# Patient Record
Sex: Female | Born: 1935 | Race: White | Hispanic: No | Marital: Married | Smoking: Former smoker
Health system: Southern US, Community
[De-identification: ages and names within clinical notes are randomized; demographics above are authoritative.]

## PROBLEM LIST (undated history)

## (undated) DIAGNOSIS — E785 Hyperlipidemia, unspecified: Secondary | ICD-10-CM

## (undated) DIAGNOSIS — C50919 Malignant neoplasm of unspecified site of unspecified female breast: Secondary | ICD-10-CM

## (undated) DIAGNOSIS — G5600 Carpal tunnel syndrome, unspecified upper limb: Secondary | ICD-10-CM

## (undated) DIAGNOSIS — Z8619 Personal history of other infectious and parasitic diseases: Secondary | ICD-10-CM

## (undated) DIAGNOSIS — K219 Gastro-esophageal reflux disease without esophagitis: Secondary | ICD-10-CM

## (undated) DIAGNOSIS — I10 Essential (primary) hypertension: Secondary | ICD-10-CM

## (undated) DIAGNOSIS — I639 Cerebral infarction, unspecified: Secondary | ICD-10-CM

## (undated) DIAGNOSIS — E039 Hypothyroidism, unspecified: Secondary | ICD-10-CM

## (undated) DIAGNOSIS — I2510 Atherosclerotic heart disease of native coronary artery without angina pectoris: Secondary | ICD-10-CM

## (undated) HISTORY — PX: OTHER SURGICAL HISTORY: SHX169

## (undated) HISTORY — PX: APPENDECTOMY: SHX54

## (undated) HISTORY — PX: ABDOMINAL HYSTERECTOMY: SHX81

---

## 1958-02-17 DIAGNOSIS — C50919 Malignant neoplasm of unspecified site of unspecified female breast: Secondary | ICD-10-CM

## 1958-02-17 HISTORY — DX: Malignant neoplasm of unspecified site of unspecified female breast: C50.919

## 2004-02-07 ENCOUNTER — Ambulatory Visit: Payer: Self-pay | Admitting: Internal Medicine

## 2004-04-12 ENCOUNTER — Ambulatory Visit: Payer: Self-pay | Admitting: Gastroenterology

## 2005-07-14 ENCOUNTER — Ambulatory Visit: Payer: Self-pay | Admitting: Internal Medicine

## 2006-07-23 ENCOUNTER — Encounter: Payer: Self-pay | Admitting: Internal Medicine

## 2006-07-27 ENCOUNTER — Ambulatory Visit: Payer: Self-pay | Admitting: Internal Medicine

## 2007-09-08 ENCOUNTER — Ambulatory Visit: Payer: Self-pay | Admitting: Internal Medicine

## 2008-09-21 ENCOUNTER — Ambulatory Visit: Payer: Self-pay | Admitting: Internal Medicine

## 2009-09-26 ENCOUNTER — Ambulatory Visit: Payer: Self-pay | Admitting: Internal Medicine

## 2009-10-15 ENCOUNTER — Ambulatory Visit: Payer: Self-pay | Admitting: Gastroenterology

## 2010-10-16 ENCOUNTER — Ambulatory Visit: Payer: Self-pay | Admitting: Internal Medicine

## 2011-10-17 ENCOUNTER — Ambulatory Visit: Payer: Self-pay | Admitting: Internal Medicine

## 2012-11-18 ENCOUNTER — Ambulatory Visit: Payer: Self-pay | Admitting: Internal Medicine

## 2013-11-28 DIAGNOSIS — N189 Chronic kidney disease, unspecified: Secondary | ICD-10-CM | POA: Insufficient documentation

## 2013-11-28 DIAGNOSIS — Z8639 Personal history of other endocrine, nutritional and metabolic disease: Secondary | ICD-10-CM | POA: Insufficient documentation

## 2013-11-28 DIAGNOSIS — Z8679 Personal history of other diseases of the circulatory system: Secondary | ICD-10-CM | POA: Insufficient documentation

## 2013-11-28 DIAGNOSIS — K219 Gastro-esophageal reflux disease without esophagitis: Secondary | ICD-10-CM | POA: Insufficient documentation

## 2013-11-28 DIAGNOSIS — E785 Hyperlipidemia, unspecified: Secondary | ICD-10-CM | POA: Insufficient documentation

## 2013-12-27 ENCOUNTER — Ambulatory Visit: Payer: Self-pay | Admitting: Internal Medicine

## 2014-06-05 DIAGNOSIS — E034 Atrophy of thyroid (acquired): Secondary | ICD-10-CM | POA: Insufficient documentation

## 2016-01-31 ENCOUNTER — Encounter (INDEPENDENT_AMBULATORY_CARE_PROVIDER_SITE_OTHER): Payer: Self-pay

## 2016-01-31 ENCOUNTER — Ambulatory Visit (INDEPENDENT_AMBULATORY_CARE_PROVIDER_SITE_OTHER): Payer: Self-pay | Admitting: Vascular Surgery

## 2016-12-05 DIAGNOSIS — Z8659 Personal history of other mental and behavioral disorders: Secondary | ICD-10-CM | POA: Insufficient documentation

## 2017-10-12 ENCOUNTER — Observation Stay (HOSPITAL_COMMUNITY): Payer: Medicare PPO

## 2017-10-12 ENCOUNTER — Inpatient Hospital Stay (HOSPITAL_COMMUNITY)
Admission: EM | Admit: 2017-10-12 | Discharge: 2017-10-15 | DRG: 065 | Disposition: A | Discharge status: Skilled Nursing Facility | Payer: Medicare PPO | Attending: Family Medicine | Admitting: Family Medicine

## 2017-10-12 ENCOUNTER — Other Ambulatory Visit: Payer: Self-pay

## 2017-10-12 ENCOUNTER — Encounter (HOSPITAL_COMMUNITY): Payer: Self-pay | Admitting: Emergency Medicine

## 2017-10-12 ENCOUNTER — Emergency Department (HOSPITAL_COMMUNITY): Payer: Medicare PPO

## 2017-10-12 DIAGNOSIS — Z888 Allergy status to other drugs, medicaments and biological substances status: Secondary | ICD-10-CM

## 2017-10-12 DIAGNOSIS — I129 Hypertensive chronic kidney disease with stage 1 through stage 4 chronic kidney disease, or unspecified chronic kidney disease: Secondary | ICD-10-CM | POA: Diagnosis present

## 2017-10-12 DIAGNOSIS — I6340 Cerebral infarction due to embolism of unspecified cerebral artery: Secondary | ICD-10-CM

## 2017-10-12 DIAGNOSIS — N183 Chronic kidney disease, stage 3 (moderate): Secondary | ICD-10-CM | POA: Diagnosis present

## 2017-10-12 DIAGNOSIS — R296 Repeated falls: Secondary | ICD-10-CM | POA: Diagnosis present

## 2017-10-12 DIAGNOSIS — Z7982 Long term (current) use of aspirin: Secondary | ICD-10-CM

## 2017-10-12 DIAGNOSIS — R402252 Coma scale, best verbal response, oriented, at arrival to emergency department: Secondary | ICD-10-CM | POA: Diagnosis present

## 2017-10-12 DIAGNOSIS — R748 Abnormal levels of other serum enzymes: Secondary | ICD-10-CM

## 2017-10-12 DIAGNOSIS — G8194 Hemiplegia, unspecified affecting left nondominant side: Secondary | ICD-10-CM | POA: Diagnosis present

## 2017-10-12 DIAGNOSIS — I10 Essential (primary) hypertension: Secondary | ICD-10-CM | POA: Diagnosis not present

## 2017-10-12 DIAGNOSIS — I63513 Cerebral infarction due to unspecified occlusion or stenosis of bilateral middle cerebral arteries: Secondary | ICD-10-CM | POA: Diagnosis not present

## 2017-10-12 DIAGNOSIS — R0989 Other specified symptoms and signs involving the circulatory and respiratory systems: Secondary | ICD-10-CM | POA: Diagnosis present

## 2017-10-12 DIAGNOSIS — I160 Hypertensive urgency: Secondary | ICD-10-CM | POA: Diagnosis present

## 2017-10-12 DIAGNOSIS — I272 Pulmonary hypertension, unspecified: Secondary | ICD-10-CM | POA: Diagnosis present

## 2017-10-12 DIAGNOSIS — R29703 NIHSS score 3: Secondary | ICD-10-CM | POA: Diagnosis present

## 2017-10-12 DIAGNOSIS — R402142 Coma scale, eyes open, spontaneous, at arrival to emergency department: Secondary | ICD-10-CM | POA: Diagnosis present

## 2017-10-12 DIAGNOSIS — I639 Cerebral infarction, unspecified: Secondary | ICD-10-CM | POA: Diagnosis not present

## 2017-10-12 DIAGNOSIS — Z79890 Hormone replacement therapy (postmenopausal): Secondary | ICD-10-CM

## 2017-10-12 DIAGNOSIS — I248 Other forms of acute ischemic heart disease: Secondary | ICD-10-CM | POA: Diagnosis present

## 2017-10-12 DIAGNOSIS — R778 Other specified abnormalities of plasma proteins: Secondary | ICD-10-CM | POA: Diagnosis present

## 2017-10-12 DIAGNOSIS — R402362 Coma scale, best motor response, obeys commands, at arrival to emergency department: Secondary | ICD-10-CM | POA: Diagnosis present

## 2017-10-12 DIAGNOSIS — R1310 Dysphagia, unspecified: Secondary | ICD-10-CM | POA: Diagnosis present

## 2017-10-12 DIAGNOSIS — E039 Hypothyroidism, unspecified: Secondary | ICD-10-CM | POA: Diagnosis present

## 2017-10-12 DIAGNOSIS — I2510 Atherosclerotic heart disease of native coronary artery without angina pectoris: Secondary | ICD-10-CM | POA: Diagnosis present

## 2017-10-12 DIAGNOSIS — R531 Weakness: Secondary | ICD-10-CM

## 2017-10-12 DIAGNOSIS — F419 Anxiety disorder, unspecified: Secondary | ICD-10-CM | POA: Diagnosis present

## 2017-10-12 DIAGNOSIS — Z8249 Family history of ischemic heart disease and other diseases of the circulatory system: Secondary | ICD-10-CM

## 2017-10-12 DIAGNOSIS — R7989 Other specified abnormal findings of blood chemistry: Secondary | ICD-10-CM | POA: Diagnosis present

## 2017-10-12 DIAGNOSIS — Z79899 Other long term (current) drug therapy: Secondary | ICD-10-CM

## 2017-10-12 HISTORY — DX: Essential (primary) hypertension: I10

## 2017-10-12 LAB — DIFFERENTIAL
Abs Immature Granulocytes: 0 10*3/uL (ref 0.0–0.1)
BASOS ABS: 0.1 10*3/uL (ref 0.0–0.1)
Basophils Relative: 1 %
EOS PCT: 1 %
Eosinophils Absolute: 0.2 10*3/uL (ref 0.0–0.7)
Immature Granulocytes: 0 %
LYMPHS PCT: 13 %
Lymphs Abs: 1.6 10*3/uL (ref 0.7–4.0)
MONO ABS: 0.9 10*3/uL (ref 0.1–1.0)
MONOS PCT: 7 %
NEUTROS PCT: 78 %
Neutro Abs: 9.8 10*3/uL — ABNORMAL HIGH (ref 1.7–7.7)

## 2017-10-12 LAB — PROTIME-INR
INR: 1.03
Prothrombin Time: 13.4 seconds (ref 11.4–15.2)

## 2017-10-12 LAB — COMPREHENSIVE METABOLIC PANEL
ALT: 17 U/L (ref 0–44)
ANION GAP: 9 (ref 5–15)
AST: 27 U/L (ref 15–41)
Albumin: 3.5 g/dL (ref 3.5–5.0)
Alkaline Phosphatase: 74 U/L (ref 38–126)
BILIRUBIN TOTAL: 1.1 mg/dL (ref 0.3–1.2)
BUN: 19 mg/dL (ref 8–23)
CO2: 26 mmol/L (ref 22–32)
Calcium: 9.2 mg/dL (ref 8.9–10.3)
Chloride: 106 mmol/L (ref 98–111)
Creatinine, Ser: 1 mg/dL (ref 0.44–1.00)
GFR, EST AFRICAN AMERICAN: 59 mL/min — AB (ref >60)
GFR, EST NON AFRICAN AMERICAN: 51 mL/min — AB (ref >60)
Glucose, Bld: 112 mg/dL — ABNORMAL HIGH (ref 70–99)
Potassium: 4.3 mmol/L (ref 3.5–5.1)
SODIUM: 141 mmol/L (ref 135–145)
TOTAL PROTEIN: 6.7 g/dL (ref 6.5–8.1)

## 2017-10-12 LAB — URINALYSIS, ROUTINE W REFLEX MICROSCOPIC
Bilirubin Urine: NEGATIVE
Glucose, UA: NEGATIVE mg/dL
Hgb urine dipstick: NEGATIVE
Ketones, ur: NEGATIVE mg/dL
LEUKOCYTES UA: NEGATIVE
Nitrite: NEGATIVE
Protein, ur: NEGATIVE mg/dL
SPECIFIC GRAVITY, URINE: 1.009 (ref 1.005–1.030)
pH: 8 (ref 5.0–8.0)

## 2017-10-12 LAB — CBC
HEMATOCRIT: 45.5 % (ref 36.0–46.0)
Hemoglobin: 13.9 g/dL (ref 12.0–15.0)
MCH: 28 pg (ref 26.0–34.0)
MCHC: 30.5 g/dL (ref 30.0–36.0)
MCV: 91.7 fL (ref 78.0–100.0)
Platelets: 221 10*3/uL (ref 150–400)
RBC: 4.96 MIL/uL (ref 3.87–5.11)
RDW: 13.3 % (ref 11.5–15.5)
WBC: 12.7 10*3/uL — ABNORMAL HIGH (ref 4.0–10.5)

## 2017-10-12 LAB — I-STAT TROPONIN, ED: TROPONIN I, POC: 0.1 ng/mL — AB (ref 0.00–0.08)

## 2017-10-12 LAB — RAPID URINE DRUG SCREEN, HOSP PERFORMED
Amphetamines: NOT DETECTED
BARBITURATES: NOT DETECTED
Benzodiazepines: NOT DETECTED
Cocaine: NOT DETECTED
Opiates: NOT DETECTED
Tetrahydrocannabinol: NOT DETECTED

## 2017-10-12 LAB — APTT: APTT: 30 s (ref 24–36)

## 2017-10-12 LAB — ETHANOL

## 2017-10-12 MED ORDER — SENNOSIDES-DOCUSATE SODIUM 8.6-50 MG PO TABS: 1.0000 | ORAL_TABLET | Freq: Every evening | ORAL | Status: DC | PRN

## 2017-10-12 MED ORDER — ACETAMINOPHEN 650 MG RE SUPP: 650.0000 mg | RECTAL | Status: DC | PRN

## 2017-10-12 MED ORDER — LABETALOL HCL 5 MG/ML IV SOLN: 5.0000 mg | INTRAVENOUS | Status: DC | PRN

## 2017-10-12 MED ORDER — SIMVASTATIN 40 MG PO TABS: 40.0000 mg | ORAL_TABLET | Freq: Every day | ORAL | Status: DC

## 2017-10-12 MED ORDER — STROKE: EARLY STAGES OF RECOVERY BOOK
Freq: Once | Status: AC
  Administered 2017-10-13
  Filled 2017-10-12 (×2): qty 1

## 2017-10-12 MED ORDER — VITAMIN D 1000 UNITS PO TABS
3000.0000 [IU] | ORAL_TABLET | Freq: Every day | ORAL | Status: DC
  Administered 2017-10-13 – 2017-10-14 (×2): 3000 [IU] via ORAL
  Filled 2017-10-12 (×2): qty 3

## 2017-10-12 MED ORDER — LEVOTHYROXINE SODIUM 25 MCG PO TABS
25.0000 ug | ORAL_TABLET | Freq: Every day | ORAL | Status: DC
  Administered 2017-10-13 – 2017-10-15 (×3): 25 ug via ORAL
  Filled 2017-10-12 (×3): qty 1

## 2017-10-12 MED ORDER — PANTOPRAZOLE SODIUM 20 MG PO TBEC
20.0000 mg | DELAYED_RELEASE_TABLET | Freq: Every day | ORAL | Status: DC
  Administered 2017-10-13 – 2017-10-15 (×3): 20 mg via ORAL
  Filled 2017-10-12 (×3): qty 1

## 2017-10-12 MED ORDER — ASPIRIN 325 MG PO TABS
325.0000 mg | ORAL_TABLET | Freq: Every day | ORAL | Status: DC
  Administered 2017-10-13 – 2017-10-15 (×3): 325 mg via ORAL
  Filled 2017-10-12 (×3): qty 1

## 2017-10-12 MED ORDER — VENLAFAXINE HCL ER 150 MG PO CP24
150.0000 mg | ORAL_CAPSULE | Freq: Every day | ORAL | Status: DC
  Administered 2017-10-13 – 2017-10-14 (×2): 150 mg via ORAL
  Filled 2017-10-12: qty 1
  Filled 2017-10-12 (×2): qty 2
  Filled 2017-10-12 (×2): qty 1

## 2017-10-12 MED ORDER — BACITRACIN-NEOMYCIN-POLYMYXIN OINTMENT TUBE
1.0000 "application " | TOPICAL_OINTMENT | Freq: Every day | CUTANEOUS | Status: DC
  Administered 2017-10-13 – 2017-10-15 (×3): 1 via TOPICAL
  Filled 2017-10-12 (×2): qty 14

## 2017-10-12 MED ORDER — LABETALOL HCL 5 MG/ML IV SOLN
10.0000 mg | Freq: Once | INTRAVENOUS | Status: AC
  Administered 2017-10-12: 10 mg via INTRAVENOUS
  Filled 2017-10-12: qty 4

## 2017-10-12 MED ORDER — ACETAMINOPHEN 325 MG PO TABS
650.0000 mg | ORAL_TABLET | ORAL | Status: DC | PRN
  Administered 2017-10-13: 650 mg via ORAL
  Filled 2017-10-12 (×3): qty 2

## 2017-10-12 MED ORDER — SODIUM CHLORIDE 0.9 % IV SOLN
INTRAVENOUS | Status: DC
  Administered 2017-10-12: via INTRAVENOUS

## 2017-10-12 MED ORDER — ACETAMINOPHEN 160 MG/5ML PO SOLN: 650.0000 mg | ORAL | Status: DC | PRN

## 2017-10-12 MED ORDER — LABETALOL HCL 5 MG/ML IV SOLN: 20.0000 mg | Freq: Once | INTRAVENOUS | Status: DC

## 2017-10-12 MED ORDER — ASPIRIN 300 MG RE SUPP: 300.0000 mg | Freq: Every day | RECTAL | Status: DC

## 2017-10-12 MED ORDER — ENOXAPARIN SODIUM 40 MG/0.4ML ~~LOC~~ SOLN
40.0000 mg | SUBCUTANEOUS | Status: DC
  Administered 2017-10-13 – 2017-10-14 (×3): 40 mg via SUBCUTANEOUS
  Filled 2017-10-12 (×3): qty 0.4

## 2017-10-12 NOTE — ED Notes (Signed)
Patient transported to CT 

## 2017-10-12 NOTE — ED Triage Notes (Signed)
Per EMS, pt fell out of bed around 0730 and hit her head and left elbow. Around 1330 pt started to experience left sided facial; droop and slurred speech. Pt stated that she has been generally weak over the last week. Pt is hypertensive open arrival 252/113

## 2017-10-12 NOTE — ED Notes (Signed)
Spoke to MD Opyd about keeping pt's BP high due to possibility of acute stroke. No new orders for BP meds at this time.

## 2017-10-12 NOTE — H&P (Signed)
History and Physical    Aleyssa Pike TZG:[id number] DOB: 1932-03-26 DOA: 10/12/2017  PCP: Baxter Hire, MD   Patient coming from: Home   Chief Complaint: Left-sided weakness   HPI: Svetlana Bagby is a 84 y.o. female with medical history significant for hypothyroidism, hypertension, and anxiety, now presenting to the emergency department with left-sided weakness.  Patient reports that she has been generally weak and fatigued for the past week or so, but was not noted to have a left facial droop or focal weakness until earlier today.  Family had noted at approximately 1:30 PM that the patient had a left facial droop with dysarthria and appeared to be dragging her left leg.  Patient denies any chest pain, palpitations, or shortness of breath.  She had a fall out of her bed early this morning without any significant injury or loss of consciousness.  ED Course: Upon arrival to the ED, patient is found to be afebrile, saturating well on room air, hypertensive to 210/80, and vitals otherwise normal. EKG features a sinus rhythm and noncontrast head CT is negative for acute intracranial abnormality. Chemistry panel is unremarkable and CBC is notable for a leukocytosis to 12,700. Troponin is elevated to 0.10. Patient was given a dose of IV labetalol in the ED and neurology was consulted by the ED physician. Patient will be observed for ongoing evaluation and management of left-sided weakness suspected secondary to stroke.  Review of Systems:  All other systems reviewed and apart from HPI, are negative.  Past Medical History:  Diagnosis Date  . Hypertension     History reviewed. No pertinent surgical history.   has no tobacco, alcohol, and drug history on file.  Allergies  Allergen Reactions  . Clopidogrel Nausea And Vomiting     reported by [place] 11/24/13  . Omeprazole Other (See Comments)    Unknown reaction - reported by [place] 11/24/13     Family History  Problem Relation Age of Onset  . Hypertension Son      Prior to Admission medications   Medication Sig Start Date End Date Taking? Authorizing Provider  aspirin EC 81 MG tablet Take 162 mg by mouth at bedtime.    Yes [provider]  b complex vitamins tablet Take 1 tablet by mouth daily.   Yes [provider]  benazepril (LOTENSIN) 20 MG tablet Take 20 mg by mouth at bedtime.  09/10/17  Yes [provider]  Cholecalciferol (VITAMIN D3) 1000 units CAPS Take 3,000 Units by mouth at bedtime.  09/07/17  Yes [provider]  cloNIDine (CATAPRES) 0.1 MG tablet Take 0.2 mg by mouth at bedtime.  09/10/17  Yes [provider]  Coenzyme Q10 (COQ10) 100 MG CAPS Take 100 mg by mouth daily.    Yes [provider]  ibuprofen (ADVIL,MOTRIN) 200 MG tablet Take 800 mg by mouth every 6 (six) hours as needed (back pain).    Yes [provider]  lansoprazole (PREVACID) 15 MG capsule Take 15 mg by mouth at bedtime.    Yes [provider]  levothyroxine (SYNTHROID, LEVOTHROID) 25 MCG tablet Take 25 mcg by mouth daily before breakfast.  09/10/17  Yes [provider]  metoprolol succinate (TOPROL-XL) 100 MG 24 hr tablet Take 100 mg by mouth at bedtime.  09/10/17  Yes [provider]  neomycin-bacitracin-polymyxin (NEOSPORIN) [id number] ointment Apply 1 application topically daily. Apply to wounds on legs   Yes [provider]  Phenazopyridine HCl (AZO  STANDARD MAXIMUM STRENGTH PO) Take 1 capsule by mouth at bedtime.    Yes [provider]  simvastatin (ZOCOR) 40 MG tablet Take 40 mg by mouth at bedtime. 09/10/17  Yes [provider]  venlafaxine XR (EFFEXOR-XR) 150 MG 24 hr capsule Take 150 mg by mouth at bedtime. 09/10/17  Yes [provider]    Physical Exam: Vitals:   10/12/17 2052 10/12/17 2100 10/12/17 2210 10/12/17 2215  BP: (!) 188/97 (!) 211/83 (!) 217/70 (!) 213/91   Pulse: 87 88 81 80  Resp: (!) 23 20 18 19   Temp:      TempSrc:      SpO2: 95% 97% 97% 97%  Weight:      Height:          Constitutional: NAD, calm  Eyes: PERTLA, lids and conjunctivae normal ENMT: Mucous membranes are moist. Posterior pharynx clear of any exudate or lesions.   Neck: normal, supple, no masses, no thyromegaly Respiratory: clear to auscultation bilaterally, no wheezing, no crackles. Normal respiratory effort.   Cardiovascular: S1 & S2 heard, regular rate and rhythm. No extremity edema.   Abdomen: No distension, no tenderness, soft. Bowel sounds normal.  Musculoskeletal: no clubbing / cyanosis. No joint deformity upper and lower extremities.    Skin: Skin atrophy and hyperpigmentation in distal LE's bilateral with chronic ulcerations, Lt >Rt. Warm, dry, well-perfused. Neurologic: Left lower facial weakness, PERRL, EOMI. Strength 5/5 throughout RUE and RLE, 2-3/5 in left arm, 3-4/5 involving LLE.  Psychiatric: Alert and oriented x 3. Pleasant, cooperative.     Labs on Admission: I have personally reviewed following labs and imaging studies  CBC: Recent Labs  Lab 10/12/17 2034  WBC 12.7*  NEUTROABS 9.8*  HGB 13.9  HCT 45.5  MCV 91.7  PLT 361   Basic Metabolic Panel: Recent Labs  Lab 10/12/17 2034  NA 141  K 4.3  CL 106  CO2 26  GLUCOSE 112*  BUN 19  CREATININE 1.00  CALCIUM 9.2   GFR: Estimated Creatinine Clearance: 33.8 mL/min (by C-G formula based on SCr of 1 mg/dL). Liver Function Tests: Recent Labs  Lab 10/12/17 2034  AST 27  ALT 17  ALKPHOS 74  BILITOT 1.1  PROT 6.7  ALBUMIN 3.5   No results for input(s): LIPASE, AMYLASE in the last 168 hours. No results for input(s): AMMONIA in the last 168 hours. Coagulation Profile: Recent Labs  Lab 10/12/17 2034  INR 1.03   Cardiac Enzymes: No results for input(s): CKTOTAL, CKMB, CKMBINDEX, TROPONINI in the last 168 hours. BNP (last 3 results) No results for input(s): PROBNP in the last  8760 hours. HbA1C: No results for input(s): HGBA1C in the last 72 hours. CBG: No results for input(s): GLUCAP in the last 168 hours. Lipid Profile: No results for input(s): CHOL, HDL, LDLCALC, TRIG, CHOLHDL, LDLDIRECT in the last 72 hours. Thyroid Function Tests: No results for input(s): TSH, T4TOTAL, FREET4, T3FREE, THYROIDAB in the last 72 hours. Anemia Panel: No results for input(s): VITAMINB12, FOLATE, FERRITIN, TIBC, IRON, RETICCTPCT in the last 72 hours. Urine analysis:    Component Value Date/Time   COLORURINE STRAW (A) 10/12/2017 [place] 10/12/2017 2203   LABSPEC 1.009 10/12/2017 2203   PHURINE 8.0 10/12/2017 [place] 10/12/2017 2203   HGBUR NEGATIVE 10/12/2017 2203   BILIRUBINUR NEGATIVE 10/12/2017 2203   KETONESUR NEGATIVE 10/12/2017 2203   PROTEINUR NEGATIVE 10/12/2017 2203   NITRITE NEGATIVE 10/12/2017 2203   LEUKOCYTESUR NEGATIVE 10/12/2017 2203  Sepsis Labs: @LABRCNTIP (FFMBWGYKZLDJT:7,SVXBLTJQZES:9) )No results found for this or any previous visit (from the past 240 hour(s)).   Radiological Exams on Admission: Ct Head Wo Contrast  Result Date: 10/12/2017 CLINICAL DATA:  Hit head.  Fall. EXAM: CT HEAD WITHOUT CONTRAST TECHNIQUE: Contiguous axial images were obtained from the base of the skull through the vertex without intravenous contrast. COMPARISON:  None. FINDINGS: Brain: There is atrophy and chronic small vessel disease changes. No acute intracranial abnormality. Specifically, no hemorrhage, hydrocephalus, mass lesion, acute infarction, or significant intracranial injury. Vascular: No hyperdense vessel or unexpected calcification. Skull: No acute calvarial abnormality. Sinuses/Orbits: Visualized paranasal sinuses and mastoids clear. Orbital soft tissues unremarkable. Other: None IMPRESSION: No acute intracranial abnormality. Atrophy, chronic microvascular disease. Electronically Signed   By: Rolm Baptise M.D.   On: 10/12/2017 20:53     EKG: Independently reviewed. Sinus rhythm.   Assessment/Plan   1. Suspected stroke; left-sided weakness  - Presents with left-sided weakness that developed some time today before 13:30 per family, thought she has had generalized weakness and fatigue for the past week  - Head CT is negative for acute intracranial abnormality  - Neurology is consulting and much appreciated, will follow-up on recommendations  - Continue cardiac monitoring with frequent neuro checks, PT/OT/SLP evals, swallow screen  - Check MRI brain, A1c, and fasting lipids  - Continue ASA and statin   2. Hypertension with hypertensive urgency  - SBP elevated to low 200's in ED  - She is managed with benazepril, Toprol, and clonidine at home  - Plan to permit HTN for now in light of suspicion for acute ischemic CVA, treat pressures >220/120    3. Elevated troponin  - Troponin is elevated to 0.10 in ED - No chest pain, no acute ischemic features appreciated on EKG  - Continue cardiac monitoring, trend troponin, continue ASA and statin    4. Hypothyroidism  - Check TSH given reports of gen weakness and fatigue for past week  - Continue Synthroid    5. Anxiety  - Continue Effexor    DVT prophylaxis: Lovenox  Code Status: Full  Family Communication: Son and husband updated at bedside Consults called: Neurology  Admission status: Observation     Vianne Bulls, MD [facility] Pager [phone]  If 7PM-7AM, please contact night-coverage [email or web address] Password [facility]  10/12/2017, 10:44 PM

## 2017-10-12 NOTE — ED Provider Notes (Signed)
[facility] EMERGENCY DEPARTMENT Provider Note   CSN: [id number] Arrival date & time:        History   Chief Complaint Chief Complaint  Patient presents with  . Stroke Symptoms    HPI Sonya [name] is a 84 y.o. female.  HPI Patient presented to the emergency room for evaluation of weakness, fatigue.  Patient states over the last couple of days she has had some episodes off and on and feeling very fatigued and weak all over.  Patient fell out of bed today at about 730.  She states she hit her head and her elbow but she was unable to get up out of bed and required her husband to assist her.  According to the EMS report the patient started having a left-sided facial droop and slurred speech at about 1:30 PM today.  Patient herself states her symptoms started over the last couple days and she does not specifically remember having any acute change at 130 this afternoon.  Currently she does not have any trouble with chest pain or shortness of breath. Past Medical History:  Diagnosis Date  . Hypertension     Patient Active Problem List   Diagnosis Date Noted  . Hypertension 10/12/2017  . Hypertensive urgency 10/12/2017  . Hypothyroidism 10/12/2017  . Left-sided weakness 10/12/2017  . Elevated troponin 10/12/2017  . Unknown when suspected stroke patient was last well 10/12/2017    History reviewed. No pertinent surgical history.   OB History   None      Home Medications    Prior to Admission medications   Medication Sig Start Date End Date Taking? Authorizing Provider  aspirin EC 81 MG tablet Take 162 mg by mouth at bedtime.    Yes [provider]  b complex vitamins tablet Take 1 tablet by mouth daily.   Yes [provider]  benazepril (LOTENSIN) 20 MG tablet Take 20 mg by mouth at bedtime.  09/10/17  Yes [provider]  Cholecalciferol (VITAMIN D3) 1000 units CAPS Take 3,000 Units by mouth at bedtime.  09/07/17  Yes [provider]  cloNIDine (CATAPRES) 0.1 MG tablet Take 0.2 mg by mouth at bedtime.  09/10/17  Yes [provider]  Coenzyme Q10 (COQ10) 100 MG CAPS Take 100 mg by mouth daily.    Yes [provider]  ibuprofen (ADVIL,MOTRIN) 200 MG tablet Take 800 mg by mouth every 6 (six) hours as needed (back pain).    Yes [provider]  lansoprazole (PREVACID) 15 MG capsule Take 15 mg by mouth at bedtime.    Yes [provider]  levothyroxine (SYNTHROID, LEVOTHROID) 25 MCG tablet Take 25 mcg by mouth daily before breakfast.  09/10/17  Yes [provider]  metoprolol succinate (TOPROL-XL) 100 MG 24 hr tablet Take 100 mg by mouth at bedtime.  09/10/17  Yes [provider]  neomycin-bacitracin-polymyxin (NEOSPORIN) [phone] ointment Apply 1 application topically daily. Apply to wounds on legs   Yes [provider]  Phenazopyridine HCl (AZO STANDARD MAXIMUM STRENGTH PO) Take 1 capsule by mouth at bedtime.    Yes [provider]  simvastatin (ZOCOR) 40 MG tablet Take 40 mg by mouth at bedtime. 09/10/17  Yes [provider]  venlafaxine XR (EFFEXOR-XR) 150 MG 24 hr capsule Take 150 mg by mouth at bedtime. 09/10/17  Yes [provider]    Family History History reviewed. No pertinent family history.  Social History Social History   Tobacco Use  .  Smoking status: Not on file  Substance Use Topics  . Alcohol use: Not on file  . Drug use: Not on file     Allergies   Clopidogrel and Omeprazole   Review of Systems Review of Systems  All other systems reviewed and are negative.    Physical Exam Updated Vital Signs BP (!) 211/83   Pulse 88   Temp 97.9 F (36.6 C) (Oral)   Resp 20   Ht 1.524 m (5')   Wt 57.2 kg   LMP  (Exact Date)   SpO2 97%   BMI 24.61 kg/m   Physical Exam  Constitutional: She is oriented to person, place, and time. She appears well-developed and well-nourished. No distress.  HENT:    Head: Normocephalic and atraumatic.  Right Ear: External ear normal.  Left Ear: External ear normal.  Mouth/Throat: Oropharynx is clear and moist.  Eyes: Conjunctivae are normal. Right eye exhibits no discharge. Left eye exhibits no discharge. No scleral icterus.  Neck: Neck supple. No tracheal deviation present.  Cardiovascular: Normal rate, regular rhythm and intact distal pulses.  Pulmonary/Chest: Effort normal and breath sounds normal. No stridor. No respiratory distress. She has no wheezes. She has no rales.  Abdominal: Soft. Bowel sounds are normal. She exhibits no distension. There is no tenderness. There is no rebound and no guarding.  Musculoskeletal: She exhibits no edema or tenderness.  Neurological: She is alert and oriented to person, place, and time. A cranial nerve deficit (Sided facial droop, extraocular movements intact, ) is present. No sensory deficit. She exhibits normal muscle tone. She displays no seizure activity.  Weakness in the left upper extremity compared to the right, difficulty holding the left leg off bed for 5 seconds, sensation intact in all extremities, no visual field cuts, no left or right sided neglect, normal finger-nose exam bilaterally, no nystagmus noted   Skin: Skin is warm and dry. No rash noted.  Psychiatric: She has a normal mood and affect.  Nursing note and vitals reviewed.    ED Treatments / Results  Labs (all labs ordered are listed, but only abnormal results are displayed) Labs Reviewed  CBC - Abnormal; Notable for the following components:      Result Value   WBC 12.7 (*)    All other components within normal limits  DIFFERENTIAL - Abnormal; Notable for the following components:   Neutro Abs 9.8 (*)    All other components within normal limits  COMPREHENSIVE METABOLIC PANEL - Abnormal; Notable for the following components:   Glucose, Bld 112 (*)    GFR calc non Af Amer 51 (*)    GFR calc Af Amer 59 (*)    All other components  within normal limits  I-STAT TROPONIN, ED - Abnormal; Notable for the following components:   Troponin i, poc 0.10 (*)    All other components within normal limits  ETHANOL  PROTIME-INR  APTT  RAPID URINE DRUG SCREEN, HOSP PERFORMED  URINALYSIS, ROUTINE W REFLEX MICROSCOPIC    EKG EKG Interpretation  Date/Time:  Monday October 12 2017 19:53:15 EDT Ventricular Rate:  91 PR Interval:    QRS Duration: 71 QT Interval:  371 QTC Calculation: 457 R Axis:   -13 Text Interpretation:  Sinus rhythm Left atrial enlargement Minimal ST depression, lateral leads No old tracing to compare Confirmed by Dorie Rank [phone]) on 10/12/2017 9:39:37 PM   Radiology Ct Head Wo Contrast  Result Date: 10/12/2017 CLINICAL DATA:  Hit head.  Fall. EXAM: CT HEAD  WITHOUT CONTRAST TECHNIQUE: Contiguous axial images were obtained from the base of the skull through the vertex without intravenous contrast. COMPARISON:  None. FINDINGS: Brain: There is atrophy and chronic small vessel disease changes. No acute intracranial abnormality. Specifically, no hemorrhage, hydrocephalus, mass lesion, acute infarction, or significant intracranial injury. Vascular: No hyperdense vessel or unexpected calcification. Skull: No acute calvarial abnormality. Sinuses/Orbits: Visualized paranasal sinuses and mastoids clear. Orbital soft tissues unremarkable. Other: None IMPRESSION: No acute intracranial abnormality. Atrophy, chronic microvascular disease. Electronically Signed   By: Rolm Baptise M.D.   On: 10/12/2017 20:53    Procedures .Critical Care Performed by: Dorie Rank, MD Authorized by: Dorie Rank, MD   Critical care provider statement:    Critical care time (minutes):  45   Critical care was time spent personally by me on the following activities:  Discussions with consultants, evaluation of patient's response to treatment, examination of patient, ordering and performing treatments and interventions, ordering and review of  laboratory studies, ordering and review of radiographic studies, pulse oximetry, re-evaluation of patient's condition, obtaining history from patient or surrogate and review of old charts   (including critical care time)  Medications Ordered in ED Medications  labetalol (NORMODYNE,TRANDATE) injection 10 mg (10 mg Intravenous Given 10/12/17 2051)     Initial Impression / Assessment and Plan / ED Course  I have reviewed the triage vital signs and the nursing notes.  Pertinent labs & imaging results that were available during my care of the patient were reviewed by me and considered in my medical decision making (see chart for details).   Patient presented to the emergency room for evaluation of a possible stroke.  The exact onset is unclear.  Patient mentions having symptoms for the last few days.  Patient is outside of TPA window.  She has been negative.  Labs notable for a slight increase in troponin but no complaints of chest pain or symptoms to suggest acute cardiac injury.  Patient is hypertensive.  I gave her a dose of labetalol but will hold off on aggressive measures right now as of the acute stroke concern.  We will consult the medical service for admission and consult with neuro hospitalist.  Final Clinical Impressions(s) / ED Diagnoses   Final diagnoses:  Cerebrovascular accident (CVA), unspecified mechanism ([place])  Hypertension, unspecified type      Dorie Rank, MD 10/12/17 2208

## 2017-10-12 NOTE — ED Notes (Signed)
ED Provider at bedside. 

## 2017-10-13 ENCOUNTER — Observation Stay (HOSPITAL_COMMUNITY): Payer: Medicare PPO

## 2017-10-13 DIAGNOSIS — R0989 Other specified symptoms and signs involving the circulatory and respiratory systems: Secondary | ICD-10-CM | POA: Diagnosis not present

## 2017-10-13 DIAGNOSIS — R402142 Coma scale, eyes open, spontaneous, at arrival to emergency department: Secondary | ICD-10-CM | POA: Diagnosis present

## 2017-10-13 DIAGNOSIS — I63511 Cerebral infarction due to unspecified occlusion or stenosis of right middle cerebral artery: Secondary | ICD-10-CM | POA: Diagnosis not present

## 2017-10-13 DIAGNOSIS — R29703 NIHSS score 3: Secondary | ICD-10-CM | POA: Diagnosis present

## 2017-10-13 DIAGNOSIS — I639 Cerebral infarction, unspecified: Secondary | ICD-10-CM | POA: Diagnosis present

## 2017-10-13 DIAGNOSIS — N183 Chronic kidney disease, stage 3 (moderate): Secondary | ICD-10-CM | POA: Diagnosis present

## 2017-10-13 DIAGNOSIS — I160 Hypertensive urgency: Secondary | ICD-10-CM

## 2017-10-13 DIAGNOSIS — R402362 Coma scale, best motor response, obeys commands, at arrival to emergency department: Secondary | ICD-10-CM | POA: Diagnosis present

## 2017-10-13 DIAGNOSIS — Z7982 Long term (current) use of aspirin: Secondary | ICD-10-CM | POA: Diagnosis not present

## 2017-10-13 DIAGNOSIS — R1310 Dysphagia, unspecified: Secondary | ICD-10-CM | POA: Diagnosis present

## 2017-10-13 DIAGNOSIS — F419 Anxiety disorder, unspecified: Secondary | ICD-10-CM | POA: Diagnosis present

## 2017-10-13 DIAGNOSIS — R296 Repeated falls: Secondary | ICD-10-CM | POA: Diagnosis present

## 2017-10-13 DIAGNOSIS — Z8249 Family history of ischemic heart disease and other diseases of the circulatory system: Secondary | ICD-10-CM | POA: Diagnosis not present

## 2017-10-13 DIAGNOSIS — I129 Hypertensive chronic kidney disease with stage 1 through stage 4 chronic kidney disease, or unspecified chronic kidney disease: Secondary | ICD-10-CM | POA: Diagnosis present

## 2017-10-13 DIAGNOSIS — I248 Other forms of acute ischemic heart disease: Secondary | ICD-10-CM | POA: Diagnosis present

## 2017-10-13 DIAGNOSIS — Z888 Allergy status to other drugs, medicaments and biological substances status: Secondary | ICD-10-CM | POA: Diagnosis not present

## 2017-10-13 DIAGNOSIS — G8194 Hemiplegia, unspecified affecting left nondominant side: Secondary | ICD-10-CM | POA: Diagnosis present

## 2017-10-13 DIAGNOSIS — I2510 Atherosclerotic heart disease of native coronary artery without angina pectoris: Secondary | ICD-10-CM | POA: Diagnosis present

## 2017-10-13 DIAGNOSIS — I6340 Cerebral infarction due to embolism of unspecified cerebral artery: Secondary | ICD-10-CM

## 2017-10-13 DIAGNOSIS — E039 Hypothyroidism, unspecified: Secondary | ICD-10-CM | POA: Diagnosis present

## 2017-10-13 DIAGNOSIS — Z79899 Other long term (current) drug therapy: Secondary | ICD-10-CM | POA: Diagnosis not present

## 2017-10-13 DIAGNOSIS — Z79890 Hormone replacement therapy (postmenopausal): Secondary | ICD-10-CM | POA: Diagnosis not present

## 2017-10-13 DIAGNOSIS — I272 Pulmonary hypertension, unspecified: Secondary | ICD-10-CM | POA: Diagnosis present

## 2017-10-13 DIAGNOSIS — I63513 Cerebral infarction due to unspecified occlusion or stenosis of bilateral middle cerebral arteries: Secondary | ICD-10-CM | POA: Diagnosis present

## 2017-10-13 DIAGNOSIS — R402252 Coma scale, best verbal response, oriented, at arrival to emergency department: Secondary | ICD-10-CM | POA: Diagnosis present

## 2017-10-13 LAB — HEMOGLOBIN A1C
Hgb A1c MFr Bld: 5.9 % — ABNORMAL HIGH (ref 4.8–5.6)
Mean Plasma Glucose: 122.63 mg/dL

## 2017-10-13 LAB — ECHOCARDIOGRAM COMPLETE
HEIGHTINCHES: 60 in
WEIGHTICAEL: 1978.85 [oz_av]

## 2017-10-13 LAB — LIPID PANEL
CHOL/HDL RATIO: 3.8 ratio
Cholesterol: 145 mg/dL (ref 0–200)
HDL: 38 mg/dL — AB (ref >40)
LDL CALC: 76 mg/dL (ref 0–99)
Triglycerides: 156 mg/dL — ABNORMAL HIGH (ref <150)
VLDL: 31 mg/dL (ref 0–40)

## 2017-10-13 LAB — TROPONIN I
TROPONIN I: 0.13 ng/mL — AB (ref <0.03)
TROPONIN I: 0.09 ng/mL — AB (ref <0.03)
Troponin I: 0.15 ng/mL (ref <0.03)

## 2017-10-13 MED ORDER — ATORVASTATIN CALCIUM 80 MG PO TABS: 80.0000 mg | ORAL_TABLET | Freq: Every day | ORAL | Status: DC

## 2017-10-13 MED ORDER — ONDANSETRON HCL 4 MG/2ML IJ SOLN
4.0000 mg | Freq: Four times a day (QID) | INTRAMUSCULAR | Status: DC | PRN
  Administered 2017-10-13 – 2017-10-14 (×2): 4 mg via INTRAVENOUS
  Filled 2017-10-13 (×2): qty 2

## 2017-10-13 MED ORDER — IOPAMIDOL (ISOVUE-370) INJECTION 76%
50.0000 mL | Freq: Once | INTRAVENOUS | Status: AC | PRN
  Administered 2017-10-13: 50 mL via INTRAVENOUS

## 2017-10-13 MED ORDER — SIMVASTATIN 40 MG PO TABS
40.0000 mg | ORAL_TABLET | Freq: Every day | ORAL | Status: DC
  Administered 2017-10-13 – 2017-10-14 (×2): 40 mg via ORAL
  Filled 2017-10-13 (×2): qty 1

## 2017-10-13 MED ORDER — BENAZEPRIL HCL 20 MG PO TABS: 20.0000 mg | ORAL_TABLET | Freq: Every day | ORAL | Status: DC

## 2017-10-13 MED ORDER — BENAZEPRIL HCL 20 MG PO TABS
20.0000 mg | ORAL_TABLET | Freq: Every day | ORAL | Status: DC
  Administered 2017-10-13 – 2017-10-15 (×3): 20 mg via ORAL
  Filled 2017-10-13 (×3): qty 1

## 2017-10-13 MED ORDER — B COMPLEX-C PO TABS
1.0000 | ORAL_TABLET | Freq: Every day | ORAL | Status: DC
  Administered 2017-10-13 – 2017-10-15 (×3): 1 via ORAL
  Filled 2017-10-13 (×3): qty 1

## 2017-10-13 MED ORDER — COQ10 100 MG PO CAPS: 100.0000 mg | ORAL_CAPSULE | Freq: Every day | ORAL | Status: DC

## 2017-10-13 MED ORDER — CLONIDINE HCL 0.2 MG PO TABS: 0.2000 mg | ORAL_TABLET | Freq: Every day | ORAL | Status: DC

## 2017-10-13 MED ORDER — METOPROLOL TARTRATE 25 MG PO TABS
25.0000 mg | ORAL_TABLET | Freq: Two times a day (BID) | ORAL | Status: DC
  Administered 2017-10-13 – 2017-10-15 (×5): 25 mg via ORAL
  Filled 2017-10-13 (×5): qty 1

## 2017-10-13 MED ORDER — CLONIDINE HCL 0.2 MG PO TABS
0.2000 mg | ORAL_TABLET | Freq: Two times a day (BID) | ORAL | Status: DC
  Administered 2017-10-13 – 2017-10-15 (×5): 0.2 mg via ORAL
  Filled 2017-10-13 (×5): qty 1

## 2017-10-13 MED ORDER — IOPAMIDOL (ISOVUE-370) INJECTION 76%
INTRAVENOUS | Status: AC
  Filled 2017-10-13: qty 50

## 2017-10-13 NOTE — Progress Notes (Signed)
CRITICAL VALUE ALERT  Critical Value:  Troponin 0.13  Date & Time Notied:  10/13/17 0312  Provider Notified: K Schorr   Orders Received/Actions taken: No new orders

## 2017-10-13 NOTE — Evaluation (Signed)
Physical Therapy Evaluation Patient Details Name: Sonya [name] MRN: [id number] DOB: 02/04/1933 Today's Date: 10/13/2017   History of Present Illness  84 year old female with history of essential hypertension, hypothyroidism, CKD stage III, CAD came to the hospital for evaluation of left-sided weakness as well as left-sided facial droop.  Per family this is somewhat progressed over the course of last week therefore she was not a candidate for TPA.  Initial CT of the head was negative but MRI of the brain was positive which showed early subacute right posterior insula, fracture operculum and posterior lateral lobe infarct.   Clinical Impression  Patient presents with decreased independence with mobility due to deficits listed in PT problem list.  She was previously living with her spouse completing all ADL/IADL's and currently is +2 A for mobility.  She will benefit from skilled PT in the acute setting to maximize mobility allow d/c to STSNF level rehab.      Follow Up Recommendations SNF;Supervision/Assistance - 24 hour    Equipment Recommendations  Other (comment)(TBA in next venue)    Recommendations for Other Services       Precautions / Restrictions Precautions Precautions: Fall      Mobility  Bed Mobility Overal bed mobility: Needs Assistance Bed Mobility: Rolling;Sidelying to Sit;Sit to Supine Rolling: Mod assist Sidelying to sit: Mod assist;+2 for physical assistance;HOB elevated   Sit to supine: Max assist;+2 for safety/equipment   General bed mobility comments: assist and cues for technique and reaching L UE across body to roll; for side to sit assist for L LE and trunk and to scoot to EOB; sit to supine assist for trunk and LE's  Transfers Overall transfer level: Needs assistance Equipment used: None Transfers: Sit to/from Stand Sit to Stand: Max assist;+2 physical assistance         General transfer comment: lifting assist from EOB; assist for balance,  anterior weight shift and R lateral weight shift, able to support weight on L LE momentarily  Ambulation/Gait             General Gait Details: deferred due to safety  Stairs            Wheelchair Mobility    Modified Rankin (Stroke Patients Only) Modified Rankin (Stroke Patients Only) Pre-Morbid Rankin Score: No symptoms Modified Rankin: Severe disability     Balance Overall balance assessment: Needs assistance Sitting-balance support: Feet supported Sitting balance-Leahy Scale: Zero Sitting balance - Comments: max A for balance at EOB and cues for head righting, up to 3-4 minutes   Standing balance support: Single extremity supported Standing balance-Leahy Scale: Zero Standing balance comment: stood x 2 by EOB with +2 A for balance leaning to L and with head and neck flexed throughout                             Pertinent Vitals/Pain Faces Pain Scale: Hurts little more Pain Location: back Pain Descriptors / Indicators: Aching Pain Intervention(s): Repositioned    Home Living Family/patient expects to be discharged to:: Skilled nursing facility   Available Help at Discharge: Family Type of Home: House                Prior Function Level of Independence: Independent with assistive device(s)         Comments: multiple falls     Hand Dominance   Dominant Hand: Right    Extremity/Trunk Assessment   Upper Extremity Assessment Upper Extremity Assessment:  Defer to OT evaluation    Lower Extremity Assessment Lower Extremity Assessment: RLE deficits/detail;LLE deficits/detail RLE Deficits / Details: AROM WFL, strength at least 4/5 LLE Deficits / Details: PROM WFL, no active movement in supine, able to activate into gravity for weight bearing    Cervical / Trunk Assessment Cervical / Trunk Assessment: Other exceptions;Kyphotic Cervical / Trunk Exceptions: L lateral lean; unable to sit unsupported at midline; forward head   Communication   Communication: Expressive difficulties(dysarthria)  Cognition Arousal/Alertness: Awake/alert Behavior During Therapy: Flat affect Overall Cognitive Status: Impaired/Different from baseline Area of Impairment: Attention;Memory;Following commands;Safety/judgement;Awareness;Problem solving                   Current Attention Level: Sustained Memory: Decreased short-term memory Following Commands: Follows one step commands with increased time Safety/Judgement: Decreased awareness of safety;Decreased awareness of deficits Awareness: Intellectual Problem Solving: Slow processing;Decreased initiation;Requires verbal cues;Requires tactile cues        General Comments General comments (skin integrity, edema, etc.): no family present and pt lethargic as she reports up in chair earlier.     Exercises     Assessment/Plan    PT Assessment Patient needs continued PT services  PT Problem List Decreased strength;Decreased mobility;Decreased activity tolerance;Decreased balance;Decreased knowledge of use of DME;Decreased cognition;Decreased knowledge of precautions;Decreased safety awareness       PT Treatment Interventions DME instruction;Therapeutic activities;Patient/family education;Therapeutic exercise;Balance training;Neuromuscular re-education;Functional mobility training;Gait training    PT Goals (Current goals can be found in the Care Plan section)  Acute Rehab PT Goals Patient Stated Goal: to get stronger PT Goal Formulation: With patient Time For Goal Achievement: 10/27/17 Potential to Achieve Goals: Fair    Frequency Min 3X/week   Barriers to discharge        Co-evaluation               AM-PAC PT "6 Clicks" Daily Activity  Outcome Measure Difficulty turning over in bed (including adjusting bedclothes, sheets and blankets)?: Unable Difficulty moving from lying on back to sitting on the side of the bed? : Unable Difficulty sitting down on and  standing up from a chair with arms (e.g., wheelchair, bedside commode, etc,.)?: Unable Help needed moving to and from a bed to chair (including a wheelchair)?: Total Help needed walking in hospital room?: Total Help needed climbing 3-5 steps with a railing? : Total 6 Click Score: 6    End of Session Equipment Utilized During Treatment: Gait belt Activity Tolerance: Patient limited by lethargy Patient left: in bed;with call bell/phone within reach;with bed alarm set   PT Visit Diagnosis: Other abnormalities of gait and mobility (R26.89);Hemiplegia and hemiparesis;Other symptoms and signs involving the nervous system (R29.898) Hemiplegia - Right/Left: Left Hemiplegia - dominant/non-dominant: Non-dominant Hemiplegia - caused by: Cerebral infarction    Time: 7654-6503 PT Time Calculation (min) (ACUTE ONLY): 22 min   Charges:   PT Evaluation $PT Eval Moderate Complexity: [place], [place] [phone] 10/13/2017   Reginia Naas 10/13/2017, 4:08 PM

## 2017-10-13 NOTE — Progress Notes (Signed)
PROGRESS NOTE    Sonya [name]  XBM:[id number] DOB: 23-Jun-1932 DOA: 10/12/2017 PCP: Gracelyn Nurse, MD   Brief Narrative:  84 year old female with history of essential hypertension, hypothyroidism, CKD stage III, CAD came to the hospital for evaluation of left-sided weakness as well as left-sided facial droop.  Per family this is somewhat progressed over the course of last week therefore she was not a candidate for TPA.  Initial CT of the head was negative but MRI of the brain was positive which showed early subacute right posterior insula, fracture operculum and posterior lateral lobe infarct.  Currently getting routine stroke evaluation.   Assessment & Plan:   Principal Problem:   Unknown when suspected stroke patient was last well Active Problems:   Hypertension   Hypertensive urgency   Hypothyroidism   Left-sided weakness   Elevated troponin   Cerebral embolism with cerebral infarction  Acute right posterior insula, fracture operculum and posterior lateral lobe infarct Lleft-sided hemiparesis and left-sided facial droop, persist -CT the head negative but CTA head and neck showed severe stenosis of bilateral vertebral artery, right proximal ICA less than 50%, left proximal ICA 50-70%, left subclavian greater than 70%.  MRI of the brain was positive for CVA.  Neurology team following. - Hemoglobin A1c 5.9, LDL 76 -Echocardiogram has been done-results pending - Continue patient's aspirin, should she may also be on Plavix for 3 weeks?.  Will defer this to stroke team.  She also had low-dose statin. - PT/OT/speech and swallow. -Permissive hypertension and normalize her blood pressure over next 5-7 days.  Essential hypertension, uncontrolled -Currently systolic is around 200 due to permissive hypertension.  When appropriate will resume BenzePrO, Toprol and clonidine.  Elevated troponin - Likely demand ischemia.  No evidence of ischemia on the EKG, she remains chest pain-free.   Troponins are trended down now.  Hypothyroidism -Continue Synthroid. TSH - pending.    DVT prophylaxis: Lovenox Code Status: Full  Family Communication:  Son and Husband at Bedside  Disposition Plan: TBD  Consultants:   Neurology  Procedures:   None   Antimicrobials:   None    Subjective: Still has left sided weakness and facial droop. No other complaints.   Review of Systems Otherwise negative except as per HPI, including: General = no fevers, chills, dizziness, malaise, fatigue HEENT/EYES = negative for pain, redness, loss of vision, double vision, blurred vision, loss of hearing, sore throat, hoarseness, dysphagia Cardiovascular= negative for chest pain, palpitation, murmurs, lower extremity swelling Respiratory/lungs= negative for shortness of breath, cough, hemoptysis, wheezing, mucus production Gastrointestinal= negative for nausea, vomiting,, abdominal pain, melena, hematemesis Genitourinary= negative for Dysuria, Hematuria, Change in Urinary Frequency MSK = Negative for arthralgia, myalgias, Back Pain, Joint swelling  Neurology= Negative for headache, seizures, numbness, tingling  Psychiatry= Negative for anxiety, depression, suicidal and homocidal ideation Allergy/Immunology= Medication/Food allergy as listed  Skin= Negative for Rash, lesions, ulcers, itching   Objective: Vitals:   10/13/17 0439 10/13/17 0600 10/13/17 0907 10/13/17 0909  BP: (!) 215/71 (!) 211/69 (!) 218/81 (!) 218/81  Pulse: 70 66 70 70  Resp:      Temp:      TempSrc:      SpO2:      Weight:      Height:        Intake/Output Summary (Last 24 hours) at 10/13/2017 1037 Last data filed at 10/13/2017 0414 Gross per 24 hour  Intake 53.41 ml  Output -  Net 53.41 ml   [employer]  10/12/17 1953 10/12/17 2351  Weight: 57.2 kg 56.1 kg    Examination:  General exam: Appears calm and comfortable  Respiratory system: Clear to auscultation. Respiratory effort normal. Cardiovascular  system: S1 & S2 heard, RRR. No JVD, murmurs, rubs, gallops or clicks. No pedal edema. Gastrointestinal system: Abdomen is nondistended, soft and nontender. No organomegaly or masses felt. Normal bowel sounds heard. Central nervous system: Alert and oriented x4; left side facial droop noted. Mild slurred speech Extremities: Left sided strength 3/5, right sided 4+/5,  Skin: No rashes, lesions or ulcers Psychiatry: Judgement and insight appear normal. Mood & affect appropriate.     Data Reviewed:   CBC: Recent Labs  Lab 10/12/17 2034  WBC 12.7*  NEUTROABS 9.8*  HGB 13.9  HCT 45.5  MCV 91.7  PLT 221   Basic Metabolic Panel: Recent Labs  Lab 10/12/17 2034  NA 141  K 4.3  CL 106  CO2 26  GLUCOSE 112*  BUN 19  CREATININE 1.00  CALCIUM 9.2   GFR: Estimated Creatinine Clearance: 33.4 mL/min (by C-G formula based on SCr of 1 mg/dL). Liver Function Tests: Recent Labs  Lab 10/12/17 2034  AST 27  ALT 17  ALKPHOS 74  BILITOT 1.1  PROT 6.7  ALBUMIN 3.5   No results for input(s): LIPASE, AMYLASE in the last 168 hours. No results for input(s): AMMONIA in the last 168 hours. Coagulation Profile: Recent Labs  Lab 10/12/17 2034  INR 1.03   Cardiac Enzymes: Recent Labs  Lab 10/13/17 0217 10/13/17 0611  TROPONINI 0.13* 0.15*   BNP (last 3 results) No results for input(s): PROBNP in the last 8760 hours. HbA1C: Recent Labs    10/13/17 0217  HGBA1C 5.9*   CBG: No results for input(s): GLUCAP in the last 168 hours. Lipid Profile: Recent Labs    10/13/17 0217  CHOL 145  HDL 38*  LDLCALC 76  TRIG 161*  CHOLHDL 3.8   Thyroid Function Tests: No results for input(s): TSH, T4TOTAL, FREET4, T3FREE, THYROIDAB in the last 72 hours. Anemia Panel: No results for input(s): VITAMINB12, FOLATE, FERRITIN, TIBC, IRON, RETICCTPCT in the last 72 hours. Sepsis Labs: No results for input(s): PROCALCITON, LATICACIDVEN in the last 168 hours.  No results found for this or any  previous visit (from the past 240 hour(s)).       Radiology Studies: Ct Angio Head W Or Wo Contrast  Result Date: 10/13/2017 CLINICAL DATA:  84 y/o F; left-sided facial droop and slurred speech. EXAM: CT ANGIOGRAPHY HEAD AND NECK TECHNIQUE: Multidetector CT imaging of the head and neck was performed using the standard protocol during bolus administration of intravenous contrast. Multiplanar CT image reconstructions and MIPs were obtained to evaluate the vascular anatomy. Carotid stenosis measurements (when applicable) are obtained utilizing NASCET criteria, using the distal internal carotid diameter as the denominator. CONTRAST:  50mL ISOVUE-370 IOPAMIDOL (ISOVUE-370) INJECTION 76% COMPARISON:  10/12/2017 CT head.  10/13/2017 MRI head. FINDINGS: CTA NECK FINDINGS Aortic arch: Bovine variant branching. Imaged portion shows no evidence of aneurysm or dissection. Left subclavian artery extensive calcified plaque with moderate to severe 70% proximal stenosis. Right carotid system: No evidence of dissection, stenosis (50% or greater) or occlusion. Extensive calcified plaque of the carotid bifurcation with mild less than 50% proximal ICA stenosis. Left carotid system: No evidence of dissection or occlusion. Severe calcified plaque of the left carotid bifurcation with moderate 50-70% proximal ICA stenosis. Vertebral arteries: Calcified plaque of the bilateral vertebral arteries with severe stenosis of the vertebral  artery origins bilaterally. Mild calcific atherosclerosis of the left V1 segment. Skeleton: Moderate cervical spondylosis with disc and facet degenerative changes greatest at the C4-C7 levels. No high-grade bony canal stenosis. Other neck: Negative. Upper chest: Negative. Review of the MIP images confirms the above findings CTA HEAD FINDINGS Anterior circulation: Right MCA distribution mid to distal M2 branch occlusion within the mid sylvian fissure (series 12, image 10). No additional large vessel  occlusion, aneurysm, or high-grade stenosis. Calcified plaque of the carotid siphons with mild less than 50% paraclinoid stenosis. Posterior circulation: No significant stenosis, proximal occlusion, aneurysm, or vascular malformation. Venous sinuses: As permitted by contrast timing, patent. Anatomic variants: Complete circle-of-Willis. Delayed phase: No abnormal intracranial enhancement. Review of the MIP images confirms the above findings IMPRESSION: 1. Right mid to distal M2 branch occlusion within mid sylvian fissure. 2. No additional intracranial large vessel occlusion, aneurysm, or significant stenosis. 3. Bilateral vertebral artery origin severe stenosis with calcified plaque. 4. Right proximal ICA mild less than 50% stenosis with calcified plaque. 5. Left proximal ICA moderate 50-70% stenosis with calcified plaque. 6. Left subclavian artery severe greater than 70% proximal stenosis with calcified plaque. These results will be called to the ordering clinician or representative by the Radiologist Assistant, and communication documented in the PACS or zVision Dashboard. Electronically Signed   By: Mitzi Hansen M.D.   On: 10/13/2017 03:41   Dg Chest 2 View  Result Date: 10/12/2017 CLINICAL DATA:  84 y/o F; patient presents with left-sided weakness tonight. Back pain from multiple intra discs. EXAM: CHEST - 2 VIEW COMPARISON:  None. FINDINGS: Mild cardiomegaly given projection and technique. Calcific aortic atherosclerosis. No consolidation, effusion, or pneumothorax. Lower thoracic, probably T12 and L1 mild anterior compression deformities, age indeterminate. IMPRESSION: Lower thoracic, probably T12-L1 mild anterior compression deformities, age indeterminate. No acute pulmonary process identified. Electronically Signed   By: Mitzi Hansen M.D.   On: 10/12/2017 23:17   Ct Head Wo Contrast  Result Date: 10/12/2017 CLINICAL DATA:  Hit head.  Fall. EXAM: CT HEAD WITHOUT CONTRAST  TECHNIQUE: Contiguous axial images were obtained from the base of the skull through the vertex without intravenous contrast. COMPARISON:  None. FINDINGS: Brain: There is atrophy and chronic small vessel disease changes. No acute intracranial abnormality. Specifically, no hemorrhage, hydrocephalus, mass lesion, acute infarction, or significant intracranial injury. Vascular: No hyperdense vessel or unexpected calcification. Skull: No acute calvarial abnormality. Sinuses/Orbits: Visualized paranasal sinuses and mastoids clear. Orbital soft tissues unremarkable. Other: None IMPRESSION: No acute intracranial abnormality. Atrophy, chronic microvascular disease. Electronically Signed   By: Charlett Nose M.D.   On: 10/12/2017 20:53   Ct Angio Neck W Or Wo Contrast  Result Date: 10/13/2017 CLINICAL DATA:  84 y/o F; left-sided facial droop and slurred speech. EXAM: CT ANGIOGRAPHY HEAD AND NECK TECHNIQUE: Multidetector CT imaging of the head and neck was performed using the standard protocol during bolus administration of intravenous contrast. Multiplanar CT image reconstructions and MIPs were obtained to evaluate the vascular anatomy. Carotid stenosis measurements (when applicable) are obtained utilizing NASCET criteria, using the distal internal carotid diameter as the denominator. CONTRAST:  50mL ISOVUE-370 IOPAMIDOL (ISOVUE-370) INJECTION 76% COMPARISON:  10/12/2017 CT head.  10/13/2017 MRI head. FINDINGS: CTA NECK FINDINGS Aortic arch: Bovine variant branching. Imaged portion shows no evidence of aneurysm or dissection. Left subclavian artery extensive calcified plaque with moderate to severe 70% proximal stenosis. Right carotid system: No evidence of dissection, stenosis (50% or greater) or occlusion. Extensive calcified plaque of the carotid  bifurcation with mild less than 50% proximal ICA stenosis. Left carotid system: No evidence of dissection or occlusion. Severe calcified plaque of the left carotid bifurcation  with moderate 50-70% proximal ICA stenosis. Vertebral arteries: Calcified plaque of the bilateral vertebral arteries with severe stenosis of the vertebral artery origins bilaterally. Mild calcific atherosclerosis of the left V1 segment. Skeleton: Moderate cervical spondylosis with disc and facet degenerative changes greatest at the C4-C7 levels. No high-grade bony canal stenosis. Other neck: Negative. Upper chest: Negative. Review of the MIP images confirms the above findings CTA HEAD FINDINGS Anterior circulation: Right MCA distribution mid to distal M2 branch occlusion within the mid sylvian fissure (series 12, image 10). No additional large vessel occlusion, aneurysm, or high-grade stenosis. Calcified plaque of the carotid siphons with mild less than 50% paraclinoid stenosis. Posterior circulation: No significant stenosis, proximal occlusion, aneurysm, or vascular malformation. Venous sinuses: As permitted by contrast timing, patent. Anatomic variants: Complete circle-of-Willis. Delayed phase: No abnormal intracranial enhancement. Review of the MIP images confirms the above findings IMPRESSION: 1. Right mid to distal M2 branch occlusion within mid sylvian fissure. 2. No additional intracranial large vessel occlusion, aneurysm, or significant stenosis. 3. Bilateral vertebral artery origin severe stenosis with calcified plaque. 4. Right proximal ICA mild less than 50% stenosis with calcified plaque. 5. Left proximal ICA moderate 50-70% stenosis with calcified plaque. 6. Left subclavian artery severe greater than 70% proximal stenosis with calcified plaque. These results will be called to the ordering clinician or representative by the Radiologist Assistant, and communication documented in the PACS or zVision Dashboard. Electronically Signed   By: Mitzi Hansen M.D.   On: 10/13/2017 03:41   Mr Brain Wo Contrast  Result Date: 10/13/2017 CLINICAL DATA:  84 y/o  F; left-sided weakness. EXAM: MRI HEAD  WITHOUT CONTRAST TECHNIQUE: Multiplanar, multiecho pulse sequences of the brain and surrounding structures were obtained without intravenous contrast. COMPARISON:  10/12/2017 CT head. FINDINGS: Brain: Reduced diffusion within the right posterior insula, frontal operculum, and posterolateral frontal lobe compatible with acute/early subacute infarction. No associated hemorrhage or mass effect. Several nonspecific T2 FLAIR hyperintensities in subcortical and periventricular white matter are compatible with mild chronic microvascular ischemic changes for age. Mild volume loss of the brain. Vascular: Linear focus of susceptibility blooming measuring 7 mm in the right sylvian fissure, likely a vessel thrombus (series 10, image 29). Skull and upper cervical spine: Normal marrow signal. Sinuses/Orbits: Negative.  Bilateral intra-ocular lens replacement. Other: None. IMPRESSION: 1. Acute/early subacute infarction involving the right posterior insula, frontal operculum, and posterolateral frontal lobe. No hemorrhage or mass effect. 2. 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 3. Mild for age chronic microvascular ischemic changes and volume loss of the brain. These results will be called to the ordering clinician or representative by the Radiologist Assistant, and communication documented in the PACS or zVision Dashboard. Electronically Signed   By: Mitzi Hansen M.D.   On: 10/13/2017 01:42        Scheduled Meds: . aspirin  300 mg Rectal Daily   Or  . aspirin  325 mg Oral Daily  . B-complex with vitamin C  1 tablet Oral Daily  . benazepril  20 mg Oral Daily  . cholecalciferol  3,000 Units Oral QHS  . cloNIDine  0.2 mg Oral BID  . enoxaparin (LOVENOX) injection  40 mg Subcutaneous Q24H  . iopamidol      . levothyroxine  25 mcg Oral QAC  breakfast  . metoprolol tartrate  25 mg Oral BID  . neomycin-bacitracin-polymyxin  1  application Topical Daily  . pantoprazole  20 mg Oral Daily  . venlafaxine XR  150 mg Oral QHS   Continuous Infusions:   LOS: 0 days    I have spent 35 minutes face to face with the patient and on the ward discussing the patients care, assessment, plan and disposition with other care givers. >50% of the time was devoted counseling the patient about the risks and benefits of treatment and coordinating care.     Ansen Sayegh Joline Maxcy, MD [facility] Pager [phone]   If 7PM-7AM, please contact night-coverage [email or web address] Password [facility] 10/13/2017, 10:37 AM

## 2017-10-13 NOTE — Evaluation (Signed)
Speech Language Pathology Evaluation Patient Details Name: Sonya [name] MRN: [id number] DOB: [birth year] Today's Date: 10/13/2017 Time: 0940-1010 SLP Time Calculation (min) (ACUTE ONLY): 30 min  Problem List:  Patient Active Problem List   Diagnosis Date Noted  . Cerebral embolism with cerebral infarction 10/13/2017  . Hypertension 10/12/2017  . Hypertensive urgency 10/12/2017  . Hypothyroidism 10/12/2017  . Left-sided weakness 10/12/2017  . Elevated troponin 10/12/2017  . Unknown when suspected stroke patient was last well 10/12/2017   Past Medical History:  Past Medical History:  Diagnosis Date  . Hypertension    Past Surgical History: History reviewed. No pertinent surgical history. HPI:  Sonya [name] is a 84 y.o. female with medical history significant for hypothyroidism, hypertension, and anxiety, who presentied to the emergency department with left-sided weakness.  Patient reports that she has been generally weak and fatigued for the past week or so, family had noted that the patient had a left facial droop with dysarthria and appeared to be dragging her left leg.    She had a fall out of her bed early this morning without any significant injury or loss of consciousness.  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 No hemorrhage or mass effect.   Assessment / Plan / Recommendation Clinical Impression   Pt presents with a mild-moderate dysarthria characterized by imprecise articulation of consonants resulting from left sided oral motor weakness.  This impacts intelligibility at the sentence level.  Pt endorses baseline memory deficits but was still able to manage her medications and finances independently.  Pt had difficulty completing functional paper and pen math calculations which is not typical for her.  As a result, pt would benefit from skilled ST targeting goals for speech  intelligibility and cognition.      SLP Assessment  SLP Recommendation/Assessment: Patient needs continued Speech Lanaguage Pathology Services SLP Visit Diagnosis: Dysarthria and anarthria (R47.1);Cognitive communication deficit (R41.841)    Follow Up Recommendations  Other (comment);Inpatient Rehab(intermittent supervision)    Frequency and Duration min 2x/week         SLP Evaluation Cognition  Overall Cognitive Status: Impaired/Different from baseline Arousal/Alertness: Awake/alert Orientation Level: Oriented X4 Attention: Selective Selective Attention: Appears intact Memory: Impaired Memory Impairment: Other (comment);Retrieval deficit(working memory) Awareness: Appears intact Problem Solving: Impaired Problem Solving Impairment: Functional complex Executive Function: Self Monitoring;Self Correcting Self Monitoring: Impaired Self Monitoring Impairment: Functional complex Self Correcting: Impaired Self Correcting Impairment: Functional complex Safety/Judgment: Appears intact       Comprehension  Auditory Comprehension Overall Auditory Comprehension: Appears within functional limits for tasks assessed    Expression Expression Primary Mode of Expression: Verbal Verbal Expression Overall Verbal Expression: Appears within functional limits for tasks assessed Written Expression Dominant Hand: Right   Oral / Motor  Oral Motor/Sensory Function Overall Oral Motor/Sensory Function: Mild impairment Facial ROM: Reduced left Facial Symmetry: Abnormal symmetry left Facial Strength: Reduced left Lingual ROM: Reduced left Lingual Symmetry: Abnormal symmetry left Lingual Strength: Reduced Motor Speech Respiration: Within functional limits Phonation: Normal Resonance: Within functional limits Articulation: Impaired Level of Impairment: Sentence Intelligibility: Intelligibility reduced Sentence: 75-100% accurate Motor Planning: Witnin functional limits   GO                     Sonya [name], Sonya [name] 10/13/2017, 10:20 AM

## 2017-10-13 NOTE — Consult Note (Addendum)
Neurology Consultation  Reason for Consult: left sided weakness Referring Physician: Dr. Verneita Griffes, Dr. Elly Modena  CC: left sided weakness  History is obtained from: pt, chart  HPI: Sonya [name] is a 84 y.o. female PMH of HTN, hypothyroidism, CKD 3, self reported blockages in the heart vessels, who was last normal per her own history around 1 week ago when she started noticing left sided weakness. She reports left arm and leg weakness as well as left facial droop. She waited to call her PCP to make an appointment and did not seek emergent medical attention. According to the chart, she had a fall this afternoon from her bed, that is what prompted the visit to the emergency room.  On initial evaluation in the ER, she was noted to have left-sided facial droop and left-sided weakness along with dysarthria.  She was admitted for stroke work-up. She was outside the TPA window given the last known normal half about a week.  But the family reported at some point documented in the chart that the last known normal was 1:30 PM. Patient says that she has been having weakness in the left side for 1 week, when asked on repeated occasions. She denies any preceding sicknesses or illnesses.  No fevers chills.  No shortness of breath chest pain nausea vomiting.   LKW: 1 week ago tpa given?: no, outside the window for TPA Premorbid modified Rankin scale (mRS): 2-3  ROS:ROS was performed and is negative except as noted in the HPI.   Past Medical History:  Diagnosis Date  . Hypertension     Family History  Problem Relation Age of Onset  . Hypertension Son     Social History:   has no tobacco, alcohol, and drug history on file.   Medications  Current Facility-Administered Medications:  .   stroke: mapping our early stages of recovery book, , Does not apply, Once, Opyd, Timothy S, MD .  0.9 %  sodium chloride infusion, , Intravenous, Continuous, Opyd, Ilene Qua, MD, Last Rate: 90 mL/hr at  10/12/17 2351 .  acetaminophen (TYLENOL) tablet 650 mg, 650 mg, Oral, Q4H PRN **OR** acetaminophen (TYLENOL) solution 650 mg, 650 mg, Per Tube, Q4H PRN **OR** acetaminophen (TYLENOL) suppository 650 mg, 650 mg, Rectal, Q4H PRN, Opyd, Timothy S, MD .  aspirin suppository 300 mg, 300 mg, Rectal, Daily **OR** aspirin tablet 325 mg, 325 mg, Oral, Daily, Opyd, Timothy S, MD .  cholecalciferol (VITAMIN D) tablet 3,000 Units, 3,000 Units, Oral, QHS, Opyd, Timothy S, MD .  enoxaparin (LOVENOX) injection 40 mg, 40 mg, Subcutaneous, Q24H, Opyd, Timothy S, MD .  labetalol (NORMODYNE,TRANDATE) injection 5-10 mg, 5-10 mg, Intravenous, Q2H PRN, Opyd, Ilene Qua, MD .  levothyroxine (SYNTHROID, LEVOTHROID) tablet 25 mcg, 25 mcg, Oral, QAC breakfast, Opyd, Ilene Qua, MD .  neomycin-bacitracin-polymyxin (NEOSPORIN) ointment 1 application, 1 application, Topical, Daily, Opyd, Timothy S, MD .  pantoprazole (PROTONIX) EC tablet 20 mg, 20 mg, Oral, Daily, Opyd, Timothy S, MD .  senna-docusate (Senokot-S) tablet 1 tablet, 1 tablet, Oral, QHS PRN, Opyd, Ilene Qua, MD .  simvastatin (ZOCOR) tablet 40 mg, 40 mg, Oral, q1800, Opyd, Timothy S, MD .  venlafaxine XR (EFFEXOR-XR) 24 hr capsule 150 mg, 150 mg, Oral, QHS, Opyd, Ilene Qua, MD  Exam: Current vital signs: BP (!) 203/68   Pulse 71   Temp 97.9 F (36.6 C) (Oral)   Resp (!) 21   Ht 5' (1.524 m)   Wt 56.1 kg   LMP  (Exact Date)  SpO2 95%   BMI 24.15 kg/m  Vital signs in last 24 hours: Temp:  [97.9 F (36.6 C)] 97.9 F (36.6 C) (08/26 1953) Pulse Rate:  [71-90] 71 (08/26 2315) Resp:  [18-23] 21 (08/26 2315) BP: (178-217)/(68-154) 203/68 (08/26 2315) SpO2:  [95 %-100 %] 95 % (08/26 2315) Weight:  [56.1 kg-57.2 kg] 56.1 kg (08/26 2351) General: Patient awake alert no distress HEENT: Normocephalic atraumatic dry oral mucous membranes Lungs: Clear to auscultation Cardiovascular: S1-S2 heard, regular rate rhythm Abdomen: Soft nondistended  nontender Extremities: Warm and well-perfused with some bleeding spots on the right leg.  Hyperpigmentation in both lower extremities with some chronic venous stasis changes. Neurological exam Patient is awake, alert, oriented x3. Her speech is moderately dysarthric. Naming, repetition and comprehension are intact. She is able to follow multistep commands. She is able to provide mostly reliable history. Cranial nerves: Pupils are equal round reactive to light, extraocular movements are intact, visual fields are full on confrontation, left lower facial weakness noted at rest and upon trying to smile, there is some weakness in the left eyelid closure as well, facial sensation is also diminished on the left but the exam is unreliable, tongue midline, palate elevates symmetrically. Motor exam: Left upper extremity is 3/5, left lower extremity is 4/5-both of them show vertical drift.  Right upper and lower extremities are normal strength at 5/5. Sensory exam: Initially said that she did not feel any difference on light touch on either side but repleted detailed exam showed decreased sensation on the left when compared to the right.  With her eyes closed, she could not tell me when I touched her left side and also reported appreciating touch on the right side when I was actually touching both her legs. Coordination: Intact finger-nose-finger on the right, impaired by weakness on the left. Gait testing was deferred at this time.  NIHSS-5  Labs I have reviewed labs in epic and the results pertinent to this consultation are: CBC    Component Value Date/Time   WBC 12.7 (H) 10/12/2017 2034   RBC 4.96 10/12/2017 2034   HGB 13.9 10/12/2017 2034   HCT 45.5 10/12/2017 2034   PLT 221 10/12/2017 2034   MCV 91.7 10/12/2017 2034   MCH 28.0 10/12/2017 2034   MCHC 30.5 10/12/2017 2034   RDW 13.3 10/12/2017 2034   LYMPHSABS 1.6 10/12/2017 2034   MONOABS 0.9 10/12/2017 2034   EOSABS 0.2 10/12/2017 2034    BASOSABS 0.1 10/12/2017 2034    CMP     Component Value Date/Time   NA 141 10/12/2017 2034   K 4.3 10/12/2017 2034   CL 106 10/12/2017 2034   CO2 26 10/12/2017 2034   GLUCOSE 112 (H) 10/12/2017 2034   BUN 19 10/12/2017 2034   CREATININE 1.00 10/12/2017 2034   CALCIUM 9.2 10/12/2017 2034   PROT 6.7 10/12/2017 2034   ALBUMIN 3.5 10/12/2017 2034   AST 27 10/12/2017 2034   ALT 17 10/12/2017 2034   ALKPHOS 74 10/12/2017 2034   BILITOT 1.1 10/12/2017 2034   GFRNONAA 51 (L) 10/12/2017 2034   GFRAA 59 (L) 10/12/2017 2034   Imaging I have reviewed the images obtained: CT-scan of the brain-noncontrast CT of the head shows no acute changes.   Assessment:  84 year old woman with past medical history of hypertension, hypothyroidism, anxiety and self-reported heart disease, on multiple antihypertensives, presenting with left-sided weakness of 7 days duration. There is documentation in the chart that family reported that the weakness started at 1:30  PM with the patient reports about a week's worth of left-sided weakness. On examination, she has left hemiparesis, decreased sensation on the left, dysarthria and left facial droop. Likely a small vessel lacunar infarct. She has been admitted for stroke work-up. Our recommendations are as below.  Impression: Acute to subacute stroke-likely small vessel etiology secondary to hypertension  Recommendations: Admit to telemetry Frequent neurochecks Aspirin 325 now and daily Atorvastatin 80 mg now and daily MRI of the brain without contrast CT angiogram head and neck 2D echocardiogram Fasting lipid panel Hemoglobin A1c PT, OT, speech therapy N.p.o. until cleared for swallow by bedside eval or formal swallow eval. Allow for permissive hypertension.  Treat blood pressure as needed only if systolic blood pressures above 220.  Blood pressure should be normalized upon discharge to a goal of 140/90.  Please page stroke NP/PA/MD (listed on AMION)   from 8am-4 pm as this patient will be followed by the stroke team at this point.  -- Amie Portland, MD [facility] Pager: [phone] If 7pm to 7am, please call on call as listed on AMION.  Addendum MRI of the brain completed. Acute-early subacute right posterior insula, frontal operculum and posterior lateral frontal lobe infarct with no hemorrhage or mass-effect.  Likely 7 mm focus of susceptibility in the right sylvian fissure likely MCA branch vessel thrombus. Given unclear last seen normal, and relatively low NIH stroke scale, will not pursue endovascular route at this time. Will reassess if patient worsens. Might need long-term cardiac monitoring as this might be embolic.  -- Amie Portland, MD [facility] Pager: [phone] If 7pm to 7am, please call on call as listed on AMION.

## 2017-10-13 NOTE — Progress Notes (Signed)
  Echocardiogram 2D Echocardiogram has been performed.  Jennette Dubin 10/13/2017, 8:52 AM

## 2017-10-13 NOTE — Progress Notes (Signed)
PHARMACIST - PHYSICIAN ORDER COMMUNICATION  CONCERNING: P&T Medication Policy on Herbal Medications  DESCRIPTION:  This patient's order for:  Co-q10  has been noted.  This product(s) is classified as an "herbal" or natural product. Due to a lack of definitive safety studies or FDA approval, nonstandard manufacturing practices, plus the potential risk of unknown drug-drug interactions while on inpatient medications, the Pharmacy and Therapeutics Committee does not permit the use of "herbal" or natural products of this type within .   ACTION TAKEN: The pharmacy department is unable to verify this order at this time and your patient has been informed of this safety policy. Please reevaluate patient's clinical condition at discharge and address if the herbal or natural product(s) should be resumed at that time.   

## 2017-10-13 NOTE — Progress Notes (Addendum)
STROKE TEAM PROGRESS NOTE   INTERVAL HISTORY Her RN is at the bedside.  RN said her husband and son were in earlier. Therapy recommends CIR. Pt up in a chair for a few hours, now very tired. She remains flaccid on the L with significant neglect. [name]. Has multiple ecchymotic areas on her arms and legs from being on aspirin.  Has RLE wounds d/t poor circulation. Per OT note, she had multiple recent falls. She reports a hx of palpitations without workup.  Vitals:   10/13/17 0329 10/13/17 0402 10/13/17 0439 10/13/17 0600  BP: (!) 234/91 (!) 221/78 (!) 215/71 (!) 211/69  Pulse: 78 77 70 66  Resp:      Temp:      TempSrc:      SpO2: 98% 100%    Weight:      Height:        CBC:  Recent Labs  Lab 10/12/17 2034  WBC 12.7*  NEUTROABS 9.8*  HGB 13.9  HCT 45.5  MCV 91.7  PLT 401    Basic Metabolic Panel:  Recent Labs  Lab 10/12/17 2034  NA 141  K 4.3  CL 106  CO2 26  GLUCOSE 112*  BUN 19  CREATININE 1.00  CALCIUM 9.2   Lipid Panel:     Component Value Date/Time   CHOL 145 10/13/2017 0217   TRIG 156 (H) 10/13/2017 0217   HDL 38 (L) 10/13/2017 0217   CHOLHDL 3.8 10/13/2017 0217   VLDL 31 10/13/2017 0217   LDLCALC 76 10/13/2017 0217   HgbA1c:  Lab Results  Component Value Date   HGBA1C 5.9 (H) 10/13/2017   Urine Drug Screen:     Component Value Date/Time   LABOPIA NONE DETECTED 10/12/2017 2203   COCAINSCRNUR NONE DETECTED 10/12/2017 2203   LABBENZ NONE DETECTED 10/12/2017 2203   AMPHETMU NONE DETECTED 10/12/2017 2203   THCU NONE DETECTED 10/12/2017 2203   LABBARB NONE DETECTED 10/12/2017 2203    Alcohol Level     Component Value Date/Time   ETH <10 10/12/2017 2034    IMAGING Ct Angio Head W Or Wo Contrast  Result Date: 10/13/2017 CLINICAL DATA:  84 y/o F; left-sided facial droop and slurred speech. EXAM: CT ANGIOGRAPHY HEAD AND NECK TECHNIQUE: Multidetector CT imaging of the head and neck was performed using the standard protocol during bolus  administration of intravenous contrast. Multiplanar CT image reconstructions and MIPs were obtained to evaluate the vascular anatomy. Carotid stenosis measurements (when applicable) are obtained utilizing NASCET criteria, using the distal internal carotid diameter as the denominator. CONTRAST:  36mL ISOVUE-370 IOPAMIDOL (ISOVUE-370) INJECTION 76% COMPARISON:  10/12/2017 CT head.  10/13/2017 MRI head. FINDINGS: CTA NECK FINDINGS Aortic arch: Bovine variant branching. Imaged portion shows no evidence of aneurysm or dissection. Left subclavian artery extensive calcified plaque with moderate to severe 70% proximal stenosis. Right carotid system: No evidence of dissection, stenosis (50% or greater) or occlusion. Extensive calcified plaque of the carotid bifurcation with mild less than 50% proximal ICA stenosis. Left carotid system: No evidence of dissection or occlusion. Severe calcified plaque of the left carotid bifurcation with moderate 50-70% proximal ICA stenosis. Vertebral arteries: Calcified plaque of the bilateral vertebral arteries with severe stenosis of the vertebral artery origins bilaterally. Mild calcific atherosclerosis of the left V1 segment. Skeleton: Moderate cervical spondylosis with disc and facet degenerative changes greatest at the C4-C7 levels. No high-grade bony canal stenosis. Other neck: Negative. Upper chest: Negative. Review of the MIP images confirms the above findings CTA  HEAD FINDINGS Anterior circulation: Right MCA distribution mid to distal M2 branch occlusion within the mid sylvian fissure (series 12, image 10). No additional large vessel occlusion, aneurysm, or high-grade stenosis. Calcified plaque of the carotid siphons with mild less than 50% paraclinoid stenosis. Posterior circulation: No significant stenosis, proximal occlusion, aneurysm, or vascular malformation. Venous sinuses: As permitted by contrast timing, patent. Anatomic variants: Complete circle-of-Willis. Delayed phase: No  abnormal intracranial enhancement. Review of the MIP images confirms the above findings IMPRESSION: 1. Right mid to distal M2 branch occlusion within mid sylvian fissure. 2. No additional intracranial large vessel occlusion, aneurysm, or significant stenosis. 3. Bilateral vertebral artery origin severe stenosis with calcified plaque. 4. Right proximal ICA mild less than 50% stenosis with calcified plaque. 5. Left proximal ICA moderate 50-70% stenosis with calcified plaque. 6. Left subclavian artery severe greater than 70% proximal stenosis with calcified plaque. These results will be called to the ordering clinician or representative by the Radiologist Assistant, and communication documented in the PACS or zVision Dashboard. Electronically Signed   By: Kristine Garbe M.D.   On: 10/13/2017 03:41   Dg Chest 2 View  Result Date: 10/12/2017 CLINICAL DATA:  84 y/o F; patient presents with left-sided weakness tonight. Back pain from multiple intra discs. EXAM: CHEST - 2 VIEW COMPARISON:  None. FINDINGS: Mild cardiomegaly given projection and technique. Calcific aortic atherosclerosis. No consolidation, effusion, or pneumothorax. Lower thoracic, probably T12 and L1 mild anterior compression deformities, age indeterminate. IMPRESSION: Lower thoracic, probably T12-L1 mild anterior compression deformities, age indeterminate. No acute pulmonary process identified. Electronically Signed   By: Kristine Garbe M.D.   On: 10/12/2017 23:17   Ct Head Wo Contrast  Result Date: 10/12/2017 CLINICAL DATA:  Hit head.  Fall. EXAM: CT HEAD WITHOUT CONTRAST TECHNIQUE: Contiguous axial images were obtained from the base of the skull through the vertex without intravenous contrast. COMPARISON:  None. FINDINGS: Brain: There is atrophy and chronic small vessel disease changes. No acute intracranial abnormality. Specifically, no hemorrhage, hydrocephalus, mass lesion, acute infarction, or significant intracranial  injury. Vascular: No hyperdense vessel or unexpected calcification. Skull: No acute calvarial abnormality. Sinuses/Orbits: Visualized paranasal sinuses and mastoids clear. Orbital soft tissues unremarkable. Other: None IMPRESSION: No acute intracranial abnormality. Atrophy, chronic microvascular disease. Electronically Signed   By: Rolm Baptise M.D.   On: 10/12/2017 20:53   Ct Angio Neck W Or Wo Contrast  Result Date: 10/13/2017 CLINICAL DATA:  84 y/o F; left-sided facial droop and slurred speech. EXAM: CT ANGIOGRAPHY HEAD AND NECK TECHNIQUE: Multidetector CT imaging of the head and neck was performed using the standard protocol during bolus administration of intravenous contrast. Multiplanar CT image reconstructions and MIPs were obtained to evaluate the vascular anatomy. Carotid stenosis measurements (when applicable) are obtained utilizing NASCET criteria, using the distal internal carotid diameter as the denominator. CONTRAST:  28mL ISOVUE-370 IOPAMIDOL (ISOVUE-370) INJECTION 76% COMPARISON:  10/12/2017 CT head.  10/13/2017 MRI head. FINDINGS: CTA NECK FINDINGS Aortic arch: Bovine variant branching. Imaged portion shows no evidence of aneurysm or dissection. Left subclavian artery extensive calcified plaque with moderate to severe 70% proximal stenosis. Right carotid system: No evidence of dissection, stenosis (50% or greater) or occlusion. Extensive calcified plaque of the carotid bifurcation with mild less than 50% proximal ICA stenosis. Left carotid system: No evidence of dissection or occlusion. Severe calcified plaque of the left carotid bifurcation with moderate 50-70% proximal ICA stenosis. Vertebral arteries: Calcified plaque of the bilateral vertebral arteries with severe stenosis of the vertebral  artery origins bilaterally. Mild calcific atherosclerosis of the left V1 segment. Skeleton: Moderate cervical spondylosis with disc and facet degenerative changes greatest at the C4-C7 levels. No  high-grade bony canal stenosis. Other neck: Negative. Upper chest: Negative. Review of the MIP images confirms the above findings CTA HEAD FINDINGS Anterior circulation: Right MCA distribution mid to distal M2 branch occlusion within the mid sylvian fissure (series 12, image 10). No additional large vessel occlusion, aneurysm, or high-grade stenosis. Calcified plaque of the carotid siphons with mild less than 50% paraclinoid stenosis. Posterior circulation: No significant stenosis, proximal occlusion, aneurysm, or vascular malformation. Venous sinuses: As permitted by contrast timing, patent. Anatomic variants: Complete circle-of-Willis. Delayed phase: No abnormal intracranial enhancement. Review of the MIP images confirms the above findings IMPRESSION: 1. Right mid to distal M2 branch occlusion within mid sylvian fissure. 2. No additional intracranial large vessel occlusion, aneurysm, or significant stenosis. 3. Bilateral vertebral artery origin severe stenosis with calcified plaque. 4. Right proximal ICA mild less than 50% stenosis with calcified plaque. 5. Left proximal ICA moderate 50-70% stenosis with calcified plaque. 6. Left subclavian artery severe greater than 70% proximal stenosis with calcified plaque. These results will be called to the ordering clinician or representative by the Radiologist Assistant, and communication documented in the PACS or zVision Dashboard. Electronically Signed   By: Kristine Garbe M.D.   On: 10/13/2017 03:41   Mr Brain Wo Contrast  Result Date: 10/13/2017 CLINICAL DATA:  84 y/o  F; left-sided weakness. EXAM: MRI HEAD WITHOUT CONTRAST TECHNIQUE: Multiplanar, multiecho pulse sequences of the brain and surrounding structures were obtained without intravenous contrast. COMPARISON:  10/12/2017 CT head. FINDINGS: Brain: Reduced diffusion within the right posterior insula, frontal operculum, and posterolateral frontal lobe compatible with acute/early subacute infarction.  No associated hemorrhage or mass effect. Several nonspecific T2 FLAIR hyperintensities in subcortical and periventricular white matter are compatible with mild chronic microvascular ischemic changes for age. Mild volume loss of the brain. Vascular: Linear focus of susceptibility blooming measuring 7 mm in the right sylvian fissure, likely a vessel thrombus (series 10, image 29). Skull and upper cervical spine: Normal marrow signal. Sinuses/Orbits: Negative.  Bilateral intra-ocular lens replacement. Other: None. IMPRESSION: 1. Acute/early subacute infarction involving the right posterior insula, frontal operculum, and posterolateral frontal lobe. No hemorrhage or mass effect. 2. 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 3. Mild for age chronic microvascular ischemic changes and volume loss of the brain. These results will be called to the ordering clinician or representative by the Radiologist Assistant, and communication documented in the PACS or zVision Dashboard. Electronically Signed   By: Kristine Garbe M.D.   On: 10/13/2017 01:42   2D Echocardiogram  - Left ventricle: The cavity size was normal. Wall thickness was   increased in a pattern of mild LVH. Systolic function was normal.   The estimated ejection fraction was in the range of 60% to 65%.   Wall motion was normal; there were no regional wall motion   abnormalities. Features are consistent with a pseudonormal left   ventricular filling pattern, with concomitant abnormal relaxation   and increased filling pressure (grade 2 diastolic dysfunction).   E/medial e&' > 15, suggesting LV end diastolic pressure at least   20 mmHg. - Aortic valve: There was no stenosis. - Mitral valve: There was trivial regurgitation. - Left atrium: The atrium was mildly dilated. - Right ventricle: The cavity size was normal. Systolic function  was normal. - Right atrium: The atrium  was mildly dilated. - Tricuspid valve: Peak RV-RA gradient (S): 47 mm Hg. - Pulmonary arteries: PA peak pressure: 50 mm Hg (S). - Inferior vena cava: The vessel was normal in size. The   respirophasic diameter changes were in the normal range (>= 50%),   consistent with normal central venous pressure.  Impressions:  - Normal LV size with mild LV hypertrophy. EF 60-65%. Moderate diastolic dysfunction. Normal RV size and systolic function. No significant valvular abnormalities. Moderate pulmonary hypertension.   PHYSICAL EXAM General: Patient awake alert no distress HEENT: Normocephalic atraumatic dry oral mucous membranes Lungs: Clear to auscultation Cardiovascular: S1-S2 heard, regular rate rhythm Extremities: Warm and well-perfused with some bleeding spots on the right leg.  Hyperpigmentation in both lower extremities with some chronic venous stasis changes. Bilateral ecchymotic areas on her extremities (from ASA)  NEUROLOGICAL EXAM Patient is awake, alert, oriented x3. Her speech is moderately dysarthric. Naming, repetition and comprehension are intact. She is able to follow multistep commands. She is able to provide a reliable history. Cranial nerves: Pupils are equal round reactive to light, R gaze preference, unable to force eyes to cross the midline, does not blink to threat on the L but does on the right. She can count fingers on the R but not on the left. She has left lower facial weakness at rest and upon trying to smile. some weakness in the left eyelid closure. Decreased facial sensation is diminished on the left. tongue midline, palate elevates symmetrically. Motor exam: Left upper extremity is 0/5, left lower extremity is 0/5. Right upper and lower extremities are normal strength at 5/5. Sensory exam: Decreased sensation on the left when compared to the right.  With her eyes closed, she did not recognized touch on her left side and also reported appreciating touch on the  right side when I was actually touching both her legs. Does not recognize her L arm. L neglect. Coordination: Intact finger-nose-finger on the right,  Gait testing was deferred at this time.   ASSESSMENT/PLAN Sonya [name] is a 84 y.o. female with history of HTN, hypothyroidism, CKD stage III, CAD presenting with L HP and L facial droop x 1 week  Stroke:  right MCA territory infarct with R MCA thrombus, infarcts felt to be embolic secondary to unknown source in setting of multiple intracranial atherosclerosis, suspicious for atrial fibrillation   CT head No acute stroke. Small vessel disease. Atrophy.   MRI  R posterior insula, frontal operculum and posterolateral frontal lobe infarct. R MCA thrombus. Small vessel disease. Atrophy.  CTA head & neck RM2 branch occlusion. B VA origin severe stenosis. R ICA < 50% stenoisis. L ICA 50-70% stenosis. L subclavian severe > 70% stenosis  2D Echo  EF 60-65%. No source of embolus. Mod pulm HTN. LA enlargement  Consider TEE/Loop to look for embolic source. Concerned pt will not be a long-term AC candidate d/t hx multiple falls. Have discussed w/ DR. Siearra Amberg and he will review. Patient agreeable to proceed if needed  LDL 76  HgbA1c 5.9  Lovenox 40 mg sq daily for VTE prophylaxis  aspirin 81 mg daily prior to admission, now on aspirin 325 mg daily. Pt intolerant to Plavix (nausea). Continue aspirin 325 mg daily  Therapy recommendations:  SNF  Disposition:  pending  Follow-up Stroke Clinic at [facility] in 4 weeks. Office will call with appointment date and time. Order placed.  Hypertensive Urgency  BP 230s on arrival  Variable BP - as high as 234/91, down to 139/58 during rounds  Home meds: lotensin 20, catapress 0.2 hs, toprol 100 XL daily  Now on: lotensin 20, catapress 0.2 bid, toprol 25 bid . Permissive hypertension (OK if < 220/120) but gradually normalize in 5-7 days . Long-term BP goal  normotensive  Hyperlipidemia  Home meds:  Zocor 40, resumed in hospital then d/c'd - I have resumed  LDL 76, goal < 70  Continue statin at discharge  Other Stroke Risk Factors  Advanced age  Coronary artery disease  Other Active Problems  Elevated troponin 0.13, 0.15, 0.09   Hypothyroidism on synthroid. TSH pending   Anxiety on effexor  Elevated creatinine Cr 2.4  Hospital day # 0  Burnetta Sabin, MSN, APRN, ANVP-BC, AGPCNP-BC Advanced Practice Stroke Nurse [place] for Schedule & Pager information 10/13/2017 9:47 AM   ATTENDING NOTE: I reviewed above note and agree with the assessment and plan. I have made any additions or clarifications directly to the above note. Pt was seen and examined.   84 year old female with history of hypertension, CKD stage III, CAD presented with left-sided weakness, left facial droop and slurred speech for 1 week and recent fall.  CT no acute finding.  MRI showed left MCA infarct with left MCA branch thrombus.  CT head and neck confirmed right M2 distal occlusion.  However, patient also had significant intracranial stenosis including right ICA less than 50% stenosis, left ICA 50 to 70% stenosis, bilateral VA origin severe stenosis, left subclavian artery more than 70% stenosis, bilateral siphon and left V2/V3 atherosclerosis.  EF 60 to 65%.  A1c 5.9 and LDL 76.  UDS negative.  Patient was very drowsy sleepy during my rounding.  Not cooperate on exam.  Able to arousable briefly but back to sleep if no repetitive stimulation.  Patient stroke likely due to large vessel disease given significant sent atherosclerosis showing on CTA head and neck as well as stroke risk factors.  However cardioembolic source cannot be ruled out at this time.  There is a concern about her fall risk and bleeding risk from a antiplatelet, therefore not a good candidate for anticoagulation.  Continue aspirin and Lipitor for now and will discuss with her  tomorrow. Her BP is significantly elevated on admission, resumed her home BP meds.  BP improved.  Will follow.  Rosalin Hawking, MD PhD Stroke Neurology 10/13/2017 4:47 PM     To contact Stroke Continuity provider, please refer to [email or web address] After hours, contact General Neurology

## 2017-10-13 NOTE — Progress Notes (Addendum)
Patient arrived to the unit via bed from the emergency department.  Patient is alert and oriented x 4.  With complaints of a headache .  Skin assessment complete.  Bruises to the arms and legs bilaterally.  Skin tear to the left elbow. Discoloration to the lower extremities.  Placed the patient on cardiac monitoring.  Educated the patient on how to reach the staff on the unit.  Lowered the bed, activated the bed alarm and placed the call light within reach. Will continue to monitor the patient.

## 2017-10-13 NOTE — Progress Notes (Signed)
Notified the MD that the results for the MRI is available. Will continue to monitor the patient and notify as needed

## 2017-10-13 NOTE — Progress Notes (Signed)
OT Cancellation    10/13/17 0800  OT Visit Information  Last OT Received On 10/13/17  Reason Eval/Treat Not Completed Patient at procedure or test/ unavailable (echo)  Maurie Boettcher, OT/L  OT Clinical Specialist [phone]

## 2017-10-13 NOTE — Progress Notes (Signed)
Occupational Therapy Evaluation Patient Details Name: Sonya [name] MRN: [id number] DOB: [birth year] Today's Date: 10/13/2017    History of Present Illness 84 year old female with history of essential hypertension, hypothyroidism, CKD stage III, CAD came to the hospital for evaluation of left-sided weakness as well as left-sided facial droop.  Per family this is somewhat progressed over the course of last week therefore she was not a candidate for TPA.  Initial CT of the head was negative but MRI of the brain was positive which showed early subacute right posterior insula, fracture operculum and posterior lateral lobe infarct.    Clinical Impression   PTA, pt lived at home with husband and was modified independent with mobility and ADL. Family endorses recent falls. Pt currently requires max A with mobility and ADL due to significant deficits listed below. Family prefers rehab in [place]. Recommend rehab at SNF. Will follow acutely  To facilitate safe DC to next venue of care.     Follow Up Recommendations  SNF;Supervision/Assistance - 24 hour    Equipment Recommendations  3 in 1 bedside commode    Recommendations for Other Services Speech consult     Precautions / Restrictions Precautions Precautions: Fall Precaution Comments: skin tear L elbow form fall      Mobility Bed Mobility Overal bed mobility: Needs Assistance Bed Mobility: Sidelying to Sit   Sidelying to sit: Max assist          Transfers Overall transfer level: Needs assistance   Transfers: Sit to/from Stand;Stand Pivot Transfers Sit to Stand: Mod assist Stand pivot transfers: Max assist;+2 safety/equipment            Balance Overall balance assessment: Needs assistance Sitting-balance support: Feet supported Sitting balance-Leahy Scale: Zero       Standing balance-Leahy Scale: Zero                             ADL either performed or assessed with clinical judgement   ADL Overall  ADL's : Needs assistance/impaired Eating/Feeding: Moderate assistance;Sitting Eating/Feeding Details (indicate cue type and reason): neglecitng L side Grooming: Moderate assistance;Sitting   Upper Body Bathing: Moderate assistance;Sitting   Lower Body Bathing: Maximal assistance;Sit to/from stand   Upper Body Dressing : Maximal assistance;Sitting   Lower Body Dressing: Maximal assistance;Sit to/from stand   Toilet Transfer: Maximal assistance;Stand-pivot   Toileting- Clothing Manipulation and Hygiene: Maximal assistance;Sit to/from stand       Functional mobility during ADLs: Maximal assistance;+2 for safety/equipment;Cueing for safety;Cueing for sequencing       Vision Patient Visual Report: No change from baseline Vision Assessment?: Yes Eye Alignment: Within Functional Limits Ocular Range of Motion: Within Functional Limits Alignment/Gaze Preference: Gaze right Tracking/Visual Pursuits: Decreased smoothness of horizontal tracking;Decreased smoothness of vertical tracking Saccades: Additional head turns occurred during testing;Additional eye shifts occurred during testing Convergence: Within functional limits Visual Fields: Left visual field deficit     Perception Perception Perception Tested?: Yes Perception Deficits: Inattention/neglect Inattention/Neglect: Does not attend to left visual field;Does not attend to left side of body Spatial deficits: poor spatial awareness   Praxis Praxis Praxis tested?: Deficits Deficits: Initiation;Organization    Pertinent Vitals/Pain Pain Assessment: Faces Faces Pain Scale: Hurts little more Pain Location: back Pain Descriptors / Indicators: Aching Pain Intervention(s): Repositioned     Hand Dominance Right   Extremity/Trunk Assessment Upper Extremity Assessment Upper Extremity Assessment: LUE deficits/detail LUE Deficits / Details: developing flexor synergy  -brunstrom stage 2; not using funcitonally;  poor attention to L  side LUE Sensation: decreased light touch;decreased proprioception LUE Coordination: decreased fine motor;decreased gross motor   Lower Extremity Assessment Lower Extremity Assessment: Defer to PT evaluation   Cervical / Trunk Assessment Cervical / Trunk Assessment: Other exceptions;Kyphotic Cervical / Trunk Exceptions: L lateral lean; unable to sit unsupported at midline; forward head   Communication     Cognition Arousal/Alertness: Awake/alert Behavior During Therapy: Flat affect Overall Cognitive Status: Impaired/Different from baseline Area of Impairment: Attention;Memory;Following commands;Safety/judgement;Awareness;Problem solving                   Current Attention Level: Sustained Memory: Decreased recall of precautions;Decreased short-term memory Following Commands: Follows one step commands with increased time Safety/Judgement: Decreased awareness of safety;Decreased awareness of deficits Awareness: Intellectual Problem Solving: Slow processing;Decreased initiation;Difficulty sequencing;Requires verbal cues;Requires tactile cues     General Comments  Educated fmaily on L neglect     Exercises     Shoulder Instructions      Home Living Family/patient expects to be discharged to:: Skilled nursing facility   Available Help at Discharge: Family Type of Home: House                              Lives With: Spouse    Prior Functioning/Environment Level of Independence: Independent with assistive device(s)        Comments: multiple falls        OT Problem List: Decreased strength;Decreased range of motion;Decreased activity tolerance;Impaired balance (sitting and/or standing);Impaired vision/perception;Decreased coordination;Decreased cognition;Decreased safety awareness;Decreased knowledge of use of DME or AE;Decreased knowledge of precautions;Impaired sensation;Impaired tone;Impaired UE functional use;Pain      OT Treatment/Interventions:  Self-care/ADL training;Therapeutic exercise;Neuromuscular education;DME and/or AE instruction;Therapeutic activities;Cognitive remediation/compensation;Visual/perceptual remediation/compensation;Patient/family education;Balance training    OT Goals(Current goals can be found in the care plan section) Acute Rehab OT Goals Patient Stated Goal: to go home OT Goal Formulation: With patient/family Time For Goal Achievement: 10/27/17 Potential to Achieve Goals: Good  OT Frequency: Min 2X/week   Barriers to D/C:            Co-evaluation              AM-PAC PT "6 Clicks" Daily Activity     Outcome Measure Help from another person eating meals?: A Little Help from another person taking care of personal grooming?: A Little Help from another person toileting, which includes using toliet, bedpan, or urinal?: A Lot Help from another person bathing (including washing, rinsing, drying)?: A Lot Help from another person to put on and taking off regular upper body clothing?: A Lot Help from another person to put on and taking off regular lower body clothing?: A Lot 6 Click Score: 14   End of Session Equipment Utilized During Treatment: Gait belt Nurse Communication: Mobility status;Other (comment)(BP)  Activity Tolerance: Patient tolerated treatment well;Other (comment)(noted high BP; nsg aware and gave meds) Patient left: in chair;with call bell/phone within reach;with chair alarm set;with family/visitor present  OT Visit Diagnosis: Other abnormalities of gait and mobility (R26.89);Muscle weakness (generalized) (M62.81);Repeated falls (R29.6);Other symptoms and signs involving cognitive function;Hemiplegia and hemiparesis;Pain Hemiplegia - Right/Left: Left Hemiplegia - dominant/non-dominant: Non-Dominant Hemiplegia - caused by: Cerebral infarction Pain - part of body: (back)                Time: 1025-1055 OT Time Calculation (min): 30 min Charges:  OT General Charges $OT Visit: 1  Visit OT Evaluation $OT Eval Moderate  Complexity: 1 Mod OT Treatments $Self Care/Home Management : 8-22 mins  Maurie Boettcher, OT/L  OT Clinical Specialist [phone]   [facility] 10/13/2017, 11:19 AM

## 2017-10-14 ENCOUNTER — Inpatient Hospital Stay (HOSPITAL_COMMUNITY): Payer: Medicare PPO

## 2017-10-14 MED ORDER — BUTALBITAL-APAP-CAFFEINE 50-325-40 MG PO TABS
1.0000 | ORAL_TABLET | Freq: Two times a day (BID) | ORAL | Status: DC | PRN
  Filled 2017-10-14: qty 1

## 2017-10-14 MED ORDER — OXYCODONE HCL 5 MG PO TABS
5.0000 mg | ORAL_TABLET | Freq: Once | ORAL | Status: AC
  Administered 2017-10-14: 5 mg via ORAL
  Filled 2017-10-14: qty 1

## 2017-10-14 NOTE — Progress Notes (Addendum)
STROKE TEAM PROGRESS NOTE   INTERVAL HISTORY Son and husband at bedside. Pt lying in the bed. They feel she is worse today than she was yesterday morning. Reported some difficulty with swallowing this am. Seems unchanged from yesterday afternoon when I felt she was tired from the day. Will check CT head. Dr. Erlinda Hong to further discuss embolic etiology, workup and possibility of long-term AC. Family agreeable.   Vitals:   10/13/17 1521 10/13/17 2238 10/14/17 0553 10/14/17 1006  BP: (!) 150/54 (!) 203/78 (!) 172/60 (!) 164/72  Pulse: 66 86 68   Resp:  17 17   Temp: 98.1 F (36.7 C) 99 F (37.2 C) 98.8 F (37.1 C)   TempSrc: Oral Oral    SpO2: 94% 96% 94%   Weight:      Height:        CBC:  Recent Labs  Lab 10/12/17 2034  WBC 12.7*  NEUTROABS 9.8*  HGB 13.9  HCT 45.5  MCV 91.7  PLT 009    Basic Metabolic Panel:  Recent Labs  Lab 10/12/17 2034  NA 141  K 4.3  CL 106  CO2 26  GLUCOSE 112*  BUN 19  CREATININE 1.00  CALCIUM 9.2   Lipid Panel:     Component Value Date/Time   CHOL 145 10/13/2017 0217   TRIG 156 (H) 10/13/2017 0217   HDL 38 (L) 10/13/2017 0217   CHOLHDL 3.8 10/13/2017 0217   VLDL 31 10/13/2017 0217   LDLCALC 76 10/13/2017 0217   HgbA1c:  Lab Results  Component Value Date   HGBA1C 5.9 (H) 10/13/2017   Urine Drug Screen:     Component Value Date/Time   LABOPIA NONE DETECTED 10/12/2017 2203   COCAINSCRNUR NONE DETECTED 10/12/2017 2203   LABBENZ NONE DETECTED 10/12/2017 2203   AMPHETMU NONE DETECTED 10/12/2017 2203   THCU NONE DETECTED 10/12/2017 2203   LABBARB NONE DETECTED 10/12/2017 2203    Alcohol Level     Component Value Date/Time   ETH <10 10/12/2017 2034    IMAGING Ct Angio Head W Or Wo Contrast  Result Date: 10/13/2017 CLINICAL DATA:  84 y/o F; left-sided facial droop and slurred speech. EXAM: CT ANGIOGRAPHY HEAD AND NECK TECHNIQUE: Multidetector CT imaging of the head and neck was performed using the standard protocol during  bolus administration of intravenous contrast. Multiplanar CT image reconstructions and MIPs were obtained to evaluate the vascular anatomy. Carotid stenosis measurements (when applicable) are obtained utilizing NASCET criteria, using the distal internal carotid diameter as the denominator. CONTRAST:  26mL ISOVUE-370 IOPAMIDOL (ISOVUE-370) INJECTION 76% COMPARISON:  10/12/2017 CT head.  10/13/2017 MRI head. FINDINGS: CTA NECK FINDINGS Aortic arch: Bovine variant branching. Imaged portion shows no evidence of aneurysm or dissection. Left subclavian artery extensive calcified plaque with moderate to severe 70% proximal stenosis. Right carotid system: No evidence of dissection, stenosis (50% or greater) or occlusion. Extensive calcified plaque of the carotid bifurcation with mild less than 50% proximal ICA stenosis. Left carotid system: No evidence of dissection or occlusion. Severe calcified plaque of the left carotid bifurcation with moderate 50-70% proximal ICA stenosis. Vertebral arteries: Calcified plaque of the bilateral vertebral arteries with severe stenosis of the vertebral artery origins bilaterally. Mild calcific atherosclerosis of the left V1 segment. Skeleton: Moderate cervical spondylosis with disc and facet degenerative changes greatest at the C4-C7 levels. No high-grade bony canal stenosis. Other neck: Negative. Upper chest: Negative. Review of the MIP images confirms the above findings CTA HEAD FINDINGS Anterior circulation: Right MCA  distribution mid to distal M2 branch occlusion within the mid sylvian fissure (series 12, image 10). No additional large vessel occlusion, aneurysm, or high-grade stenosis. Calcified plaque of the carotid siphons with mild less than 50% paraclinoid stenosis. Posterior circulation: No significant stenosis, proximal occlusion, aneurysm, or vascular malformation. Venous sinuses: As permitted by contrast timing, patent. Anatomic variants: Complete circle-of-Willis. Delayed  phase: No abnormal intracranial enhancement. Review of the MIP images confirms the above findings IMPRESSION: 1. Right mid to distal M2 branch occlusion within mid sylvian fissure. 2. No additional intracranial large vessel occlusion, aneurysm, or significant stenosis. 3. Bilateral vertebral artery origin severe stenosis with calcified plaque. 4. Right proximal ICA mild less than 50% stenosis with calcified plaque. 5. Left proximal ICA moderate 50-70% stenosis with calcified plaque. 6. Left subclavian artery severe greater than 70% proximal stenosis with calcified plaque. These results will be called to the ordering clinician or representative by the Radiologist Assistant, and communication documented in the PACS or zVision Dashboard. Electronically Signed   By: Kristine Garbe M.D.   On: 10/13/2017 03:41   Dg Chest 2 View  Result Date: 10/12/2017 CLINICAL DATA:  84 y/o F; patient presents with left-sided weakness tonight. Back pain from multiple intra discs. EXAM: CHEST - 2 VIEW COMPARISON:  None. FINDINGS: Mild cardiomegaly given projection and technique. Calcific aortic atherosclerosis. No consolidation, effusion, or pneumothorax. Lower thoracic, probably T12 and L1 mild anterior compression deformities, age indeterminate. IMPRESSION: Lower thoracic, probably T12-L1 mild anterior compression deformities, age indeterminate. No acute pulmonary process identified. Electronically Signed   By: Kristine Garbe M.D.   On: 10/12/2017 23:17   Ct Head Wo Contrast  Result Date: 10/12/2017 CLINICAL DATA:  Hit head.  Fall. EXAM: CT HEAD WITHOUT CONTRAST TECHNIQUE: Contiguous axial images were obtained from the base of the skull through the vertex without intravenous contrast. COMPARISON:  None. FINDINGS: Brain: There is atrophy and chronic small vessel disease changes. No acute intracranial abnormality. Specifically, no hemorrhage, hydrocephalus, mass lesion, acute infarction, or significant  intracranial injury. Vascular: No hyperdense vessel or unexpected calcification. Skull: No acute calvarial abnormality. Sinuses/Orbits: Visualized paranasal sinuses and mastoids clear. Orbital soft tissues unremarkable. Other: None IMPRESSION: No acute intracranial abnormality. Atrophy, chronic microvascular disease. Electronically Signed   By: Rolm Baptise M.D.   On: 10/12/2017 20:53   Ct Angio Neck W Or Wo Contrast  Result Date: 10/13/2017 CLINICAL DATA:  84 y/o F; left-sided facial droop and slurred speech. EXAM: CT ANGIOGRAPHY HEAD AND NECK TECHNIQUE: Multidetector CT imaging of the head and neck was performed using the standard protocol during bolus administration of intravenous contrast. Multiplanar CT image reconstructions and MIPs were obtained to evaluate the vascular anatomy. Carotid stenosis measurements (when applicable) are obtained utilizing NASCET criteria, using the distal internal carotid diameter as the denominator. CONTRAST:  32mL ISOVUE-370 IOPAMIDOL (ISOVUE-370) INJECTION 76% COMPARISON:  10/12/2017 CT head.  10/13/2017 MRI head. FINDINGS: CTA NECK FINDINGS Aortic arch: Bovine variant branching. Imaged portion shows no evidence of aneurysm or dissection. Left subclavian artery extensive calcified plaque with moderate to severe 70% proximal stenosis. Right carotid system: No evidence of dissection, stenosis (50% or greater) or occlusion. Extensive calcified plaque of the carotid bifurcation with mild less than 50% proximal ICA stenosis. Left carotid system: No evidence of dissection or occlusion. Severe calcified plaque of the left carotid bifurcation with moderate 50-70% proximal ICA stenosis. Vertebral arteries: Calcified plaque of the bilateral vertebral arteries with severe stenosis of the vertebral artery origins bilaterally. Mild calcific atherosclerosis  of the left V1 segment. Skeleton: Moderate cervical spondylosis with disc and facet degenerative changes greatest at the C4-C7 levels.  No high-grade bony canal stenosis. Other neck: Negative. Upper chest: Negative. Review of the MIP images confirms the above findings CTA HEAD FINDINGS Anterior circulation: Right MCA distribution mid to distal M2 branch occlusion within the mid sylvian fissure (series 12, image 10). No additional large vessel occlusion, aneurysm, or high-grade stenosis. Calcified plaque of the carotid siphons with mild less than 50% paraclinoid stenosis. Posterior circulation: No significant stenosis, proximal occlusion, aneurysm, or vascular malformation. Venous sinuses: As permitted by contrast timing, patent. Anatomic variants: Complete circle-of-Willis. Delayed phase: No abnormal intracranial enhancement. Review of the MIP images confirms the above findings IMPRESSION: 1. Right mid to distal M2 branch occlusion within mid sylvian fissure. 2. No additional intracranial large vessel occlusion, aneurysm, or significant stenosis. 3. Bilateral vertebral artery origin severe stenosis with calcified plaque. 4. Right proximal ICA mild less than 50% stenosis with calcified plaque. 5. Left proximal ICA moderate 50-70% stenosis with calcified plaque. 6. Left subclavian artery severe greater than 70% proximal stenosis with calcified plaque. These results will be called to the ordering clinician or representative by the Radiologist Assistant, and communication documented in the PACS or zVision Dashboard. Electronically Signed   By: Kristine Garbe M.D.   On: 10/13/2017 03:41   Mr Brain Wo Contrast  Result Date: 10/13/2017 CLINICAL DATA:  84 y/o  F; left-sided weakness. EXAM: MRI HEAD WITHOUT CONTRAST TECHNIQUE: Multiplanar, multiecho pulse sequences of the brain and surrounding structures were obtained without intravenous contrast. COMPARISON:  10/12/2017 CT head. FINDINGS: Brain: Reduced diffusion within the right posterior insula, frontal operculum, and posterolateral frontal lobe compatible with acute/early subacute  infarction. No associated hemorrhage or mass effect. Several nonspecific T2 FLAIR hyperintensities in subcortical and periventricular white matter are compatible with mild chronic microvascular ischemic changes for age. Mild volume loss of the brain. Vascular: Linear focus of susceptibility blooming measuring 7 mm in the right sylvian fissure, likely a vessel thrombus (series 10, image 29). Skull and upper cervical spine: Normal marrow signal. Sinuses/Orbits: Negative.  Bilateral intra-ocular lens replacement. Other: None. IMPRESSION: 1. Acute/early subacute infarction involving the right posterior insula, frontal operculum, and posterolateral frontal lobe. No hemorrhage or mass effect. 2. 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 3. Mild for age chronic microvascular ischemic changes and volume loss of the brain. These results will be called to the ordering clinician or representative by the Radiologist Assistant, and communication documented in the PACS or zVision Dashboard. Electronically Signed   By: Kristine Garbe M.D.   On: 10/13/2017 01:42   2D Echocardiogram  - Left ventricle: The cavity size was normal. Wall thickness was increased in a pattern of mild LVH. Systolic function was normal. The estimated ejection fraction was in the range of 60% to 65%. Wall motion was normal; there were no regional wall motion abnormalities. Features are consistent with a pseudonormal left ventricular filling pattern, with concomitant abnormal relaxation and increased filling pressure (grade 2 diastolic dysfunction). E/medial e&' > 15, suggesting LV end diastolic pressure at least 20 mmHg. - Aortic valve: There was no stenosis. - Mitral valve: There was trivial regurgitation. - Left atrium: The atrium was mildly dilated. - Right ventricle: The cavity size was normal. Systolic function was normal. - Right atrium: The atrium was mildly  dilated. - Tricuspid valve: Peak RV-RA gradient (S): 47 mm Hg. - Pulmonary  arteries: PA peak pressure: 50 mm Hg (S). - Inferior vena cava: The vessel was normal in size. The respirophasic diameter changes were in the normal range (>= 50%), consistent with normal central venous pressure. Impressions: - Normal LV size with mild LV hypertrophy. EF 60-65%. Moderate diastolic dysfunction. Normal RV size and systolic function. No significant valvular abnormalities. Moderate pulmonary hypertension.   PHYSICAL EXAM General: Patient awake alert no distress HEENT: Normocephalic atraumatic dry oral mucous membranes Lungs: Clear to auscultation Cardiovascular: S1-S2 heard, regular rate rhythm Extremities: Warm and well-perfused with some bleeding spots on the right leg.  Hyperpigmentation in both lower extremities with some chronic venous stasis changes. Bilateral ecchymotic areas on her extremities (from ASA)  NEUROLOGICAL EXAM Patient is awake, alert, oriented x3. Her speech is moderately dysarthric. Naming, repetition and comprehension are intact. She is able to follow multistep commands. She is able to provide a reliable history. Cranial nerves: Pupils are equal round reactive to light, R gaze preference, unable to force eyes to cross the midline, does not blink to threat on the L but does on the right. She can count fingers on the R but not on the left. She has left lower facial weakness at rest and upon trying to smile. some weakness in the left eyelid closure. Decreased facial sensation is diminished on the left. tongue midline, palate elevates symmetrically. Motor exam: Left upper extremity is 0/5, left lower extremity is 0/5. Right upper and lower extremities are normal strength at 5/5. Sensory exam: Decreased sensation on the left when compared to the right.  With her eyes closed, she did not recognized touch on her left side and also reported appreciating touch on the right side when I was  actually touching both her legs. Does not recognize her L arm. L neglect. Coordination: Intact finger-nose-finger on the right,  Gait testing was deferred at this time.   ASSESSMENT/PLAN Sonya [name] is a 84 y.o. female with history of HTN, hypothyroidism, CKD stage III, CAD presenting with L HP and L facial droop x 1 week  Stroke:  right MCA territory infarct with R MCA thrombus, infarcts felt to be embolic secondary to unknown source in setting of multiple intracranial atherosclerosis, suspicious for atrial fibrillation   CT head No acute stroke. Small vessel disease. Atrophy.   MRI  R posterior insula, frontal operculum and posterolateral frontal lobe infarct. R MCA thrombus. Small vessel disease. Atrophy.  CTA head & neck RM2 branch occlusion. B VA origin severe stenosis. R ICA < 50% stenoisis. L ICA 50-70% stenosis. L subclavian severe > 70% stenosis  Repeat CT head 8/28 with neuro worsening pending   2D Echo  EF 60-65%. No source of embolus. Mod pulm HTN. LA enlargement  Consider TEE/Loop to look for embolic source. Concerned pt will not be a long-term AC candidate d/t hx multiple falls (may be less likely to fall with SNF placement). Have discussed w/ pt, husband, son. DR. Camerin Jimenez. will discuss with pt.   LDL 76  HgbA1c 5.9  Lovenox 40 mg sq daily for VTE prophylaxis  aspirin 81 mg daily prior to admission, now on aspirin 325 mg daily. Pt intolerant to Plavix (nausea). Continue aspirin 325 mg daily  Therapy recommendations:  SNF  Disposition:  pending  Follow-up Stroke Clinic at [facility] in 4 weeks. Office will call with appointment date and time. Orders in place.  Possible dysphagia  Pt reports trouble swallowing pills this am, worse than usual  Passed Stroke swallow screen  on admission  With neuro worsening, will have SLP assess  Hypertensive Urgency  BP 230s on arrival  Variable BP - as high as 234/91, down to 139/58 during  rounds  Home meds: lotensin 20, catapress 0.2 hs, toprol 100 XL daily  Now on: lotensin 20, catapress 0.2 bid, toprol 25 bid . Permissive hypertension (OK if < 220/120) but gradually normalize in 5-7 days . Long-term BP goal normotensive  Hyperlipidemia  Home meds:  Zocor 40, resumed in hospital then d/c'd - I have resumed  LDL 76, goal < 70  Continue statin at discharge  Other Stroke Risk Factors  Advanced age  Coronary artery disease  Other Active Problems  Elevated troponin 0.13, 0.15, 0.09   Hypothyroidism on synthroid. TSH pending   Anxiety on effexor  Elevated creatinine Cr 2.4  Leukocytosis WBC 12.7  Hospital day # 1  Burnetta Sabin, MSN, APRN, ANVP-BC, AGPCNP-BC [facility] Stroke Nurse [place] for Schedule & Pager information 10/14/2017 10:29 AM   ATTENDING NOTE: I reviewed above note and agree with the assessment and plan. I have made any additions or clarifications directly to the above note. Pt was seen and examined.   84 year old female with history of hypertension, CKD stage III, CAD presented with left-sided weakness, left facial droop and slurred speech for 1 week with worsening for one day and fell at home.  CT no acute finding.  MRI showed left MCA infarct with left MCA branch thrombus.  CT head and neck confirmed right M2 distal occlusion.  However, patient also had significant intracranial stenosis including right ICA less than 50% stenosis, left ICA 50 to 70% stenosis, bilateral VA origin severe stenosis, left subclavian artery more than 70% stenosis, bilateral siphon and left V2/V3 atherosclerosis.  EF 60 to 65%.  A1c 5.9 and LDL 76.  UDS negative. LE venous doppler pending.  Patient continues to be drowsy and sleepy during earlier round. Had CT head showed right MCA large infarct, extended from MRI yesterday. More consistent with completed stroke from penumbra. Her BP was 140-180s overnight. On exam today, she is  sleepy with eyes closed but easily arousable and open eyes and following all simple commands. Orientated to place and people, but not to year or month. Right eye gaze preference, not able to cross midline. Left facial droop. Left hemiplegia. Given her severe neuro deficit, recommend 30 day cardiac event monitoring as outpt to rule out afib. If that is unrevealing and pt neuro condition improves, may consider TEE and loop at that time. Discussed with pt and son, they are in agreement.   Given her allergy to plavix, will continue ASA 325mg  for stroke prevention. Continue zocor. Need aggressive PT/OT. Stroke risk factor modification. BP goal 130-150, avoid hypotension.    Rosalin Hawking, MD PhD Stroke Neurology 10/14/2017 3:15 PM    To contact Stroke Continuity provider, please refer to [email or web address] After hours, contact General Neurology

## 2017-10-14 NOTE — Clinical Social Work Note (Signed)
Clinical Social Work Assessment  Patient Details  Name: Sonya [name] MRN: [id number] Date of Birth: 20-Nov-1932  Date of referral:  10/14/17               Reason for consult:  Facility Placement                Permission sought to share information with:  Facility Sport and exercise psychologist, Family Supports Permission granted to share information::  Yes, Verbal Permission Granted  Name::     Engineer, civil (consulting)::  SNFs  Relationship::  Son  Contact Information:  [phone]  Housing/Transportation Living arrangements for the past 2 months:  Single Family Home Source of Information:  Adult Children Patient Interpreter Needed:  None Criminal Activity/Legal Involvement Pertinent to Current Situation/Hospitalization:  No - Comment as needed Significant Relationships:  Adult Children, Spouse Lives with:  Spouse Do you feel safe going back to the place where you live?  No Need for family participation in patient care:  Yes (Comment)  Care giving concerns:  CSW received consult for possible SNF placement at time of discharge. Patient would not arouse to voice, so CSW spoke with patient's son, [name], regarding PT recommendation of SNF placement at time of discharge. Patient's son reported that patient's spouse is currently unable to care for patient at their home given patient's current physical needs and fall risk. Patient's son expressed understanding of PT recommendation and is agreeable to SNF placement at time of discharge, however he believes patient will be going to CIR first. CSW to continue to follow and assist with discharge planning needs.   Social Worker assessment / plan:  CSW spoke with patient concerning possibility of rehab at [facility] before returning home.  Employment status:  Retired Nurse, adult PT Recommendations:  [place] / Referral to community resources:  [place]  Patient/Family's Response to care:   Patient's son recognizes need for rehab before returning home and is agreeable to a SNF in [place], but only if patient is not going to CIR. CSW explained that patient would have to be assessed and approved by insurance to go to CIR. Patient's son reported understanding.  Patient/Family's Understanding of and Emotional Response to Diagnosis, Current Treatment, and Prognosis:  Patient/family is realistic regarding therapy needs and expressed being hopeful for CIR placement. Patient's son expressed understanding of CSW role and discharge process as well as medical condition. No questions/concerns about plan or treatment.    Emotional Assessment Appearance:  Appears stated age Attitude/Demeanor/Rapport:  Unable to Assess Affect (typically observed):  Unable to Assess Orientation:  Oriented to Self, Oriented to Place, Oriented to Situation Alcohol / Substance use:  Not Applicable Psych involvement (Current and /or in the community):  No (Comment)  Discharge Needs  Concerns to be addressed:  Care Coordination Readmission within the last 30 days:  No Current discharge risk:  Dependent with Mobility Barriers to Discharge:  Continued Medical Work up   [employer], [place] 10/14/2017, 5:03 PM

## 2017-10-14 NOTE — Progress Notes (Signed)
Patient Demographics:    Sonya [name], is a 84 y.o. female, DOB - 1932-06-18, UMP:[id number]  Admit date - 10/12/2017   Admitting Physician Vianne Bulls, MD  Outpatient Primary MD for the patient is Baxter Hire, MD  LOS - 1   Chief Complaint  Patient presents with  . Stroke Symptoms        Subjective:    Sonya [name] today has no fevers, no emesis,  No chest pain,  Son and husband at bedside, left-sided weakness persist  Assessment  & Plan :    Principal Problem:   Unknown when suspected stroke patient was last well Active Problems:   Hypertension   Hypertensive urgency   Hypothyroidism   Left-sided weakness   Elevated troponin   Cerebral embolism with cerebral infarction   CVA (cerebral vascular accident) ([place])  Repeat CT head 10/14/17 1. Evolving moderate sized acute to early subacute ischemic right MCA territory infarct, slightly increased in size relative to previous MRI. No significant edema or evidence for hemorrhagic transformation.  Brief  Summary:  84 year old female with history of essential hypertension, hypothyroidism, CKD stage III, CAD came to the hospital for evaluation of left-sided weakness as well as left-sided facial droop.  Per family this is somewhat progressed over the course of last week therefore she was not a candidate for TPA.  Initial CT of the head was negative but MRI of the brain was positive which showed early subacute right posterior insula, fracture operculum and posterior lateral lobe infarct.     1)Acute Rt MCA infarct with Rt MCA Thrombus- ( Acute right posterior insula, frontal operculum and posterior lateral lobe infarct)-- infarcts felt to be embolic secondary to unknown source in setting of multiple intracranial atherosclerosis, suspicious for atrial fibrillation with Lleft-sided hemiplegia and left-sided facial droop-    CTA head and neck  showed severe stenosis of bilateral vertebral artery, right proximal ICA less than 50%, left proximal ICA 50-70%, left subclavian greater than 70%.  MRI of the brain was positive for CVA as above.  Neurology team consult appreciated - Hemoglobin A1c 5.9, LDL 76  -Echocardiogram with preserved EF (60 to 65 %), ??? Need for TEE/Loop to look for embolic source however patient may not be a candidate for long-term full anticoagulation due to history of multiple falls (5 falls already this year), PTA patient was on aspirin 81 mg daily,, apparently patient has GI intolerance to Plavix, neurologist recommends aspirin 325 mg daily, continue simvastatin 40 mg daily, repeat CT head from 10/14/2017 without new acute findings, please see report above  2)Essential hypertension, uncontrolled- -still allowing some permissive hypertension, okay to continue benazepril 20 mg daily,, clonidine 0.2 mg twice daily, and metoprolol 25 mg twice daily, may use IV labetalol as needed systolic blood pressure over 400 or diastolic over 867 mmhg,   3)Elevated troponin- - Likely demand ischemia.  No evidence of ischemia on the EKG, she remains chest pain-free.  Troponins are trended down now.  Echocardiogram without significant regional wall motion normalities  4)Hypothyroidism- -Continue levothyroxine 25 mcg daily  5)Dysphagia/FEN--bedside swallow eval completed on 10/14/2017, speech pathologist recommends pured diet, thin liquids and crushed medications   DVT prophylaxis: Lovenox Code Status: Full  Family Communication:  Son and Husband  at Bedside  Disposition Plan:  CIR Vs SNF Consultants:   Neurology  Code Status : Full code   DVT Prophylaxis  :  Lovenox   Lab Results  Component Value Date   PLT 221 10/12/2017    Inpatient Medications  Scheduled Meds: . aspirin  300 mg Rectal Daily   Or  . aspirin  325 mg Oral Daily  . B-complex with vitamin C  1 tablet Oral Daily  . benazepril  20 mg Oral Daily  .  cholecalciferol  3,000 Units Oral QHS  . cloNIDine  0.2 mg Oral BID  . enoxaparin (LOVENOX) injection  40 mg Subcutaneous Q24H  . levothyroxine  25 mcg Oral QAC breakfast  . metoprolol tartrate  25 mg Oral BID  . neomycin-bacitracin-polymyxin  1 application Topical Daily  . oxyCODONE  5 mg Oral Once  . pantoprazole  20 mg Oral Daily  . simvastatin  40 mg Oral q1800  . venlafaxine XR  150 mg Oral QHS   Continuous Infusions: PRN Meds:.acetaminophen **OR** acetaminophen (TYLENOL) oral liquid 160 mg/5 mL **OR** acetaminophen, labetalol, ondansetron, senna-docusate   Anti-infectives (From admission, onward)   None       Objective:   Vitals:   10/13/17 1521 10/13/17 2238 10/14/17 0553 10/14/17 1006  BP: (!) 150/54 (!) 203/78 (!) 172/60 (!) 164/72  Pulse: 66 86 68   Resp:  17 17   Temp: 98.1 F (36.7 C) 99 F (37.2 C) 98.8 F (37.1 C)   TempSrc: Oral Oral    SpO2: 94% 96% 94%   Weight:      Height:        Wt Readings from Last 3 Encounters:  10/12/17 56.1 kg     Intake/Output Summary (Last 24 hours) at 10/14/2017 1448 Last data filed at 10/14/2017 1007 Gross per 24 hour  Intake 200 ml  Output -  Net 200 ml    Physical Exam  Gen:- Awake Alert,  In no apparent distress  HEENT:- [place].AT, No sclera icterus Neck-Supple Neck,No JVD,.  Lungs-  CTAB , fair air movement CV- S1, S2 normal Abd-  +ve B.Sounds, Abd Soft, No tenderness,    Extremity/Skin:- No  edema,   good pulses Psych-affect is appropriate, oriented x3 Neuro-   Dense left-sided hemiplegia, facial droop   Data Review:   Micro Results No results found for this or any previous visit (from the past 240 hour(s)).  Radiology Reports Ct Angio Head W Or Wo Contrast  Result Date: 10/13/2017 CLINICAL DATA:  84 y/o F; left-sided facial droop and slurred speech. EXAM: CT ANGIOGRAPHY HEAD AND NECK TECHNIQUE: Multidetector CT imaging of the head and neck was performed using the standard protocol during bolus  administration of intravenous contrast. Multiplanar CT image reconstructions and MIPs were obtained to evaluate the vascular anatomy. Carotid stenosis measurements (when applicable) are obtained utilizing NASCET criteria, using the distal internal carotid diameter as the denominator. CONTRAST:  39mL ISOVUE-370 IOPAMIDOL (ISOVUE-370) INJECTION 76% COMPARISON:  10/12/2017 CT head.  10/13/2017 MRI head. FINDINGS: CTA NECK FINDINGS Aortic arch: Bovine variant branching. Imaged portion shows no evidence of aneurysm or dissection. Left subclavian artery extensive calcified plaque with moderate to severe 70% proximal stenosis. Right carotid system: No evidence of dissection, stenosis (50% or greater) or occlusion. Extensive calcified plaque of the carotid bifurcation with mild less than 50% proximal ICA stenosis. Left carotid system: No evidence of dissection or occlusion. Severe calcified plaque of the left carotid bifurcation with moderate 50-70% proximal ICA stenosis.  Vertebral arteries: Calcified plaque of the bilateral vertebral arteries with severe stenosis of the vertebral artery origins bilaterally. Mild calcific atherosclerosis of the left V1 segment. Skeleton: Moderate cervical spondylosis with disc and facet degenerative changes greatest at the C4-C7 levels. No high-grade bony canal stenosis. Other neck: Negative. Upper chest: Negative. Review of the MIP images confirms the above findings CTA HEAD FINDINGS Anterior circulation: Right MCA distribution mid to distal M2 branch occlusion within the mid sylvian fissure (series 12, image 10). No additional large vessel occlusion, aneurysm, or high-grade stenosis. Calcified plaque of the carotid siphons with mild less than 50% paraclinoid stenosis. Posterior circulation: No significant stenosis, proximal occlusion, aneurysm, or vascular malformation. Venous sinuses: As permitted by contrast timing, patent. Anatomic variants: Complete circle-of-[place]. Delayed phase: No  abnormal intracranial enhancement. Review of the MIP images confirms the above findings IMPRESSION: 1. Right mid to distal M2 branch occlusion within mid sylvian fissure. 2. No additional intracranial large vessel occlusion, aneurysm, or significant stenosis. 3. Bilateral vertebral artery origin severe stenosis with calcified plaque. 4. Right proximal ICA mild less than 50% stenosis with calcified plaque. 5. Left proximal ICA moderate 50-70% stenosis with calcified plaque. 6. Left subclavian artery severe greater than 70% proximal stenosis with calcified plaque. These results will be called to the ordering clinician or representative by the Radiologist Assistant, and communication documented in the PACS or zVision Dashboard. Electronically Signed   By: Kristine Garbe M.D.   On: 10/13/2017 03:41   Dg Chest 2 View  Result Date: 10/12/2017 CLINICAL DATA:  84 y/o F; patient presents with left-sided weakness tonight. Back pain from multiple intra discs. EXAM: CHEST - 2 VIEW COMPARISON:  None. FINDINGS: Mild cardiomegaly given projection and technique. Calcific aortic atherosclerosis. No consolidation, effusion, or pneumothorax. Lower thoracic, probably T12 and L1 mild anterior compression deformities, age indeterminate. IMPRESSION: Lower thoracic, probably T12-L1 mild anterior compression deformities, age indeterminate. No acute pulmonary process identified. Electronically Signed   By: Kristine Garbe M.D.   On: 10/12/2017 23:17   Ct Head Wo Contrast  Result Date: 10/14/2017 CLINICAL DATA:  Follow-up examination for stroke. EXAM: CT HEAD WITHOUT CONTRAST TECHNIQUE: Contiguous axial images were obtained from the base of the skull through the vertex without intravenous contrast. COMPARISON:  Prior studies from 10/13/2017. FINDINGS: Brain: Evolving cytotoxic edema involving the mid and posterior right MCA distribution, consistent with acute to early subacute right MCA territory infarct. Overall,  size appears somewhat increased relative to previous MRI. No associated hemorrhage or significant mass effect. No other acute large vessel territory infarct. No mass lesion, midline shift or mass effect. No hydrocephalus. No extra-axial fluid collection. Underlying atrophy with chronic small vessel ischemic disease again noted. Vascular: Punctate hyperdensity within the distal right sylvian fissure, likely intravascular thrombus. Scattered vascular calcifications noted within the carotid siphons. Skull: Unremarkable. Sinuses/Orbits: Globes and orbital soft tissues demonstrate no acute finding. Paranasal sinuses and mastoid air cells are clear. Other: None. IMPRESSION: 1. Evolving moderate sized acute to early subacute ischemic right MCA territory infarct, slightly increased in size relative to previous MRI. No significant edema or evidence for hemorrhagic transformation. 2. Otherwise stable appearance of the brain. No other acute intracranial abnormality identified. Electronically Signed   By: Jeannine Boga M.D.   On: 10/14/2017 14:05   Ct Head Wo Contrast  Result Date: 10/12/2017 CLINICAL DATA:  Hit head.  Fall. EXAM: CT HEAD WITHOUT CONTRAST TECHNIQUE: Contiguous axial images were obtained from the base of the skull through the vertex without intravenous  contrast. COMPARISON:  None. FINDINGS: Brain: There is atrophy and chronic small vessel disease changes. No acute intracranial abnormality. Specifically, no hemorrhage, hydrocephalus, mass lesion, acute infarction, or significant intracranial injury. Vascular: No hyperdense vessel or unexpected calcification. Skull: No acute calvarial abnormality. Sinuses/Orbits: Visualized paranasal sinuses and mastoids clear. Orbital soft tissues unremarkable. Other: None IMPRESSION: No acute intracranial abnormality. Atrophy, chronic microvascular disease. Electronically Signed   By: Rolm Baptise M.D.   On: 10/12/2017 20:53   Ct Angio Neck W Or Wo  Contrast  Result Date: 10/13/2017 CLINICAL DATA:  84 y/o F; left-sided facial droop and slurred speech. EXAM: CT ANGIOGRAPHY HEAD AND NECK TECHNIQUE: Multidetector CT imaging of the head and neck was performed using the standard protocol during bolus administration of intravenous contrast. Multiplanar CT image reconstructions and MIPs were obtained to evaluate the vascular anatomy. Carotid stenosis measurements (when applicable) are obtained utilizing NASCET criteria, using the distal internal carotid diameter as the denominator. CONTRAST:  44mL ISOVUE-370 IOPAMIDOL (ISOVUE-370) INJECTION 76% COMPARISON:  10/12/2017 CT head.  10/13/2017 MRI head. FINDINGS: CTA NECK FINDINGS Aortic arch: Bovine variant branching. Imaged portion shows no evidence of aneurysm or dissection. Left subclavian artery extensive calcified plaque with moderate to severe 70% proximal stenosis. Right carotid system: No evidence of dissection, stenosis (50% or greater) or occlusion. Extensive calcified plaque of the carotid bifurcation with mild less than 50% proximal ICA stenosis. Left carotid system: No evidence of dissection or occlusion. Severe calcified plaque of the left carotid bifurcation with moderate 50-70% proximal ICA stenosis. Vertebral arteries: Calcified plaque of the bilateral vertebral arteries with severe stenosis of the vertebral artery origins bilaterally. Mild calcific atherosclerosis of the left V1 segment. Skeleton: Moderate cervical spondylosis with disc and facet degenerative changes greatest at the C4-C7 levels. No high-grade bony canal stenosis. Other neck: Negative. Upper chest: Negative. Review of the MIP images confirms the above findings CTA HEAD FINDINGS Anterior circulation: Right MCA distribution mid to distal M2 branch occlusion within the mid sylvian fissure (series 12, image 10). No additional large vessel occlusion, aneurysm, or high-grade stenosis. Calcified plaque of the carotid siphons with mild less  than 50% paraclinoid stenosis. Posterior circulation: No significant stenosis, proximal occlusion, aneurysm, or vascular malformation. Venous sinuses: As permitted by contrast timing, patent. Anatomic variants: Complete circle-of-[place]. Delayed phase: No abnormal intracranial enhancement. Review of the MIP images confirms the above findings IMPRESSION: 1. Right mid to distal M2 branch occlusion within mid sylvian fissure. 2. No additional intracranial large vessel occlusion, aneurysm, or significant stenosis. 3. Bilateral vertebral artery origin severe stenosis with calcified plaque. 4. Right proximal ICA mild less than 50% stenosis with calcified plaque. 5. Left proximal ICA moderate 50-70% stenosis with calcified plaque. 6. Left subclavian artery severe greater than 70% proximal stenosis with calcified plaque. These results will be called to the ordering clinician or representative by the Radiologist Assistant, and communication documented in the PACS or zVision Dashboard. Electronically Signed   By: Kristine Garbe M.D.   On: 10/13/2017 03:41   Mr Brain Wo Contrast  Result Date: 10/13/2017 CLINICAL DATA:  84 y/o  F; left-sided weakness. EXAM: MRI HEAD WITHOUT CONTRAST TECHNIQUE: Multiplanar, multiecho pulse sequences of the brain and surrounding structures were obtained without intravenous contrast. COMPARISON:  10/12/2017 CT head. FINDINGS: Brain: Reduced diffusion within the right posterior insula, frontal operculum, and posterolateral frontal lobe compatible with acute/early subacute infarction. No associated hemorrhage or mass effect. Several nonspecific T2 FLAIR hyperintensities in subcortical and periventricular white matter are compatible with  mild chronic microvascular ischemic changes for age. Mild volume loss of the brain. Vascular: Linear focus of susceptibility blooming measuring 7 mm in the right sylvian fissure, likely a vessel thrombus (series 10, image 29). Skull and upper cervical  spine: Normal marrow signal. Sinuses/Orbits: Negative.  Bilateral intra-ocular lens replacement. Other: None. IMPRESSION: 1. Acute/early subacute infarction involving the right posterior insula, frontal operculum, and posterolateral frontal lobe. No hemorrhage or mass effect. 2. 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 3. Mild for age chronic microvascular ischemic changes and volume loss of the brain. These results will be called to the ordering clinician or representative by the Radiologist Assistant, and communication documented in the PACS or zVision Dashboard. Electronically Signed   By: Kristine Garbe M.D.   On: 10/13/2017 01:42     CBC Recent Labs  Lab 10/12/17 2034  WBC 12.7*  HGB 13.9  HCT 45.5  PLT 221  MCV 91.7  MCH 28.0  MCHC 30.5  RDW 13.3  LYMPHSABS 1.6  MONOABS 0.9  EOSABS 0.2  BASOSABS 0.1    Chemistries  Recent Labs  Lab 10/12/17 2034  NA 141  K 4.3  CL 106  CO2 26  GLUCOSE 112*  BUN 19  CREATININE 1.00  CALCIUM 9.2  AST 27  ALT 17  ALKPHOS 74  BILITOT 1.1   ------------------------------------------------------------------------------------------------------------------ Recent Labs    10/13/17 0217  CHOL 145  HDL 38*  LDLCALC 76  TRIG 156*  CHOLHDL 3.8    Lab Results  Component Value Date   HGBA1C 5.9 (H) 10/13/2017   ------------------------------------------------------------------------------------------------------------------ No results for input(s): TSH, T4TOTAL, T3FREE, THYROIDAB in the last 72 hours.  Invalid input(s): FREET3 ------------------------------------------------------------------------------------------------------------------ No results for input(s): VITAMINB12, FOLATE, FERRITIN, TIBC, IRON, RETICCTPCT in the last 72 hours.  Coagulation profile Recent Labs  Lab 10/12/17 2034  INR 1.03    No results for input(s): DDIMER in the last  72 hours.  Cardiac Enzymes Recent Labs  Lab 10/13/17 0217 10/13/17 0611 10/13/17 1242  TROPONINI 0.13* 0.15* 0.09*   ------------------------------------------------------------------------------------------------------------------ No results found for: BNP   Roxan Hockey M.D on 10/14/2017 at 2:48 PM  Pager---[phone] Go to [email or web address] - password [email or web address] for contact info  [facility] - Office  [phone]

## 2017-10-14 NOTE — Progress Notes (Signed)
SLP Cancellation Note  Patient Details Name: [name] MRN: [id number] DOB: 13-Jun-1932   Cancelled treatment:       Reason Eval/Treat Not Completed: Patient at procedure or test/unavailable. Pt leaving the unit at this time for testing. Orders for BSE received. Will continue efforts.  Dharma Pare B. Quentin Ore [facility], CCC-SLP Speech Language Pathologist [phone]  Shonna Chock 10/14/2017, 11:55 AM

## 2017-10-14 NOTE — NC FL2 (Signed)
[place] MEDICAID FL2 LEVEL OF CARE SCREENING TOOL     IDENTIFICATION  Patient Name: Sonya [name] Birthdate: November 19, 1932 Sex: female Admission Date (Current Location): 10/12/2017  [facility] and [place] Number:  Engineering geologist and Address:  [facility] [place], [place] [address] [place], [place] [address]      Provider Number: [id number]  Attending Physician Name and Address:  Roxan Hockey, MD  Relative Name and Phone Number:  [name], son, [phone]    Current Level of Care: Hospital Recommended Level of Care: [place] Prior Approval Number:    Date Approved/Denied:   PASRR Number: [id number]  Discharge Plan: SNF    Current Diagnoses: Patient Active Problem List   Diagnosis Date Noted  . Cerebral embolism with cerebral infarction 10/13/2017  . CVA (cerebral vascular accident) ([place]) 10/13/2017  . Hypertension 10/12/2017  . Hypertensive urgency 10/12/2017  . Hypothyroidism 10/12/2017  . Left-sided weakness 10/12/2017  . Elevated troponin 10/12/2017  . Unknown when suspected stroke patient was last well 10/12/2017    Orientation RESPIRATION BLADDER Height & Weight     Self, Time, Situation, Place  Normal External catheter, Continent Weight: 56.1 kg Height:  5' (152.4 cm)  BEHAVIORAL SYMPTOMS/MOOD NEUROLOGICAL BOWEL NUTRITION STATUS      Continent Diet(Please see DC Summary)  AMBULATORY STATUS COMMUNICATION OF NEEDS Skin   Extensive Assist Verbally Normal                       Personal Care Assistance Level of Assistance  Bathing, Feeding, Dressing Bathing Assistance: Maximum assistance Feeding assistance: Limited assistance Dressing Assistance: Limited assistance     Functional Limitations Info  Sight, Hearing, Speech Sight Info: Adequate Hearing Info: Adequate Speech Info: Adequate    SPECIAL CARE FACTORS FREQUENCY  PT (By licensed PT), OT (By licensed OT), Speech therapy     PT Frequency: 5x/week OT  Frequency: 3x/week     Speech Therapy Frequency: 2x/week      Contractures      Additional Factors Info  Code Status, Allergies, Psychotropic Code Status Info: Full Allergies Info: Clopidogrel, Omeprazole Psychotropic Info: clonidine; effexor         Current Medications (10/14/2017):  This is the current hospital active medication list Current Facility-Administered Medications  Medication Dose Route Frequency Provider Last Rate Last Dose  . acetaminophen (TYLENOL) tablet 650 mg  650 mg Oral Q4H PRN Opyd, Ilene Qua, MD   650 mg at 10/13/17 0028   Or  . acetaminophen (TYLENOL) solution 650 mg  650 mg Per Tube Q4H PRN Opyd, Ilene Qua, MD       Or  . acetaminophen (TYLENOL) suppository 650 mg  650 mg Rectal Q4H PRN Opyd, Ilene Qua, MD      . aspirin suppository 300 mg  300 mg Rectal Daily Opyd, Ilene Qua, MD       Or  . aspirin tablet 325 mg  325 mg Oral Daily Opyd, Ilene Qua, MD   325 mg at 10/14/17 1006  . B-complex with vitamin C tablet 1 tablet  1 tablet Oral Daily Rosalin Hawking, MD   1 tablet at 10/14/17 1006  . benazepril (LOTENSIN) tablet 20 mg  20 mg Oral Daily Rosalin Hawking, MD   20 mg at 10/14/17 1006  . butalbital-acetaminophen-caffeine (FIORICET, ESGIC) 50-325-40 MG per tablet 1 tablet  1 tablet Oral Q12H PRN Rosalin Hawking, MD      . cholecalciferol (VITAMIN D) tablet 3,000 Units  3,000  Units Oral QHS Vianne Bulls, MD   3,000 Units at 10/13/17 2322  . cloNIDine (CATAPRES) tablet 0.2 mg  0.2 mg Oral BID Rosalin Hawking, MD   0.2 mg at 10/14/17 1006  . enoxaparin (LOVENOX) injection 40 mg  40 mg Subcutaneous Q24H Opyd, Ilene Qua, MD   40 mg at 10/13/17 2322  . labetalol (NORMODYNE,TRANDATE) injection 5-10 mg  5-10 mg Intravenous Q2H PRN Opyd, Ilene Qua, MD      . levothyroxine (SYNTHROID, LEVOTHROID) tablet 25 mcg  25 mcg Oral QAC breakfast Opyd, Ilene Qua, MD   25 mcg at 10/14/17 1006  . metoprolol tartrate (LOPRESSOR) tablet 25 mg  25 mg Oral BID Rosalin Hawking, MD   25 mg at  10/14/17 1006  . neomycin-bacitracin-polymyxin (NEOSPORIN) ointment 1 application  1 application Topical Daily Opyd, Ilene Qua, MD   1 application at 92/92/44 1007  . ondansetron (ZOFRAN) injection 4 mg  4 mg Intravenous Q6H PRN Gardiner Barefoot, NP   4 mg at 10/14/17 1003  . pantoprazole (PROTONIX) EC tablet 20 mg  20 mg Oral Daily Opyd, Ilene Qua, MD   20 mg at 10/14/17 1006  . senna-docusate (Senokot-S) tablet 1 tablet  1 tablet Oral QHS PRN Opyd, Ilene Qua, MD      . simvastatin (ZOCOR) tablet 40 mg  40 mg Oral q1800 Burnetta Sabin L, NP   40 mg at 10/14/17 1708  . venlafaxine XR (EFFEXOR-XR) 24 hr capsule 150 mg  150 mg Oral QHS Opyd, Ilene Qua, MD   150 mg at 10/13/17 2324     Discharge Medications: Please see discharge summary for a list of discharge medications.  Relevant Imaging Results:  Relevant Lab Results:   Additional Information SSN: [place], [place]

## 2017-10-14 NOTE — Plan of Care (Signed)

## 2017-10-14 NOTE — Evaluation (Signed)
Clinical/Bedside Swallow Evaluation Patient Details  Name: Sonya [name] MRN: [id number] Date of Birth: [birth year]  Today's Date: 10/14/2017 Time: SLP Start Time (ACUTE ONLY): 1400 SLP Stop Time (ACUTE ONLY): 1435 SLP Time Calculation (min) (ACUTE ONLY): 35 min  Past Medical History:  Past Medical History:  Diagnosis Date  . Hypertension    Past Surgical History: History reviewed. No pertinent surgical history. HPI:  Sonya [name] is a 84 y.o. female with medical history significant for hypothyroidism, hypertension, and anxiety, who presentied to the emergency department with left-sided weakness.  Patient reports that she has been generally weak and fatigued for the past week or so, family had noted that the patient had a left facial droop with dysarthria and appeared to be dragging her left leg.    She had a fall out of her bed early this morning without any significant injury or loss of consciousness.  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 No hemorrhage or mass effect.   Assessment / Plan / Recommendation Clinical Impression  Pt seen for assessment of swallow function and safety, given change in neuro presentation since yesterday. Upon arrival of SLP, son was feeding pt lunch. Pt was noted to be reclined in the bed with lettuce at the left corner of her mouth. SLP repositioned pt to more upright, and completed oral care with suction. Pt was then given trials of thin liquid, puree, and solid consistencies. Thin liquid and puree trials were tolerated well with mild oral difficulty and no overt s/s aspiration. Cake/icing resulted in anterior leakage, poor bolus formation and control, and left lateral pocketing. Liquid wash was not effective to remove residue. Oral suction was completed to remove cake from oral cavity. Recommend puree diet and thin liquids, crushed meds. Oral care with suction is  recommended before meals to minimize bacterial load, and after meals to remove oral residue. Safe swallow precautions posted at [facility] and reviewed with pt/family. Recommend 1:1 assistance with meals. SLP will follow for diet tolerance and education. RN and MD informed of results and recommendations.    SLP Visit Diagnosis: Dysphagia, unspecified (R13.10)    Aspiration Risk  Moderate aspiration risk    Diet Recommendation Dysphagia 1 (Puree);Thin liquid   Liquid Administration via: Straw Medication Administration: Crushed with puree Supervision: Full supervision/cueing for compensatory strategies;Staff to assist with self feeding Compensations: Minimize environmental distractions;Slow rate;Small sips/bites;Follow solids with liquid;Lingual sweep for clearance of pocketing Postural Changes: Remain upright for at least 30 minutes after po intake;Seated upright at 90 degrees    Other  Recommendations Oral Care Recommendations: Oral care before and after PO Other Recommendations: Have oral suction available   Follow up Recommendations Inpatient Rehab      Frequency and Duration min 2x/week  2 weeks       Prognosis Prognosis for Safe Diet Advancement: Fair      Swallow Study   General Date of Onset: 10/12/17 HPI: Sonya [name] is a 84 y.o. female with medical history significant for hypothyroidism, hypertension, and anxiety, who presentied to the emergency department with left-sided weakness.  Patient reports that she has been generally weak and fatigued for the past week or so, family had noted that the patient had a left facial droop with dysarthria and appeared to be dragging her left leg.    She had a fall out of her bed early this morning without any significant injury or loss of consciousness.  MRI findings of Acute/early subacute  infarction involving the right posterior insula, frontal operculum, and posterolateral frontal lobe. No hemorrhage or mass effect. Type of Study: Bedside  Swallow Evaluation Previous Swallow Assessment: none Diet Prior to this Study: Regular;Thin liquids Temperature Spikes Noted: No Respiratory Status: Room air History of Recent Intubation: No Behavior/Cognition: Alert;Cooperative;Pleasant mood Oral Cavity Assessment: (lettuce pocketed on left) Oral Care Completed by SLP: Yes Oral Cavity - Dentition: Missing dentition;Poor condition Vision: Functional for self-feeding Self-Feeding Abilities: Needs assist;Needs set up Patient Positioning: Upright in bed Baseline Vocal Quality: Normal Volitional Cough: Weak Volitional Swallow: Able to elicit    Oral/Motor/Sensory Function Overall Oral Motor/Sensory Function: Moderate impairment Facial ROM: Reduced left Facial Symmetry: Abnormal symmetry left Facial Strength: Reduced left Lingual ROM: Reduced left Lingual Symmetry: Abnormal symmetry left Lingual Strength: Suspected CN XII (hypoglossal) dysfunction;Reduced Mandible: Within Functional Limits   Ice Chips Ice chips: Not tested   Thin Liquid Thin Liquid: Within functional limits Presentation: Straw Other Comments: cup not presented, given left labial/facial weakness    Nectar Thick Nectar Thick Liquid: Not tested   Honey Thick Honey Thick Liquid: Not tested   Puree Puree: Within functional limits Presentation: Spoon   Solid     Solid: Impaired Oral Phase Impairments: Reduced labial seal;Reduced lingual movement/coordination;Poor awareness of bolus;Impaired mastication Oral Phase Functional Implications: Left lateral sulci pocketing;Prolonged oral transit;Oral residue;Impaired mastication;Left anterior spillage     Sonya [name] B. Murvin Natal, [facility], CCC-SLP Speech Language Pathologist [phone]  Leigh Aurora 10/14/2017,2:52 PM

## 2017-10-15 ENCOUNTER — Inpatient Hospital Stay (HOSPITAL_COMMUNITY): Payer: Medicare PPO

## 2017-10-15 DIAGNOSIS — I639 Cerebral infarction, unspecified: Secondary | ICD-10-CM

## 2017-10-15 MED ORDER — SIMVASTATIN 40 MG PO TABS: 40.0000 mg | ORAL_TABLET | Freq: Every day | ORAL | 3 refills | Status: DC

## 2017-10-15 MED ORDER — METOPROLOL TARTRATE 25 MG PO TABS: 25.0000 mg | ORAL_TABLET | Freq: Two times a day (BID) | ORAL | 2 refills | Status: DC

## 2017-10-15 MED ORDER — SENNOSIDES-DOCUSATE SODIUM 8.6-50 MG PO TABS: 2.0000 | ORAL_TABLET | Freq: Every day | ORAL | 2 refills | Status: DC

## 2017-10-15 MED ORDER — CLONIDINE HCL 0.2 MG PO TABS: 0.2000 mg | ORAL_TABLET | Freq: Two times a day (BID) | ORAL | 3 refills | Status: DC

## 2017-10-15 MED ORDER — ASPIRIN 325 MG PO TABS: 325.0000 mg | ORAL_TABLET | Freq: Every day | ORAL | 2 refills | Status: DC

## 2017-10-15 MED ORDER — ACETAMINOPHEN 325 MG PO TABS: 650.0000 mg | ORAL_TABLET | ORAL | 3 refills | Status: DC | PRN

## 2017-10-15 NOTE — Clinical Social Work Placement (Signed)
   CLINICAL SOCIAL WORK PLACEMENT  NOTE  Date:  10/15/2017  Patient Details  Name: Sonya [name] MRN: [id number] Date of Birth: [birth year]  Clinical Social Work is seeking post-discharge placement for this patient at the [place] level of care (*CSW will initial, date and re-position this form in  chart as items are completed):  Yes   Patient/family provided with [place] Work Department's list of facilities offering this level of care within the geographic area requested by the patient (or if unable, by the patient's family).  Yes   Patient/family informed of their freedom to choose among providers that offer the needed level of care, that participate in Medicare, Medicaid or managed care program needed by the patient, have an available bed and are willing to accept the patient.  Yes   Patient/family informed of [place]'s ownership interest in [facility] and [facility], as well as of the fact that they are under no obligation to receive care at these facilities.  PASRR submitted to EDS on 10/14/17     PASRR number received on 10/14/17     Existing PASRR number confirmed on       FL2 transmitted to all facilities in geographic area requested by pt/family on 10/14/17     FL2 transmitted to all facilities within larger geographic area on       Patient informed that his/her managed care company has contracts with or will negotiate with certain facilities, including the following:        Yes   Patient/family informed of bed offers received.  Patient chooses bed at [facility]     Physician recommends and patient chooses bed at      Patient to be transferred to [facility] on 10/15/17.  Patient to be transferred to facility by [facility]     Patient family notified on 10/15/17 of transfer.  Name of family member notified:  Son, [name]     PHYSICIAN       Additional Comment:     _______________________________________________ Benard Halsted, [place] 10/15/2017, 2:19 PM

## 2017-10-15 NOTE — Progress Notes (Addendum)
  Speech Language Pathology Treatment: Dysphagia  Patient Details Name: Sonya [name] MRN: [id number] DOB: 01-02-1933 Today's Date: 10/15/2017 Time: 9476-5465 SLP Time Calculation (min) (ACUTE ONLY): 19 min  Assessment / Plan / Recommendation Clinical Impression  Pt seen at bedside for follow up after BSE completed 10/14/17. No family present, and pt reports headache is slightly better today. Speech is fully intelligible, although mildly dysarthric due to left oral motor weakness. Pt was given trials of thin liquid and puree, and tolerated each without anterior leakage orally, pocketing, or overt s/s aspiration. Pt reports she gets "strangled easily", and was encouraged to place the straw on the right (stronger) side, and take small individual sips. Current diet continues to be appropriate. SLP will follow for readiness for advancement and education. Safe swallow precautions at [facility].    HPI HPI: Sonya [name] is a 84 y.o. female with medical history significant for hypothyroidism, hypertension, and anxiety, who presentied to the emergency department with left-sided weakness.  Patient reports that she has been generally weak and fatigued for the past week or so, family had noted that the patient had a left facial droop with dysarthria and appeared to be dragging her left leg.    She had a fall out of her bed early this morning without any significant injury or loss of consciousness.  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 No hemorrhage or mass effect.      SLP Plan  Continue with current plan of care       Recommendations  Diet recommendations: Dysphagia 1 (puree);Thin liquid Liquids provided via: Straw Medication Administration: Crushed with puree Supervision: Full supervision/cueing for compensatory strategies Compensations: Minimize environmental distractions;Slow rate;Small  sips/bites;Follow solids with liquid;Lingual sweep for clearance of pocketing Postural Changes and/or Swallow Maneuvers: Seated upright 90 degrees;Upright 30-60 min after meal                Oral Care Recommendations: Oral care before and after PO Follow up Recommendations: Inpatient Rehab SLP Visit Diagnosis: Dysphagia, unspecified (R13.10) Plan: Continue with current plan of care       [place] Qu[facility], CCC-SLP Speech Language Pathologist [phone]  Shonna Chock 10/15/2017, 12:44 PM

## 2017-10-15 NOTE — Progress Notes (Signed)
STROKE TEAM PROGRESS NOTE   INTERVAL HISTORY No family is at bedside. Pt lying in the bed. Still has right gaze, left neglect, left hemiplegia. More awake alert than yesterday. LE venous doppler negative for DVT.   Vitals:   10/14/17 2130 10/15/17 0531 10/15/17 1110 10/15/17 1236  BP: (!) 147/68 (!) 189/64 (!) 187/64 (!) 164/63  Pulse: 65 67 66 63  Resp: 17 17  18   Temp: 97.7 F (36.5 C) 98 F (36.7 C)  97.9 F (36.6 C)  TempSrc:      SpO2: 95% 92%  94%  Weight:      Height:        CBC:  Recent Labs  Lab 10/12/17 2034  WBC 12.7*  NEUTROABS 9.8*  HGB 13.9  HCT 45.5  MCV 91.7  PLT 025    Basic Metabolic Panel:  Recent Labs  Lab 10/12/17 2034  NA 141  K 4.3  CL 106  CO2 26  GLUCOSE 112*  BUN 19  CREATININE 1.00  CALCIUM 9.2   Lipid Panel:     Component Value Date/Time   CHOL 145 10/13/2017 0217   TRIG 156 (H) 10/13/2017 0217   HDL 38 (L) 10/13/2017 0217   CHOLHDL 3.8 10/13/2017 0217   VLDL 31 10/13/2017 0217   LDLCALC 76 10/13/2017 0217   HgbA1c:  Lab Results  Component Value Date   HGBA1C 5.9 (H) 10/13/2017   Urine Drug Screen:     Component Value Date/Time   LABOPIA NONE DETECTED 10/12/2017 2203   COCAINSCRNUR NONE DETECTED 10/12/2017 2203   LABBENZ NONE DETECTED 10/12/2017 2203   AMPHETMU NONE DETECTED 10/12/2017 2203   THCU NONE DETECTED 10/12/2017 2203   LABBARB NONE DETECTED 10/12/2017 2203    Alcohol Level     Component Value Date/Time   ETH <10 10/12/2017 2034    IMAGING Ct Angio Head W Or Wo Contrast  Result Date: 10/13/2017 CLINICAL DATA:  84 y/o F; left-sided facial droop and slurred speech. EXAM: CT ANGIOGRAPHY HEAD AND NECK TECHNIQUE: Multidetector CT imaging of the head and neck was performed using the standard protocol during bolus administration of intravenous contrast. Multiplanar CT image reconstructions and MIPs were obtained to evaluate the vascular anatomy. Carotid stenosis measurements (when applicable) are obtained  utilizing NASCET criteria, using the distal internal carotid diameter as the denominator. CONTRAST:  61mL ISOVUE-370 IOPAMIDOL (ISOVUE-370) INJECTION 76% COMPARISON:  10/12/2017 CT head.  10/13/2017 MRI head. FINDINGS: CTA NECK FINDINGS Aortic arch: Bovine variant branching. Imaged portion shows no evidence of aneurysm or dissection. Left subclavian artery extensive calcified plaque with moderate to severe 70% proximal stenosis. Right carotid system: No evidence of dissection, stenosis (50% or greater) or occlusion. Extensive calcified plaque of the carotid bifurcation with mild less than 50% proximal ICA stenosis. Left carotid system: No evidence of dissection or occlusion. Severe calcified plaque of the left carotid bifurcation with moderate 50-70% proximal ICA stenosis. Vertebral arteries: Calcified plaque of the bilateral vertebral arteries with severe stenosis of the vertebral artery origins bilaterally. Mild calcific atherosclerosis of the left V1 segment. Skeleton: Moderate cervical spondylosis with disc and facet degenerative changes greatest at the C4-C7 levels. No high-grade bony canal stenosis. Other neck: Negative. Upper chest: Negative. Review of the MIP images confirms the above findings CTA HEAD FINDINGS Anterior circulation: Right MCA distribution mid to distal M2 branch occlusion within the mid sylvian fissure (series 12, image 10). No additional large vessel occlusion, aneurysm, or high-grade stenosis. Calcified plaque of the carotid siphons with  mild less than 50% paraclinoid stenosis. Posterior circulation: No significant stenosis, proximal occlusion, aneurysm, or vascular malformation. Venous sinuses: As permitted by contrast timing, patent. Anatomic variants: Complete circle-of-Willis. Delayed phase: No abnormal intracranial enhancement. Review of the MIP images confirms the above findings IMPRESSION: 1. Right mid to distal M2 branch occlusion within mid sylvian fissure. 2. No additional  intracranial large vessel occlusion, aneurysm, or significant stenosis. 3. Bilateral vertebral artery origin severe stenosis with calcified plaque. 4. Right proximal ICA mild less than 50% stenosis with calcified plaque. 5. Left proximal ICA moderate 50-70% stenosis with calcified plaque. 6. Left subclavian artery severe greater than 70% proximal stenosis with calcified plaque. These results will be called to the ordering clinician or representative by the Radiologist Assistant, and communication documented in the PACS or zVision Dashboard. Electronically Signed   By: Kristine Garbe M.D.   On: 10/13/2017 03:41   Dg Chest 2 View  Result Date: 10/12/2017 CLINICAL DATA:  84 y/o F; patient presents with left-sided weakness tonight. Back pain from multiple intra discs. EXAM: CHEST - 2 VIEW COMPARISON:  None. FINDINGS: Mild cardiomegaly given projection and technique. Calcific aortic atherosclerosis. No consolidation, effusion, or pneumothorax. Lower thoracic, probably T12 and L1 mild anterior compression deformities, age indeterminate. IMPRESSION: Lower thoracic, probably T12-L1 mild anterior compression deformities, age indeterminate. No acute pulmonary process identified. Electronically Signed   By: Kristine Garbe M.D.   On: 10/12/2017 23:17   Ct Head Wo Contrast  Result Date: 10/14/2017 CLINICAL DATA:  Follow-up examination for stroke. EXAM: CT HEAD WITHOUT CONTRAST TECHNIQUE: Contiguous axial images were obtained from the base of the skull through the vertex without intravenous contrast. COMPARISON:  Prior studies from 10/13/2017. FINDINGS: Brain: Evolving cytotoxic edema involving the mid and posterior right MCA distribution, consistent with acute to early subacute right MCA territory infarct. Overall, size appears somewhat increased relative to previous MRI. No associated hemorrhage or significant mass effect. No other acute large vessel territory infarct. No mass lesion, midline shift  or mass effect. No hydrocephalus. No extra-axial fluid collection. Underlying atrophy with chronic small vessel ischemic disease again noted. Vascular: Punctate hyperdensity within the distal right sylvian fissure, likely intravascular thrombus. Scattered vascular calcifications noted within the carotid siphons. Skull: Unremarkable. Sinuses/Orbits: Globes and orbital soft tissues demonstrate no acute finding. Paranasal sinuses and mastoid air cells are clear. Other: None. IMPRESSION: 1. Evolving moderate sized acute to early subacute ischemic right MCA territory infarct, slightly increased in size relative to previous MRI. No significant edema or evidence for hemorrhagic transformation. 2. Otherwise stable appearance of the brain. No other acute intracranial abnormality identified. Electronically Signed   By: Jeannine Boga M.D.   On: 10/14/2017 14:05   Ct Head Wo Contrast  Result Date: 10/12/2017 CLINICAL DATA:  Hit head.  Fall. EXAM: CT HEAD WITHOUT CONTRAST TECHNIQUE: Contiguous axial images were obtained from the base of the skull through the vertex without intravenous contrast. COMPARISON:  None. FINDINGS: Brain: There is atrophy and chronic small vessel disease changes. No acute intracranial abnormality. Specifically, no hemorrhage, hydrocephalus, mass lesion, acute infarction, or significant intracranial injury. Vascular: No hyperdense vessel or unexpected calcification. Skull: No acute calvarial abnormality. Sinuses/Orbits: Visualized paranasal sinuses and mastoids clear. Orbital soft tissues unremarkable. Other: None IMPRESSION: No acute intracranial abnormality. Atrophy, chronic microvascular disease. Electronically Signed   By: Rolm Baptise M.D.   On: 10/12/2017 20:53   Ct Angio Neck W Or Wo Contrast  Result Date: 10/13/2017 CLINICAL DATA:  84 y/o F; left-sided facial  droop and slurred speech. EXAM: CT ANGIOGRAPHY HEAD AND NECK TECHNIQUE: Multidetector CT imaging of the head and neck was  performed using the standard protocol during bolus administration of intravenous contrast. Multiplanar CT image reconstructions and MIPs were obtained to evaluate the vascular anatomy. Carotid stenosis measurements (when applicable) are obtained utilizing NASCET criteria, using the distal internal carotid diameter as the denominator. CONTRAST:  49mL ISOVUE-370 IOPAMIDOL (ISOVUE-370) INJECTION 76% COMPARISON:  10/12/2017 CT head.  10/13/2017 MRI head. FINDINGS: CTA NECK FINDINGS Aortic arch: Bovine variant branching. Imaged portion shows no evidence of aneurysm or dissection. Left subclavian artery extensive calcified plaque with moderate to severe 70% proximal stenosis. Right carotid system: No evidence of dissection, stenosis (50% or greater) or occlusion. Extensive calcified plaque of the carotid bifurcation with mild less than 50% proximal ICA stenosis. Left carotid system: No evidence of dissection or occlusion. Severe calcified plaque of the left carotid bifurcation with moderate 50-70% proximal ICA stenosis. Vertebral arteries: Calcified plaque of the bilateral vertebral arteries with severe stenosis of the vertebral artery origins bilaterally. Mild calcific atherosclerosis of the left V1 segment. Skeleton: Moderate cervical spondylosis with disc and facet degenerative changes greatest at the C4-C7 levels. No high-grade bony canal stenosis. Other neck: Negative. Upper chest: Negative. Review of the MIP images confirms the above findings CTA HEAD FINDINGS Anterior circulation: Right MCA distribution mid to distal M2 branch occlusion within the mid sylvian fissure (series 12, image 10). No additional large vessel occlusion, aneurysm, or high-grade stenosis. Calcified plaque of the carotid siphons with mild less than 50% paraclinoid stenosis. Posterior circulation: No significant stenosis, proximal occlusion, aneurysm, or vascular malformation. Venous sinuses: As permitted by contrast timing, patent. Anatomic  variants: Complete circle-of-Willis. Delayed phase: No abnormal intracranial enhancement. Review of the MIP images confirms the above findings IMPRESSION: 1. Right mid to distal M2 branch occlusion within mid sylvian fissure. 2. No additional intracranial large vessel occlusion, aneurysm, or significant stenosis. 3. Bilateral vertebral artery origin severe stenosis with calcified plaque. 4. Right proximal ICA mild less than 50% stenosis with calcified plaque. 5. Left proximal ICA moderate 50-70% stenosis with calcified plaque. 6. Left subclavian artery severe greater than 70% proximal stenosis with calcified plaque. These results will be called to the ordering clinician or representative by the Radiologist Assistant, and communication documented in the PACS or zVision Dashboard. Electronically Signed   By: Kristine Garbe M.D.   On: 10/13/2017 03:41   Mr Brain Wo Contrast  Result Date: 10/13/2017 CLINICAL DATA:  84 y/o  F; left-sided weakness. EXAM: MRI HEAD WITHOUT CONTRAST TECHNIQUE: Multiplanar, multiecho pulse sequences of the brain and surrounding structures were obtained without intravenous contrast. COMPARISON:  10/12/2017 CT head. FINDINGS: Brain: Reduced diffusion within the right posterior insula, frontal operculum, and posterolateral frontal lobe compatible with acute/early subacute infarction. No associated hemorrhage or mass effect. Several nonspecific T2 FLAIR hyperintensities in subcortical and periventricular white matter are compatible with mild chronic microvascular ischemic changes for age. Mild volume loss of the brain. Vascular: Linear focus of susceptibility blooming measuring 7 mm in the right sylvian fissure, likely a vessel thrombus (series 10, image 29). Skull and upper cervical spine: Normal marrow signal. Sinuses/Orbits: Negative.  Bilateral intra-ocular lens replacement. Other: None. IMPRESSION: 1. Acute/early subacute infarction involving the right posterior insula, frontal  operculum, and posterolateral frontal lobe. No hemorrhage or mass effect. 2. 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 3. Mild for age chronic microvascular ischemic changes  and volume loss of the brain. These results will be called to the ordering clinician or representative by the Radiologist Assistant, and communication documented in the PACS or zVision Dashboard. Electronically Signed   By: Kristine Garbe M.D.   On: 10/13/2017 01:42   2D Echocardiogram  - Left ventricle: The cavity size was normal. Wall thickness was increased in a pattern of mild LVH. Systolic function was normal. The estimated ejection fraction was in the range of 60% to 65%. Wall motion was normal; there were no regional wall motion abnormalities. Features are consistent with a pseudonormal left ventricular filling pattern, with concomitant abnormal relaxation and increased filling pressure (grade 2 diastolic dysfunction). E/medial e&' > 15, suggesting LV end diastolic pressure at least 20 mmHg. - Aortic valve: There was no stenosis. - Mitral valve: There was trivial regurgitation. - Left atrium: The atrium was mildly dilated. - Right ventricle: The cavity size was normal. Systolic function was normal. - Right atrium: The atrium was mildly dilated. - Tricuspid valve: Peak RV-RA gradient (S): 47 mm Hg. - Pulmonary arteries: PA peak pressure: 50 mm Hg (S). - Inferior vena cava: The vessel was normal in size. The respirophasic diameter changes were in the normal range (>= 50%), consistent with normal central venous pressure. Impressions: - Normal LV size with mild LV hypertrophy. EF 60-65%. Moderate diastolic dysfunction. Normal RV size and systolic function. No significant valvular abnormalities. Moderate pulmonary hypertension.  LE venous doppler  Negative for DVT   PHYSICAL EXAM General: Patient awake alert no distress HEENT: Normocephalic  atraumatic dry oral mucous membranes Lungs: Clear to auscultation Cardiovascular: S1-S2 heard, regular rate rhythm Extremities: Warm and well-perfused with some bleeding spots on the right leg.  Hyperpigmentation in both lower extremities with some chronic venous stasis changes. Bilateral ecchymotic areas on her extremities (from ASA)  NEUROLOGICAL EXAM Patient is awake, alert, oriented x3. Her speech is moderately dysarthric. Naming, repetition and comprehension are intact. She is able to follow multistep commands. She is able to provide a reliable history. Cranial nerves: Pupils are equal round reactive to light, R gaze preference, unable to force eyes to cross the midline, does not blink to threat on the L but does on the right. She can count fingers on the R but not on the left. She has left lower facial weakness at rest and upon trying to smile. Decreased facial sensation is diminished on the left. tongue midline, palate elevates symmetrically. Motor exam: Left upper extremity is 1/5, left lower extremity is 1/5. Right upper and lower extremities are normal strength at 5/5. Sensory exam: Decreased sensation on the left when compared to the right.  With her eyes closed, she did not recognized touch on her left side and also reported appreciating touch on the right side when I was actually touching both her legs. Does not recognize her L arm. L neglect. Coordination: Intact finger-nose-finger on the right,  Gait testing was deferred at this time.   ASSESSMENT/PLAN Ms. Emberlie Gotcher is a 84 y.o. female with history of HTN, hypothyroidism, CKD stage III, CAD presenting with L HP and L facial droop x 1 week  Stroke:  right MCA infarct with R MCA thrombus, infarcts felt to be embolic secondary to unknown source, multiple intracranial atherosclerosis vs. cardioembolic source    CT head No acute stroke. Small vessel disease. Atrophy.   MRI  R posterior insula, frontal operculum and  posterolateral frontal lobe infarct. R MCA thrombus. Small vessel disease. Atrophy.  CTA head &  neck RM2 branch occlusion. B VA origin severe stenosis. R ICA < 50% stenoisis. L ICA 50-70% stenosis. L subclavian severe > 70% stenosis  Repeat CT head 8/28 with neuro worsening showed extension of right MCA infarct  2D Echo  EF 60-65%. No source of embolus. Mod pulm HTN. LA enlargement  LE venous doppler negative for DVT  Given her severe neuro deficit, recommend 30 day cardiac event monitoring as outpt to rule out afib. If that is unrevealing and pt neuro condition improves, may consider TEE and loop at that time. Discussed with pt and son, they are in agreement.   LDL 76  HgbA1c 5.9  Lovenox 40 mg sq daily for VTE prophylaxis  aspirin 81 mg daily prior to admission, now on aspirin 325 mg daily. Pt intolerant to Plavix (nausea). Continue aspirin 325 mg daily on discharge.  Therapy recommendations:  SNF  Disposition:  pending  Follow-up Stroke Clinic at [facility] in 4 weeks. Office will call with appointment date and time. Orders in place.  Dysphagia  Pt reports trouble swallowing pills this am, worse than usual  Passed Stroke swallow screen on admission  Now on dys 1 with thin liquid  Hypertensive Urgency  BP 230s on arrival  Variable BP - as high as 234/91, down to 139/58 during rounds  Home meds: lotensin 20, catapress 0.2 hs, toprol 100 XL daily  Now on: lotensin 20, catapress 0.2 bid, toprol 25 bid . Permissive hypertension (OK if < 220/120) but gradually normalize in 5-7 days . Long-term BP goal 130-150 due to intracranial stenosis  Hyperlipidemia  Home meds:  Zocor 40, resumed in hospital then d/c'd - I have resumed  LDL 76, goal < 70  Continue statin at discharge  Other Stroke Risk Factors  Advanced age  Coronary artery disease  Other Active Problems  Elevated troponin 0.13, 0.15, 0.09   Hypothyroidism on synthroid. TSH pending    Anxiety on effexor  Leukocytosis WBC 12.7  Hospital day # 2  Neurology will sign off. Please call with questions. Pt will follow up with stroke clinic NP at [facility] in about 4 weeks. Thanks for the consult.   Rosalin Hawking, MD PhD Stroke Neurology 10/15/2017 1:46 PM    To contact Stroke Continuity provider, please refer to [email or web address] After hours, contact General Neurology

## 2017-10-15 NOTE — Discharge Summary (Signed)
Sonya [name], is a 84 y.o. female  DOB Jun 19, 1932  MRN [id number].  Admission date:  10/12/2017  Admitting Physician  Vianne Bulls, MD  Discharge Date:  10/15/2017   Primary MD  Baxter Hire, MD  Recommendations for primary care physician for things to follow:   1)Pured solids diet, thin liquids and crushed medications in applesauce 2) Take medications as prescribed including Aspirin 325 mg daily with Food 3) Avoid ibuprofen/Advil/Aleve/Motrin/Goody Powders/Naproxen/BC powders/Meloxicam/Diclofenac/Indomethacin and other Nonsteroidal anti-inflammatory medications as these will make you more likely to bleed and can cause stomach ulcers, can also cause Kidney problems.  4) a 30-day Holter monitor to look for atrial fibrillation and other arrhythmias, if no significant arrhythmia on a 30-day Holter monitor,  patient will probably need a TEE and loop recorder as outpatient----please refer to outpatient cardiology to have 30-day event/Holter monitor placed 5) follow-up with neurologist in a couple of weeks as previously advised   Admission Diagnosis  Stroke (cerebrum) ([place]) [I63.9] Elevated troponin [R74.8] Cerebrovascular accident (CVA), unspecified mechanism ([place]) [I63.9] Hypertension, unspecified type [I10]   Discharge Diagnosis  Stroke (cerebrum) ([place]) [I63.9] Elevated troponin [R74.8] Cerebrovascular accident (CVA), unspecified mechanism ([place]) [I63.9] Hypertension, unspecified type [I10]    Principal Problem:   Unknown when suspected stroke patient was last well Active Problems:   Hypertension   Hypertensive urgency   Hypothyroidism   Left-sided weakness   Elevated troponin   Cerebral embolism with cerebral infarction   CVA (cerebral vascular accident) [facility])      Past Medical History:  Diagnosis Date  . Hypertension     History reviewed. No pertinent surgical history.     HPI   from the history and physical done on the day of admission:    Patient coming from: Home   Chief Complaint: Left-sided weakness   HPI: Sonya [name] is a 84 y.o. female with medical history significant for hypothyroidism, hypertension, and anxiety, now presenting to the emergency department with left-sided weakness.  Patient reports that she has been generally weak and fatigued for the past week or so, but was not noted to have a left facial droop or focal weakness until earlier today.  Family had noted at approximately 1:30 PM that the patient had a left facial droop with dysarthria and appeared to be dragging her left leg.  Patient denies any chest pain, palpitations, or shortness of breath.  She had a fall out of her bed early this morning without any significant injury or loss of consciousness.  ED Course: Upon arrival to the ED, patient is found to be afebrile, saturating well on room air, hypertensive to 210/80, and vitals otherwise normal. EKG features a sinus rhythm and noncontrast head CT is negative for acute intracranial abnormality. Chemistry panel is unremarkable and CBC is notable for a leukocytosis to 12,700. Troponin is elevated to 0.10. Patient was given a dose of IV labetalol in the ED and neurology was consulted by the ED physician. Patient will be observed for ongoing evaluation and management of left-sided weakness  suspected secondary to stroke.   Hospital Course:     Repeat CT head 10/14/17 1. Evolving moderate sized acute to early subacute ischemic right MCA territory infarct, slightly increased in size relative to previous MRI. No significant edema or evidence for hemorrhagic transformation.  Brief  Summary: 84 year old female with history of essential hypertension, hypothyroidism, CKD stage III, CAD came to the hospital for evaluation of left-sided weakness as well as left-sided facial droop. Per family this is somewhat progressed over the course of last week  therefore she was not a candidate for TPA. Initial CT of the head was negative but MRI of the brain was positive which showed early subacute right posterior insula, fracture operculum and posterior lateral lobe infarct.     1)Acute Rt MCA infarct with Rt MCA Thrombus-  Acute right posterior insula, frontal operculum and posterior lateral lobe infarct)-- infarcts felt to beembolic secondary tounknownsourcein setting of multiple intracranialatherosclerosis, suspicious foratrial fibrillationwith Left-sided hemiplegia and left-sided facial droop-    CTA head and neck showed severe stenosis of bilateral vertebral artery, right proximal ICA less than 50%, left proximal ICA 50-70%, left subclavian greater than 70%. MRI of the brain was positive for CVA as above. Neurology team consult appreciated - Hemoglobin A1c 5.9, LDL 76, HDL 38, post discharge advise 30-day Holter monitor to look for atrial fibrillation and other arrhythmias, if no significant arrhythmia on a 30-day Holter monitor,  patient will probably need a TEE and loop recorder as outpatient----please refer to outpatient cardiology to have 30-day event/Holter monitor placed,  follow-up with neurologist in a couple of weeks as previously advised.  -Echocardiogram with preserved EF (60 to 65 %), ??? Need for TEE/Loop to look for embolic source however patient may not be a candidate for long-term full anticoagulation due to history of multiple falls (5 falls already this year), PTA patient was on aspirin 81 mg daily,, apparently patient has GI intolerance to Plavix, neurologist recommends aspirin 325 mg daily, continue simvastatin 40 mg daily, repeat CT head from 10/14/2017 without new acute findings, please see report above  2)Essential hypertension, uncontrolled- - target systolic BP should be 846  To 150 range,  continue benazepril 20 mg daily,, clonidine 0.2 mg twice daily, and metoprolol 25 mg twice daily,   3)Elevated troponin- - Likely  demand ischemia. No evidence of ischemia on the EKG, she remains chest pain-free. Troponins are trended down now.  Echocardiogram without significant regional wall motion normalities  4)Hypothyroidism- -Continue levothyroxine 25 mcg daily  5)Dysphagia/FEN--bedside swallow eval completed on 10/14/2017, speech pathologist recommends Pured solids diet, thin liquids and crushed medications in applesauce   Code Status:Full Family Communication:Son and Husband at Bedside Disposition Plan:  SNF Consultants:  Neurology  Code Status : Full code     Discharge Condition: stable  Follow UP   Contact information for follow-up providers    [facility] Follow up in 4 week(s).   Specialty:  Neurology Why:  stroke clinic. office will call with appt date and time. Contact information: [address] [place] [phone]           Contact information for after-discharge care    Destination    [facility] .   Service:  Skilled Nursing Contact information: [address] [place] [phone]                   Consults obtained - Neuro  Diet and Activity recommendation:  As advised  Discharge Instructions    Discharge Instructions    Ambulatory referral to Neurology   Complete by:  As directed    Follow up with stroke clinic NP (Jessica Vanschaick or Cecille Rubin, if both not available, consider Dr. Antony Contras, Dr. Bess Harvest, or Dr. Sarina Ill) at [facility] in about 4 weeks.   Call MD for:  difficulty breathing, headache or visual disturbances   Complete by:  As directed    Call MD for:  persistant dizziness or light-headedness   Complete by:  As directed    Call MD for:  persistant nausea and vomiting   Complete by:  As directed    Call MD for:  severe uncontrolled pain   Complete by:  As directed    Call MD  for:  temperature >100.4   Complete by:  As directed    Diet general   Complete by:  As directed    Pured solids, thin liquids, give medications and crushed medications in  applesauce crushed   Discharge instructions   Complete by:  As directed    1)Pured solids diet, thin liquids and crushed medications in applesauce 2) Take medications as prescribed including Aspirin 325 mg daily with Food 3) Avoid ibuprofen/Advil/Aleve/Motrin/Goody Powders/Naproxen/BC powders/Meloxicam/Diclofenac/Indomethacin and other Nonsteroidal anti-inflammatory medications as these will make you more likely to bleed and can cause stomach ulcers, can also cause Kidney problems.  4) a 30-day Holter monitor to look for atrial fibrillation in order to arrhythmias, if no significant arrhythmia on a 30-day Holter monitor,  patient will probably need a TEE and loop recorder as outpatient----please refer to outpatient cardiology to have 30-day event/Holter monitor placed 5) follow-up with neurologist in a couple of weeks as previously advised   Walk with assistance   Complete by:  As directed    Fall precautions        Discharge Medications     Allergies as of 10/15/2017      Reactions   Clopidogrel Nausea And Vomiting    reported by [place] 11/24/13   Omeprazole Other (See Comments)   Unknown reaction - reported by [place] 11/24/13      Medication List    STOP taking these medications   aspirin EC 81 MG tablet Replaced by:  aspirin 325 MG tablet   AZO STANDARD MAXIMUM STRENGTH PO   CoQ10 100 MG Caps   ibuprofen 200 MG tablet Commonly known as:  ADVIL,MOTRIN   metoprolol succinate 100 MG 24 hr tablet Commonly known as:  TOPROL-XL     TAKE these medications   acetaminophen 325 MG tablet Commonly known as:  TYLENOL Take 2 tablets (650 mg total) by mouth every 4 (four) hours as needed for mild pain (or temp > 37.5 C (99.5 F)).   aspirin 325 MG tablet Take 1  tablet (325 mg total) by mouth daily. With Food Start taking on:  10/16/2017 Replaces:  aspirin EC 81 MG tablet   b complex vitamins tablet Take 1 tablet by mouth daily.   benazepril 20 MG tablet Commonly known as:  LOTENSIN Take 20 mg by mouth at bedtime.   cloNIDine 0.2 MG tablet Commonly known as:  CATAPRES Take 1 tablet (0.2 mg total) by mouth 2 (two) times daily. What changed:    medication strength  when to take this   lansoprazole 15 MG capsule Commonly known as:  PREVACID Take 15 mg by mouth at bedtime.   levothyroxine 25  MCG tablet Commonly known as:  SYNTHROID, LEVOTHROID Take 25 mcg by mouth daily before breakfast.   metoprolol tartrate 25 MG tablet Commonly known as:  LOPRESSOR Take 1 tablet (25 mg total) by mouth 2 (two) times daily.   neomycin-bacitracin-polymyxin [phone] ointment Apply 1 application topically daily. Apply to wounds on legs   senna-docusate 8.6-50 MG tablet Commonly known as:  Senokot-S Take 2 tablets by mouth at bedtime.   simvastatin 40 MG tablet Commonly known as:  ZOCOR Take 1 tablet (40 mg total) by mouth daily at 6 PM. What changed:  when to take this   venlafaxine XR 150 MG 24 hr capsule Commonly known as:  EFFEXOR-XR Take 150 mg by mouth at bedtime.   Vitamin D3 1000 units Caps Take 3,000 Units by mouth at bedtime.       Major procedures and Radiology Reports - PLEASE review detailed and final reports for all details, in brief -    Ct Angio Head W Or Wo Contrast  Result Date: 10/13/2017 CLINICAL DATA:  84 y/o F; left-sided facial droop and slurred speech. EXAM: CT ANGIOGRAPHY HEAD AND NECK TECHNIQUE: Multidetector CT imaging of the head and neck was performed using the standard protocol during bolus administration of intravenous contrast. Multiplanar CT image reconstructions and MIPs were obtained to evaluate the vascular anatomy. Carotid stenosis measurements (when applicable) are obtained utilizing NASCET criteria,  using the distal internal carotid diameter as the denominator. CONTRAST:  47mL ISOVUE-370 IOPAMIDOL (ISOVUE-370) INJECTION 76% COMPARISON:  10/12/2017 CT head.  10/13/2017 MRI head. FINDINGS: CTA NECK FINDINGS Aortic arch: Bovine variant branching. Imaged portion shows no evidence of aneurysm or dissection. Left subclavian artery extensive calcified plaque with moderate to severe 70% proximal stenosis. Right carotid system: No evidence of dissection, stenosis (50% or greater) or occlusion. Extensive calcified plaque of the carotid bifurcation with mild less than 50% proximal ICA stenosis. Left carotid system: No evidence of dissection or occlusion. Severe calcified plaque of the left carotid bifurcation with moderate 50-70% proximal ICA stenosis. Vertebral arteries: Calcified plaque of the bilateral vertebral arteries with severe stenosis of the vertebral artery origins bilaterally. Mild calcific atherosclerosis of the left V1 segment. Skeleton: Moderate cervical spondylosis with disc and facet degenerative changes greatest at the C4-C7 levels. No high-grade bony canal stenosis. Other neck: Negative. Upper chest: Negative. Review of the MIP images confirms the above findings CTA HEAD FINDINGS Anterior circulation: Right MCA distribution mid to distal M2 branch occlusion within the mid sylvian fissure (series 12, image 10). No additional large vessel occlusion, aneurysm, or high-grade stenosis. Calcified plaque of the carotid siphons with mild less than 50% paraclinoid stenosis. Posterior circulation: No significant stenosis, proximal occlusion, aneurysm, or vascular malformation. Venous sinuses: As permitted by contrast timing, patent. Anatomic variants: Complete circle-of-Willis. Delayed phase: No abnormal intracranial enhancement. Review of the MIP images confirms the above findings IMPRESSION: 1. Right mid to distal M2 branch occlusion within mid sylvian fissure. 2. No additional intracranial large vessel  occlusion, aneurysm, or significant stenosis. 3. Bilateral vertebral artery origin severe stenosis with calcified plaque. 4. Right proximal ICA mild less than 50% stenosis with calcified plaque. 5. Left proximal ICA moderate 50-70% stenosis with calcified plaque. 6. Left subclavian artery severe greater than 70% proximal stenosis with calcified plaque. These results will be called to the ordering clinician or representative by the Radiologist Assistant, and communication documented in the PACS or zVision Dashboard. Electronically Signed   By: Kristine Garbe M.D.   On: 10/13/2017 03:41  Dg Chest 2 View  Result Date: 10/12/2017 CLINICAL DATA:  84 y/o F; patient presents with left-sided weakness tonight. Back pain from multiple intra discs. EXAM: CHEST - 2 VIEW COMPARISON:  None. FINDINGS: Mild cardiomegaly given projection and technique. Calcific aortic atherosclerosis. No consolidation, effusion, or pneumothorax. Lower thoracic, probably T12 and L1 mild anterior compression deformities, age indeterminate. IMPRESSION: Lower thoracic, probably T12-L1 mild anterior compression deformities, age indeterminate. No acute pulmonary process identified. Electronically Signed   By: Kristine Garbe M.D.   On: 10/12/2017 23:17   Ct Head Wo Contrast  Result Date: 10/14/2017 CLINICAL DATA:  Follow-up examination for stroke. EXAM: CT HEAD WITHOUT CONTRAST TECHNIQUE: Contiguous axial images were obtained from the base of the skull through the vertex without intravenous contrast. COMPARISON:  Prior studies from 10/13/2017. FINDINGS: Brain: Evolving cytotoxic edema involving the mid and posterior right MCA distribution, consistent with acute to early subacute right MCA territory infarct. Overall, size appears somewhat increased relative to previous MRI. No associated hemorrhage or significant mass effect. No other acute large vessel territory infarct. No mass lesion, midline shift or mass effect. No  hydrocephalus. No extra-axial fluid collection. Underlying atrophy with chronic small vessel ischemic disease again noted. Vascular: Punctate hyperdensity within the distal right sylvian fissure, likely intravascular thrombus. Scattered vascular calcifications noted within the carotid siphons. Skull: Unremarkable. Sinuses/Orbits: Globes and orbital soft tissues demonstrate no acute finding. Paranasal sinuses and mastoid air cells are clear. Other: None. IMPRESSION: 1. Evolving moderate sized acute to early subacute ischemic right MCA territory infarct, slightly increased in size relative to previous MRI. No significant edema or evidence for hemorrhagic transformation. 2. Otherwise stable appearance of the brain. No other acute intracranial abnormality identified. Electronically Signed   By: Jeannine Boga M.D.   On: 10/14/2017 14:05   Ct Head Wo Contrast  Result Date: 10/12/2017 CLINICAL DATA:  Hit head.  Fall. EXAM: CT HEAD WITHOUT CONTRAST TECHNIQUE: Contiguous axial images were obtained from the base of the skull through the vertex without intravenous contrast. COMPARISON:  None. FINDINGS: Brain: There is atrophy and chronic small vessel disease changes. No acute intracranial abnormality. Specifically, no hemorrhage, hydrocephalus, mass lesion, acute infarction, or significant intracranial injury. Vascular: No hyperdense vessel or unexpected calcification. Skull: No acute calvarial abnormality. Sinuses/Orbits: Visualized paranasal sinuses and mastoids clear. Orbital soft tissues unremarkable. Other: None IMPRESSION: No acute intracranial abnormality. Atrophy, chronic microvascular disease. Electronically Signed   By: Rolm Baptise M.D.   On: 10/12/2017 20:53   Ct Angio Neck W Or Wo Contrast  Result Date: 10/13/2017 CLINICAL DATA:  84 y/o F; left-sided facial droop and slurred speech. EXAM: CT ANGIOGRAPHY HEAD AND NECK TECHNIQUE: Multidetector CT imaging of the head and neck was performed using the  standard protocol during bolus administration of intravenous contrast. Multiplanar CT image reconstructions and MIPs were obtained to evaluate the vascular anatomy. Carotid stenosis measurements (when applicable) are obtained utilizing NASCET criteria, using the distal internal carotid diameter as the denominator. CONTRAST:  22mL ISOVUE-370 IOPAMIDOL (ISOVUE-370) INJECTION 76% COMPARISON:  10/12/2017 CT head.  10/13/2017 MRI head. FINDINGS: CTA NECK FINDINGS Aortic arch: Bovine variant branching. Imaged portion shows no evidence of aneurysm or dissection. Left subclavian artery extensive calcified plaque with moderate to severe 70% proximal stenosis. Right carotid system: No evidence of dissection, stenosis (50% or greater) or occlusion. Extensive calcified plaque of the carotid bifurcation with mild less than 50% proximal ICA stenosis. Left carotid system: No evidence of dissection or occlusion. Severe calcified plaque of the  left carotid bifurcation with moderate 50-70% proximal ICA stenosis. Vertebral arteries: Calcified plaque of the bilateral vertebral arteries with severe stenosis of the vertebral artery origins bilaterally. Mild calcific atherosclerosis of the left V1 segment. Skeleton: Moderate cervical spondylosis with disc and facet degenerative changes greatest at the C4-C7 levels. No high-grade bony canal stenosis. Other neck: Negative. Upper chest: Negative. Review of the MIP images confirms the above findings CTA HEAD FINDINGS Anterior circulation: Right MCA distribution mid to distal M2 branch occlusion within the mid sylvian fissure (series 12, image 10). No additional large vessel occlusion, aneurysm, or high-grade stenosis. Calcified plaque of the carotid siphons with mild less than 50% paraclinoid stenosis. Posterior circulation: No significant stenosis, proximal occlusion, aneurysm, or vascular malformation. Venous sinuses: As permitted by contrast timing, patent. Anatomic variants: Complete  circle-of-Willis. Delayed phase: No abnormal intracranial enhancement. Review of the MIP images confirms the above findings IMPRESSION: 1. Right mid to distal M2 branch occlusion within mid sylvian fissure. 2. No additional intracranial large vessel occlusion, aneurysm, or significant stenosis. 3. Bilateral vertebral artery origin severe stenosis with calcified plaque. 4. Right proximal ICA mild less than 50% stenosis with calcified plaque. 5. Left proximal ICA moderate 50-70% stenosis with calcified plaque. 6. Left subclavian artery severe greater than 70% proximal stenosis with calcified plaque. These results will be called to the ordering clinician or representative by the Radiologist Assistant, and communication documented in the PACS or zVision Dashboard. Electronically Signed   By: Kristine Garbe M.D.   On: 10/13/2017 03:41   Mr Brain Wo Contrast  Result Date: 10/13/2017 CLINICAL DATA:  84 y/o  F; left-sided weakness. EXAM: MRI HEAD WITHOUT CONTRAST TECHNIQUE: Multiplanar, multiecho pulse sequences of the brain and surrounding structures were obtained without intravenous contrast. COMPARISON:  10/12/2017 CT head. FINDINGS: Brain: Reduced diffusion within the right posterior insula, frontal operculum, and posterolateral frontal lobe compatible with acute/early subacute infarction. No associated hemorrhage or mass effect. Several nonspecific T2 FLAIR hyperintensities in subcortical and periventricular white matter are compatible with mild chronic microvascular ischemic changes for age. Mild volume loss of the brain. Vascular: Linear focus of susceptibility blooming measuring 7 mm in the right sylvian fissure, likely a vessel thrombus (series 10, image 29). Skull and upper cervical spine: Normal marrow signal. Sinuses/Orbits: Negative.  Bilateral intra-ocular lens replacement. Other: None. IMPRESSION: 1. Acute/early subacute infarction involving the right posterior insula, frontal operculum, and  posterolateral frontal lobe. No hemorrhage or mass effect. 2. 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 3. Mild for age chronic microvascular ischemic changes and volume loss of the brain. These results will be called to the ordering clinician or representative by the Radiologist Assistant, and communication documented in the PACS or zVision Dashboard. Electronically Signed   By: Kristine Garbe M.D.   On: 10/13/2017 01:42    Micro Results   No results found for this or any previous visit (from the past 240 hour(s)).  Today   Subjective    Ravin Bendall today has no new complaints,  No fever  Or chills,  No Nausea, Vomiting or Diarrhea   Patient has been seen and examined prior to discharge   Objective   Blood pressure (!) 164/63, pulse 63, temperature 97.9 F (36.6 C), resp. rate 18, height 5' (1.524 m), weight 56.1 kg, SpO2 94 %.   Intake/Output Summary (Last 24 hours) at 10/15/2017 1503 Last data filed at 10/15/2017 0554 Gross per 24 hour  Intake -  Output 300 ml  Net -300 ml    Exam Gen:- Awake Alert,  In no apparent distress  HEENT:- [place].AT, No sclera icterus Neck-Supple Neck,No JVD,.  Lungs-  CTAB , fair air movement CV- S1, S2 normal Abd-  +ve B.Sounds, Abd Soft, No tenderness,    Extremity/Skin:- No  edema,   good pulses Psych-affect is appropriate, oriented x3 Neuro-   Dense left-sided hemiplegia, facial droop, slight horizontal movement of the left lower extremity (no movement against gravity), no movement of left upper extremity   Data Review   CBC w Diff:  Lab Results  Component Value Date   WBC 12.7 (H) 10/12/2017   HGB 13.9 10/12/2017   HCT 45.5 10/12/2017   PLT 221 10/12/2017   LYMPHOPCT 13 10/12/2017   MONOPCT 7 10/12/2017   EOSPCT 1 10/12/2017   BASOPCT 1 10/12/2017    CMP:  Lab Results  Component Value Date   NA 141 10/12/2017   K 4.3 10/12/2017   CL 106  10/12/2017   CO2 26 10/12/2017   BUN 19 10/12/2017   CREATININE 1.00 10/12/2017   PROT 6.7 10/12/2017   ALBUMIN 3.5 10/12/2017   BILITOT 1.1 10/12/2017   ALKPHOS 74 10/12/2017   AST 27 10/12/2017   ALT 17 10/12/2017  . Total Discharge time is about 33 minutes  Roxan Hockey M.D on 10/15/2017 at 3:03 PM  Pager---[phone]  Go to [email or web address] - password [email or web address] for contact info  [facility] - Office  [phone]

## 2017-10-15 NOTE — Progress Notes (Signed)
Patient has been authorized by insurance to go to SNF today. MD aware. CSW spoke with patient's son to let him know that patient does not qualify for CIR due to not being able to tolerate three hours of therapy. Patient's son has decided to accept bed at [facility] at [place]. They are able to accept patient today.   Percell Locus Rishan Oyama LCSW [phone]

## 2017-10-15 NOTE — Progress Notes (Signed)
Patient will DC to: [facility] [place], [place] Anticipated DC date: 10/15/17 Family notified: Son, [name] Transport by: Corey Harold    Per MD patient ready for DC to [facility]. RN, patient, patient's family, and facility notified of DC. Discharge Summary sent to facility. RN given number for report [phone]). DC packet on chart. Ambulance transport requested for patient.   CSW signing off.  Cedric Fishman, LCSW Clinical Social Worker [phone]

## 2017-10-15 NOTE — Clinical Social Work Note (Signed)
Per charge RN, patient is stable for discharge today. CSW faxed clinicals to [facility] for authorization. Will provide bed offers to family for decision.  Dayton Scrape, [place]

## 2017-10-15 NOTE — Progress Notes (Addendum)
LE venous duplex prelim: negative for DVT in visualized veins.  Marcelles Clinard Eunice, RDMS, RVT  

## 2017-10-15 NOTE — Discharge Instructions (Signed)
1)Pured solids diet, thin liquids and crushed medications in applesauce 2) Take medications as prescribed including Aspirin 325 mg daily with Food 3) Avoid ibuprofen/Advil/Aleve/Motrin/Goody Powders/Naproxen/BC powders/Meloxicam/Diclofenac/Indomethacin and other Nonsteroidal anti-inflammatory medications as these will make you more likely to bleed and can cause stomach ulcers, can also cause Kidney problems.  4) a 30-day Holter monitor to look for atrial fibrillation and other arrhythmias, if no significant arrhythmia on a 30-day Holter monitor,  patient will probably need a TEE and loop recorder as outpatient----please refer to outpatient cardiology to have 30-day event/Holter monitor placed 5) follow-up with neurologist in a couple of weeks as previously advised

## 2017-10-15 NOTE — Progress Notes (Signed)
Physical Therapy Treatment Patient Details Name: Sonya [name] MRN: [id number] DOB: February 18, 1932 Today's Date: 10/15/2017    History of Present Illness 84 year old female with history of essential hypertension, hypothyroidism, CKD stage III, CAD came to the hospital for evaluation of left-sided weakness as well as left-sided facial droop.  Per family this is somewhat progressed over the course of last week therefore she was not a candidate for TPA.  Initial CT of the head was negative but MRI of the brain was positive which showed early subacute right posterior insula, fracture operculum and posterior lateral lobe infarct.     PT Comments    Patient progressing slowly but participating well despite headache this session.  Family in room and supportive.  L LE showing more activation, but remains weak and incoordinated.  Remains good candidate for SNF level rehab upon d/c.   Follow Up Recommendations  SNF;Supervision/Assistance - 24 hour     Equipment Recommendations  Other (comment)(TBA)    Recommendations for Other Services       Precautions / Restrictions Precautions Precautions: Fall Restrictions Weight Bearing Restrictions: No    Mobility  Bed Mobility Overal bed mobility: Needs Assistance Bed Mobility: Rolling;Sidelying to Sit Rolling: Min assist Sidelying to sit: Mod assist;HOB elevated       General bed mobility comments: cues for technique, guided R hand to reach to rail on L, assist for lifting trunk and cues for feet off bed  Transfers Overall transfer level: Needs assistance Equipment used: None Transfers: Sit to/from Stand Sit to Stand: Max assist;+2 physical assistance Stand pivot transfers: Max assist;+2 safety/equipment       General transfer comment: lifting assist from EOB, cues for head righting, assist for balance and R lateral weight shift, assist to step to recliner with assist for L LE placement and for L trunk and LE stability when stepping with  R  Ambulation/Gait                 Stairs             Wheelchair Mobility    Modified Rankin (Stroke Patients Only) Modified Rankin (Stroke Patients Only) Pre-Morbid Rankin Score: No symptoms Modified Rankin: Severe disability     Balance Overall balance assessment: Needs assistance Sitting-balance support: Feet supported Sitting balance-Leahy Scale: Poor Sitting balance - Comments: mod A for balance at EOB, intermittent min A with cues for head righting and gaze out window, but does not last more than few seconds   Standing balance support: Single extremity supported Standing balance-Leahy Scale: Zero Standing balance comment: stood about 10 -15 seconds with cues for forward gaze, balance and lateral weight shift for L LE awareness and weight acceptance                            Cognition Arousal/Alertness: Awake/alert Behavior During Therapy: Flat affect Overall Cognitive Status: Impaired/Different from baseline Area of Impairment: Attention;Memory;Following commands;Safety/judgement;Awareness;Problem solving                   Current Attention Level: Sustained Memory: Decreased short-term memory Following Commands: Follows one step commands consistently Safety/Judgement: Decreased awareness of safety;Decreased awareness of deficits Awareness: Intellectual Problem Solving: Slow processing;Decreased initiation;Requires verbal cues;Requires tactile cues        Exercises General Exercises - Lower Extremity Ankle Circles/Pumps: AROM;5 reps;Left;Supine Heel Slides: AAROM;AROM;5 reps;Both;Supine    General Comments General comments (skin integrity, edema, etc.): family in room throughout  Pertinent Vitals/Pain Faces Pain Scale: Hurts little more Pain Location: headache Pain Descriptors / Indicators: Aching Pain Intervention(s): Repositioned;Monitored during session;Patient requesting pain meds-RN notified    Home Living                       Prior Function            PT Goals (current goals can now be found in the care plan section) Progress towards PT goals: Progressing toward goals    Frequency    Min 3X/week      PT Plan Current plan remains appropriate    Co-evaluation              AM-PAC PT "6 Clicks" Daily Activity  Outcome Measure  Difficulty turning over in bed (including adjusting bedclothes, sheets and blankets)?: Unable Difficulty moving from lying on back to sitting on the side of the bed? : Unable Difficulty sitting down on and standing up from a chair with arms (e.g., wheelchair, bedside commode, etc,.)?: Unable Help needed moving to and from a bed to chair (including a wheelchair)?: Total Help needed walking in hospital room?: Total Help needed climbing 3-5 steps with a railing? : Total 6 Click Score: 6    End of Session Equipment Utilized During Treatment: Gait belt Activity Tolerance: Patient limited by fatigue Patient left: in chair;with call bell/phone within reach;with chair alarm set;with family/visitor present Nurse Communication: Mobility status PT Visit Diagnosis: Other abnormalities of gait and mobility (R26.89);Hemiplegia and hemiparesis;Other symptoms and signs involving the nervous system (R29.898) Hemiplegia - Right/Left: Left Hemiplegia - dominant/non-dominant: Non-dominant Hemiplegia - caused by: Cerebral infarction     Time: 9735-3299 PT Time Calculation (min) (ACUTE ONLY): 25 min  Charges:  $Therapeutic Activity: 8-22 mins $Neuromuscular Re-education: 8-22 mins                     [place], [place] [phone] 10/15/2017    Reginia Naas 10/15/2017, 12:19 PM

## 2017-10-30 ENCOUNTER — Inpatient Hospital Stay
Admission: EM | Admit: 2017-10-30 | Discharge: 2017-11-03 | DRG: 392 | Disposition: A | Discharge status: Skilled Nursing Facility | Payer: Medicare PPO | Source: Skilled Nursing Facility | Attending: Family Medicine | Admitting: Family Medicine

## 2017-10-30 ENCOUNTER — Other Ambulatory Visit: Payer: Self-pay

## 2017-10-30 ENCOUNTER — Encounter: Payer: Self-pay | Admitting: *Deleted

## 2017-10-30 ENCOUNTER — Emergency Department: Payer: Medicare PPO

## 2017-10-30 DIAGNOSIS — Z7982 Long term (current) use of aspirin: Secondary | ICD-10-CM

## 2017-10-30 DIAGNOSIS — I69391 Dysphagia following cerebral infarction: Secondary | ICD-10-CM

## 2017-10-30 DIAGNOSIS — K219 Gastro-esophageal reflux disease without esophagitis: Secondary | ICD-10-CM | POA: Diagnosis present

## 2017-10-30 DIAGNOSIS — K5900 Constipation, unspecified: Principal | ICD-10-CM | POA: Diagnosis present

## 2017-10-30 DIAGNOSIS — Z853 Personal history of malignant neoplasm of breast: Secondary | ICD-10-CM

## 2017-10-30 DIAGNOSIS — R1110 Vomiting, unspecified: Secondary | ICD-10-CM

## 2017-10-30 DIAGNOSIS — Z66 Do not resuscitate: Secondary | ICD-10-CM | POA: Diagnosis present

## 2017-10-30 DIAGNOSIS — Z87891 Personal history of nicotine dependence: Secondary | ICD-10-CM

## 2017-10-30 DIAGNOSIS — R109 Unspecified abdominal pain: Secondary | ICD-10-CM | POA: Diagnosis present

## 2017-10-30 DIAGNOSIS — K5909 Other constipation: Secondary | ICD-10-CM

## 2017-10-30 DIAGNOSIS — M549 Dorsalgia, unspecified: Secondary | ICD-10-CM | POA: Diagnosis present

## 2017-10-30 DIAGNOSIS — I6529 Occlusion and stenosis of unspecified carotid artery: Secondary | ICD-10-CM | POA: Diagnosis present

## 2017-10-30 DIAGNOSIS — I771 Stricture of artery: Secondary | ICD-10-CM | POA: Diagnosis present

## 2017-10-30 DIAGNOSIS — I10 Essential (primary) hypertension: Secondary | ICD-10-CM | POA: Diagnosis present

## 2017-10-30 DIAGNOSIS — E785 Hyperlipidemia, unspecified: Secondary | ICD-10-CM | POA: Diagnosis present

## 2017-10-30 DIAGNOSIS — R1311 Dysphagia, oral phase: Secondary | ICD-10-CM | POA: Diagnosis present

## 2017-10-30 DIAGNOSIS — R4702 Dysphasia: Secondary | ICD-10-CM | POA: Diagnosis not present

## 2017-10-30 DIAGNOSIS — Z8041 Family history of malignant neoplasm of ovary: Secondary | ICD-10-CM

## 2017-10-30 DIAGNOSIS — G894 Chronic pain syndrome: Secondary | ICD-10-CM | POA: Diagnosis present

## 2017-10-30 DIAGNOSIS — I69392 Facial weakness following cerebral infarction: Secondary | ICD-10-CM | POA: Diagnosis not present

## 2017-10-30 DIAGNOSIS — I2510 Atherosclerotic heart disease of native coronary artery without angina pectoris: Secondary | ICD-10-CM | POA: Diagnosis present

## 2017-10-30 DIAGNOSIS — R1084 Generalized abdominal pain: Secondary | ICD-10-CM

## 2017-10-30 DIAGNOSIS — I69354 Hemiplegia and hemiparesis following cerebral infarction affecting left non-dominant side: Secondary | ICD-10-CM | POA: Diagnosis not present

## 2017-10-30 DIAGNOSIS — K55019 Acute (reversible) ischemia of small intestine, extent unspecified: Secondary | ICD-10-CM | POA: Diagnosis not present

## 2017-10-30 DIAGNOSIS — Z79890 Hormone replacement therapy (postmenopausal): Secondary | ICD-10-CM | POA: Diagnosis not present

## 2017-10-30 DIAGNOSIS — K8051 Calculus of bile duct without cholangitis or cholecystitis with obstruction: Secondary | ICD-10-CM | POA: Diagnosis present

## 2017-10-30 DIAGNOSIS — K8050 Calculus of bile duct without cholangitis or cholecystitis without obstruction: Secondary | ICD-10-CM | POA: Diagnosis present

## 2017-10-30 DIAGNOSIS — I739 Peripheral vascular disease, unspecified: Secondary | ICD-10-CM | POA: Diagnosis present

## 2017-10-30 DIAGNOSIS — F329 Major depressive disorder, single episode, unspecified: Secondary | ICD-10-CM | POA: Diagnosis present

## 2017-10-30 DIAGNOSIS — D72829 Elevated white blood cell count, unspecified: Secondary | ICD-10-CM | POA: Diagnosis present

## 2017-10-30 DIAGNOSIS — Z8249 Family history of ischemic heart disease and other diseases of the circulatory system: Secondary | ICD-10-CM

## 2017-10-30 DIAGNOSIS — Z955 Presence of coronary angioplasty implant and graft: Secondary | ICD-10-CM

## 2017-10-30 DIAGNOSIS — I708 Atherosclerosis of other arteries: Secondary | ICD-10-CM | POA: Diagnosis present

## 2017-10-30 DIAGNOSIS — E039 Hypothyroidism, unspecified: Secondary | ICD-10-CM | POA: Diagnosis present

## 2017-10-30 HISTORY — DX: Gastro-esophageal reflux disease without esophagitis: K21.9

## 2017-10-30 HISTORY — DX: Atherosclerotic heart disease of native coronary artery without angina pectoris: I25.10

## 2017-10-30 HISTORY — DX: Personal history of other infectious and parasitic diseases: Z86.19

## 2017-10-30 HISTORY — DX: Cerebral infarction, unspecified: I63.9

## 2017-10-30 HISTORY — DX: Hyperlipidemia, unspecified: E78.5

## 2017-10-30 HISTORY — DX: Carpal tunnel syndrome, unspecified upper limb: G56.00

## 2017-10-30 HISTORY — DX: Malignant neoplasm of unspecified site of unspecified female breast: C50.919

## 2017-10-30 LAB — URINALYSIS, COMPLETE (UACMP) WITH MICROSCOPIC
BACTERIA UA: NONE SEEN
BILIRUBIN URINE: NEGATIVE
GLUCOSE, UA: NEGATIVE mg/dL
HGB URINE DIPSTICK: NEGATIVE
KETONES UR: NEGATIVE mg/dL
LEUKOCYTES UA: NEGATIVE
Nitrite: NEGATIVE
PROTEIN: NEGATIVE mg/dL
Specific Gravity, Urine: 1.018 (ref 1.005–1.030)
pH: 5 (ref 5.0–8.0)

## 2017-10-30 LAB — COMPREHENSIVE METABOLIC PANEL
ALT: 15 U/L (ref 0–44)
AST: 27 U/L (ref 15–41)
Albumin: 3.2 g/dL — ABNORMAL LOW (ref 3.5–5.0)
Alkaline Phosphatase: 80 U/L (ref 38–126)
Anion gap: 11 (ref 5–15)
BUN: 45 mg/dL — AB (ref 8–23)
CHLORIDE: 102 mmol/L (ref 98–111)
CO2: 26 mmol/L (ref 22–32)
Calcium: 9.7 mg/dL (ref 8.9–10.3)
Creatinine, Ser: 1.12 mg/dL — ABNORMAL HIGH (ref 0.44–1.00)
GFR calc Af Amer: 51 mL/min — ABNORMAL LOW (ref >60)
GFR, EST NON AFRICAN AMERICAN: 44 mL/min — AB (ref >60)
Glucose, Bld: 112 mg/dL — ABNORMAL HIGH (ref 70–99)
POTASSIUM: 5 mmol/L (ref 3.5–5.1)
Sodium: 139 mmol/L (ref 135–145)
Total Bilirubin: 0.8 mg/dL (ref 0.3–1.2)
Total Protein: 7.2 g/dL (ref 6.5–8.1)

## 2017-10-30 LAB — CBC WITH DIFFERENTIAL/PLATELET
BASOS PCT: 1 %
Basophils Absolute: 0.1 10*3/uL (ref 0–0.1)
Eosinophils Absolute: 0.2 10*3/uL (ref 0–0.7)
Eosinophils Relative: 2 %
HEMATOCRIT: 44 % (ref 35.0–47.0)
Hemoglobin: 14.4 g/dL (ref 12.0–16.0)
Lymphocytes Relative: 14 %
Lymphs Abs: 2 10*3/uL (ref 1.0–3.6)
MCH: 28 pg (ref 26.0–34.0)
MCHC: 32.6 g/dL (ref 32.0–36.0)
MCV: 85.9 fL (ref 80.0–100.0)
Monocytes Absolute: 1 10*3/uL — ABNORMAL HIGH (ref 0.2–0.9)
Monocytes Relative: 7 %
NEUTROS ABS: 11.2 10*3/uL — AB (ref 1.4–6.5)
NEUTROS PCT: 76 %
Platelets: 254 10*3/uL (ref 150–440)
RBC: 5.12 MIL/uL (ref 3.80–5.20)
RDW: 13.7 % (ref 11.5–14.5)
WBC: 14.5 10*3/uL — ABNORMAL HIGH (ref 3.6–11.0)

## 2017-10-30 LAB — LIPASE, BLOOD: LIPASE: 53 U/L — AB (ref 11–51)

## 2017-10-30 LAB — PROTIME-INR
INR: 1.06
Prothrombin Time: 13.7 seconds (ref 11.4–15.2)

## 2017-10-30 LAB — TROPONIN I: Troponin I: <0.03 ng/mL (ref <0.03)

## 2017-10-30 LAB — MRSA PCR SCREENING: MRSA BY PCR: POSITIVE — AB

## 2017-10-30 MED ORDER — ACETAMINOPHEN 325 MG PO TABS: 650.0000 mg | ORAL_TABLET | ORAL | Status: DC | PRN

## 2017-10-30 MED ORDER — ONDANSETRON HCL 4 MG/2ML IJ SOLN
4.0000 mg | Freq: Once | INTRAMUSCULAR | Status: AC
  Administered 2017-10-30: 4 mg via INTRAVENOUS
  Filled 2017-10-30: qty 2

## 2017-10-30 MED ORDER — SODIUM CHLORIDE 0.9 % IV SOLN
INTRAVENOUS | Status: DC
  Administered 2017-10-30 – 2017-11-01 (×3): via INTRAVENOUS

## 2017-10-30 MED ORDER — CHLORHEXIDINE GLUCONATE CLOTH 2 % EX PADS
6.0000 | MEDICATED_PAD | Freq: Every day | CUTANEOUS | Status: DC
  Administered 2017-10-31 – 2017-11-03 (×4): 6 via TOPICAL

## 2017-10-30 MED ORDER — ONDANSETRON HCL 4 MG/2ML IJ SOLN: 4.0000 mg | Freq: Four times a day (QID) | INTRAMUSCULAR | Status: DC | PRN

## 2017-10-30 MED ORDER — POLYETHYLENE GLYCOL 3350 17 G PO PACK: 17.0000 g | PACK | Freq: Two times a day (BID) | ORAL | Status: DC

## 2017-10-30 MED ORDER — LEVOTHYROXINE SODIUM 50 MCG PO TABS
25.0000 ug | ORAL_TABLET | Freq: Every day | ORAL | Status: DC
  Administered 2017-10-31 – 2017-11-03 (×4): 25 ug via ORAL
  Filled 2017-10-30 (×4): qty 1

## 2017-10-30 MED ORDER — ACETAMINOPHEN 325 MG PO TABS: 650.0000 mg | ORAL_TABLET | Freq: Four times a day (QID) | ORAL | Status: DC | PRN

## 2017-10-30 MED ORDER — ONDANSETRON HCL 4 MG PO TABS: 4.0000 mg | ORAL_TABLET | Freq: Four times a day (QID) | ORAL | Status: DC | PRN

## 2017-10-30 MED ORDER — OXYCODONE HCL 5 MG PO TABS: 5.0000 mg | ORAL_TABLET | ORAL | Status: DC | PRN

## 2017-10-30 MED ORDER — VENLAFAXINE HCL ER 75 MG PO CP24
150.0000 mg | ORAL_CAPSULE | Freq: Every day | ORAL | Status: DC
  Administered 2017-10-30 – 2017-11-02 (×4): 150 mg via ORAL
  Filled 2017-10-30 (×4): qty 2

## 2017-10-30 MED ORDER — MUPIROCIN 2 % EX OINT
1.0000 "application " | TOPICAL_OINTMENT | Freq: Two times a day (BID) | CUTANEOUS | Status: DC
  Administered 2017-10-30 – 2017-11-03 (×8): 1 via NASAL
  Filled 2017-10-30: qty 22

## 2017-10-30 MED ORDER — SIMVASTATIN 40 MG PO TABS
40.0000 mg | ORAL_TABLET | Freq: Every day | ORAL | Status: DC
  Administered 2017-10-30 – 2017-11-02 (×4): 40 mg via ORAL
  Filled 2017-10-30 (×5): qty 1

## 2017-10-30 MED ORDER — METOPROLOL TARTRATE 25 MG PO TABS
25.0000 mg | ORAL_TABLET | Freq: Two times a day (BID) | ORAL | Status: DC
  Administered 2017-10-30 – 2017-11-03 (×8): 25 mg via ORAL
  Filled 2017-10-30 (×9): qty 1

## 2017-10-30 MED ORDER — BENAZEPRIL HCL 20 MG PO TABS
20.0000 mg | ORAL_TABLET | Freq: Every day | ORAL | Status: DC
  Administered 2017-10-30 – 2017-11-02 (×4): 20 mg via ORAL
  Filled 2017-10-30 (×5): qty 1

## 2017-10-30 MED ORDER — CLONIDINE HCL 0.1 MG PO TABS
0.2000 mg | ORAL_TABLET | Freq: Two times a day (BID) | ORAL | Status: DC
  Administered 2017-10-30 – 2017-11-03 (×8): 0.2 mg via ORAL
  Filled 2017-10-30 (×8): qty 2

## 2017-10-30 MED ORDER — SODIUM CHLORIDE 0.9 % IV BOLUS
500.0000 mL | Freq: Once | INTRAVENOUS | Status: AC
  Administered 2017-10-30: 500 mL via INTRAVENOUS

## 2017-10-30 MED ORDER — ACETAMINOPHEN 650 MG RE SUPP: 650.0000 mg | Freq: Four times a day (QID) | RECTAL | Status: DC | PRN

## 2017-10-30 MED ORDER — POLYETHYLENE GLYCOL 3350 17 G PO PACK
17.0000 g | PACK | Freq: Every day | ORAL | Status: DC
  Administered 2017-11-02 – 2017-11-03 (×2): 17 g via ORAL
  Filled 2017-10-30 (×2): qty 1

## 2017-10-30 NOTE — ED Notes (Signed)
Pt had cva   10/12/2017  According  To husband pt stayed at [facility] for [facility] days and  Was sent  To   [facility] according to husband pt is having trouble swallowing meds    And is also having trouble  Eating . And is constipated

## 2017-10-30 NOTE — ED Notes (Signed)
Pt had large  Loose  bm    Liquid  consistancy

## 2017-10-30 NOTE — ED Notes (Signed)
Lab notified to run urine sample.

## 2017-10-30 NOTE — H&P (Addendum)
[facility] [place] NAME: Sonya [name]    MR#:  [id number]  DATE OF BIRTH:  07-17-1932  DATE OF ADMISSION:  10/30/2017  PRIMARY CARE PHYSICIAN: Baxter Hire, MD   REQUESTING/REFERRING PHYSICIAN: Schuyler Amor, MD  CHIEF COMPLAINT:   Chief Complaint  Patient presents with  . Emesis  . Abdominal Pain    HISTORY OF PRESENT ILLNESS: Sonya [name]  is a 84 y.o. female with a known history of coronary artery disease, GERD, hypertension, hyperlipidemia who had a stroke 2 weeks ago.  She had a left upper extremity left lower extremity weakness and facial droop.  Patient was in rehab when she started having abdominal pain nausea vomiting for the past few days.  Patient also has had poor appetite.  In the ER CT scan of the abdomen shows  Choldeocolithiasis.  Patient also was noted to have severe abdominal arteries stenosis as well.  PAST MEDICAL HISTORY:   Past Medical History:  Diagnosis Date  . CAD (coronary artery disease)   . Carpal tunnel syndrome   . GERD (gastroesophageal reflux disease)   . History of shingles   . Hormone receptor positive breast cancer ([place])   . Hyperlipemia   . Hypertension   . Stroke [facility])     PAST SURGICAL HISTORY:  Past Surgical History:  Procedure Laterality Date  . ABDOMINAL HYSTERECTOMY    . APPENDECTOMY    . cornoary angioplasty      SOCIAL HISTORY:  Social History   Tobacco Use  . Smoking status: Former Research scientist (life sciences)  . Smokeless tobacco: Never Used  Substance Use Topics  . Alcohol use: Not Currently    FAMILY HISTORY:  Family History  Problem Relation Age of Onset  . Hypertension Son   . Ovarian cancer Mother     DRUG ALLERGIES:  Allergies  Allergen Reactions  . Clopidogrel Nausea And Vomiting     reported by [place] 11/24/13  . Omeprazole Other (See Comments)    Unknown reaction - reported by [place] 11/24/13    REVIEW OF SYSTEMS:    CONSTITUTIONAL: No fever, fatigue or weakness.  EYES: No blurred or double vision.  EARS, NOSE, AND THROAT: No tinnitus or ear pain.  RESPIRATORY: No cough, shortness of breath, wheezing or hemoptysis.  CARDIOVASCULAR: No chest pain, orthopnea, edema.  GASTROINTESTINAL: + nausea, vomiting, no diarrhea or positive abdominal pain.  GENITOURINARY: No dysuria, hematuria.  ENDOCRINE: No polyuria, nocturia,  HEMATOLOGY: No anemia, easy bruising or bleeding SKIN: No rash or lesion. MUSCULOSKELETAL: No joint pain or arthritis.   NEUROLOGIC: Left upper extremity left lower extremity weakness PSYCHIATRY: No anxiety or depression.   MEDICATIONS AT HOME:  Prior to Admission medications   Medication Sig Start Date End Date Taking? Authorizing Provider  acetaminophen (TYLENOL) 325 MG tablet Take 2 tablets (650 mg total) by mouth every 4 (four) hours as needed for mild pain (or temp > 37.5 C (99.5 F)). 10/15/17   Roxan Hockey, MD  aspirin 325 MG tablet Take 1 tablet (325 mg total) by mouth daily. With Food 10/16/17   Roxan Hockey, MD  b complex vitamins tablet Take 1 tablet by mouth daily.    [provider]  benazepril (LOTENSIN) 20 MG tablet Take 20 mg by mouth at bedtime.  09/10/17   [provider]  Cholecalciferol (VITAMIN D3) 1000 units CAPS Take 3,000 Units by mouth at bedtime.  09/07/17   [provider]  cloNIDine (CATAPRES) 0.2 MG tablet Take 1 tablet (0.2 mg total) by mouth 2 (two) times daily. 10/15/17   Roxan Hockey, MD  lansoprazole (PREVACID) 15 MG capsule Take 15 mg by mouth at bedtime.     [provider]  levothyroxine (SYNTHROID, LEVOTHROID) 25 MCG tablet Take 25 mcg by mouth daily before breakfast.  09/10/17   [provider]  metoprolol tartrate (LOPRESSOR) 25 MG tablet Take 1 tablet (25 mg total) by mouth 2 (two) times daily. 10/15/17   Roxan Hockey, MD  neomycin-bacitracin-polymyxin (NEOSPORIN) [phone] ointment Apply 1  application topically daily. Apply to wounds on legs    [provider]  senna-docusate (SENOKOT-S) 8.6-50 MG tablet Take 2 tablets by mouth at bedtime. 10/15/17   Roxan Hockey, MD  simvastatin (ZOCOR) 40 MG tablet Take 1 tablet (40 mg total) by mouth daily at 6 PM. 10/15/17   Denton Brick, Cour[place], MD  venlafaxine XR (EFFEXOR-XR) 150 MG 24 hr capsule Take 150 mg by mouth at bedtime. 09/10/17   [provider]      PHYSICAL EXAMINATION:   VITAL SIGNS: Blood pressure (!) 176/61, pulse 70, temperature 98.8 F (37.1 C), temperature source Oral, resp. rate 14, weight 55 kg, SpO2 96 %.  GENERAL:  84 y.o.-year-old patient lying in the bed with no acute distress.  EYES: Pupils equal, round, reactive to light and accommodation. No scleral icterus. Extraocular muscles intact.  HEENT: Head atraumatic, normocephalic. Oropharynx and nasopharynx clear.  NECK:  Supple, no jugular venous distention. No thyroid enlargement, no tenderness.  LUNGS: Normal breath sounds bilaterally, no wheezing, rales,rhonchi or crepitation. No use of accessory muscles of respiration.  CARDIOVASCULAR: S1, S2 normal. No murmurs, rubs, or gallops.  ABDOMEN: Soft, nontender, mild tenderness bowel sounds present. No organomegaly or mass.  EXTREMITIES: No pedal edema, cyanosis, or clubbing.  NEUROLOGIC: Cranial nerves II through XII are intact.  Decreased muscle strength in the left upper extremity left lower extremity she also has a facial droop  sensation intact. Gait not checked.  PSYCHIATRIC: The patient is alert and oriented x 3.  SKIN: No obvious rash, lesion, or ulcer.   LABORATORY PANEL:   CBC Recent Labs  Lab 10/30/17 1159  WBC 14.5*  HGB 14.4  HCT 44.0  PLT 254  MCV 85.9  MCH 28.0  MCHC 32.6  RDW 13.7  LYMPHSABS 2.0  MONOABS 1.0*  EOSABS 0.2  BASOSABS 0.1   ------------------------------------------------------------------------------------------------------------------  Chemistries   Recent Labs  Lab 10/30/17 1159  NA 139  K 5.0  CL 102  CO2 26  GLUCOSE 112*  BUN 45*  CREATININE 1.12*  CALCIUM 9.7  AST 27  ALT 15  ALKPHOS 80  BILITOT 0.8   ------------------------------------------------------------------------------------------------------------------ estimated creatinine clearance is 29.6 mL/min (A) (by C-G formula based on SCr of 1.12 mg/dL (H)). ------------------------------------------------------------------------------------------------------------------ No results for input(s): TSH, T4TOTAL, T3FREE, THYROIDAB in the last 72 hours.  Invalid input(s): FREET3   Coagulation profile Recent Labs  Lab 10/30/17 1256  INR 1.06   ------------------------------------------------------------------------------------------------------------------- No results for input(s): DDIMER in the last 72 hours. -------------------------------------------------------------------------------------------------------------------  Cardiac Enzymes Recent Labs  Lab 10/30/17 1159  TROPONINI <0.03   ------------------------------------------------------------------------------------------------------------------ Invalid input(s): POCBNP  ---------------------------------------------------------------------------------------------------------------  Urinalysis    Component Value Date/Time   COLORURINE YELLOW (A) 10/30/2017 1132   APPEARANCEUR HAZY (A) 10/30/2017 1132   LABSPEC 1.018 10/30/2017 1132   PHURINE 5.0 10/30/2017 1132   GLUCOSEU NEGATIVE 10/30/2017 1132   HGBUR NEGATIVE 10/30/2017 [place] 10/30/2017 1132  KETONESUR NEGATIVE 10/30/2017 1132   PROTEINUR NEGATIVE 10/30/2017 1132   NITRITE NEGATIVE 10/30/2017 1132   LEUKOCYTESUR NEGATIVE 10/30/2017 1132     RADIOLOGY: Ct Abdomen Pelvis Wo Contrast  Result Date: 10/30/2017 CLINICAL DATA:  Nausea and vomiting. EXAM: CT ABDOMEN AND PELVIS WITHOUT CONTRAST TECHNIQUE: Multidetector  CT imaging of the abdomen and pelvis was performed following the standard protocol without IV contrast. COMPARISON:  None. FINDINGS: Lower chest: Lung bases are essentially clear. There may be tiny vague nodular densities in right middle lobe which are likely incidental findings. Coronary artery calcifications. Descending thoracic aorta is heavily calcified and ectatic measuring up to 3.3 cm. Hepatobiliary: Intrahepatic and extrahepatic biliary dilatation. Evidence for an obstructing stone in the distal common bile duct region measuring roughly 1 cm. This is best seen on the coronal reformats, sequence 5, image 41. Common bile duct measures roughly 1.2 cm. Mild distention of the gallbladder. Intrahepatic biliary dilatation appears to be most prominent in the left hepatic lobe. Pancreas: No significant inflammation around the pancreas. Spleen: Low-density mildly exophytic structure involving the inferior aspect of the spleen measures 1.9 cm and nonspecific. Adrenals/Urinary Tract: Adrenal glands are within normal limits. Evidence for chronic atrophy in the left kidney with numerous left renal cysts and left hydroureteronephrosis. Left ureter is dilated down to the pelvis. The distal left ureter is not well visualized and may be atretic. High-density material within the left renal collecting system is probably related to chronic stasis. Evidence for a complex cyst involving the right kidney lower pole that measures up to 5.2 cm. Mild dilatation of the right renal pelvis. Right kidney is slightly malrotated. No significant dilatation of the right ureter. Moderate distention of the urinary bladder. Stomach/Bowel: Fat stranding around the rectum is nonspecific. Moderate sized hiatal hernia. No evidence for bowel obstruction. No focal bowel inflammation. Vascular/Lymphatic: The abdominal aorta is heavily calcified. The origin of the SMA is heavily calcified. Evidence for stenosis involving the origin of the celiac trunk.  Diffuse atherosclerotic disease in the common iliac arteries. No significant lymph node enlargement in the abdomen or pelvis. Reproductive: Status post hysterectomy. No adnexal masses. Other: Stranding in the perirectal region but no significant free fluid. Negative for free air. Musculoskeletal: Anterior wedge compression deformity involving the T12 vertebral body of unknown age. There is probably a compression deformity involving the inferior endplate of L1. Disc space narrowing and disease at L2-L3. IMPRESSION: 1. Biliary obstruction. Evidence for choledocholithiasis with a large stone in the distal common bile duct. There is intrahepatic and extrahepatic biliary dilatation. 2. Chronic obstruction in the distal left ureter of unknown etiology. There is severe atrophy in the left kidney with multiple left renal cysts. 3. Indeterminate 1.9 cm low-density structure in the spleen. Probably an incidental finding. 4. Large right renal cyst. 5. Severe atherosclerotic disease involving the aorta and visceral arteries. Suspect significant stenosis involving the SMA and possibly the celiac trunk. 6. Compression fractures at T12 and L1 of unknown age. Electronically Signed   By: Markus Daft M.D.   On: 10/30/2017 13:39   Dg Chest Port 1 View  Result Date: 10/30/2017 CLINICAL DATA:  Difficulty swallowing and vomiting EXAM: PORTABLE CHEST 1 VIEW COMPARISON:  10/12/2017 FINDINGS: The heart size and mediastinal contours are within normal limits. Both lungs are clear. The visualized skeletal structures are unremarkable. IMPRESSION: No active disease. Electronically Signed   By: Inez Catalina M.D.   On: 10/30/2017 11:56    EKG: Orders placed or performed during the hospital  encounter of 10/30/17  . EKG 12-Lead  . EKG 12-Lead  . EKG 12-Lead  . EKG 12-Lead    IMPRESSION AND PLAN: Patient is 84 year old presenting with abdominal pain  1.  Abdominal pain due to a quality of cholelithiasis her physician has contacted  the on-call physician they have stated that they will like to let Dr. Candis Musa know about the patient for ERCP. I will have to hold her aspirin for now N.p.o. for now  2.  Severe peripheral vascular disease: Patient noted to have Severe atherosclerotic disease involving the aorta and visceral arteries. Suspect significant stenosis involving the SMA and possibly the celiac trunk. In reviewing her recent hospitalization she has severe left scope subclavian stenosis as well as left carotid artery 50 to 70% stenosis-I will ask vascular surgery to evaluate this patient   3.  Essential hypertension continue clonidine and Lotensin Lopressor  4.  Hypothyroidism continue Synthroid  5.  Hyperlipidemia continue Zocor  6.  Depression continue Effexor  7.  DVT prophylaxis with SCDs   All the records are reviewed and case discussed with ED provider. Management plans discussed with the patient, family and they are in agreement.  CODE STATUS: Code Status History    Date Active Date Inactive Code Status Order ID Comments User Context   10/12/2017 2234 10/15/2017 2130 Full Code [id number]  Vianne Bulls, MD ED       TOTAL TIME TAKING CARE OF THIS PATIENT: 8minutes.    Dustin Flock M.D on 10/30/2017 at 3:35 PM  Between 7am to 6pm - Pager - [phone]  After 6pm go to [email or web address] - password [email or web address] [facility]  [phone]  CC: Primary care physician; Baxter Hire, MD

## 2017-10-30 NOTE — ED Notes (Signed)
Pt resting quietly in room, pt's family at bedside informed them the patient will be admitted and we are waiting for the hospital doctor to come and see the patient.

## 2017-10-30 NOTE — Consult Note (Addendum)
Sonya Darby, MD [address]  [place]  [place],  [address]  Main: [phone]  Fax: [phone] Pager: [phone]   Consultation  Referring Provider:     No ref. provider found Primary Care Physician:  Sonya Hire, MD Primary Gastroenterologist: none         Reason for Consultation: poor by mouth intake, choledocholithiasis  Date of Admission:  10/30/2017 Date of Consultation:  10/30/2017         HPI:   Sonya [name] is a 84 y.o. female with history of hypertension,hypothyroidism, coronary Artery disease, status post PCI with stent placement in 2013, stroke on 10/12/2017 on aspirin 378m. [facility] or [facility] her medications last few days. [facility] pured solid diet, thin liquids and crushed medications in applesauce. According to her family, patient has been followed by speech therapy and physical therapy and her swallowing was becoming stronger. She has left-sided hemiplegia, unable to move her left upper extremity, able to move her left leg  Patient is a good historian, her husband and son are bedside who provided most of the history. According to them, patient has been severely constipated and did not have bowel movement for the last 2-3 weeks. She has been feeling nauseous after eating small bites and eating only yogurt. She says, nothing was tasting good and she has been taking pills with apple sauce. She does not report difficulty swallowing. She also reports generalized abdominal pain. She was given stool softener in the ER followed by an episode of large loose brown bowel movement in the ER. She feels that her abdominal pain got significantly better after a BM. She had basic labs in the ER, CBC revealed mildly elevated leukocytosis to 14.5, LFTs were normal,  lipase just above normal at 53.   Patient underwent CT A/P due to abdominal pain and incidentally found to have 1 cm obstructing stone in the distal CBD with intrahepatic and extrahepatic biliary dilation. Mild distention of the gallbladder. CBD 1.2 cm. Pancreas was normal.  GI consulted for further evaluation including EGD and ERCP  NSAIDs: aspirin 3231mgiven recent stroke  Antiplts/Anticoagulants/Anti thrombotics: aspirin 325 mg daily given recent stroke  GI Procedures: unknown  Past Medical History:  Diagnosis Date  . CAD (coronary artery disease)   . Carpal tunnel syndrome   . GERD (gastroesophageal reflux disease)   . History of shingles   . Hormone receptor positive breast cancer ([facility]  . Hyperlipemia   . Hypertension   . [facility]    Past Surgical History:  Procedure Laterality Date  . ABDOMINAL HYSTERECTOMY    . APPENDECTOMY    . cornoary angioplasty      Prior to Admission medications   Medication Sig Start Date End Date Taking? Authorizing Provider  acetaminophen (TYLENOL) 325 MG tablet Take 2 tablets (650 mg total) by mouth every 4 (four) hours as needed for mild pain (or temp > 37.5 C (99.5 F)). 10/15/17   EmRoxan HockeyMD  aspirin 325 MG tablet Take 1 tablet (325 mg total) by mouth daily. With Food 10/16/17   EmRoxan HockeyMD  b complex vitamins tablet Take 1 tablet by mouth daily.    [provider]  benazepril (LOTENSIN) 20 MG tablet Take 20 mg  by mouth at bedtime.  09/10/17   [provider]  Cholecalciferol (VITAMIN D3) 1000 units CAPS Take 3,000 Units by mouth at bedtime.  09/07/17   [provider]  cloNIDine (CATAPRES) 0.2 MG tablet Take 1 tablet (0.2 mg total) by mouth 2 (two) times daily. 10/15/17   Sonya Hockey, MD  lansoprazole (PREVACID) 15 MG capsule Take 15 mg by mouth at bedtime.     [provider]  levothyroxine (SYNTHROID, LEVOTHROID) 25 MCG tablet Take 25 mcg by mouth daily before breakfast.   09/10/17   [provider]  metoprolol tartrate (LOPRESSOR) 25 MG tablet Take 1 tablet (25 mg total) by mouth 2 (two) times daily. 10/15/17   Sonya Hockey, MD  neomycin-bacitracin-polymyxin (NEOSPORIN) [phone] ointment Apply 1 application topically daily. Apply to wounds on legs    [provider]  senna-docusate (SENOKOT-S) 8.6-50 MG tablet Take 2 tablets by mouth at bedtime. 10/15/17   Sonya Hockey, MD  simvastatin (ZOCOR) 40 MG tablet Take 1 tablet (40 mg total) by mouth daily at 6 PM. 10/15/17   Sonya [name], Courage, MD  venlafaxine XR (EFFEXOR-XR) 150 MG 24 hr capsule Take 150 mg by mouth at bedtime. 09/10/17   [provider]    Family History  Problem Relation Age of Onset  . Hypertension Son   . Ovarian cancer Mother      Social History   Tobacco Use  . Smoking status: Former Research scientist (life sciences)  . Smokeless tobacco: Never Used  Substance Use Topics  . Alcohol use: Not Currently  . Drug use: Never    Allergies as of 10/30/2017 - Review Complete 10/30/2017  Allergen Reaction Noted  . Clopidogrel Nausea And Vomiting 10/12/2017  . Omeprazole Other (See Comments) 10/12/2017    Review of Systems:    All systems reviewed and negative except where noted in HPI.   Physical Exam:  Vital signs in last 24 hours: Temp:  [97.9 F (36.6 C)-98.8 F (37.1 C)] 98.8 F (37.1 C) (09/13 1431) Pulse Rate:  [70-76] 76 (09/13 1600) Resp:  [14-17] 14 (09/13 1600) BP: (80-196)/(61-84) 161/67 (09/13 1600) SpO2:  [93 %-97 %] 93 % (09/13 1600) Weight:  [55 kg] 55 kg (09/13 1134)   General:   Pleasant, cooperative in NAD, appears weak Head:  Normocephalic and atraumatic, right facial droop, slurred speech. Eyes:   No icterus.   Conjunctiva pink. PERRLA. Ears:  Normal auditory acuity. Neck:  Supple; no masses or thyroidomegaly Lungs: Respirations even and unlabored. Lungs clear to auscultation bilaterally.   No wheezes, crackles, or rhonchi.  Heart:  Regular rate and  rhythm;  Without murmur, clicks, rubs or gallops Abdomen:  Soft, nondistended, mild epigastric tenderness. Normal bowel sounds. No appreciable masses or hepatomegaly.  No rebound or guarding.  Rectal:  Not performed. Msk:  Symmetrical without gross deformities.  left-sided weakness  Extremities:  Without edema, cyanosis or clubbing. Neurologic:  Alert and oriented x3;  grossly normal neurologically. Skin:  Intact without significant lesions or rashes. Cervical Nodes:  No significant cervical adenopathy. Psych:  Alert and cooperative. Normal affect.  LAB RESULTS: CBC Latest Ref Rng & Units 10/30/2017 10/12/2017  WBC 3.6 - 11.0 K/uL 14.5(H) 12.7(H)  Hemoglobin 12.0 - 16.0 g/dL 14.4 13.9  Hematocrit 35.0 - 47.0 % 44.0 45.5  Platelets 150 - 440 K/uL 254 221    BMET BMP Latest Ref Rng & Units 10/30/2017 10/12/2017  Glucose 70 - 99 mg/dL 112(H) 112(H)  BUN 8 - 23 mg/dL 45(H) 19  Creatinine  0.44 - 1.00 mg/dL 1.12(H) 1.00  Sodium 135 - 145 mmol/L 139 141  Potassium 3.5 - 5.1 mmol/L 5.0 4.3  Chloride 98 - 111 mmol/L 102 106  CO2 22 - 32 mmol/L 26 26  Calcium 8.9 - 10.3 mg/dL 9.7 9.2    LFT Hepatic Function Latest Ref Rng & Units 10/30/2017 10/12/2017  Total Protein 6.5 - 8.1 g/dL 7.2 6.7  Albumin 3.5 - 5.0 g/dL 3.2(L) 3.5  AST 15 - 41 U/L 27 27  ALT 0 - 44 U/L 15 17  Alk Phosphatase 38 - 126 U/L 80 74  Total Bilirubin 0.3 - 1.2 mg/dL 0.8 1.1     STUDIES: Ct Abdomen Pelvis Wo Contrast  Result Date: 10/30/2017 CLINICAL DATA:  Nausea and vomiting. EXAM: CT ABDOMEN AND PELVIS WITHOUT CONTRAST TECHNIQUE: Multidetector CT imaging of the abdomen and pelvis was performed following the standard protocol without IV contrast. COMPARISON:  None. FINDINGS: Lower chest: Lung bases are essentially clear. There may be tiny vague nodular densities in right middle lobe which are likely incidental findings. Coronary artery calcifications. Descending thoracic aorta is heavily calcified and ectatic measuring  up to 3.3 cm. Hepatobiliary: Intrahepatic and extrahepatic biliary dilatation. Evidence for an obstructing stone in the distal common bile duct region measuring roughly 1 cm. This is best seen on the coronal reformats, sequence 5, image 41. Common bile duct measures roughly 1.2 cm. Mild distention of the gallbladder. Intrahepatic biliary dilatation appears to be most prominent in the left hepatic lobe. Pancreas: No significant inflammation around the pancreas. Spleen: Low-density mildly exophytic structure involving the inferior aspect of the spleen measures 1.9 cm and nonspecific. Adrenals/Urinary Tract: Adrenal glands are within normal limits. Evidence for chronic atrophy in the left kidney with numerous left renal cysts and left hydroureteronephrosis. Left ureter is dilated down to the pelvis. The distal left ureter is not well visualized and may be atretic. High-density material within the left renal collecting system is probably related to chronic stasis. Evidence for a complex cyst involving the right kidney lower pole that measures up to 5.2 cm. Mild dilatation of the right renal pelvis. Right kidney is slightly malrotated. No significant dilatation of the right ureter. Moderate distention of the urinary bladder. Stomach/Bowel: Fat stranding around the rectum is nonspecific. Moderate sized hiatal hernia. No evidence for bowel obstruction. No focal bowel inflammation. Vascular/Lymphatic: The abdominal aorta is heavily calcified. The origin of the SMA is heavily calcified. Evidence for stenosis involving the origin of the celiac trunk. Diffuse atherosclerotic disease in the common iliac arteries. No significant lymph node enlargement in the abdomen or pelvis. Reproductive: Status post hysterectomy. No adnexal masses. Other: Stranding in the perirectal region but no significant free fluid. Negative for free air. Musculoskeletal: Anterior wedge compression deformity involving the T12 vertebral body of unknown  age. There is probably a compression deformity involving the inferior endplate of L1. Disc space narrowing and disease at L2-L3. IMPRESSION: 1. Biliary obstruction. Evidence for choledocholithiasis with a large stone in the distal common bile duct. There is intrahepatic and extrahepatic biliary dilatation. 2. Chronic obstruction in the distal left ureter of unknown etiology. There is severe atrophy in the left kidney with multiple left renal cysts. 3. Indeterminate 1.9 cm low-density structure in the spleen. Probably an incidental finding. 4. Large right renal cyst. 5. Severe atherosclerotic disease involving the aorta and visceral arteries. Suspect significant stenosis involving the SMA and possibly the celiac trunk. 6. Compression fractures at T12 and L1 of unknown age. Electronically Signed  By: Markus Daft M.D.   On: 10/30/2017 13:39   Dg Chest Port 1 View  Result Date: 10/30/2017 CLINICAL DATA:  Difficulty swallowing and vomiting EXAM: PORTABLE CHEST 1 VIEW COMPARISON:  10/12/2017 FINDINGS: The heart size and mediastinal contours are within normal limits. Both lungs are clear. The visualized skeletal structures are unremarkable. IMPRESSION: No active disease. Electronically Signed   By: Inez Catalina M.D.   On: 10/30/2017 11:56      Impression / Plan:   Sonya [name] is a 84 y.o. 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 She is also incidentally found to have choledocholithiasis based on CT  Poor by mouth intake:nausea and abdominal pain, probably secondary to severe constipation Aggressive bowel regimen Recommend speech pathology evaluation to evaluate for oropharyngeal dysphagia Strict nothing by mouth for  now  Choledocholithiasis with biliary ductal dilation:there is no evidence of cholangitis LFTs are normal Lipase is mildly elevated and not convincing for acute pancreatitis Performing ERCP for subclinical choledocholithiasis, with history of recent stroke and on aspirin 325 mg daily is associated with greater risks compared to benefits  Monitor LFTs for now Patient has to be off aspirin 377m for 5 days prior to ERCP Okay to switch to aspirin 81 mg and continue Would appreciate neurology input regarding recommendation about discontinuation of full dose aspirin  Explained my recommendations to the patient and her family who are in understanding of the plan  Thank you for involving me in the care of this patient.  Sonya [name] [place] cover for the weekend     LOS: 0 days   RSherri Sear MD  10/30/2017, 4:46 PM   Note: This dictation was prepared with Dragon dictation along with smaller phrase technology. Any transcriptional errors that result from this process are unintentional.

## 2017-10-30 NOTE — Consult Note (Signed)
[facility] Vascular Consult Note  MRN : [id number]  Sonya [name] is a 84 y.o. (1932-03-28) female who presents with chief complaint of  Chief Complaint  Patient presents with  . Emesis  . Abdominal Pain  .  History of Present Illness:   I am asked to see the patient by Dr. Posey Pronto  The patient is an 83 year old woman who presented to [facility] with abdominal pain.  In the emergency room she had a large bowel movement she is substernally had another bowel movement since getting to the floor and this has relieved her abdominal complaints.  Recently she sustained a right hemispheric stroke.  CT angiogram demonstrated mild stenosis on the right and moderate to severe stenosis on the left.  Profound intracranial disease is noted.  The patient denies rest pain or dangling of an extremity off the side of the bed during the night for relief. No open wounds or sores at this time. No prior interventions or surgeries.  No history of back problems or DJD of the lumbar sacral spine.   The patient denies changes in claudication symptoms or new rest pain symptoms.  No new ulcers or wounds of the foot.  The patient's blood pressure has been stable and relatively well controlled.  The patient denies history of DVT, PE or superficial thrombophlebitis. The patient denies recent episodes of angina or shortness of breath.   Current Meds  Medication Sig  . acetaminophen (TYLENOL) 325 MG tablet Take 2 tablets (650 mg total) by mouth every 4 (four) hours as needed for mild pain (or temp > 37.5 C (99.5 F)).  Marland Kitchen aspirin 325 MG tablet Take 1 tablet (325 mg total) by mouth daily. With Food  . b complex vitamins tablet Take 1 tablet by mouth daily.  . benazepril (LOTENSIN) 20 MG tablet Take 20 mg by mouth at bedtime.   . bisacodyl (DUCODYL) 5 MG EC tablet Take 10 mg by mouth daily.  . Cholecalciferol (VITAMIN D3) 1000 units CAPS Take 3,000 Units by mouth at bedtime.   .  cloNIDine (CATAPRES) 0.2 MG tablet Take 1 tablet (0.2 mg total) by mouth 2 (two) times daily.  . lansoprazole (PREVACID) 15 MG capsule Take 15 mg by mouth at bedtime.   Marland Kitchen levothyroxine (SYNTHROID, LEVOTHROID) 25 MCG tablet Take 25 mcg by mouth daily before breakfast.   . magic mouthwash SOLN Take 5 mLs by mouth 3 (three) times daily. Swish and spit  . metoprolol tartrate (LOPRESSOR) 25 MG tablet Take 1 tablet (25 mg total) by mouth 2 (two) times daily.  Marland Kitchen nystatin (MYCOSTATIN) 100000 UNIT/ML suspension Take 5 mLs by mouth 4 (four) times daily.  . ondansetron (ZOFRAN-ODT) 4 MG disintegrating tablet Take 4 mg by mouth every 8 (eight) hours as needed for nausea or vomiting.  . senna-docusate (SENOKOT-S) 8.6-50 MG tablet Take 2 tablets by mouth at bedtime.  . simvastatin (ZOCOR) 40 MG tablet Take 1 tablet (40 mg total) by mouth daily at 6 PM.  . venlafaxine XR (EFFEXOR-XR) 150 MG 24 hr capsule Take 150 mg by mouth at bedtime.    Past Medical History:  Diagnosis Date  . CAD (coronary artery disease)   . Carpal tunnel syndrome   . GERD (gastroesophageal reflux disease)   . History of shingles   . Hormone receptor positive breast cancer ([place])   . Hyperlipemia   . Hypertension   . Stroke [facility])     Past Surgical History:  Procedure Laterality Date  .  ABDOMINAL HYSTERECTOMY    . APPENDECTOMY    . cornoary angioplasty      Social History Social History   Tobacco Use  . Smoking status: Former Research scientist (life sciences)  . Smokeless tobacco: Never Used  Substance Use Topics  . Alcohol use: Not Currently  . Drug use: Never    Family History Family History  Problem Relation Age of Onset  . Hypertension Son   . Ovarian cancer Mother   No family history of bleeding/clotting disorders, porphyria or autoimmune disease   Allergies  Allergen Reactions  . Clopidogrel Nausea And Vomiting     reported by [facility] 11/24/13  . Omeprazole Other (See Comments)    Unknown reaction -  reported by [facility] 11/24/13     REVIEW OF SYSTEMS (Negative unless checked)  Constitutional: [] Weight loss  [] Fever  [] Chills Cardiac: [] Chest pain   [] Chest pressure   [] Palpitations   [] Shortness of breath when laying flat   [] Shortness of breath at rest   [] Shortness of breath with exertion. Vascular:  [x] Pain in legs with walking   [] Pain in legs at rest   [] Pain in legs when laying flat   [] Claudication   [] Pain in feet when walking  [] Pain in feet at rest  [] Pain in feet when laying flat   [] History of DVT   [] Phlebitis   [] Swelling in legs   [] Varicose veins   [] Non-healing ulcers Pulmonary:   [] Uses home oxygen   [] Productive cough   [] Hemoptysis   [] Wheeze  [] COPD   [] Asthma Neurologic:  [] Dizziness  [] Blackouts   [] Seizures   [x] History of stroke   [] History of TIA  [] Aphasia   [] Temporary blindness   [] Dysphagia   [] Weakness or numbness in arms   [] Weakness or numbness in legs Musculoskeletal:  [] Arthritis   [x] Joint swelling   [] Joint pain   [] Low back pain Hematologic:  [] Easy bruising  [] Easy bleeding   [] Hypercoagulable state   [] Anemic  [] Hepatitis Gastrointestinal:  [] Blood in stool   [] Vomiting blood  [x] Gastroesophageal reflux/heartburn   [] Difficulty swallowing. Genitourinary:  [] Chronic kidney disease   [] Difficult urination  [] Frequent urination  [] Burning with urination   [] Blood in urine Skin:  [] Rashes   [] Ulcers   [] Wounds Psychological:  [] History of anxiety   []  History of major depression.  Physical Examination  Vitals:   10/30/17 1530 10/30/17 1557 10/30/17 1600 10/30/17 1659  BP: (!) 192/73 (!) 196/77 (!) 161/67 (!) 187/83  Pulse:  73 76 70  Resp: 16 14 14 16   Temp:    98.1 F (36.7 C)  TempSrc:    Oral  SpO2:  93% 93% 99%  Weight:       Body mass index is 23.68 kg/m. Gen:  WD/WN, NAD Head: [place]/AT, No temporalis wasting. Prominent temp pulse not noted. Ear/Nose/Throat: Hearing grossly intact, nares w/o erythema or drainage,  oropharynx w/o Erythema/Exudate Eyes: PERRLA, EOMI.  Neck: Supple, no nuchal rigidity.  No bruit or JVD.  Pulmonary:  Good air movement, clear to auscultation bilaterally.  Cardiac: RRR, normal S1, S2, no Murmurs, rubs or gallops. Vascular: Bilateral carotid bruits  Vessel Right Left  Radial Palpable Not Palpable  Brachial Palpable Race Palpable  Carotid Palpable, + bruit Palpable, + bruit  Aorta Not palpable N/A  Femoral Palpable Palpable  Popliteal Not Palpable Not Palpable  PT Not Palpable Not Palpable  DP Not Palpable Not Palpable   Gastrointestinal: soft, non-tender/non-distended. No guarding/reflex. No masses, surgical incisions, or scars. Musculoskeletal:  M/S 5/5 throughout.  Extremities without ischemic changes.  No deformity or atrophy. No edema. Neurologic: CN 2-12 intact. Pain and light touch intact in extremities.  Symmetrical.  Speech is fluent. Motor exam as listed above. Psychiatric: Judgment intact, Mood & affect appropriate for pt's clinical situation. Dermatologic: No rashes or ulcers noted.  No cellulitis or open wounds. Lymph : No Cervical, Axillary, or Inguinal lymphadenopathy.    CBC Lab Results  Component Value Date   WBC 14.5 (H) 10/30/2017   HGB 14.4 10/30/2017   HCT 44.0 10/30/2017   MCV 85.9 10/30/2017   PLT 254 10/30/2017    BMET    Component Value Date/Time   NA 139 10/30/2017 1159   K 5.0 10/30/2017 1159   CL 102 10/30/2017 1159   CO2 26 10/30/2017 1159   GLUCOSE 112 (H) 10/30/2017 1159   BUN 45 (H) 10/30/2017 1159   CREATININE 1.12 (H) 10/30/2017 1159   CALCIUM 9.7 10/30/2017 1159   GFRNONAA 44 (L) 10/30/2017 1159   GFRAA 51 (L) 10/30/2017 1159   Estimated Creatinine Clearance: 29.6 mL/min (A) (by C-G formula based on SCr of 1.12 mg/dL (H)).  COAG Lab Results  Component Value Date   INR 1.06 10/30/2017   INR 1.03 10/12/2017    Radiology Ct Abdomen Pelvis Wo Contrast  Result Date: 10/30/2017 CLINICAL DATA:  Nausea and  vomiting. EXAM: CT ABDOMEN AND PELVIS WITHOUT CONTRAST TECHNIQUE: Multidetector CT imaging of the abdomen and pelvis was performed following the standard protocol without IV contrast. COMPARISON:  None. FINDINGS: Lower chest: Lung bases are essentially clear. There may be tiny vague nodular densities in right middle lobe which are likely incidental findings. Coronary artery calcifications. Descending thoracic aorta is heavily calcified and ectatic measuring up to 3.3 cm. Hepatobiliary: Intrahepatic and extrahepatic biliary dilatation. Evidence for an obstructing stone in the distal common bile duct region measuring roughly 1 cm. This is best seen on the coronal reformats, sequence 5, image 41. Common bile duct measures roughly 1.2 cm. Mild distention of the gallbladder. Intrahepatic biliary dilatation appears to be most prominent in the left hepatic lobe. Pancreas: No significant inflammation around the pancreas. Spleen: Low-density mildly exophytic structure involving the inferior aspect of the spleen measures 1.9 cm and nonspecific. Adrenals/Urinary Tract: Adrenal glands are within normal limits. Evidence for chronic atrophy in the left kidney with numerous left renal cysts and left hydroureteronephrosis. Left ureter is dilated down to the pelvis. The distal left ureter is not well visualized and may be atretic. High-density material within the left renal collecting system is probably related to chronic stasis. Evidence for a complex cyst involving the right kidney lower pole that measures up to 5.2 cm. Mild dilatation of the right renal pelvis. Right kidney is slightly malrotated. No significant dilatation of the right ureter. Moderate distention of the urinary bladder. Stomach/Bowel: Fat stranding around the rectum is nonspecific. Moderate sized hiatal hernia. No evidence for bowel obstruction. No focal bowel inflammation. Vascular/Lymphatic: The abdominal aorta is heavily calcified. The origin of the SMA is  heavily calcified. Evidence for stenosis involving the origin of the celiac trunk. Diffuse atherosclerotic disease in the common iliac arteries. No significant lymph node enlargement in the abdomen or pelvis. Reproductive: Status post hysterectomy. No adnexal masses. Other: Stranding in the perirectal region but no significant free fluid. Negative for free air. Musculoskeletal: Anterior wedge compression deformity involving the T12 vertebral body of unknown age. There is probably a compression deformity involving the inferior endplate of L1. Disc space  narrowing and disease at L2-L3. IMPRESSION: 1. Biliary obstruction. Evidence for choledocholithiasis with a large stone in the distal common bile duct. There is intrahepatic and extrahepatic biliary dilatation. 2. Chronic obstruction in the distal left ureter of unknown etiology. There is severe atrophy in the left kidney with multiple left renal cysts. 3. Indeterminate 1.9 cm low-density structure in the spleen. Probably an incidental finding. 4. Large right renal cyst. 5. Severe atherosclerotic disease involving the aorta and visceral arteries. Suspect significant stenosis involving the SMA and possibly the celiac trunk. 6. Compression fractures at T12 and L1 of unknown age. Electronically Signed   By: Markus Daft M.D.   On: 10/30/2017 13:39   Ct Angio Head W Or Wo Contrast  Result Date: 10/13/2017 CLINICAL DATA:  84 y/o F; left-sided facial droop and slurred speech. EXAM: CT ANGIOGRAPHY HEAD AND NECK TECHNIQUE: Multidetector CT imaging of the head and neck was performed using the standard protocol during bolus administration of intravenous contrast. Multiplanar CT image reconstructions and MIPs were obtained to evaluate the vascular anatomy. Carotid stenosis measurements (when applicable) are obtained utilizing NASCET criteria, using the distal internal carotid diameter as the denominator. CONTRAST:  60mL ISOVUE-370 IOPAMIDOL (ISOVUE-370) INJECTION 76%  COMPARISON:  10/12/2017 CT head.  10/13/2017 MRI head. FINDINGS: CTA NECK FINDINGS Aortic arch: Bovine variant branching. Imaged portion shows no evidence of aneurysm or dissection. Left subclavian artery extensive calcified plaque with moderate to severe 70% proximal stenosis. Right carotid system: No evidence of dissection, stenosis (50% or greater) or occlusion. Extensive calcified plaque of the carotid bifurcation with mild less than 50% proximal ICA stenosis. Left carotid system: No evidence of dissection or occlusion. Severe calcified plaque of the left carotid bifurcation with moderate 50-70% proximal ICA stenosis. Vertebral arteries: Calcified plaque of the bilateral vertebral arteries with severe stenosis of the vertebral artery origins bilaterally. Mild calcific atherosclerosis of the left V1 segment. Skeleton: Moderate cervical spondylosis with disc and facet degenerative changes greatest at the C4-C7 levels. No high-grade bony canal stenosis. Other neck: Negative. Upper chest: Negative. Review of the MIP images confirms the above findings CTA HEAD FINDINGS Anterior circulation: Right MCA distribution mid to distal M2 branch occlusion within the mid sylvian fissure (series 12, image 10). No additional large vessel occlusion, aneurysm, or high-grade stenosis. Calcified plaque of the carotid siphons with mild less than 50% paraclinoid stenosis. Posterior circulation: No significant stenosis, proximal occlusion, aneurysm, or vascular malformation. Venous sinuses: As permitted by contrast timing, patent. Anatomic variants: Complete circle-of-Willis. Delayed phase: No abnormal intracranial enhancement. Review of the MIP images confirms the above findings IMPRESSION: 1. Right mid to distal M2 branch occlusion within mid sylvian fissure. 2. No additional intracranial large vessel occlusion, aneurysm, or significant stenosis. 3. Bilateral vertebral artery origin severe stenosis with calcified plaque. 4. Right  proximal ICA mild less than 50% stenosis with calcified plaque. 5. Left proximal ICA moderate 50-70% stenosis with calcified plaque. 6. Left subclavian artery severe greater than 70% proximal stenosis with calcified plaque. These results will be called to the ordering clinician or representative by the Radiologist Assistant, and communication documented in the PACS or zVision Dashboard. Electronically Signed   By: Kristine Garbe M.D.   On: 10/13/2017 03:41   Dg Chest 2 View  Result Date: 10/12/2017 CLINICAL DATA:  84 y/o F; patient presents with left-sided weakness tonight. Back pain from multiple intra discs. EXAM: CHEST - 2 VIEW COMPARISON:  None. FINDINGS: Mild cardiomegaly given projection and technique. Calcific aortic atherosclerosis. No consolidation,  effusion, or pneumothorax. Lower thoracic, probably T12 and L1 mild anterior compression deformities, age indeterminate. IMPRESSION: Lower thoracic, probably T12-L1 mild anterior compression deformities, age indeterminate. No acute pulmonary process identified. Electronically Signed   By: Kristine Garbe M.D.   On: 10/12/2017 23:17   Ct Head Wo Contrast  Result Date: 10/14/2017 CLINICAL DATA:  Follow-up examination for stroke. EXAM: CT HEAD WITHOUT CONTRAST TECHNIQUE: Contiguous axial images were obtained from the base of the skull through the vertex without intravenous contrast. COMPARISON:  Prior studies from 10/13/2017. FINDINGS: Brain: Evolving cytotoxic edema involving the mid and posterior right MCA distribution, consistent with acute to early subacute right MCA territory infarct. Overall, size appears somewhat increased relative to previous MRI. No associated hemorrhage or significant mass effect. No other acute large vessel territory infarct. No mass lesion, midline shift or mass effect. No hydrocephalus. No extra-axial fluid collection. Underlying atrophy with chronic small vessel ischemic disease again noted. Vascular:  Punctate hyperdensity within the distal right sylvian fissure, likely intravascular thrombus. Scattered vascular calcifications noted within the carotid siphons. Skull: Unremarkable. Sinuses/Orbits: Globes and orbital soft tissues demonstrate no acute finding. Paranasal sinuses and mastoid air cells are clear. Other: None. IMPRESSION: 1. Evolving moderate sized acute to early subacute ischemic right MCA territory infarct, slightly increased in size relative to previous MRI. No significant edema or evidence for hemorrhagic transformation. 2. Otherwise stable appearance of the brain. No other acute intracranial abnormality identified. Electronically Signed   By: Jeannine Boga M.D.   On: 10/14/2017 14:05   Ct Head Wo Contrast  Result Date: 10/12/2017 CLINICAL DATA:  Hit head.  Fall. EXAM: CT HEAD WITHOUT CONTRAST TECHNIQUE: Contiguous axial images were obtained from the base of the skull through the vertex without intravenous contrast. COMPARISON:  None. FINDINGS: Brain: There is atrophy and chronic small vessel disease changes. No acute intracranial abnormality. Specifically, no hemorrhage, hydrocephalus, mass lesion, acute infarction, or significant intracranial injury. Vascular: No hyperdense vessel or unexpected calcification. Skull: No acute calvarial abnormality. Sinuses/Orbits: Visualized paranasal sinuses and mastoids clear. Orbital soft tissues unremarkable. Other: None IMPRESSION: No acute intracranial abnormality. Atrophy, chronic microvascular disease. Electronically Signed   By: Rolm Baptise M.D.   On: 10/12/2017 20:53   Ct Angio Neck W Or Wo Contrast  Result Date: 10/13/2017 CLINICAL DATA:  84 y/o F; left-sided facial droop and slurred speech. EXAM: CT ANGIOGRAPHY HEAD AND NECK TECHNIQUE: Multidetector CT imaging of the head and neck was performed using the standard protocol during bolus administration of intravenous contrast. Multiplanar CT image reconstructions and MIPs were obtained to  evaluate the vascular anatomy. Carotid stenosis measurements (when applicable) are obtained utilizing NASCET criteria, using the distal internal carotid diameter as the denominator. CONTRAST:  29mL ISOVUE-370 IOPAMIDOL (ISOVUE-370) INJECTION 76% COMPARISON:  10/12/2017 CT head.  10/13/2017 MRI head. FINDINGS: CTA NECK FINDINGS Aortic arch: Bovine variant branching. Imaged portion shows no evidence of aneurysm or dissection. Left subclavian artery extensive calcified plaque with moderate to severe 70% proximal stenosis. Right carotid system: No evidence of dissection, stenosis (50% or greater) or occlusion. Extensive calcified plaque of the carotid bifurcation with mild less than 50% proximal ICA stenosis. Left carotid system: No evidence of dissection or occlusion. Severe calcified plaque of the left carotid bifurcation with moderate 50-70% proximal ICA stenosis. Vertebral arteries: Calcified plaque of the bilateral vertebral arteries with severe stenosis of the vertebral artery origins bilaterally. Mild calcific atherosclerosis of the left V1 segment. Skeleton: Moderate cervical spondylosis with disc and facet degenerative changes greatest  at the C4-C7 levels. No high-grade bony canal stenosis. Other neck: Negative. Upper chest: Negative. Review of the MIP images confirms the above findings CTA HEAD FINDINGS Anterior circulation: Right MCA distribution mid to distal M2 branch occlusion within the mid sylvian fissure (series 12, image 10). No additional large vessel occlusion, aneurysm, or high-grade stenosis. Calcified plaque of the carotid siphons with mild less than 50% paraclinoid stenosis. Posterior circulation: No significant stenosis, proximal occlusion, aneurysm, or vascular malformation. Venous sinuses: As permitted by contrast timing, patent. Anatomic variants: Complete circle-of-Willis. Delayed phase: No abnormal intracranial enhancement. Review of the MIP images confirms the above findings IMPRESSION: 1.  Right mid to distal M2 branch occlusion within mid sylvian fissure. 2. No additional intracranial large vessel occlusion, aneurysm, or significant stenosis. 3. Bilateral vertebral artery origin severe stenosis with calcified plaque. 4. Right proximal ICA mild less than 50% stenosis with calcified plaque. 5. Left proximal ICA moderate 50-70% stenosis with calcified plaque. 6. Left subclavian artery severe greater than 70% proximal stenosis with calcified plaque. These results will be called to the ordering clinician or representative by the Radiologist Assistant, and communication documented in the PACS or zVision Dashboard. Electronically Signed   By: Kristine Garbe M.D.   On: 10/13/2017 03:41   Mr Brain Wo Contrast  Result Date: 10/13/2017 CLINICAL DATA:  84 y/o  F; left-sided weakness. EXAM: MRI HEAD WITHOUT CONTRAST TECHNIQUE: Multiplanar, multiecho pulse sequences of the brain and surrounding structures were obtained without intravenous contrast. COMPARISON:  10/12/2017 CT head. FINDINGS: Brain: Reduced diffusion within the right posterior insula, frontal operculum, and posterolateral frontal lobe compatible with acute/early subacute infarction. No associated hemorrhage or mass effect. Several nonspecific T2 FLAIR hyperintensities in subcortical and periventricular white matter are compatible with mild chronic microvascular ischemic changes for age. Mild volume loss of the brain. Vascular: Linear focus of susceptibility blooming measuring 7 mm in the right sylvian fissure, likely a vessel thrombus (series 10, image 29). Skull and upper cervical spine: Normal marrow signal. Sinuses/Orbits: Negative.  Bilateral intra-ocular lens replacement. Other: None. IMPRESSION: 1. Acute/early subacute infarction involving the right posterior insula, frontal operculum, and posterolateral frontal lobe. No hemorrhage or mass effect. 2. Linear 7 mm focus of susceptibility hypointensity in right sylvian fissure,  likely a MCA branch vessel thrombus. 3. Mild for age chronic microvascular ischemic changes and volume loss of the brain. These results will be called to the ordering clinician or representative by the Radiologist Assistant, and communication documented in the PACS or zVision Dashboard. Electronically Signed   By: Kristine Garbe M.D.   On: 10/13/2017 01:42   Dg Chest Port 1 View  Result Date: 10/30/2017 CLINICAL DATA:  Difficulty swallowing and vomiting EXAM: PORTABLE CHEST 1 VIEW COMPARISON:  10/12/2017 FINDINGS: The heart size and mediastinal contours are within normal limits. Both lungs are clear. The visualized skeletal structures are unremarkable. IMPRESSION: No active disease. Electronically Signed   By: Inez Catalina M.D.   On: 10/30/2017 11:56     Assessment/Plan 1.  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  At this time her abdominal pain has resolved after having a bowel movement.    Patient should undergo surveillance of the mesenteric arteries with duplex ultrasound.    The patient will follow up with me with a mesenteric duplex.  2.  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  Hypertension:  Patient will continue medical management; nephrology is following no changes in oral medications. 4.  Carotid stenosis: Her recent stroke did not correlate with her carotid disease.  The side with the greater stenosis is asymptomatic and therefore I would recommend continued follow-up at six-month intervals with duplex ultrasound.  She is on maximal medical therapy at this time given her intracranial disease.  No further angiography or invasive treatment at this time.   5.  Coronary artery  disease:  EKG will be monitored. Nitrates will be used if needed. The patient's oral cardiac medications will be continued.     Hortencia Pilar, MD  10/30/2017 7:35 PM

## 2017-10-30 NOTE — ED Notes (Signed)
Pt reports  Feels better    After the bm earlier    Family at bedside

## 2017-10-30 NOTE — ED Notes (Signed)
Moderate    Semi  Formed    Loose  bm and pt cleaned

## 2017-10-30 NOTE — Progress Notes (Signed)
Advanced care plan.  Purpose of the Encounter: CODE STATUS  Parties in Attendance: patient , husband and her son Patient's Decision Capacity: intact  Subjective/Patient's story: Patient is 84 year old with a recent stroke coronary artery disease presents with abdominal pain   Objective/Medical story  Discussed with the patient regarding her desires for cardiac and pulmonary resuscitation  Goals of care determination:   She states that she would like to be a DNR  CODE STATUS: DNR   Time spent discussing advanced care planning: 16 minutes

## 2017-10-30 NOTE — ED Provider Notes (Addendum)
[facility] Emergency Department Provider Note  ____________________________________________   I have reviewed the triage vital signs and the nursing notes. Where available I have reviewed prior notes and, if possible and indicated, outside hospital notes.    HISTORY  Chief Complaint Emesis and Abdominal Pain    HPI Sonya [name] is a 84 y.o. female  Presents today with vomiting, she states that she has some abdominal pain, mostly with vomiting.  She denies any fever chills, no diarrhea.  Has been having this since last night.  Somewhat of a limited historian.  Family also helping.  Patient denies any fever or chills.  No melena no bright red blood per rectum or hematemesis.  Denies any headache or change in her baseline left-sided weakness and left-sided facial droop.  Family states she looks normal.  Level 5 chart caveat; no further history available due to patient status.   Past Medical History:  Diagnosis Date  . CAD (coronary artery disease)   . Carpal tunnel syndrome   . GERD (gastroesophageal reflux disease)   . History of shingles   . Hormone receptor positive breast cancer ([place])   . Hyperlipemia   . Hypertension   . Stroke [facility])     Patient Active Problem List   Diagnosis Date Noted  . Cerebral embolism with cerebral infarction 10/13/2017  . CVA (cerebral vascular accident) ([place]) 10/13/2017  . Hypertension 10/12/2017  . Hypertensive urgency 10/12/2017  . Hypothyroidism 10/12/2017  . Left-sided weakness 10/12/2017  . Elevated troponin 10/12/2017  . Unknown when suspected stroke patient was last well 10/12/2017    Past Surgical History:  Procedure Laterality Date  . ABDOMINAL HYSTERECTOMY    . APPENDECTOMY    . cornoary angioplasty      Prior to Admission medications   Medication Sig Start Date End Date Taking? Authorizing Provider  acetaminophen (TYLENOL) 325 MG tablet Take 2 tablets (650 mg total) by mouth every 4 (four) hours  as needed for mild pain (or temp > 37.5 C (99.5 F)). 10/15/17   Roxan Hockey, MD  aspirin 325 MG tablet Take 1 tablet (325 mg total) by mouth daily. With Food 10/16/17   Roxan Hockey, MD  b complex vitamins tablet Take 1 tablet by mouth daily.    [provider]  benazepril (LOTENSIN) 20 MG tablet Take 20 mg by mouth at bedtime.  09/10/17   [provider]  Cholecalciferol (VITAMIN D3) 1000 units CAPS Take 3,000 Units by mouth at bedtime.  09/07/17   [provider]  cloNIDine (CATAPRES) 0.2 MG tablet Take 1 tablet (0.2 mg total) by mouth 2 (two) times daily. 10/15/17   Roxan Hockey, MD  lansoprazole (PREVACID) 15 MG capsule Take 15 mg by mouth at bedtime.     [provider]  levothyroxine (SYNTHROID, LEVOTHROID) 25 MCG tablet Take 25 mcg by mouth daily before breakfast.  09/10/17   [provider]  metoprolol tartrate (LOPRESSOR) 25 MG tablet Take 1 tablet (25 mg total) by mouth 2 (two) times daily. 10/15/17   Roxan Hockey, MD  neomycin-bacitracin-polymyxin (NEOSPORIN) [phone] ointment Apply 1 application topically daily. Apply to wounds on legs    [provider]  senna-docusate (SENOKOT-S) 8.6-50 MG tablet Take 2 tablets by mouth at bedtime. 10/15/17   Roxan Hockey, MD  simvastatin (ZOCOR) 40 MG tablet Take 1 tablet (40 mg total) by mouth daily at 6 PM. 10/15/17   Emokpae, Courage, MD  venlafaxine XR (EFFEXOR-XR) 150 MG 24 hr capsule Take 150  mg by mouth at bedtime. 09/10/17   [provider]    Allergies Clopidogrel and Omeprazole  Family History  Problem Relation Age of Onset  . Hypertension Son   . Ovarian cancer Mother     Social History Social History   Tobacco Use  . Smoking status: Former Research scientist (life sciences)  . Smokeless tobacco: Never Used  Substance Use Topics  . Alcohol use: Not Currently  . Drug use: Never    Review of Systems Constitutional: No fever/chills Eyes: No visual changes. ENT: No sore  throat. No stiff neck no neck pain Cardiovascular: Denies chest pain. Respiratory: Denies shortness of breath. Gastrointestinal:   + vomiting.  No diarrhea.  No constipation. Genitourinary: Negative for dysuria. Musculoskeletal: Negative lower extremity swelling Skin: Negative for rash. Neurological: Negative for severe headaches, new focal weakness or numbness.   ____________________________________________   PHYSICAL EXAM:  VITAL SIGNS: ED Triage Vitals  Enc Vitals Group     BP 10/30/17 1130 (!) 148/72     Pulse Rate 10/30/17 1130 70     Resp 10/30/17 1130 14     Temp 10/30/17 1131 97.9 F (36.6 C)     Temp Source 10/30/17 1131 Oral     SpO2 10/30/17 1130 97 %     Weight 10/30/17 1134 121 lb 4.1 oz (55 kg)     Height --      Head Circumference --      Peak Flow --      Pain Score 10/30/17 1133 0     Pain Loc --      Pain Edu? --      Excl. in [place]? --     Constitutional: Alert and in no acute distress. W frail elderly woman, aware of her name location, unsure of the date eyes: Conjunctivae are normal Head: Atraumatic HEENT: No congestion/rhinnorhea. Mucous membranes are dry.  Oropharynx non-erythematous Neck:   Nontender with no meningismus, no masses, no stridor Cardiovascular: Normal rate, regular rhythm. Grossly normal heart sounds.  Good peripheral circulation. Respiratory: Normal respiratory effort.  No retractions. Lungs CTAB. Abdominal: Right middle abdominal discomfort with voluntary guarding no rebound. No distention.  Back:  There is no focal tenderness or step off.  there is no midline tenderness there are no lesions noted. there is no CVA tenderness Musculoskeletal: No lower extremity tenderness, no upper extremity tenderness. No joint effusions, no DVT signs strong distal pulses no edema Neurologic:  Normal speech and language.  Left facial droop left-sided weakness as previously noted Skin:  Skin is warm, dry and intact. No rash noted. Psychiatric: Mood  and affect are normal. Speech and behavior are normal.  ____________________________________________   LABS (all labs ordered are listed, but only abnormal results are displayed)  Labs Reviewed  COMPREHENSIVE METABOLIC PANEL - Abnormal; Notable for the following components:      Result Value   Glucose, Bld 112 (*)    BUN 45 (*)    Creatinine, Ser 1.12 (*)    Albumin 3.2 (*)    GFR calc non Af Amer 44 (*)    GFR calc Af Amer 51 (*)    All other components within normal limits  LIPASE, BLOOD - Abnormal; Notable for the following components:   Lipase 53 (*)    All other components within normal limits  CBC WITH DIFFERENTIAL/PLATELET - Abnormal; Notable for the following components:   WBC 14.5 (*)    Neutro Abs 11.2 (*)    Monocytes Absolute 1.0 (*)  All other components within normal limits  URINALYSIS, COMPLETE (UACMP) WITH MICROSCOPIC - Abnormal; Notable for the following components:   Color, Urine YELLOW (*)    APPearance HAZY (*)    All other components within normal limits  URINE CULTURE  TROPONIN I  PROTIME-INR    Pertinent labs  results that were available during my care of the patient were reviewed by me and considered in my medical decision making (see chart for details). ____________________________________________  EKG  I personally interpreted any EKGs ordered by me or triage Sinus rhythm rate 73 bpm, flipped T waves isolated in lead III, no acute ST elevation or depression, LAD noted. ____________________________________________  RADIOLOGY  Pertinent labs & imaging results that were available during my care of the patient were reviewed by me and considered in my medical decision making (see chart for details). If possible, patient and/or family made aware of any abnormal findings.  Ct Abdomen Pelvis Wo Contrast  Result Date: 10/30/2017 CLINICAL DATA:  Nausea and vomiting. EXAM: CT ABDOMEN AND PELVIS WITHOUT CONTRAST TECHNIQUE: Multidetector CT imaging of  the abdomen and pelvis was performed following the standard protocol without IV contrast. COMPARISON:  None. FINDINGS: Lower chest: Lung bases are essentially clear. There may be tiny vague nodular densities in right middle lobe which are likely incidental findings. Coronary artery calcifications. Descending thoracic aorta is heavily calcified and ectatic measuring up to 3.3 cm. Hepatobiliary: Intrahepatic and extrahepatic biliary dilatation. Evidence for an obstructing stone in the distal common bile duct region measuring roughly 1 cm. This is best seen on the coronal reformats, sequence 5, image 41. Common bile duct measures roughly 1.2 cm. Mild distention of the gallbladder. Intrahepatic biliary dilatation appears to be most prominent in the left hepatic lobe. Pancreas: No significant inflammation around the pancreas. Spleen: Low-density mildly exophytic structure involving the inferior aspect of the spleen measures 1.9 cm and nonspecific. Adrenals/Urinary Tract: Adrenal glands are within normal limits. Evidence for chronic atrophy in the left kidney with numerous left renal cysts and left hydroureteronephrosis. Left ureter is dilated down to the pelvis. The distal left ureter is not well visualized and may be atretic. High-density material within the left renal collecting system is probably related to chronic stasis. Evidence for a complex cyst involving the right kidney lower pole that measures up to 5.2 cm. Mild dilatation of the right renal pelvis. Right kidney is slightly malrotated. No significant dilatation of the right ureter. Moderate distention of the urinary bladder. Stomach/Bowel: Fat stranding around the rectum is nonspecific. Moderate sized hiatal hernia. No evidence for bowel obstruction. No focal bowel inflammation. Vascular/Lymphatic: The abdominal aorta is heavily calcified. The origin of the SMA is heavily calcified. Evidence for stenosis involving the origin of the celiac trunk. Diffuse  atherosclerotic disease in the common iliac arteries. No significant lymph node enlargement in the abdomen or pelvis. Reproductive: Status post hysterectomy. No adnexal masses. Other: Stranding in the perirectal region but no significant free fluid. Negative for free air. Musculoskeletal: Anterior wedge compression deformity involving the T12 vertebral body of unknown age. There is probably a compression deformity involving the inferior endplate of L1. Disc space narrowing and disease at L2-L3. IMPRESSION: 1. Biliary obstruction. Evidence for choledocholithiasis with a large stone in the distal common bile duct. There is intrahepatic and extrahepatic biliary dilatation. 2. Chronic obstruction in the distal left ureter of unknown etiology. There is severe atrophy in the left kidney with multiple left renal cysts. 3. Indeterminate 1.9 cm low-density structure in the  spleen. Probably an incidental finding. 4. Large right renal cyst. 5. Severe atherosclerotic disease involving the aorta and visceral arteries. Suspect significant stenosis involving the SMA and possibly the celiac trunk. 6. Compression fractures at T12 and L1 of unknown age. Electronically Signed   By: Markus Daft M.D.   On: 10/30/2017 13:39   Dg Chest Port 1 View  Result Date: 10/30/2017 CLINICAL DATA:  Difficulty swallowing and vomiting EXAM: PORTABLE CHEST 1 VIEW COMPARISON:  10/12/2017 FINDINGS: The heart size and mediastinal contours are within normal limits. Both lungs are clear. The visualized skeletal structures are unremarkable. IMPRESSION: No active disease. Electronically Signed   By: Inez Catalina M.D.   On: 10/30/2017 11:56   ____________________________________________    PROCEDURES  Procedure(s) performed: None  Procedures  Critical Care performed: None  ____________________________________________   INITIAL IMPRESSION / ASSESSMENT AND PLAN / ED COURSE  Pertinent labs & imaging results that were available during my care  of the patient were reviewed by me and considered in my medical decision making (see chart for details).  She is here with vomiting and right-sided abdominal pain.  Nonsurgical abdomen.  Feeling better after fluids and nausea medication.  At her baseline otherwise.  CT scan shows choledocholithiasis, but there is no evidence of elevated liver function test.  I talked to Dr. Marius Ditch.  We much appreciate consult.  She does not feel antibiotic's are indicated at this time.  She does commend admission to the hospitalist service.  No evidence of acute surgical pathology.  She will be admitted for possible ERCP and further observation.  EKG did show flipped T waves, initial troponin is negative.  This will be followed by the hospitalist service.  Family kept abreast of findings.  Patient did have one reading of blood pressure of 80, while she was in the process of having a nonbloody large solid bowel movement here.  It appeared that she may have bagels.  I am not sure if that reading was accurate, all the readings before and act after have been normal.  We will continue to watch it    ____________________________________________   FINAL CLINICAL IMPRESSION(S) / ED DIAGNOSES  Final diagnoses:  Vomiting      This chart was dictated using voice recognition software.  Despite best efforts to proofread,  errors can occur which can change meaning.      Schuyler Amor, MD 10/30/17 1456    Schuyler Amor, MD 10/30/17 [phone]

## 2017-10-30 NOTE — ED Triage Notes (Signed)
Pt arrived via [facility]  From [place]  Pt has had difficulty swallowing  For several  Months  According to ems pt has  Been vomiting  And unable to take meds Pt is awake and alert   History of cva 1  Month ago

## 2017-10-30 NOTE — ED Notes (Signed)
Pt is awake she has  no further loose stools at this time

## 2017-10-31 DIAGNOSIS — K8050 Calculus of bile duct without cholangitis or cholecystitis without obstruction: Secondary | ICD-10-CM

## 2017-10-31 DIAGNOSIS — R4702 Dysphasia: Secondary | ICD-10-CM

## 2017-10-31 LAB — URINE CULTURE: CULTURE: NO GROWTH

## 2017-10-31 LAB — HEPATIC FUNCTION PANEL
ALBUMIN: 2.7 g/dL — AB (ref 3.5–5.0)
ALT: 12 U/L (ref 0–44)
AST: 27 U/L (ref 15–41)
Alkaline Phosphatase: 67 U/L (ref 38–126)
Bilirubin, Direct: 0.3 mg/dL — ABNORMAL HIGH (ref 0.0–0.2)
Indirect Bilirubin: 0.5 mg/dL (ref 0.3–0.9)
TOTAL PROTEIN: 6.1 g/dL — AB (ref 6.5–8.1)
Total Bilirubin: 0.8 mg/dL (ref 0.3–1.2)

## 2017-10-31 LAB — BASIC METABOLIC PANEL
ANION GAP: 3 — AB (ref 5–15)
BUN: 37 mg/dL — ABNORMAL HIGH (ref 8–23)
CO2: 27 mmol/L (ref 22–32)
Calcium: 9.1 mg/dL (ref 8.9–10.3)
Chloride: 110 mmol/L (ref 98–111)
Creatinine, Ser: 1.02 mg/dL — ABNORMAL HIGH (ref 0.44–1.00)
GFR calc Af Amer: 57 mL/min — ABNORMAL LOW (ref >60)
GFR, EST NON AFRICAN AMERICAN: 49 mL/min — AB (ref >60)
GLUCOSE: 87 mg/dL (ref 70–99)
POTASSIUM: 4.4 mmol/L (ref 3.5–5.1)
Sodium: 140 mmol/L (ref 135–145)

## 2017-10-31 LAB — CBC
HCT: 40.4 % (ref 35.0–47.0)
HEMOGLOBIN: 13.5 g/dL (ref 12.0–16.0)
MCH: 28.7 pg (ref 26.0–34.0)
MCHC: 33.5 g/dL (ref 32.0–36.0)
MCV: 85.6 fL (ref 80.0–100.0)
PLATELETS: 222 10*3/uL (ref 150–440)
RBC: 4.71 MIL/uL (ref 3.80–5.20)
RDW: 13.7 % (ref 11.5–14.5)
WBC: 11.9 10*3/uL — ABNORMAL HIGH (ref 3.6–11.0)

## 2017-10-31 NOTE — Consult Note (Signed)
Reason for Consult:Dysphagia Referring Physician: Salary  CC: Abdominal pain  HPI: Sonya [name] is an 84 y.o. female with a history of an acute infarct towards the end of August.  Symptoms included left facial droop, left sided weakness, dysarthria and difficulty swallowing.  Patient admitted at [facility].  Patient discharged on ASA daily and a statin.    Past Medical History:  Diagnosis Date  . CAD (coronary artery disease)   . Carpal tunnel syndrome   . GERD (gastroesophageal reflux disease)   . History of shingles   . Hormone receptor positive breast cancer ([place])   . Hyperlipemia   . Hypertension   . Stroke [facility])     Past Surgical History:  Procedure Laterality Date  . ABDOMINAL HYSTERECTOMY    . APPENDECTOMY    . cornoary angioplasty      Family History  Problem Relation Age of Onset  . Hypertension Son   . Ovarian cancer Mother     Social History:  reports that she has quit smoking. She has never used smokeless tobacco. She reports that she drank alcohol. She reports that she does not use drugs.  Allergies  Allergen Reactions  . Clopidogrel Nausea And Vomiting     reported by [place] 11/24/13  . Omeprazole Other (See Comments)    Unknown reaction - reported by [place] 11/24/13    Medications:  I have reviewed the patient's current medications. Prior to Admission:  Medications Prior to Admission  Medication Sig Dispense Refill Last Dose  . acetaminophen (TYLENOL) 325 MG tablet Take 2 tablets (650 mg total) by mouth every 4 (four) hours as needed for mild pain (or temp > 37.5 C (99.5 F)). 30 tablet 3 PRN at PRN  . aspirin 325 MG tablet Take 1 tablet (325 mg total) by mouth daily. With Food 30 tablet 2 10/30/2017 at 0800  . b complex vitamins tablet Take 1 tablet by mouth daily.   10/30/2017 at 0800  . benazepril (LOTENSIN) 20 MG tablet Take 20 mg by mouth at bedtime.    10/29/2017 at 2000  . bisacodyl (DUCODYL) 5 MG EC tablet  Take 10 mg by mouth daily.   10/30/2017 at 0800  . Cholecalciferol (VITAMIN D3) 1000 units CAPS Take 3,000 Units by mouth at bedtime.    10/29/2017 at 2000  . cloNIDine (CATAPRES) 0.2 MG tablet Take 1 tablet (0.2 mg total) by mouth 2 (two) times daily. 60 tablet 3 10/30/2017 at 0800  . lansoprazole (PREVACID) 15 MG capsule Take 15 mg by mouth at bedtime.    10/29/2017 at 2000  . levothyroxine (SYNTHROID, LEVOTHROID) 25 MCG tablet Take 25 mcg by mouth daily before breakfast.    10/30/2017 at 0800  . magic mouthwash SOLN Take 5 mLs by mouth 3 (three) times daily. Swish and spit   10/30/2017 at Unknown time  . metoprolol tartrate (LOPRESSOR) 25 MG tablet Take 1 tablet (25 mg total) by mouth 2 (two) times daily. 60 tablet 2 10/30/2017 at 0800  . nystatin (MYCOSTATIN) 100000 UNIT/ML suspension Take 5 mLs by mouth 4 (four) times daily.   10/30/2017 at 1100  . ondansetron (ZOFRAN-ODT) 4 MG disintegrating tablet Take 4 mg by mouth every 8 (eight) hours as needed for nausea or vomiting.   PRN at PRN  . senna-docusate (SENOKOT-S) 8.6-50 MG tablet Take 2 tablets by mouth at bedtime. 60 tablet 2 10/29/2017 at 2000  . simvastatin (ZOCOR) 40 MG tablet Take 1 tablet (40 mg total)  by mouth daily at 6 PM. 30 tablet 3 10/29/2017 at 1800  . venlafaxine XR (EFFEXOR-XR) 150 MG 24 hr capsule Take 150 mg by mouth at bedtime.   10/29/2017 at 2000   Scheduled: . benazepril  20 mg Oral QHS  . Chlorhexidine Gluconate Cloth  6 each Topical Q0600  . cloNIDine  0.2 mg Oral BID  . levothyroxine  25 mcg Oral QAC breakfast  . metoprolol tartrate  25 mg Oral BID  . mupirocin ointment  1 application Nasal BID  . polyethylene glycol  17 g Oral Daily  . simvastatin  40 mg Oral q1800  . venlafaxine XR  150 mg Oral QHS    ROS: History obtained from the patient  General ROS: negative for - chills, fatigue, fever, night sweats, weight gain or weight loss Psychological ROS: negative for - behavioral disorder, hallucinations, memory  difficulties, mood swings or suicidal ideation Ophthalmic ROS: negative for - blurry vision, double vision, eye pain or loss of vision ENT ROS: as noted in HPI Allergy and Immunology ROS: negative for - hives or itchy/watery eyes Hematological and Lymphatic ROS: negative for - bleeding problems, bruising or swollen lymph nodes Endocrine ROS: negative for - galactorrhea, hair pattern changes, polydipsia/polyuria or temperature intolerance Respiratory ROS: negative for - cough, hemoptysis, shortness of breath or wheezing Cardiovascular ROS: negative for - chest pain, dyspnea on exertion, edema or irregular heartbeat Gastrointestinal ROS: negative for - abdominal pain, diarrhea, hematemesis, nausea/vomiting or stool incontinence Genito-Urinary ROS: negative for - dysuria, hematuria, incontinence or urinary frequency/urgency Musculoskeletal ROS: negative for - joint swelling or muscular weakness Neurological ROS: as noted in HPI Dermatological ROS: negative for rash and skin lesion changes  Physical Examination: Blood pressure (!) 189/67, pulse 67, temperature 98.1 F (36.7 C), temperature source Oral, resp. rate 16, height 5' (1.524 m), weight 55 kg, SpO2 96 %.  HEENT-  Normocephalic, no lesions, without obvious abnormality.  Normal external eye and conjunctiva.  Normal TM's bilaterally.  Normal auditory canals and external ears. Normal external nose, mucus membranes and septum.  Normal pharynx. Cardiovascular- S1, S2 normal, pulses palpable throughout   Lungs- chest clear, no wheezing, rales, normal symmetric air entry Abdomen- soft, non-tender; bowel sounds normal; no masses,  no organomegaly Extremities- no edema Lymph-no adenopathy palpable Musculoskeletal-no joint tenderness, deformity or swelling Skin-warm and dry, no hyperpigmentation, vitiligo, or suspicious lesions  Neurological Examination   Mental Status: Alert, oriented, thought content appropriate.  Speech fluent but  dysarthric.  Able to follow commands. Cranial Nerves: II: Discs flat bilaterally; Visual fields grossly normal, pupils equal, round, reactive to light and accommodation III,IV, VI: ptosis not present, extra-ocular motions intact bilaterally V,VII: left facial droop, facial light touch sensation normal bilaterally VIII: hearing normal bilaterally IX,X: gag reflex reduced XI: shoulder shrug decreased on the left XII: midline tongue extension Motor: Right : Upper extremity   5/5    Left:     Upper extremity   1/5  Lower extremity   5/5     Lower extremity   2-3/5 Tone and bulk:normal tone throughout; no atrophy noted Sensory: Pinprick and light touch intact throughout, bilaterally Deep Tendon Reflexes: 2+ and symmetric with absent AJ's bilaterally Plantars: Right: downgoing   Left: upgoing Cerebellar: Normal finger-to-nose and normal heel-to-shin testing on the right Gait: not tested due to safety concerns    Laboratory Studies:   Basic Metabolic Panel: Recent Labs  Lab 10/30/17 1159 10/31/17 0508  NA 139 140  K 5.0 4.4  CL  102 110  CO2 26 27  GLUCOSE 112* 87  BUN 45* 37*  CREATININE 1.12* 1.02*  CALCIUM 9.7 9.1    Liver Function Tests: Recent Labs  Lab 10/30/17 1159 10/31/17 0508  AST 27 27  ALT 15 12  ALKPHOS 80 67  BILITOT 0.8 0.8  PROT 7.2 6.1*  ALBUMIN 3.2* 2.7*   Recent Labs  Lab 10/30/17 1159  LIPASE 53*   No results for input(s): AMMONIA in the last 168 hours.  CBC: Recent Labs  Lab 10/30/17 1159 10/31/17 0508  WBC 14.5* 11.9*  NEUTROABS 11.2*  --   HGB 14.4 13.5  HCT 44.0 40.4  MCV 85.9 85.6  PLT 254 222    Cardiac Enzymes: Recent Labs  Lab 10/30/17 1159  TROPONINI <0.03    BNP: Invalid input(s): POCBNP  CBG: No results for input(s): GLUCAP in the last 168 hours.  Microbiology: Results for orders placed or performed during the hospital encounter of 10/30/17  MRSA PCR Screening     Status: Abnormal   Collection Time: 10/30/17   6:37 PM  Result Value Ref Range Status   MRSA by PCR POSITIVE (A) NEGATIVE Final    Comment:        The GeneXpert MRSA Assay (FDA approved for NASAL specimens only), is one component of a comprehensive MRSA colonization surveillance program. It is not intended to diagnose MRSA infection nor to guide or monitor treatment for MRSA infections. RESULT CALLED TO, READ BACK BY AND VERIFIED WITH: MARSHA HATCH @1952  10/30/17 AKT Performed at [facility], [address] [place], [place] [address]     Coagulation Studies: Recent Labs    10/30/17 1256  LABPROT 13.7  INR 1.06    Urinalysis:  Recent Labs  Lab 10/30/17 1132  COLORURINE YELLOW*  LABSPEC 1.018  PHURINE 5.0  GLUCOSEU NEGATIVE  HGBUR NEGATIVE  BILIRUBINUR NEGATIVE  KETONESUR NEGATIVE  PROTEINUR NEGATIVE  NITRITE NEGATIVE  LEUKOCYTESUR NEGATIVE    Lipid Panel:     Component Value Date/Time   CHOL 145 10/13/2017 0217   TRIG 156 (H) 10/13/2017 0217   HDL 38 (L) 10/13/2017 0217   CHOLHDL 3.8 10/13/2017 0217   VLDL 31 10/13/2017 0217   LDLCALC 76 10/13/2017 0217    HgbA1C:  Lab Results  Component Value Date   HGBA1C 5.9 (H) 10/13/2017    Urine Drug Screen:      Component Value Date/Time   LABOPIA NONE DETECTED 10/12/2017 2203   COCAINSCRNUR NONE DETECTED 10/12/2017 2203   LABBENZ NONE DETECTED 10/12/2017 2203   AMPHETMU NONE DETECTED 10/12/2017 2203   THCU NONE DETECTED 10/12/2017 2203   LABBARB NONE DETECTED 10/12/2017 2203    Alcohol Level: No results for input(s): ETH in the last 168 hours.  Other results: EKG: sinus rhythm at 73 bpm.  Imaging: Ct Abdomen Pelvis Wo Contrast  Result Date: 10/30/2017 CLINICAL DATA:  Nausea and vomiting. EXAM: CT ABDOMEN AND PELVIS WITHOUT CONTRAST TECHNIQUE: Multidetector CT imaging of the abdomen and pelvis was performed following the standard protocol without IV contrast. COMPARISON:  None. FINDINGS: Lower chest: Lung bases are essentially clear.  There may be tiny vague nodular densities in right middle lobe which are likely incidental findings. Coronary artery calcifications. Descending thoracic aorta is heavily calcified and ectatic measuring up to 3.3 cm. Hepatobiliary: Intrahepatic and extrahepatic biliary dilatation. Evidence for an obstructing stone in the distal common bile duct region measuring roughly 1 cm. This is best seen on the coronal reformats, sequence 5, image  41. Common bile duct measures roughly 1.2 cm. Mild distention of the gallbladder. Intrahepatic biliary dilatation appears to be most prominent in the left hepatic lobe. Pancreas: No significant inflammation around the pancreas. Spleen: Low-density mildly exophytic structure involving the inferior aspect of the spleen measures 1.9 cm and nonspecific. Adrenals/Urinary Tract: Adrenal glands are within normal limits. Evidence for chronic atrophy in the left kidney with numerous left renal cysts and left hydroureteronephrosis. Left ureter is dilated down to the pelvis. The distal left ureter is not well visualized and may be atretic. High-density material within the left renal collecting system is probably related to chronic stasis. Evidence for a complex cyst involving the right kidney lower pole that measures up to 5.2 cm. Mild dilatation of the right renal pelvis. Right kidney is slightly malrotated. No significant dilatation of the right ureter. Moderate distention of the urinary bladder. Stomach/Bowel: Fat stranding around the rectum is nonspecific. Moderate sized hiatal hernia. No evidence for bowel obstruction. No focal bowel inflammation. Vascular/Lymphatic: The abdominal aorta is heavily calcified. The origin of the SMA is heavily calcified. Evidence for stenosis involving the origin of the celiac trunk. Diffuse atherosclerotic disease in the common iliac arteries. No significant lymph node enlargement in the abdomen or pelvis. Reproductive: Status post hysterectomy. No adnexal  masses. Other: Stranding in the perirectal region but no significant free fluid. Negative for free air. Musculoskeletal: Anterior wedge compression deformity involving the T12 vertebral body of unknown age. There is probably a compression deformity involving the inferior endplate of L1. Disc space narrowing and disease at L2-L3. IMPRESSION: 1. Biliary obstruction. Evidence for choledocholithiasis with a large stone in the distal common bile duct. There is intrahepatic and extrahepatic biliary dilatation. 2. Chronic obstruction in the distal left ureter of unknown etiology. There is severe atrophy in the left kidney with multiple left renal cysts. 3. Indeterminate 1.9 cm low-density structure in the spleen. Probably an incidental finding. 4. Large right renal cyst. 5. Severe atherosclerotic disease involving the aorta and visceral arteries. Suspect significant stenosis involving the SMA and possibly the celiac trunk. 6. Compression fractures at T12 and L1 of unknown age. Electronically Signed   By: Markus Daft M.D.   On: 10/30/2017 13:39   Dg Chest Port 1 View  Result Date: 10/30/2017 CLINICAL DATA:  Difficulty swallowing and vomiting EXAM: PORTABLE CHEST 1 VIEW COMPARISON:  10/12/2017 FINDINGS: The heart size and mediastinal contours are within normal limits. Both lungs are clear. The visualized skeletal structures are unremarkable. IMPRESSION: No active disease. Electronically Signed   By: Inez Catalina M.D.   On: 10/30/2017 11:56     Assessment/Plan: 84 year old female presenting with dysphagia after a stroke in August of this year.  Patient being evaluated by GI for some GI complaints.  They are anticipating the need for an ERCP and are concerned about her ASA at 325mg .  Patient may decrease ASA to 81mg  in preparation for their procedure and return to 325mg  once they deem appropriate from a GI perspective.   Otherwise patient to continue with outpatient management and work up already in progress through  neurology  No further neurologic intervention is recommended at this time.  If further questions arise, please call or page at that time.  Thank you for allowing neurology to participate in the care of this patient.  Alexis Goodell, MD Neurology [phone] 10/31/2017, 1:41 PM

## 2017-10-31 NOTE — Progress Notes (Signed)
Lucilla Lame, MD [facility]   [address] [place], [place] [address] Phone: [phone] Fax : [phone]   Subjective: This patient was admitted with decreased p.o. intake and dysphasia.  The patient was evaluated [facility] and recommended to have a pured diet.  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  The patient and her family report that the dysphasia got significantly worse after the stroke.  The patient was reporting some abdominal pain and had a CT scan of the abdomen that showed choledocholithiasis.  The patient's liver enzymes have remained normal.  The CT scan did show a 1 cm obstructing stone in the distal common bile duct with intrahepatic and extrahepatic ductal dilatation.  The patient had been on a full dose aspirin 325 mg a day for her recent stroke.   Objective: Vital signs in last 24 hours: Vitals:   10/30/17 2044 10/31/17 0442 10/31/17 1012 10/31/17 1014  BP: (!) 181/78 (!) 155/50 (!) 189/67 (!) 189/67  Pulse: 71 64 67 67  Resp: 20 16    Temp: 98.3 F (36.8 C) 98.1 F (36.7 C)    TempSrc: Oral Oral    SpO2: 98% 96%    Weight:      Height:       Weight change:   Intake/Output Summary (Last 24 hours) at 10/31/2017 1110 Last data filed at 10/31/2017 6295 Gross per 24 hour  Intake 1417.66 ml  Output -  Net 1417.66 ml     Exam: Heart:: Regular rate and rhythm, S1S2 present or without murmur or extra heart sounds Lungs: normal and clear to auscultation and percussion Abdomen: soft, nontender, normal bowel sounds   Lab Results: @LABTEST2 @ Micro Results: Recent Results (from the past 240 hour(s))  MRSA PCR Screening     Status: Abnormal   Collection Time: 10/30/17  6:37 PM  Result Value Ref Range Status   MRSA by PCR POSITIVE (A) NEGATIVE Final    Comment:        The GeneXpert MRSA Assay (FDA approved for NASAL specimens only), is one component of a comprehensive  MRSA colonization surveillance program. It is not intended to diagnose MRSA infection nor to guide or monitor treatment for MRSA infections. RESULT CALLED TO, READ BACK BY AND VERIFIED WITH: MARSHA HATCH @1952  10/30/17 AKT Performed at [facility], [address], [place], [place] [address]    Studies/Results: Ct Abdomen Pelvis Wo Contrast  Result Date: 10/30/2017 CLINICAL DATA:  Nausea and vomiting. EXAM: CT ABDOMEN AND PELVIS WITHOUT CONTRAST TECHNIQUE: Multidetector CT imaging of the abdomen and pelvis was performed following the standard protocol without IV contrast. COMPARISON:  None. FINDINGS: Lower chest: Lung bases are essentially clear. There may be tiny vague nodular densities in right middle lobe which are likely incidental findings. Coronary artery calcifications. Descending thoracic aorta is heavily calcified and ectatic measuring up to 3.3 cm. Hepatobiliary: Intrahepatic and extrahepatic biliary dilatation. Evidence for an obstructing stone in the distal common bile duct region measuring roughly 1 cm. This is best seen on the coronal reformats, sequence 5, image 41. Common bile duct measures roughly 1.2 cm. Mild distention of the gallbladder. Intrahepatic biliary dilatation appears to be most prominent in the left hepatic lobe. Pancreas: No significant inflammation around the pancreas. Spleen: Low-density mildly exophytic structure involving the inferior aspect of the spleen measures 1.9 cm and nonspecific. Adrenals/Urinary Tract: Adrenal glands are within normal limits.  Evidence for chronic atrophy in the left kidney with numerous left renal cysts and left hydroureteronephrosis. Left ureter is dilated down to the pelvis. The distal left ureter is not well visualized and may be atretic. High-density material within the left renal collecting system is probably related to chronic stasis. Evidence for a complex cyst involving the right kidney lower pole that measures up to 5.2 cm.  Mild dilatation of the right renal pelvis. Right kidney is slightly malrotated. No significant dilatation of the right ureter. Moderate distention of the urinary bladder. Stomach/Bowel: Fat stranding around the rectum is nonspecific. Moderate sized hiatal hernia. No evidence for bowel obstruction. No focal bowel inflammation. Vascular/Lymphatic: The abdominal aorta is heavily calcified. The origin of the SMA is heavily calcified. Evidence for stenosis involving the origin of the celiac trunk. Diffuse atherosclerotic disease in the common iliac arteries. No significant lymph node enlargement in the abdomen or pelvis. Reproductive: Status post hysterectomy. No adnexal masses. Other: Stranding in the perirectal region but no significant free fluid. Negative for free air. Musculoskeletal: Anterior wedge compression deformity involving the T12 vertebral body of unknown age. There is probably a compression deformity involving the inferior endplate of L1. Disc space narrowing and disease at L2-L3. IMPRESSION: 1. Biliary obstruction. Evidence for choledocholithiasis with a large stone in the distal common bile duct. There is intrahepatic and extrahepatic biliary dilatation. 2. Chronic obstruction in the distal left ureter of unknown etiology. There is severe atrophy in the left kidney with multiple left renal cysts. 3. Indeterminate 1.9 cm low-density structure in the spleen. Probably an incidental finding. 4. Large right renal cyst. 5. Severe atherosclerotic disease involving the aorta and visceral arteries. Suspect significant stenosis involving the SMA and possibly the celiac trunk. 6. Compression fractures at T12 and L1 of unknown age. Electronically Signed   By: Markus Daft M.D.   On: 10/30/2017 13:39   Dg Chest Port 1 View  Result Date: 10/30/2017 CLINICAL DATA:  Difficulty swallowing and vomiting EXAM: PORTABLE CHEST 1 VIEW COMPARISON:  10/12/2017 FINDINGS: The heart size and mediastinal contours are within normal  limits. Both lungs are clear. The visualized skeletal structures are unremarkable. IMPRESSION: No active disease. Electronically Signed   By: Inez Catalina M.D.   On: 10/30/2017 11:56   Medications: I have reviewed the patient's current medications. Scheduled Meds: . benazepril  20 mg Oral QHS  . Chlorhexidine Gluconate Cloth  6 each Topical Q0600  . cloNIDine  0.2 mg Oral BID  . levothyroxine  25 mcg Oral QAC breakfast  . metoprolol tartrate  25 mg Oral BID  . mupirocin ointment  1 application Nasal BID  . polyethylene glycol  17 g Oral Daily  . simvastatin  40 mg Oral q1800  . venlafaxine XR  150 mg Oral QHS   Continuous Infusions: . sodium chloride 75 mL/hr at 10/31/17 0644   PRN Meds:.acetaminophen **OR** acetaminophen, ondansetron **OR** ondansetron (ZOFRAN) IV, oxyCODONE   Assessment: Active Problems:   Abdominal pain Choledocholithiasis Dysphasia   Plan: This patient has dysphasia with choledocholithiasis and is awaiting evaluation from speech pathology.  The patient has normal liver enzymes and denies any abdominal pain.  Depending on what the speech pathologist determines other possible causes of the patient's dysphasia will determine whether we set her up for an EGD prior to doing an ERCP.  The patient would preferably need to be off of aspirin 325 mg for at least 5 to 7 days due to the risk of bleeding.  Since the  LFTs are normal I believe we have time to make a decision.  I will follow the patient along with you.   LOS: 1 day   Lucilla Lame 10/31/2017, 11:10 AM

## 2017-10-31 NOTE — Evaluation (Signed)
Clinical/Bedside Swallow Evaluation Patient Details  Name: Sonya [name] MRN: [id number] Date of Birth: August 09, 1932  Today's Date: 10/31/2017 Time: SLP Start Time (ACUTE ONLY): 1500 SLP Stop Time (ACUTE ONLY): 1530 SLP Time Calculation (min) (ACUTE ONLY): 30 min  Past Medical History:  Past Medical History:  Diagnosis Date  . CAD (coronary artery disease)   . Carpal tunnel syndrome   . GERD (gastroesophageal reflux disease)   . History of shingles   . Hormone receptor positive breast cancer (HCC)   . Hyperlipemia   . Hypertension   . Stroke [facility])    Past Surgical History:  Past Surgical History:  Procedure Laterality Date  . ABDOMINAL HYSTERECTOMY    . APPENDECTOMY    . cornoary angioplasty     HPI:  Per admitting H&P: Sonya [name]  is a 84 y.o. female with a known history of coronary artery disease, GERD, hypertension, hyperlipidemia who had a stroke 2 weeks ago.  She had a left upper extremity left lower extremity weakness and facial droop.  Patient was in rehab when she started having abdominal pain nausea vomiting for the past few days.  Patient also has had poor appetite.  In the ER CT scan of the abdomen shows  Choldeocolithiasis.  Patient also was noted to have severe abdominal arteries stenosis as well.   Assessment / Plan / Recommendation Clinical Impression  Patient is s/p CVA 2 weeks ago with dysphagia and dysarthria, she was receiving SLP tx at SNF for same. Patient reported she was on a puree diet with thin liquids, tolerating it well with no s/s aspiration. Patient noted with known esophageal stricture in past, family reports her esophageal problems have worsened since recent CVA. Today, patient presents with s/s consistent with mod oral phase dysphagia. No s/s aspiration or laryngeal penetration observed with any consistency tested; however patient at increased risk of aspiration due to oral phase deficits and possible pharyngeal phase dysphagia due to observation of  effortful swallow (slightly tucking her chin and furrowing her brow) with every PO offered, which could indicate decreased pharyngeal pressure and possible pharyngeal residue.  Patient noted with mod left facial droop,  decreased facial and lingual sensation, ROM, and strength. Mild oral transit delay and mild to mod oral prep difficulties noted with puree and thin liquids. Min oral residue observed with puree which easily cleared with sips of nectar thick liquid. Mild L anterior spillage with thin liquids. Patient appeared to tolerate nectar thick liquids better with somewhat better oral control and timely initiation of swallow. Vocal quality remained clear throughout evaluation.  Recommend Dysphagia I (Puree) diet with nectar thick liquids with strict aspiration precautions. Recommend give meds crushed in puree, alternate tsp puree with single sips nectar thick liquid, and take small bites and sips. SLP to f/u with toleration of diet and offer trials of upgraded consistency as appropriate.   SLP Visit Diagnosis: Dysphagia, unspecified (R13.10)    Aspiration Risk  Moderate aspiration risk    Diet Recommendation Dysphagia 1 (Puree);Nectar-thick liquid   Liquid Administration via: Cup Medication Administration: Crushed with puree Supervision: Staff to assist with self feeding Compensations: Minimize environmental distractions;Slow rate;Small sips/bites Postural Changes: Seated upright at 90 degrees;Remain upright for at least 30 minutes after po intake    Other  Recommendations Recommended Consults: Consider esophageal assessment Oral Care Recommendations: Oral care BID   Follow up Recommendations Inpatient Rehab;Skilled Nursing facility      Frequency and Duration min 4x/week  2 weeks  Prognosis Prognosis for Safe Diet Advancement: Good Barriers to Reach Goals: Severity of deficits      Swallow Study   General Date of Onset: 10/31/17 HPI: Per admitting H&P: Sonya [name]  is  a 84 y.o. female with a known history of coronary artery disease, GERD, hypertension, hyperlipidemia who had a stroke 2 weeks ago.  She had a left upper extremity left lower extremity weakness and facial droop.  Patient was in rehab when she started having abdominal pain nausea vomiting for the past few days.  Patient also has had poor appetite.  In the ER CT scan of the abdomen shows  Choldeocolithiasis.  Patient also was noted to have severe abdominal arteries stenosis as well. Type of Study: Bedside Swallow Evaluation Diet Prior to this Study: Dysphagia 2 (chopped);Nectar-thick liquids Temperature Spikes Noted: No History of Recent Intubation: No Behavior/Cognition: Alert;Cooperative;Pleasant mood Oral Cavity Assessment: Dry Oral Cavity - Dentition: Poor condition;Missing dentition(poor lower dentition) Self-Feeding Abilities: Able to feed self;Needs assist Patient Positioning: Upright in bed Baseline Vocal Quality: Low vocal intensity Volitional Cough: Weak Volitional Swallow: Able to elicit    Oral/Motor/Sensory Function Overall Oral Motor/Sensory Function: Moderate impairment Facial ROM: Reduced left Facial Symmetry: Abnormal symmetry left Facial Strength: Reduced right;Reduced left Facial Sensation: Reduced left Lingual ROM: Reduced right;Reduced left Lingual Symmetry: Within Functional Limits Lingual Strength: Reduced Velum: Within Functional Limits Mandible: Impaired   Ice Chips Ice chips: Within functional limits Presentation: Spoon   Thin Liquid Thin Liquid: Impaired Presentation: Cup;Self Fed;Spoon Oral Phase Impairments: Reduced labial seal;Reduced lingual movement/coordination Oral Phase Functional Implications: Left anterior spillage    Nectar Thick Nectar Thick Liquid: Within functional limits Presentation: Cup   Honey Thick     Puree Puree: Impaired Presentation: Spoon Oral Phase Impairments: Reduced lingual movement/coordination Oral Phase Functional  Implications: Prolonged oral transit   Solid     Solid: Not tested      Shepherd Finnan, MA, CCC-SLP 10/31/2017,3:38 PM

## 2017-10-31 NOTE — Progress Notes (Signed)
[employer] -  at [facility]   PATIENT NAME: Sonya [name]    MR#:  [id number]  DATE OF BIRTH:  30-Jul-1932  SUBJECTIVE:  CHIEF COMPLAINT:   Chief Complaint  Patient presents with  . Emesis  . Abdominal Pain  Patient feels better, no abdominal pain, husband and patient's son at the bedside, await neurology recommendations, gastroenterology input appreciated  REVIEW OF SYSTEMS:  CONSTITUTIONAL: No fever, fatigue or weakness.  EYES: No blurred or double vision.  EARS, NOSE, AND THROAT: No tinnitus or ear pain.  RESPIRATORY: No cough, shortness of breath, wheezing or hemoptysis.  CARDIOVASCULAR: No chest pain, orthopnea, edema.  GASTROINTESTINAL: No nausea, vomiting, diarrhea or abdominal pain.  GENITOURINARY: No dysuria, hematuria.  ENDOCRINE: No polyuria, nocturia,  HEMATOLOGY: No anemia, easy bruising or bleeding SKIN: No rash or lesion. MUSCULOSKELETAL: No joint pain or arthritis.   NEUROLOGIC: No tingling, numbness, weakness.  PSYCHIATRY: No anxiety or depression.   ROS  DRUG ALLERGIES:   Allergies  Allergen Reactions  . Clopidogrel Nausea And Vomiting     reported by [facility] 11/24/13  . Omeprazole Other (See Comments)    Unknown reaction - reported by [facility] 11/24/13    VITALS:  Blood pressure (!) 189/67, pulse 67, temperature 98.1 F (36.7 C), temperature source Oral, resp. rate 16, height 5' (1.524 m), weight 55 kg, SpO2 96 %.  PHYSICAL EXAMINATION:  GENERAL:  84 y.o.-year-old patient lying in the bed with no acute distress.  EYES: Pupils equal, round, reactive to light and accommodation. No scleral icterus. Extraocular muscles intact.  HEENT: Head atraumatic, normocephalic. Oropharynx and nasopharynx clear.  NECK:  Supple, no jugular venous distention. No thyroid enlargement, no tenderness.  LUNGS: Normal breath sounds bilaterally, no wheezing, rales,rhonchi or crepitation. No use of accessory  muscles of respiration.  CARDIOVASCULAR: S1, S2 normal. No murmurs, rubs, or gallops.  ABDOMEN: Soft, nontender, nondistended. Bowel sounds present. No organomegaly or mass.  EXTREMITIES: No pedal edema, cyanosis, or clubbing.  NEUROLOGIC: Cranial nerves II through XII are intact. Muscle strength 5/5 in all extremities. Sensation intact. Gait not checked.  PSYCHIATRIC: The patient is alert and oriented x 3.  SKIN: No obvious rash, lesion, or ulcer.   Physical Exam LABORATORY PANEL:   CBC Recent Labs  Lab 10/31/17 0508  WBC 11.9*  HGB 13.5  HCT 40.4  PLT 222   ------------------------------------------------------------------------------------------------------------------  Chemistries  Recent Labs  Lab 10/31/17 0508  NA 140  K 4.4  CL 110  CO2 27  GLUCOSE 87  BUN 37*  CREATININE 1.02*  CALCIUM 9.1  AST 27  ALT 12  ALKPHOS 67  BILITOT 0.8   ------------------------------------------------------------------------------------------------------------------  Cardiac Enzymes Recent Labs  Lab 10/30/17 1159  TROPONINI <0.03   ------------------------------------------------------------------------------------------------------------------  RADIOLOGY:  Ct Abdomen Pelvis Wo Contrast  Result Date: 10/30/2017 CLINICAL DATA:  Nausea and vomiting. EXAM: CT ABDOMEN AND PELVIS WITHOUT CONTRAST TECHNIQUE: Multidetector CT imaging of the abdomen and pelvis was performed following the standard protocol without IV contrast. COMPARISON:  None. FINDINGS: Lower chest: Lung bases are essentially clear. There may be tiny vague nodular densities in right middle lobe which are likely incidental findings. Coronary artery calcifications. Descending thoracic aorta is heavily calcified and ectatic measuring up to 3.3 cm. Hepatobiliary: Intrahepatic and extrahepatic biliary dilatation. Evidence for an obstructing stone in the distal common bile duct region measuring roughly 1 cm. This is best  seen on the coronal reformats, sequence 5, image 41. Common bile  duct measures roughly 1.2 cm. Mild distention of the gallbladder. Intrahepatic biliary dilatation appears to be most prominent in the left hepatic lobe. Pancreas: No significant inflammation around the pancreas. Spleen: Low-density mildly exophytic structure involving the inferior aspect of the spleen measures 1.9 cm and nonspecific. Adrenals/Urinary Tract: Adrenal glands are within normal limits. Evidence for chronic atrophy in the left kidney with numerous left renal cysts and left hydroureteronephrosis. Left ureter is dilated down to the pelvis. The distal left ureter is not well visualized and may be atretic. High-density material within the left renal collecting system is probably related to chronic stasis. Evidence for a complex cyst involving the right kidney lower pole that measures up to 5.2 cm. Mild dilatation of the right renal pelvis. Right kidney is slightly malrotated. No significant dilatation of the right ureter. Moderate distention of the urinary bladder. Stomach/Bowel: Fat stranding around the rectum is nonspecific. Moderate sized hiatal hernia. No evidence for bowel obstruction. No focal bowel inflammation. Vascular/Lymphatic: The abdominal aorta is heavily calcified. The origin of the SMA is heavily calcified. Evidence for stenosis involving the origin of the celiac trunk. Diffuse atherosclerotic disease in the common iliac arteries. No significant lymph node enlargement in the abdomen or pelvis. Reproductive: Status post hysterectomy. No adnexal masses. Other: Stranding in the perirectal region but no significant free fluid. Negative for free air. Musculoskeletal: Anterior wedge compression deformity involving the T12 vertebral body of unknown age. There is probably a compression deformity involving the inferior endplate of L1. Disc space narrowing and disease at L2-L3. IMPRESSION: 1. Biliary obstruction. Evidence for  choledocholithiasis with a large stone in the distal common bile duct. There is intrahepatic and extrahepatic biliary dilatation. 2. Chronic obstruction in the distal left ureter of unknown etiology. There is severe atrophy in the left kidney with multiple left renal cysts. 3. Indeterminate 1.9 cm low-density structure in the spleen. Probably an incidental finding. 4. Large right renal cyst. 5. Severe atherosclerotic disease involving the aorta and visceral arteries. Suspect significant stenosis involving the SMA and possibly the celiac trunk. 6. Compression fractures at T12 and L1 of unknown age. Electronically Signed   By: Richarda Overlie M.D.   On: 10/30/2017 13:39   Dg Chest Port 1 View  Result Date: 10/30/2017 CLINICAL DATA:  Difficulty swallowing and vomiting EXAM: PORTABLE CHEST 1 VIEW COMPARISON:  10/12/2017 FINDINGS: The heart size and mediastinal contours are within normal limits. Both lungs are clear. The visualized skeletal structures are unremarkable. IMPRESSION: No active disease. Electronically Signed   By: Alcide Clever M.D.   On: 10/30/2017 11:56    ASSESSMENT AND PLAN:  Patient is 84 year old presenting with abdominal pain  *Acute abdominal pain Resolved Suspected due to acute constipation-resolved with bowel movement Was initially thought to be due to choledocholithiasis, gastroenterology input appreciated-in discussion with Dr. Servando Snare -concern given recent CVA/wants neurology consultation with reduction of aspirin so the ERCP may be done-can be done as an outpatient Vascular surgery input appreciated-recommended outpatient follow-up with abdominal ultrasound for further evaluation of atherosclerotic disease Advance diet as tolerated  *Severe peripheral vascular disease Vascular surgery input appreciated-for outpatient follow-up for continued surveillance/no acute intervention recommended CT noted for severe atherosclerotic disease involving the aorta and visceral arteries/Suspect  significant stenosis involving the SMA and possibly the celiac trunk. In reviewing her recent hospitalization she has severe left scope subclavian stenosis as well as left carotid artery 50 to 70% stenosis  *Essential hypertension Controlled on clonidine, Lotensin, Lopressor  *Hypothyroidism  continue Synthroid  *  Hyperlipidemia continue Zocor  *Depression continue Effexor  *Acute recent CVA Increased nursing care PRN, aspiration/fall/skin care precautions while in house, speech therapy to see  *Chronic dysphasia Secondary to recent CVA Plan of care as stated above  Disposition to inpatient rehab on tomorrow barring any complications   All the records are reviewed and case discussed with Care Management/Social Workerr. Management plans discussed with the patient, family and they are in agreement.  CODE STATUS: dnr TOTAL TIME TAKING CARE OF THIS PATIENT: 40 minutes.     POSSIBLE D/C IN 1-2 DAYS, DEPENDING ON CLINICAL CONDITION.   Evelena Asa Jie Stickels M.D on 10/31/2017   Between 7am to 6pm - Pager - [phone]  After 6pm go to [email or web address] - password [email or web address]  [facility]  Office  [phone]  CC: Primary care physician; Gracelyn Nurse, MD  Note: This dictation was prepared with Dragon dictation along with smaller phrase technology. Any transcriptional errors that result from this process are unintentional.

## 2017-11-01 LAB — CBC
HEMATOCRIT: 37.5 % (ref 35.0–47.0)
Hemoglobin: 12.6 g/dL (ref 12.0–16.0)
MCH: 28.8 pg (ref 26.0–34.0)
MCHC: 33.5 g/dL (ref 32.0–36.0)
MCV: 86.1 fL (ref 80.0–100.0)
Platelets: 210 10*3/uL (ref 150–440)
RBC: 4.35 MIL/uL (ref 3.80–5.20)
RDW: 13.7 % (ref 11.5–14.5)
WBC: 9.8 10*3/uL (ref 3.6–11.0)

## 2017-11-01 MED ORDER — ADULT MULTIVITAMIN W/MINERALS CH
1.0000 | ORAL_TABLET | Freq: Every day | ORAL | Status: DC
  Administered 2017-11-02 – 2017-11-03 (×2): 1 via ORAL
  Filled 2017-11-01 (×2): qty 1

## 2017-11-01 NOTE — Progress Notes (Signed)
Lucilla Lame, MD [facility]   [address], [place], [place] [address] Phone: [phone] Fax : [phone]   Subjective: This patient was seen by speech therapy yesterday who diagnosed the patient with oral dysphasia and recommended esophageal luminal evaluation.  The patient has been on a full dose aspirin and the recommendation by neurology was to decrease it to 81 mg until after the patient has had her ERCP and any treatment of possible esophageal strictures.   Objective: Vital signs in last 24 hours: Vitals:   10/31/17 1014 10/31/17 1424 10/31/17 2022 11/01/17 0517  BP: (!) 189/67 (!) 149/47 (!) 174/59 (!) 177/78  Pulse: 67 (!) 51 64 60  Resp:  16 18 16   Temp:  97.8 F (36.6 C) 98.3 F (36.8 C) 98.1 F (36.7 C)  TempSrc:  Oral Oral Oral  SpO2:  95% 95% 96%  Weight:      Height:       Weight change:   Intake/Output Summary (Last 24 hours) at 11/01/2017 0854 Last data filed at 11/01/2017 0600 Gross per 24 hour  Intake -  Output 800 ml  Net -800 ml     Exam: Heart:: Regular rate and rhythm, S1S2 present or without murmur or extra heart sounds Lungs: normal and clear to auscultation and percussion Abdomen: soft, nontender, normal bowel sounds   Lab Results: @LABTEST2 @ Micro Results: Recent Results (from the past 240 hour(s))  Urine culture     Status: None   Collection Time: 10/30/17 11:32 AM  Result Value Ref Range Status   Specimen Description   Final    URINE, RANDOM Performed at [facility], [address], [place], [place] [address]    Special Requests   Final    NONE Performed at [facility], [address] [place], [place] [address]    Culture   Final    NO GROWTH Performed at [facility], [place] [address] [place], [place] [address]    Report Status 10/31/2017 FINAL  Final  MRSA PCR Screening     Status: Abnormal   Collection Time: 10/30/17  6:37 PM  Result Value Ref Range Status   MRSA by PCR POSITIVE (A)  NEGATIVE Final    Comment:        The GeneXpert MRSA Assay (FDA approved for NASAL specimens only), is one component of a comprehensive MRSA colonization surveillance program. It is not intended to diagnose MRSA infection nor to guide or monitor treatment for MRSA infections. RESULT CALLED TO, READ BACK BY AND VERIFIED WITH: MARSHA HATCH @1952  10/30/17 AKT Performed at [facility], [address], [place], [place] [address]    Studies/Results: Ct Abdomen Pelvis Wo Contrast  Result Date: 10/30/2017 CLINICAL DATA:  Nausea and vomiting. EXAM: CT ABDOMEN AND PELVIS WITHOUT CONTRAST TECHNIQUE: Multidetector CT imaging of the abdomen and pelvis was performed following the standard protocol without IV contrast. COMPARISON:  None. FINDINGS: Lower chest: Lung bases are essentially clear. There may be tiny vague nodular densities in right middle lobe which are likely incidental findings. Coronary artery calcifications. Descending thoracic aorta is heavily calcified and ectatic measuring up to 3.3 cm. Hepatobiliary: Intrahepatic and extrahepatic biliary dilatation. Evidence for an obstructing stone in the distal common bile duct region measuring roughly 1 cm. This is best seen on the coronal reformats, sequence 5, image 41. Common bile duct measures roughly 1.2 cm. Mild distention of the gallbladder. Intrahepatic biliary dilatation appears to be most prominent in the left hepatic  lobe. Pancreas: No significant inflammation around the pancreas. Spleen: Low-density mildly exophytic structure involving the inferior aspect of the spleen measures 1.9 cm and nonspecific. Adrenals/Urinary Tract: Adrenal glands are within normal limits. Evidence for chronic atrophy in the left kidney with numerous left renal cysts and left hydroureteronephrosis. Left ureter is dilated down to the pelvis. The distal left ureter is not well visualized and may be atretic. High-density material within the left renal collecting  system is probably related to chronic stasis. Evidence for a complex cyst involving the right kidney lower pole that measures up to 5.2 cm. Mild dilatation of the right renal pelvis. Right kidney is slightly malrotated. No significant dilatation of the right ureter. Moderate distention of the urinary bladder. Stomach/Bowel: Fat stranding around the rectum is nonspecific. Moderate sized hiatal hernia. No evidence for bowel obstruction. No focal bowel inflammation. Vascular/Lymphatic: The abdominal aorta is heavily calcified. The origin of the SMA is heavily calcified. Evidence for stenosis involving the origin of the celiac trunk. Diffuse atherosclerotic disease in the common iliac arteries. No significant lymph node enlargement in the abdomen or pelvis. Reproductive: Status post hysterectomy. No adnexal masses. Other: Stranding in the perirectal region but no significant free fluid. Negative for free air. Musculoskeletal: Anterior wedge compression deformity involving the T12 vertebral body of unknown age. There is probably a compression deformity involving the inferior endplate of L1. Disc space narrowing and disease at L2-L3. IMPRESSION: 1. Biliary obstruction. Evidence for choledocholithiasis with a large stone in the distal common bile duct. There is intrahepatic and extrahepatic biliary dilatation. 2. Chronic obstruction in the distal left ureter of unknown etiology. There is severe atrophy in the left kidney with multiple left renal cysts. 3. Indeterminate 1.9 cm low-density structure in the spleen. Probably an incidental finding. 4. Large right renal cyst. 5. Severe atherosclerotic disease involving the aorta and visceral arteries. Suspect significant stenosis involving the SMA and possibly the celiac trunk. 6. Compression fractures at T12 and L1 of unknown age. Electronically Signed   By: Markus Daft M.D.   On: 10/30/2017 13:39   Dg Chest Port 1 View  Result Date: 10/30/2017 CLINICAL DATA:  Difficulty  swallowing and vomiting EXAM: PORTABLE CHEST 1 VIEW COMPARISON:  10/12/2017 FINDINGS: The heart size and mediastinal contours are within normal limits. Both lungs are clear. The visualized skeletal structures are unremarkable. IMPRESSION: No active disease. Electronically Signed   By: Inez Catalina M.D.   On: 10/30/2017 11:56   Medications: I have reviewed the patient's current medications. Scheduled Meds: . benazepril  20 mg Oral QHS  . Chlorhexidine Gluconate Cloth  6 each Topical Q0600  . cloNIDine  0.2 mg Oral BID  . levothyroxine  25 mcg Oral QAC breakfast  . metoprolol tartrate  25 mg Oral BID  . mupirocin ointment  1 application Nasal BID  . polyethylene glycol  17 g Oral Daily  . simvastatin  40 mg Oral q1800  . venlafaxine XR  150 mg Oral QHS   Continuous Infusions: . sodium chloride 50 mL/hr at 11/01/17 0435   PRN Meds:.acetaminophen **OR** acetaminophen, ondansetron **OR** ondansetron (ZOFRAN) IV, oxyCODONE   Assessment: Active Problems:   Abdominal pain   Choledocholithiasis   Dysphasia    Plan: This patient has dysphasia with common bile duct stone and normal liver enzymes.  The patient will need evaluation for her esophagus prior to having the ERCP.  I would think that doing a barium swallow so that the patient would not have to undergo 2  separate procedures such as a EGD and ERCP.  There is any luminal abnormalities than the could be addressed at the time of the ERCP.  The patient will be off of high-dose aspirin and other 4 to 5 days can undergo her ERCP and EGD if the barium swallow is abnormal.   LOS: 2 days   Lucilla Lame 11/01/2017, 8:54 AM

## 2017-11-01 NOTE — Progress Notes (Signed)
[facility] NAME: Sonya [name]    MR#:  [id number]  DATE OF BIRTH:  [birth year]  SUBJECTIVE:  CHIEF COMPLAINT:   Chief Complaint  Patient presents with  . Emesis  . Abdominal Pain  Patient without complaint, family at the bedside, case discussed with gastroenterology, discussed with case management-for skilled nursing facility placement on tomorrow  REVIEW OF SYSTEMS:  CONSTITUTIONAL: No fever, fatigue or weakness.  EYES: No blurred or double vision.  EARS, NOSE, AND THROAT: No tinnitus or ear pain.  RESPIRATORY: No cough, shortness of breath, wheezing or hemoptysis.  CARDIOVASCULAR: No chest pain, orthopnea, edema.  GASTROINTESTINAL: No nausea, vomiting, diarrhea or abdominal pain.  GENITOURINARY: No dysuria, hematuria.  ENDOCRINE: No polyuria, nocturia,  HEMATOLOGY: No anemia, easy bruising or bleeding SKIN: No rash or lesion. MUSCULOSKELETAL: No joint pain or arthritis.   NEUROLOGIC: No tingling, numbness, weakness.  PSYCHIATRY: No anxiety or depression.   ROS  DRUG ALLERGIES:   Allergies  Allergen Reactions  . Clopidogrel Nausea And Vomiting     reported by [facility] 11/24/13  . Omeprazole Other (See Comments)    Unknown reaction - reported by [facility] 11/24/13    VITALS:  Blood pressure (!) 181/84, pulse 61, temperature 98.1 F (36.7 C), temperature source Oral, resp. rate 16, height 5' (1.524 m), weight 55 kg, SpO2 96 %.  PHYSICAL EXAMINATION:  GENERAL:  84 y.o.-year-old patient lying in the bed with no acute distress.  EYES: Pupils equal, round, reactive to light and accommodation. No scleral icterus. Extraocular muscles intact.  HEENT: Head atraumatic, normocephalic. Oropharynx and nasopharynx clear.  NECK:  Supple, no jugular venous distention. No thyroid enlargement, no tenderness.  LUNGS: Normal breath sounds bilaterally, no wheezing, rales,rhonchi or crepitation.  No use of accessory muscles of respiration.  CARDIOVASCULAR: S1, S2 normal. No murmurs, rubs, or gallops.  ABDOMEN: Soft, nontender, nondistended. Bowel sounds present. No organomegaly or mass.  EXTREMITIES: No pedal edema, cyanosis, or clubbing.  NEUROLOGIC: Cranial nerves II through XII are intact. Muscle strength 5/5 in all extremities. Sensation intact. Gait not checked.  PSYCHIATRIC: The patient is alert and oriented x 3.  SKIN: No obvious rash, lesion, or ulcer.   Physical Exam LABORATORY PANEL:   CBC Recent Labs  Lab 11/01/17 0458  WBC 9.8  HGB 12.6  HCT 37.5  PLT 210   ------------------------------------------------------------------------------------------------------------------  Chemistries  Recent Labs  Lab 10/31/17 0508  NA 140  K 4.4  CL 110  CO2 27  GLUCOSE 87  BUN 37*  CREATININE 1.02*  CALCIUM 9.1  AST 27  ALT 12  ALKPHOS 67  BILITOT 0.8   ------------------------------------------------------------------------------------------------------------------  Cardiac Enzymes Recent Labs  Lab 10/30/17 1159  TROPONINI <0.03   ------------------------------------------------------------------------------------------------------------------  RADIOLOGY:  Ct Abdomen Pelvis Wo Contrast  Result Date: 10/30/2017 CLINICAL DATA:  Nausea and vomiting. EXAM: CT ABDOMEN AND PELVIS WITHOUT CONTRAST TECHNIQUE: Multidetector CT imaging of the abdomen and pelvis was performed following the standard protocol without IV contrast. COMPARISON:  None. FINDINGS: Lower chest: Lung bases are essentially clear. There may be tiny vague nodular densities in right middle lobe which are likely incidental findings. Coronary artery calcifications. Descending thoracic aorta is heavily calcified and ectatic measuring up to 3.3 cm. Hepatobiliary: Intrahepatic and extrahepatic biliary dilatation. Evidence for an obstructing stone in the distal common bile duct region measuring roughly 1  cm. This is best seen on the coronal reformats, sequence 5, image 41.  Common bile duct measures roughly 1.2 cm. Mild distention of the gallbladder. Intrahepatic biliary dilatation appears to be most prominent in the left hepatic lobe. Pancreas: No significant inflammation around the pancreas. Spleen: Low-density mildly exophytic structure involving the inferior aspect of the spleen measures 1.9 cm and nonspecific. Adrenals/Urinary Tract: Adrenal glands are within normal limits. Evidence for chronic atrophy in the left kidney with numerous left renal cysts and left hydroureteronephrosis. Left ureter is dilated down to the pelvis. The distal left ureter is not well visualized and may be atretic. High-density material within the left renal collecting system is probably related to chronic stasis. Evidence for a complex cyst involving the right kidney lower pole that measures up to 5.2 cm. Mild dilatation of the right renal pelvis. Right kidney is slightly malrotated. No significant dilatation of the right ureter. Moderate distention of the urinary bladder. Stomach/Bowel: Fat stranding around the rectum is nonspecific. Moderate sized hiatal hernia. No evidence for bowel obstruction. No focal bowel inflammation. Vascular/Lymphatic: The abdominal aorta is heavily calcified. The origin of the SMA is heavily calcified. Evidence for stenosis involving the origin of the celiac trunk. Diffuse atherosclerotic disease in the common iliac arteries. No significant lymph node enlargement in the abdomen or pelvis. Reproductive: Status post hysterectomy. No adnexal masses. Other: Stranding in the perirectal region but no significant free fluid. Negative for free air. Musculoskeletal: Anterior wedge compression deformity involving the T12 vertebral body of unknown age. There is probably a compression deformity involving the inferior endplate of L1. Disc space narrowing and disease at L2-L3. IMPRESSION: 1. Biliary obstruction. Evidence  for choledocholithiasis with a large stone in the distal common bile duct. There is intrahepatic and extrahepatic biliary dilatation. 2. Chronic obstruction in the distal left ureter of unknown etiology. There is severe atrophy in the left kidney with multiple left renal cysts. 3. Indeterminate 1.9 cm low-density structure in the spleen. Probably an incidental finding. 4. Large right renal cyst. 5. Severe atherosclerotic disease involving the aorta and visceral arteries. Suspect significant stenosis involving the SMA and possibly the celiac trunk. 6. Compression fractures at T12 and L1 of unknown age. Electronically Signed   By: Markus Daft M.D.   On: 10/30/2017 13:39   Dg Chest Port 1 View  Result Date: 10/30/2017 CLINICAL DATA:  Difficulty swallowing and vomiting EXAM: PORTABLE CHEST 1 VIEW COMPARISON:  10/12/2017 FINDINGS: The heart size and mediastinal contours are within normal limits. Both lungs are clear. The visualized skeletal structures are unremarkable. IMPRESSION: No active disease. Electronically Signed   By: Inez Catalina M.D.   On: 10/30/2017 11:56    ASSESSMENT AND PLAN:  Patient is 84 year old presenting with abdominal pain  *Acute abdominal pain Resolved Suspected due to acute constipation-resolved with bowel movement Was initially thought to be due to choledocholithiasis, gastroenterology input appreciated-in discussion with Dr. Allen Norris -concern given recent CVA -in discussion with neurology-aspirin was reduced to 81 mg daily, for EGD/ERCP as an outpatient in 4 to 5 days  Vascular surgery input appreciated-recommended outpatient follow-up with abdominal ultrasound for further evaluation of atherosclerotic disease Diet as tolerated  *Severe peripheral vascular disease Vascular surgery input appreciated-for outpatient follow-up for continued surveillance/no acute intervention recommended CT noted for severe atherosclerotic disease involving the aorta and visceral arteries/Suspect  significant stenosis involving the SMA and possibly the celiac trunk. In reviewing her recent hospitalization she has severe left scope subclavian stenosis as well as left carotid artery 50 to 70% stenosis  *Essential hypertension Controlled on clonidine, Lotensin, Lopressor  *  Hypothyroidism  continue Synthroid  *Hyperlipidemia continue Zocor  *Depression continue Effexor  *Acute recent CVA Continue increased nursing care PRN, aspiration/fall/skin care precautions while in house, speech therapy input appreciated-dysphagia 1 diet with nectar thickened liquids   *Chronic dysphasia Secondary to recent CVA Continue dysphagia 1 diet with nectar thickened liquids  Disposition to skilled nursing facility on tomorrow barring any complications   All the records are reviewed and case discussed with Care Management/Social Workerr. Management plans discussed with the patient, family and they are in agreement.  CODE STATUS: dnr  TOTAL TIME TAKING CARE OF THIS PATIENT: 40 minutes.     POSSIBLE D/C IN 1 DAYS, DEPENDING ON CLINICAL CONDITION.   Avel Peace Salary M.D on 11/01/2017   Between 7am to 6pm - Pager - [phone]  After 6pm go to [email or web address] - password [email or web address] [facility]  Office  [phone]  CC: Primary care physician; Baxter Hire, MD  Note: This dictation was prepared with Dragon dictation along with smaller phrase technology. Any transcriptional errors that result from this process are unintentional.

## 2017-11-01 NOTE — Clinical Social Work Note (Signed)
Clinical Social Work Assessment  Patient Details  Name: Sonya [name] MRN: [id number] Date of Birth: 23-Apr-1932  Date of referral:  11/01/17               Reason for consult:  Facility Placement                Permission sought to share information with:  Chartered certified accountant granted to share information::  Yes, Verbal Permission Granted  Name::        Agency::  [facility]  Relationship::     Contact Information:     Housing/Transportation Living arrangements for the past 2 months:  [place], [place] of Information:  Medical Team, Adult Children Patient Interpreter Needed:  None Criminal Activity/Legal Involvement Pertinent to Current Situation/Hospitalization:  No - Comment as needed Significant Relationships:  Adult Children, Warehouse manager, Spouse Lives with:  Spouse Do you feel safe going back to the place where you live?  Yes Need for family participation in patient care:  No (Coment)  Care giving concerns:  Patient admitted from [facility] / plan:  The CSW met with the patient and her son at bedside to discuss discharge planning. The patient was lethargic and verbalized that her son could speak on her behalf, after which time, she fell asleep. The patient's son confirmed that the patient has been at [facility] for the past 2 weeks and was going to discharge to her home on 11/07/17. The patient and her family would like her to return to [facility] to finish short term rehab for a safe discharge to her home where she lives with her spouse.  The CSW advised the patient's son that a reauthorization from [facility] would be necessary prior to discharge, for which he gave permission to conduct. The patient will most likely discharge tomorrow pending insurance authorization.  Employment status:  Retired Nurse, adult PT Recommendations:  Not assessed at this  time Information / Referral to community resources:     Patient/Family's Response to care:  The patient's son thanked the CSW.  Patient/Family's Understanding of and Emotional Response to Diagnosis, Current Treatment, and Prognosis:  The patient's family understands and agrees with the discharge plan.  Emotional Assessment Appearance:  Appears stated age Attitude/Demeanor/Rapport:  Lethargic Affect (typically observed):  Stable Orientation:  Oriented to Self, Oriented to Place, Oriented to  Time, Oriented to Situation Alcohol / Substance use:  Never Used Psych involvement (Current and /or in the community):  No (Comment)  Discharge Needs  Concerns to be addressed:  Care Coordination, Discharge Planning Concerns Readmission within the last 30 days:  Yes Current discharge risk:  Chronically ill, Physical Impairment Barriers to Discharge:  Continued Medical Work up, [place], LCSW 11/01/2017, 3:02 PM

## 2017-11-01 NOTE — Progress Notes (Signed)
Initial Nutrition Assessment  DOCUMENTATION CODES:   Not applicable  INTERVENTION:  Provide Hormel Shake (Vital Cuisine) TID with meals, each supplement provides 520 kcal and 22 grams of protein.  Provide Magic cup TID with meals, each supplement provides 290 kcal and 9 grams of protein.  Provide strawberry yogurt with meals per patient request.  Provide daily MVI.  NUTRITION DIAGNOSIS:   Inadequate oral intake related to decreased appetite, dysphagia as evidenced by meal completion < 25%, per patient/family report.  GOAL:   Patient will meet greater than or equal to 90% of their needs  MONITOR:   PO intake, Supplement acceptance, Labs, Weight trends, I & O's  REASON FOR ASSESSMENT:   Malnutrition Screening Tool    ASSESSMENT:   84 year old female with PMHx of HTN, CVA, GERD, CAD admitted with acute abdominal pain suspected due to constipation now resolved with BM, severe PVD with plan for outpatient vascular surgery follow-up, recent CVA.   -Following SLP evaluation patient was put on dysphagia 1 diet with nectar-thick liquids. -Plan is for patient to discharge to SNF tomorrow.  Met with patient and her husband at bedside. Neither are very great historians and patient's husband is very hard of hearing. Patient reports she is not eating well because she dislikes the pureed diet and thickened liquids. She is currently finishing <25% of meals. Encouraged adequate intake at meals. Patient amenable to trying Vital Cuisine and [employer]. She also really enjoys strawberry yogurt and requests it at every meal. Unable to provide any history on how her appetite typically is.  Patient/husband are unsure of weight history and there is limited history in chart. She was 56.1 kg on 10/12/2017. Currently 55 kg (121.25 lbs).   Medications reviewed and include: clonidine, levothyroxine, Miralax.  Labs reviewed: BUN 37, Creatinine 1.02.  Patient is at risk for malnutrition.  Discussed  with RN.  NUTRITION - FOCUSED PHYSICAL EXAM:    Most Recent Value  Orbital Region  No depletion  Upper Arm Region  Mild depletion  Thoracic and Lumbar Region  No depletion  Buccal Region  No depletion  Temple Region  Mild depletion  Clavicle Bone Region  Mild depletion  Clavicle and Acromion Bone Region  Mild depletion  Scapular Bone Region  Unable to assess  Dorsal Hand  Moderate depletion  Patellar Region  Moderate depletion  Anterior Thigh Region  Moderate depletion  Posterior Calf Region  Moderate depletion  Edema (RD Assessment)  None  Hair  Reviewed  Eyes  Reviewed  Mouth  Reviewed  Skin  Reviewed  Nails  Reviewed     Diet Order:   Diet Order            DIET - DYS 1 Room service appropriate? Yes; Fluid consistency: Nectar Thick  Diet effective now              EDUCATION NEEDS:   Not appropriate for education at this time  Skin:  Skin Assessment: Reviewed RN Assessment  Last BM:  10/30/2017 - medium type 5  Height:   Ht Readings from Last 1 Encounters:  10/30/17 5' (1.524 m)    Weight:   Wt Readings from Last 1 Encounters:  10/30/17 55 kg    Ideal Body Weight:  45.5 kg  BMI:  Body mass index is 23.68 kg/m.  Estimated Nutritional Needs:   Kcal:  1375-1650 (25-30 kcal/kg)  Protein:  65-75 grams (1.2-1.4 grams/kg)  Fluid:  1.4-1.7 L/day (1 mL/kcal)  Willey Blade, MS,  RD, LDN Office: [phone] Pager: [phone] After Hours/Weekend Pager: [phone]  

## 2017-11-01 NOTE — NC FL2 (Signed)
[place] LEVEL OF CARE SCREENING TOOL     IDENTIFICATION  Patient Name: Sonya [name] Birthdate: [birth year] Sex: female Admission Date (Current Location): 10/30/2017  [place] and [place] Number:  Engineering geologist and Address:  [facility], [address] [place],  [address]      Provider Number: [id number]  Attending Physician Name and Address:  Gorden Harms, MD  Relative Name and Phone Number:  Novalyn L[facility]) [phone]    Current Level of Care: Hospital Recommended Level of Care: [place] Prior Approval Number:    Date Approved/Denied:   PASRR Number: [id number]  Discharge Plan: SNF    Current Diagnoses: Patient Active Problem List   Diagnosis Date Noted  . Choledocholithiasis   . Dysphasia   . Abdominal pain 10/30/2017  . Cerebral embolism with cerebral infarction 10/13/2017  . CVA (cerebral vascular accident) ([place]) 10/13/2017  . Hypertension 10/12/2017  . Hypertensive urgency 10/12/2017  . Hypothyroidism 10/12/2017  . Left-sided weakness 10/12/2017  . Elevated troponin 10/12/2017  . Unknown when suspected stroke patient was last well 10/12/2017    Orientation RESPIRATION BLADDER Height & Weight     Self, Time, Situation, Place  Normal Continent Weight: 121 lb 4.1 oz (55 kg) Height:  5' (152.4 cm)  BEHAVIORAL SYMPTOMS/MOOD NEUROLOGICAL BOWEL NUTRITION STATUS      Continent Diet(Dysphagia 1, thin liquids, no straws)  AMBULATORY STATUS COMMUNICATION OF NEEDS Skin   Extensive Assist Verbally Normal                       Personal Care Assistance Level of Assistance  Bathing, Feeding, Dressing Bathing Assistance: Limited assistance Feeding assistance: Independent Dressing Assistance: Limited assistance     Functional Limitations Info    Sight Info: Adequate Hearing Info: Adequate Speech Info: Adequate    SPECIAL CARE FACTORS FREQUENCY  PT (By licensed PT), OT  (By licensed OT), Speech therapy     PT Frequency: 5X per week OT Frequency: 3X per week     Speech Therapy Frequency: 2X per week      Contractures Contractures Info: Not present    Additional Factors Info  Code Status, Allergies Code Status Info: DNR Allergies Info: Clopidogrel, Omeprazole           Current Medications (11/01/2017):  This is the current hospital active medication list Current Facility-Administered Medications  Medication Dose Route Frequency Provider Last Rate Last Dose  . acetaminophen (TYLENOL) tablet 650 mg  650 mg Oral Q6H PRN Dustin Flock, MD       Or  . acetaminophen (TYLENOL) suppository 650 mg  650 mg Rectal Q6H PRN Dustin Flock, MD      . benazepril (LOTENSIN) tablet 20 mg  20 mg Oral QHS Dustin Flock, MD   20 mg at 10/31/17 2224  . Chlorhexidine Gluconate Cloth 2 % PADS 6 each  6 each Topical Q0600 Dustin Flock, MD   6 each at 11/01/17 0600  . cloNIDine (CATAPRES) tablet 0.2 mg  0.2 mg Oral BID Dustin Flock, MD   0.2 mg at 11/01/17 0903  . levothyroxine (SYNTHROID, LEVOTHROID) tablet 25 mcg  25 mcg Oral QAC breakfast Dustin Flock, MD   25 mcg at 11/01/17 0903  . metoprolol tartrate (LOPRESSOR) tablet 25 mg  25 mg Oral BID Dustin Flock, MD   25 mg at 11/01/17 0903  . [START ON 11/02/2017] multivitamin with minerals tablet 1 tablet  1 tablet Oral Daily Salary, Montell  D, MD      . mupirocin ointment (BACTROBAN) 2 % 1 application  1 application Nasal BID Dustin Flock, MD   1 application at 50/72/25 [phone]  . ondansetron (ZOFRAN) tablet 4 mg  4 mg Oral Q6H PRN Dustin Flock, MD       Or  . ondansetron (ZOFRAN) injection 4 mg  4 mg Intravenous Q6H PRN Dustin Flock, MD      . oxyCODONE (Oxy IR/ROXICODONE) immediate release tablet 5 mg  5 mg Oral Q4H PRN Dustin Flock, MD      . polyethylene glycol (MIRALAX / GLYCOLAX) packet 17 g  17 g Oral Daily Vanga, Tally Due, MD      . simvastatin (ZOCOR) tablet 40 mg  40 mg Oral q1800  Dustin Flock, MD   40 mg at 10/31/17 2224  . venlafaxine XR (EFFEXOR-XR) 24 hr capsule 150 mg  150 mg Oral QHS Dustin Flock, MD   150 mg at 10/31/17 2217     Discharge Medications: Please see discharge summary for a list of discharge medications.  Relevant Imaging Results:  Relevant Lab Results:   Additional Information [phone]  Zettie Pho, LCSW

## 2017-11-02 MED ORDER — IBUPROFEN 400 MG PO TABS
400.0000 mg | ORAL_TABLET | Freq: Four times a day (QID) | ORAL | Status: DC | PRN
  Administered 2017-11-02: 400 mg via ORAL
  Filled 2017-11-02: qty 1

## 2017-11-02 MED ORDER — IBUPROFEN 400 MG PO TABS: 400.0000 mg | ORAL_TABLET | Freq: Four times a day (QID) | ORAL | Status: DC | PRN

## 2017-11-02 MED ORDER — AMLODIPINE BESYLATE 10 MG PO TABS
10.0000 mg | ORAL_TABLET | Freq: Every day | ORAL | Status: DC
  Administered 2017-11-02 – 2017-11-03 (×2): 10 mg via ORAL
  Filled 2017-11-02 (×2): qty 1

## 2017-11-02 MED ORDER — ASPIRIN EC 81 MG PO TBEC
81.0000 mg | DELAYED_RELEASE_TABLET | Freq: Every day | ORAL | Status: DC
  Administered 2017-11-02 – 2017-11-03 (×2): 81 mg via ORAL
  Filled 2017-11-02 (×2): qty 1

## 2017-11-02 MED ORDER — OXYCODONE HCL 5 MG PO TABS: 5.0000 mg | ORAL_TABLET | ORAL | Status: DC | PRN

## 2017-11-02 NOTE — Clinical Social Work Note (Signed)
CSW faxed in clinical to [facility]. It is doubtful Josem Kaufmann will be received from the insurance this late in the day. CSW will continue to follow. Shela Leff MSW,LcSW [phone]

## 2017-11-02 NOTE — Progress Notes (Signed)
[facility] - [place] at [facility]   PATIENT NAME: Sonya [name]    MR#:  [id number]  DATE OF BIRTH:  [birth year]  SUBJECTIVE:  CHIEF COMPLAINT:   Chief Complaint  Patient presents with  . Emesis  . Abdominal Pain  Patient complains of chronic back pain only-may better with ibuprofen typically per patient, husband at the bedside  REVIEW OF SYSTEMS:  CONSTITUTIONAL: No fever, fatigue or weakness.  EYES: No blurred or double vision.  EARS, NOSE, AND THROAT: No tinnitus or ear pain.  RESPIRATORY: No cough, shortness of breath, wheezing or hemoptysis.  CARDIOVASCULAR: No chest pain, orthopnea, edema.  GASTROINTESTINAL: No nausea, vomiting, diarrhea or abdominal pain.  GENITOURINARY: No dysuria, hematuria.  ENDOCRINE: No polyuria, nocturia,  HEMATOLOGY: No anemia, easy bruising or bleeding SKIN: No rash or lesion. MUSCULOSKELETAL: No joint pain or arthritis.   NEUROLOGIC: No tingling, numbness, weakness.  PSYCHIATRY: No anxiety or depression.   ROS  DRUG ALLERGIES:   Allergies  Allergen Reactions  . Clopidogrel Nausea And Vomiting     reported by [facility] 11/24/13  . Omeprazole Other (See Comments)    Unknown reaction - reported by [facility] 11/24/13    VITALS:  Blood pressure (!) 189/57, pulse (!) 56, temperature 98.5 F (36.9 C), temperature source Oral, resp. rate 15, height 5' (1.524 m), weight 55 kg, SpO2 96 %.  PHYSICAL EXAMINATION:  GENERAL:  84 y.o.-year-old patient lying in the bed with no acute distress.  EYES: Pupils equal, round, reactive to light and accommodation. No scleral icterus. Extraocular muscles intact.  HEENT: Head atraumatic, normocephalic. Oropharynx and nasopharynx clear.  NECK:  Supple, no jugular venous distention. No thyroid enlargement, no tenderness.  LUNGS: Normal breath sounds bilaterally, no wheezing, rales,rhonchi or crepitation. No use of accessory muscles of respiration.   CARDIOVASCULAR: S1, S2 normal. No murmurs, rubs, or gallops.  ABDOMEN: Soft, nontender, nondistended. Bowel sounds present. No organomegaly or mass.  EXTREMITIES: No pedal edema, cyanosis, or clubbing.  NEUROLOGIC: Cranial nerves II through XII are intact. Muscle strength 5/5 in all extremities. Sensation intact. Gait not checked.  PSYCHIATRIC: The patient is alert and oriented x 3.  SKIN: No obvious rash, lesion, or ulcer.   Physical Exam LABORATORY PANEL:   CBC Recent Labs  Lab 11/01/17 0458  WBC 9.8  HGB 12.6  HCT 37.5  PLT 210   ------------------------------------------------------------------------------------------------------------------  Chemistries  Recent Labs  Lab 10/31/17 0508  NA 140  K 4.4  CL 110  CO2 27  GLUCOSE 87  BUN 37*  CREATININE 1.02*  CALCIUM 9.1  AST 27  ALT 12  ALKPHOS 67  BILITOT 0.8   ------------------------------------------------------------------------------------------------------------------  Cardiac Enzymes Recent Labs  Lab 10/30/17 1159  TROPONINI <0.03   ------------------------------------------------------------------------------------------------------------------  RADIOLOGY:  No results found.  ASSESSMENT AND PLAN:  Patient is 84 year old presenting with abdominal pain  *Acute abdominal pain Resolved Suspected due to acute constipation-resolved with bowel movement Was initially thought to be due to choledocholithiasis, gastroenterology input appreciated-in discussion with Dr.Wohl -concern given recent CVA -in discussion with neurology-aspirin was reduced to 81 mg daily, for EGD/ERCP as an outpatient in 4 to 5 days  Vascular surgery input appreciated-recommended outpatient follow-up with abdominal ultrasound for further evaluation of atherosclerotic disease Diet as tolerated  *Severe peripheral vascular disease Vascular surgery input appreciated-for outpatient follow-up for continued surveillance/no acute  intervention recommended CT noted forsevere atherosclerotic disease involving the aorta and visceral arteries/Suspect significant stenosis involving the SMA and possibly  the celiac trunk. In reviewing her recent hospitalization she has severe left scope subclavian stenosis as well as left carotid artery 50 to 70% stenosis  *Essential hypertension Controlled onclonidine,Lotensin,Lopressor  *Hypothyroidism  continue Synthroid  *Hyperlipidemia continue Zocor  *Depression continue Effexor  *Acute recent CVA Stable Continue increased nursing care PRN, aspiration/fall/skin care precautions while in house, speech therapy input appreciated-dysphagia 1 diet with nectar thickened liquids   *Chronic dysphasia Secondary to recent CVA Continue dysphagia 1 diet with nectar thickened liquids  *Acute on chronic pain syndrome Add Motrin as needed moderate pain  Disposition to skilled nursing facility when bed is available     All the records are reviewed and case discussed with Care Management/Social Workerr. Management plans discussed with the patient, family and they are in agreement.  CODE STATUS:dnr  TOTAL TIME TAKING CARE OF THIS PATIENT: 40 minutes.     POSSIBLE D/C IN 1-2 DAYS, DEPENDING ON CLINICAL CONDITION.   Evelena Asa [name] M.D on 11/02/2017   Between 7am to 6pm - Pager - [phone]  After 6pm go to [email or web address] - password [email or web address]  [facility]  Office  [phone]  CC: Primary care physician; Gracelyn Nurse, MD  Note: This dictation was prepared with Dragon dictation along with smaller phrase technology. Any transcriptional errors that result from this process are unintentional.

## 2017-11-02 NOTE — Evaluation (Signed)
Physical Therapy Evaluation Patient Details Name: Sonya [name] MRN: [id number] DOB: 07-13-1932 Today's Date: 11/02/2017   History of Present Illness  presented to ER from STR secondary to abdominal pain, nausea/vomiting; admitted with acute constipation vs choledocolithiasis, also noted with severe abdominal artery stenosis.  Clinical Impression  Upon evaluation, patient alert and oriented to basic information; follows commands, but requires increased time/cuing for task initiation and sequencing.  Visual/gaze preference to R noted; does track and attend to L side, but requires max cuing and use of compensatory strategies to initiate.  Significant L UE > LE hemiparesis evident, distal > proximal; mod/max coordination deficits; denies sensory deficit.  Currently requiring max assist +1-2 for bed mobility; max assist +2 for sit/stand and static standing balance.  Significant pushing behaviors to L noted with all upright positioning; constant cuing/assist from therapist to correct.  Demonstrates limited insight into deficits, limited/no ability to self-initiate righting reactions.  Very high fall risk with all mobility attempts.  Unsafe for husband to attempt management alone at this time. Would benefit from skilled PT to address above deficits and promote optimal return to PLOF; recommend transition to STR upon discharge from acute hospitalization.     Follow Up Recommendations SNF    Equipment Recommendations       Recommendations for Other Services       Precautions / Restrictions Precautions Precautions: Fall Precaution Comments: Nectar liquids Restrictions Weight Bearing Restrictions: No      Mobility  Bed Mobility Overal bed mobility: Needs Assistance Bed Mobility: Supine to Sit;Sit to Supine   Sidelying to sit: Max assist Supine to sit: Total assist     General bed mobility comments: assist for guidance and sequencing of movement transition; asisst for truncal elevation  and midline orientation once upright (mod pushing behaviors to L noted)  Transfers Overall transfer level: Needs assistance   Transfers: Sit to/from Stand Sit to Stand: Max assist;+2 physical assistance         General transfer comment: constant manual facilitation for midline orientation in M/L and A/P plane, postural extension and upward gaze.  Continued pushing behaviors to L (worsened if R palm in closed-chain contact). Limited aware of altered midline; absent attempts at spontaneous correction  Ambulation/Gait             General Gait Details: unsafe/unable at this time  Stairs            Wheelchair Mobility    Modified Rankin (Stroke Patients Only)       Balance Overall balance assessment: Needs assistance Sitting-balance support: No upper extremity supported;Feet supported Sitting balance-Leahy Scale: Poor Sitting balance - Comments: pushing behaviors towards L, mod assist to attain/maintain balance (R UE propped on pillows to R lateral aspect of patient)   Standing balance support: Bilateral upper extremity supported Standing balance-Leahy Scale: Zero Standing balance comment: poor balance in A/P, M/L planes; absent awareness of altered midline, absent attempts at spontaneous correction                             Pertinent Vitals/Pain Pain Assessment: Faces Faces Pain Scale: Hurts little more Pain Location: chronic back pain Pain Descriptors / Indicators: Aching;Grimacing;Guarding Pain Intervention(s): Limited activity within patient's tolerance;Monitored during session;Repositioned    Home Living Family/patient expects to be discharged to:: Skilled nursing facility(at baselines, lives with husband in single-level home)  Prior Function Level of Independence: Independent with assistive device(s)         Comments: At baseline, mod indep with assist device for ADLs, household mobility; since CVA (approx 2  weeks prior), requiring up to max assist +2 for all functional activities (per notes)     Hand Dominance   Dominant Hand: Right    Extremity/Trunk Assessment   Upper Extremity Assessment Upper Extremity Assessment: (L shoulder and elbow grossly 1/5, wrist and hand 0/5; mild flexor tone/synergy noted with exertion.  Denies sensory deficit.  R UE grossly 4/5)    Lower Extremity Assessment Lower Extremity Assessment: (L LE hip and knee grossly 3-/5, ankle 0/5; + babinski with non-sustained, multi-beat clonus L ankle PF; generally discoordinated, denies sensory deficit.  R LE grossly 4/5)       Communication   Communication: (dysarthric, L facial droop)  Cognition Arousal/Alertness: Awake/alert Behavior During Therapy: WFL for tasks assessed/performed                                   General Comments: decreased visual scanning/awareness of L hemibody and environment (? visual deficit?); follows simple commands, but requires increased time for processing.  Limited insight into deficits and need for assist.      General Comments      Exercises Other Exercises Other Exercises: Unsupported sitting edge of bed, participated with R UE dynamic reaching tasks (in modified D2 extension pattern) to promote R ant/lateral weight shift, improved trunk control and awareness of midline orientation.  Pushing to L if R UE not supported/guided in appropriate direction.  Absent self/protective righting reactions evident. Other Exercises: Sit/stand x3 with bilat HHA, max assist +2 for lift off, postural extension, midline (in all planes) and L hip/knee stabilization.  Continued pushing to L, but improved with appropriate handling/facilitation techniques. Unable to maintain >10-15 seconds per trial due to fatigue.  Constant cuing for visual scanning midline to/from left (significant inattention vs field cut evident?)   Assessment/Plan    PT Assessment Patient needs continued PT services   PT Problem List Decreased strength;Decreased mobility;Decreased activity tolerance;Decreased balance;Decreased knowledge of use of DME;Decreased cognition;Decreased knowledge of precautions;Decreased safety awareness;Decreased range of motion;Decreased coordination;Impaired tone       PT Treatment Interventions DME instruction;Therapeutic activities;Patient/family education;Therapeutic exercise;Balance training;Neuromuscular re-education;Functional mobility training;Gait training    PT Goals (Current goals can be found in the Care Plan section)  Acute Rehab PT Goals Patient Stated Goal: to return to rehab to complete therapy PT Goal Formulation: With patient/family Time For Goal Achievement: 11/16/17 Potential to Achieve Goals: Fair    Frequency Min 2X/week   Barriers to discharge        Co-evaluation               AM-PAC PT "6 Clicks" Daily Activity  Outcome Measure Difficulty turning over in bed (including adjusting bedclothes, sheets and blankets)?: Unable Difficulty moving from lying on back to sitting on the side of the bed? : Unable Difficulty sitting down on and standing up from a chair with arms (e.g., wheelchair, bedside commode, etc,.)?: Unable Help needed moving to and from a bed to chair (including a wheelchair)?: Total Help needed walking in hospital room?: Total Help needed climbing 3-5 steps with a railing? : Total 6 Click Score: 6    End of Session Equipment Utilized During Treatment: Gait belt Activity Tolerance: Patient tolerated treatment well Patient left: in bed;with bed alarm  set;with family/visitor present;with call bell/phone within reach Nurse Communication: Mobility status PT Visit Diagnosis: Muscle weakness (generalized) (M62.81);Unsteadiness on feet (R26.81);Difficulty in walking, not elsewhere classified (R26.2);Hemiplegia and hemiparesis Hemiplegia - Right/Left: Left Hemiplegia - dominant/non-dominant: Non-dominant Hemiplegia - caused by:  Cerebral infarction    Time: 9562-1308 PT Time Calculation (min) (ACUTE ONLY): 25 min   Charges:   PT Evaluation $PT Eval Moderate Complexity: 1 Mod PT Treatments $Neuromuscular Re-education: 8-22 mins       Ellisyn Icenhower H. Manson Passey, PT, DPT, NCS 11/02/17, 2:09 PM [phone]

## 2017-11-02 NOTE — Progress Notes (Signed)
  Speech Language Pathology Treatment: Dysphagia  Patient Details Name: Sonya [name] MRN: [id number] DOB: [birth year] Today's Date: 11/02/2017 Time:  -     Assessment / Plan / Recommendation Clinical Impression  Patient continues with mod oral phase dysphagia. Patient tolerated trials of ice chips and small sips of thin liquid with no overt s/s aspiration or laryngeal penetration; however patient continues to be at high risk of aspiration with thin liquids due to oral phase deficits and lethargy. Noted mild L buccal residue with puree which easily cleared with cues for L lingual sweep and sip of nectar thick liquid. Patient continues with L anterior spillage and oral holding of thin liquids. Patient repeatedly complains her sense of taste and smell haven't been the same since her stroke and complains "water and even ice tastes horrible." Patient reports dislike of the pureed pork chop and gravy and pureed scrambled eggs. Discussed food preferences, and modified diet order to include V8, strawberry yogurt, creamed soups. Added a note to nursing allowing permission to add crushed saltines to creamed soups. Recommend continue with Dysphagia I (puree) diet with nectar thick liquids. SLP to f/u and offer trials of thin liquid and soft solid as appropriate to determine safety of possible diet upgrade. Recommend skilled SLP services at d/c for dysphagia and dysarthria treatment.    HPI HPI: Per admitting H&P: Sonya [name]  is a 84 y.o. female with a known history of coronary artery disease, GERD, hypertension, hyperlipidemia who had a stroke 2 weeks ago.  She had a left upper extremity left lower extremity weakness and facial droop.  Patient was in rehab when she started having abdominal pain nausea vomiting for the past few days.  Patient also has had poor appetite.  In the ER CT scan of the abdomen shows  Choldeocolithiasis.  Patient also was noted to have severe abdominal arteries stenosis as well.     SLP Plan  Continue with current plan of care       Recommendations  Diet recommendations: Dysphagia 1 (puree);Nectar-thick liquid Liquids provided via: Cup Medication Administration: Crushed with puree Supervision: Full supervision/cueing for compensatory strategies;Staff to assist with self feeding Compensations: Minimize environmental distractions;Slow rate;Small sips/bites;Lingual sweep for clearance of pocketing;Follow solids with liquid Postural Changes and/or Swallow Maneuvers: Seated upright 90 degrees;Upright 30-60 min after meal                Oral Care Recommendations: Oral care BID Follow up Recommendations: Skilled Nursing facility SLP Visit Diagnosis: Dysphagia, unspecified (R13.10) Plan: Continue with current plan of care       GO                [place], [place], [place] 11/02/2017, 12:51 PM

## 2017-11-02 NOTE — Clinical Social Work Note (Signed)
Patient will need to be seen by PT and then this documentation will need to be sent to insurance for re-auth, CSW has spoken to patient's husband and he is aware. Shela Leff MSW,LCSW [phone]

## 2017-11-02 NOTE — Progress Notes (Signed)
PT Cancellation Note  Patient Details Name: Sonya [name] MRN: [id number] DOB: 02/03/1933   Cancelled Treatment:    Reason Eval/Treat Not Completed: Patient declined, no reason specified(Imminent PT evaluation requested per care team.  Meal tray arrived just as therapist initiating evaluation.  Patient request therapist hold and re-attempt at later time this date.  Will continue efforts later this PM as appropriate.)   Quanita Barona H. Owens Shark, PT, DPT, NCS 11/02/17, 3:00 PM [phone]

## 2017-11-03 MED ORDER — MUPIROCIN 2 % EX OINT: 1.0000 "application " | TOPICAL_OINTMENT | Freq: Two times a day (BID) | CUTANEOUS | 0 refills | Status: DC

## 2017-11-03 MED ORDER — IBUPROFEN 400 MG PO TABS: 400.0000 mg | ORAL_TABLET | Freq: Three times a day (TID) | ORAL | 0 refills | Status: DC | PRN

## 2017-11-03 MED ORDER — AMLODIPINE BESYLATE 10 MG PO TABS: 10.0000 mg | ORAL_TABLET | Freq: Every day | ORAL | 0 refills | Status: DC

## 2017-11-03 MED ORDER — ASPIRIN 81 MG PO TBEC: 81.0000 mg | DELAYED_RELEASE_TABLET | Freq: Every day | ORAL | 0 refills | Status: DC

## 2017-11-03 MED ORDER — POLYETHYLENE GLYCOL 3350 17 G PO PACK: 17.0000 g | PACK | Freq: Every day | ORAL | 0 refills | Status: DC | PRN

## 2017-11-03 NOTE — Progress Notes (Signed)
Pt alert and oriented to baseline, no acute distress. Discharge instructions given to patient and husband.

## 2017-11-03 NOTE — Care Management Important Message (Signed)
Important Message  Patient Details  Name: [name] MRN: [id number] Date of Birth: 1932-12-23   Medicare Important Message Given:  Yes    Beverly Sessions, RN 11/03/2017, 10:31 AM

## 2017-11-03 NOTE — Discharge Summary (Signed)
[facility] [place] at [facility]   PATIENT NAME: Sonya [name]    MR#:  [id number]  DATE OF BIRTH:  [birth year]  DATE OF ADMISSION:  10/30/2017 ADMITTING PHYSICIAN: Auburn Bilberry, MD  DATE OF DISCHARGE: No discharge date for patient encounter.  PRIMARY CARE PHYSICIAN: Gracelyn Nurse, MD    ADMISSION DIAGNOSIS:  Vomiting [R11.10]  DISCHARGE DIAGNOSIS:  Active Problems:   Abdominal pain   Choledocholithiasis   Dysphasia   SECONDARY DIAGNOSIS:   Past Medical History:  Diagnosis Date  . CAD (coronary artery disease)   . Carpal tunnel syndrome   . GERD (gastroesophageal reflux disease)   . History of shingles   . Hormone receptor positive breast cancer (HCC)   . Hyperlipemia   . Hypertension   . Stroke [facility])     HOSPITAL COURSE:  Patient is 84 year old presenting with abdominal pain  *Acute abdominal pain Resolved Suspected due to acute constipation-resolved with bowel movement Was initially thought to be due to choledocholithiasis, gastroenterology input appreciated-in discussion with Dr.Wohl -concern given recent CVA-in discussion with neurology-aspirin was reduced to 81 mg daily, for EGD/ERCP as an outpatient in 4 to 5 days Vascular surgery input appreciated-recommended outpatient follow-up with abdominal ultrasound for further evaluation of atherosclerotic disease Diet as tolerated  *Severe peripheral vascular disease Vascular surgery input appreciated-for outpatient follow-up for continued surveillance/no acute intervention recommended CT noted forsevere atherosclerotic disease involving the aorta and visceral arteries/Suspect significant stenosis involving the SMA and possibly the celiac trunk. In reviewing her recent hospitalization she has severe left scope subclavian stenosis as well as left carotid artery 50 to 70% stenosis  *Essential hypertension Controlled onclonidine,Lotensin,Lopressor, norvasc  *Hypothyroidism   continued Synthroid  *Hyperlipidemia continue Zocor  *Depression continued Effexor  *Acute recent CVA Stable Continue increased nursing care PRN, aspiration/fall/skin care precautions while in house, speech therapyinput appreciated-dysphagia 1 diet with nectar thickened liquids   *Chronic dysphasia Secondary to recent CVA Continue dysphagia 1 diet with nectar thickened liquids  *Acute on chronic pain syndrome Stable on current regiment    DISCHARGE CONDITIONS:  stable  CONSULTS OBTAINED:  Treatment Team:  Toney Reil, MD Schnier, Latina Craver, MD Kym Groom, MD  DRUG ALLERGIES:   Allergies  Allergen Reactions  . Clopidogrel Nausea And Vomiting     reported by [facility] 11/24/13  . Omeprazole Other (See Comments)    Unknown reaction - reported by [facility] 11/24/13    DISCHARGE MEDICATIONS:   Allergies as of 11/03/2017      Reactions   Clopidogrel Nausea And Vomiting    reported by [facility] 11/24/13   Omeprazole Other (See Comments)   Unknown reaction - reported by [facility] 11/24/13      Medication List    STOP taking these medications   aspirin 325 MG tablet Replaced by:  aspirin 81 MG EC tablet     TAKE these medications   acetaminophen 325 MG tablet Commonly known as:  TYLENOL Take 2 tablets (650 mg total) by mouth every 4 (four) hours as needed for mild pain (or temp > 37.5 C (99.5 F)).   amLODipine 10 MG tablet Commonly known as:  NORVASC Take 1 tablet (10 mg total) by mouth daily. Start taking on:  11/04/2017   aspirin 81 MG EC tablet Take 1 tablet (81 mg total) by mouth daily. Start taking on:  11/04/2017 Replaces:  aspirin 325 MG tablet   b complex vitamins  tablet Take 1 tablet by mouth daily.   benazepril 20 MG tablet Commonly known as:  LOTENSIN Take 20 mg by mouth at bedtime.   cloNIDine 0.2 MG tablet Commonly known as:  CATAPRES Take  1 tablet (0.2 mg total) by mouth 2 (two) times daily.   DUCODYL 5 MG EC tablet Generic drug:  bisacodyl Take 10 mg by mouth daily.   ibuprofen 400 MG tablet Commonly known as:  ADVIL,MOTRIN Take 1 tablet (400 mg total) by mouth every 8 (eight) hours as needed for moderate pain.   lansoprazole 15 MG capsule Commonly known as:  PREVACID Take 15 mg by mouth at bedtime.   levothyroxine 25 MCG tablet Commonly known as:  SYNTHROID, LEVOTHROID Take 25 mcg by mouth daily before breakfast.   magic mouthwash Soln Take 5 mLs by mouth 3 (three) times daily. Swish and spit   metoprolol tartrate 25 MG tablet Commonly known as:  LOPRESSOR Take 1 tablet (25 mg total) by mouth 2 (two) times daily.   mupirocin ointment 2 % Commonly known as:  BACTROBAN Place 1 application into the nose 2 (two) times daily.   nystatin 100000 UNIT/ML suspension Commonly known as:  MYCOSTATIN Take 5 mLs by mouth 4 (four) times daily.   ondansetron 4 MG disintegrating tablet Commonly known as:  ZOFRAN-ODT Take 4 mg by mouth every 8 (eight) hours as needed for nausea or vomiting.   polyethylene glycol packet Commonly known as:  MIRALAX / GLYCOLAX Take 17 g by mouth daily as needed.   senna-docusate 8.6-50 MG tablet Commonly known as:  Senokot-S Take 2 tablets by mouth at bedtime.   simvastatin 40 MG tablet Commonly known as:  ZOCOR Take 1 tablet (40 mg total) by mouth daily at 6 PM.   venlafaxine XR 150 MG 24 hr capsule Commonly known as:  EFFEXOR-XR Take 150 mg by mouth at bedtime.   Vitamin D3 1000 units Caps Take 3,000 Units by mouth at bedtime.        DISCHARGE INSTRUCTIONS:  If you experience worsening of your admission symptoms, develop shortness of breath, life threatening emergency, suicidal or homicidal thoughts you must seek medical attention immediately by calling 911 or calling your MD immediately  if symptoms less severe.  You Must read complete instructions/literature along with  all the possible adverse reactions/side effects for all the Medicines you take and that have been prescribed to you. Take any new Medicines after you have completely understood and accept all the possible adverse reactions/side effects.   Please note  You were cared for by a hospitalist during your hospital stay. If you have any questions about your discharge medications or the care you received while you were in the hospital after you are discharged, you can call the unit and asked to speak with the hospitalist on call if the hospitalist that took care of you is not available. Once you are discharged, your primary care physician will handle any further medical issues. Please note that NO REFILLS for any discharge medications will be authorized once you are discharged, as it is imperative that you return to your primary care physician (or establish a relationship with a primary care physician if you do not have one) for your aftercare needs so that they can reassess your need for medications and monitor your lab values.    Today   CHIEF COMPLAINT:   Chief Complaint  Patient presents with  . Emesis  . Abdominal Pain    HISTORY OF PRESENT ILLNESS:  84 y.o. female with a known history of coronary artery disease, GERD, hypertension, hyperlipidemia who had a stroke 2 weeks ago.  She had a left upper extremity left lower extremity weakness and facial droop.  Patient was in rehab when she started having abdominal pain nausea vomiting for the past few days.  Patient also has had poor appetite.  In the ER CT scan of the abdomen shows  Choldeocolithiasis.  Patient also was noted to have severe abdominal arteries stenosis as well.  VITAL SIGNS:  Blood pressure (!) 150/64, pulse 61, temperature 98 F (36.7 C), temperature source Oral, resp. rate 16, height 5' (1.524 m), weight 55 kg, SpO2 94 %.  I/O:    Intake/Output Summary (Last 24 hours) at 11/03/2017 1150 Last data filed at 11/03/2017 0900 Gross  per 24 hour  Intake 300 ml  Output 400 ml  Net -100 ml    PHYSICAL EXAMINATION:  GENERAL:  84 y.o.-year-old patient lying in the bed with no acute distress.  EYES: Pupils equal, round, reactive to light and accommodation. No scleral icterus. Extraocular muscles intact.  HEENT: Head atraumatic, normocephalic. Oropharynx and nasopharynx clear.  NECK:  Supple, no jugular venous distention. No thyroid enlargement, no tenderness.  LUNGS: Normal breath sounds bilaterally, no wheezing, rales,rhonchi or crepitation. No use of accessory muscles of respiration.  CARDIOVASCULAR: S1, S2 normal. No murmurs, rubs, or gallops.  ABDOMEN: Soft, non-tender, non-distended. Bowel sounds present. No organomegaly or mass.  EXTREMITIES: No pedal edema, cyanosis, or clubbing.  NEUROLOGIC: Cranial nerves II through XII are intact. Muscle strength 5/5 in all extremities. Sensation intact. Gait not checked.  PSYCHIATRIC: The patient is alert and oriented x 3.  SKIN: No obvious rash, lesion, or ulcer.   DATA REVIEW:   CBC Recent Labs  Lab 11/01/17 0458  WBC 9.8  HGB 12.6  HCT 37.5  PLT 210    Chemistries  Recent Labs  Lab 10/31/17 0508  NA 140  K 4.4  CL 110  CO2 27  GLUCOSE 87  BUN 37*  CREATININE 1.02*  CALCIUM 9.1  AST 27  ALT 12  ALKPHOS 67  BILITOT 0.8    Cardiac Enzymes Recent Labs  Lab 10/30/17 1159  TROPONINI <0.03    Microbiology Results  Results for orders placed or performed during the hospital encounter of 10/30/17  Urine culture     Status: None   Collection Time: 10/30/17 11:32 AM  Result Value Ref Range Status   Specimen Description   Final    URINE, RANDOM Performed at [facility], [address] [place], [place] [address]    Special Requests   Final    NONE Performed at [facility], [address], [place], [place] [address]    Culture   Final    NO GROWTH Performed at [facility], [address] [place], [place] [address]     Report Status 10/31/2017 FINAL  Final  MRSA PCR Screening     Status: Abnormal   Collection Time: 10/30/17  6:37 PM  Result Value Ref Range Status   MRSA by PCR POSITIVE (A) NEGATIVE Final    Comment:        The GeneXpert MRSA Assay (FDA approved for NASAL specimens only), is one component of a comprehensive MRSA colonization surveillance program. It is not intended to diagnose MRSA infection nor to guide or monitor treatment for MRSA infections. RESULT CALLED TO, READ BACK BY AND VERIFIED WITH: MARSHA HATCH @1952  10/30/17 AKT Performed  at [facility], [address], [place], [place] [address]     RADIOLOGY:  No results found.  EKG:   Orders placed or performed during the hospital encounter of 10/30/17  . EKG 12-Lead  . EKG 12-Lead  . EKG 12-Lead  . EKG 12-Lead  . EKG      Management plans discussed with the patient, family and they are in agreement.  CODE STATUS:     Code Status Orders  (From admission, onward)         Start     Ordered   10/30/17 1648  Do not attempt resuscitation (DNR)  Continuous    Question Answer Comment  In the event of cardiac or respiratory ARREST Do not call a "code blue"   In the event of cardiac or respiratory ARREST Do not perform Intubation, CPR, defibrillation or ACLS   In the event of cardiac or respiratory ARREST Use medication by any route, position, wound care, and other measures to relive pain and suffering. May use oxygen, suction and manual treatment of airway obstruction as needed for comfort.      10/30/17 1648        Code Status History    Date Active Date Inactive Code Status Order ID Comments User Context   10/12/2017 2234 10/15/2017 2130 Full Code [id number]  OpydLavone Neri, MD ED      TOTAL TIME TAKING CARE OF THIS PATIENT: 45 minutes.    Evelena Asa Mallary Kreger M.D on 11/03/2017 at 11:50 AM  Between 7am to 6pm - Pager - [phone]  After 6pm go to [email or web address] - password [email or web address]  [facility]  Office  [phone]  CC: Primary care physician; Gracelyn Nurse, MD   Note: This dictation was prepared with Dragon dictation along with smaller phrase technology. Any transcriptional errors that result from this process are unintentional.

## 2017-11-05 ENCOUNTER — Other Ambulatory Visit: Payer: Self-pay

## 2017-11-05 ENCOUNTER — Inpatient Hospital Stay
Admission: EM | Admit: 2017-11-05 | Discharge: 2017-11-16 | DRG: 683 | Disposition: A | Discharge status: Skilled Nursing Facility | Payer: Medicare PPO | Source: Skilled Nursing Facility | Attending: Internal Medicine | Admitting: Internal Medicine

## 2017-11-05 DIAGNOSIS — K2900 Acute gastritis without bleeding: Secondary | ICD-10-CM | POA: Diagnosis not present

## 2017-11-05 DIAGNOSIS — I2510 Atherosclerotic heart disease of native coronary artery without angina pectoris: Secondary | ICD-10-CM | POA: Diagnosis present

## 2017-11-05 DIAGNOSIS — G629 Polyneuropathy, unspecified: Secondary | ICD-10-CM | POA: Diagnosis present

## 2017-11-05 DIAGNOSIS — R609 Edema, unspecified: Secondary | ICD-10-CM

## 2017-11-05 DIAGNOSIS — Z90710 Acquired absence of both cervix and uterus: Secondary | ICD-10-CM

## 2017-11-05 DIAGNOSIS — R4702 Dysphasia: Secondary | ICD-10-CM | POA: Diagnosis not present

## 2017-11-05 DIAGNOSIS — Z853 Personal history of malignant neoplasm of breast: Secondary | ICD-10-CM | POA: Diagnosis not present

## 2017-11-05 DIAGNOSIS — I82432 Acute embolism and thrombosis of left popliteal vein: Secondary | ICD-10-CM | POA: Diagnosis not present

## 2017-11-05 DIAGNOSIS — I82612 Acute embolism and thrombosis of superficial veins of left upper extremity: Secondary | ICD-10-CM | POA: Diagnosis not present

## 2017-11-05 DIAGNOSIS — Z66 Do not resuscitate: Secondary | ICD-10-CM | POA: Diagnosis present

## 2017-11-05 DIAGNOSIS — M549 Dorsalgia, unspecified: Secondary | ICD-10-CM | POA: Diagnosis present

## 2017-11-05 DIAGNOSIS — Z8619 Personal history of other infectious and parasitic diseases: Secondary | ICD-10-CM

## 2017-11-05 DIAGNOSIS — Z9861 Coronary angioplasty status: Secondary | ICD-10-CM | POA: Diagnosis not present

## 2017-11-05 DIAGNOSIS — R52 Pain, unspecified: Secondary | ICD-10-CM

## 2017-11-05 DIAGNOSIS — E039 Hypothyroidism, unspecified: Secondary | ICD-10-CM | POA: Diagnosis present

## 2017-11-05 DIAGNOSIS — Z87891 Personal history of nicotine dependence: Secondary | ICD-10-CM

## 2017-11-05 DIAGNOSIS — M7989 Other specified soft tissue disorders: Secondary | ICD-10-CM

## 2017-11-05 DIAGNOSIS — Z7982 Long term (current) use of aspirin: Secondary | ICD-10-CM | POA: Diagnosis not present

## 2017-11-05 DIAGNOSIS — R932 Abnormal findings on diagnostic imaging of liver and biliary tract: Secondary | ICD-10-CM

## 2017-11-05 DIAGNOSIS — R112 Nausea with vomiting, unspecified: Secondary | ICD-10-CM | POA: Diagnosis not present

## 2017-11-05 DIAGNOSIS — I6522 Occlusion and stenosis of left carotid artery: Secondary | ICD-10-CM | POA: Diagnosis present

## 2017-11-05 DIAGNOSIS — K8050 Calculus of bile duct without cholangitis or cholecystitis without obstruction: Secondary | ICD-10-CM | POA: Diagnosis present

## 2017-11-05 DIAGNOSIS — Z888 Allergy status to other drugs, medicaments and biological substances status: Secondary | ICD-10-CM

## 2017-11-05 DIAGNOSIS — I824Y2 Acute embolism and thrombosis of unspecified deep veins of left proximal lower extremity: Secondary | ICD-10-CM | POA: Diagnosis not present

## 2017-11-05 DIAGNOSIS — G894 Chronic pain syndrome: Secondary | ICD-10-CM | POA: Diagnosis present

## 2017-11-05 DIAGNOSIS — Z79899 Other long term (current) drug therapy: Secondary | ICD-10-CM

## 2017-11-05 DIAGNOSIS — F329 Major depressive disorder, single episode, unspecified: Secondary | ICD-10-CM | POA: Diagnosis present

## 2017-11-05 DIAGNOSIS — I808 Phlebitis and thrombophlebitis of other sites: Secondary | ICD-10-CM | POA: Diagnosis not present

## 2017-11-05 DIAGNOSIS — K219 Gastro-esophageal reflux disease without esophagitis: Secondary | ICD-10-CM | POA: Diagnosis present

## 2017-11-05 DIAGNOSIS — N179 Acute kidney failure, unspecified: Secondary | ICD-10-CM | POA: Diagnosis present

## 2017-11-05 DIAGNOSIS — Z8041 Family history of malignant neoplasm of ovary: Secondary | ICD-10-CM

## 2017-11-05 DIAGNOSIS — K449 Diaphragmatic hernia without obstruction or gangrene: Secondary | ICD-10-CM | POA: Diagnosis present

## 2017-11-05 DIAGNOSIS — K5900 Constipation, unspecified: Secondary | ICD-10-CM | POA: Diagnosis present

## 2017-11-05 DIAGNOSIS — R432 Parageusia: Secondary | ICD-10-CM | POA: Diagnosis present

## 2017-11-05 DIAGNOSIS — I824Z2 Acute embolism and thrombosis of unspecified deep veins of left distal lower extremity: Secondary | ICD-10-CM | POA: Diagnosis not present

## 2017-11-05 DIAGNOSIS — I10 Essential (primary) hypertension: Secondary | ICD-10-CM | POA: Diagnosis not present

## 2017-11-05 DIAGNOSIS — Z791 Long term (current) use of non-steroidal anti-inflammatories (NSAID): Secondary | ICD-10-CM | POA: Diagnosis not present

## 2017-11-05 DIAGNOSIS — I708 Atherosclerosis of other arteries: Secondary | ICD-10-CM | POA: Diagnosis present

## 2017-11-05 DIAGNOSIS — I739 Peripheral vascular disease, unspecified: Secondary | ICD-10-CM | POA: Diagnosis present

## 2017-11-05 DIAGNOSIS — Z79890 Hormone replacement therapy (postmenopausal): Secondary | ICD-10-CM

## 2017-11-05 DIAGNOSIS — K222 Esophageal obstruction: Secondary | ICD-10-CM | POA: Diagnosis present

## 2017-11-05 DIAGNOSIS — E785 Hyperlipidemia, unspecified: Secondary | ICD-10-CM | POA: Diagnosis present

## 2017-11-05 DIAGNOSIS — Z8249 Family history of ischemic heart disease and other diseases of the circulatory system: Secondary | ICD-10-CM | POA: Diagnosis not present

## 2017-11-05 DIAGNOSIS — R1310 Dysphagia, unspecified: Secondary | ICD-10-CM

## 2017-11-05 DIAGNOSIS — I82412 Acute embolism and thrombosis of left femoral vein: Secondary | ICD-10-CM | POA: Diagnosis not present

## 2017-11-05 DIAGNOSIS — I69354 Hemiplegia and hemiparesis following cerebral infarction affecting left non-dominant side: Secondary | ICD-10-CM

## 2017-11-05 LAB — CBC WITH DIFFERENTIAL/PLATELET
BASOS ABS: 0 10*3/uL (ref 0–0.1)
Basophils Relative: 0 %
EOS PCT: 1 %
Eosinophils Absolute: 0.2 10*3/uL (ref 0–0.7)
HEMATOCRIT: 41.8 % (ref 35.0–47.0)
Hemoglobin: 13.9 g/dL (ref 12.0–16.0)
LYMPHS ABS: 1.8 10*3/uL (ref 1.0–3.6)
LYMPHS PCT: 12 %
MCH: 28.3 pg (ref 26.0–34.0)
MCHC: 33.2 g/dL (ref 32.0–36.0)
MCV: 85.3 fL (ref 80.0–100.0)
MONO ABS: 1.1 10*3/uL — AB (ref 0.2–0.9)
Monocytes Relative: 7 %
NEUTROS ABS: 12 10*3/uL — AB (ref 1.4–6.5)
Neutrophils Relative %: 80 %
PLATELETS: 214 10*3/uL (ref 150–440)
RBC: 4.91 MIL/uL (ref 3.80–5.20)
RDW: 14 % (ref 11.5–14.5)
WBC: 15.1 10*3/uL — ABNORMAL HIGH (ref 3.6–11.0)

## 2017-11-05 LAB — COMPREHENSIVE METABOLIC PANEL
ALT: 14 U/L (ref 0–44)
AST: 23 U/L (ref 15–41)
Albumin: 3.4 g/dL — ABNORMAL LOW (ref 3.5–5.0)
Alkaline Phosphatase: 78 U/L (ref 38–126)
Anion gap: 14 (ref 5–15)
BILIRUBIN TOTAL: 0.6 mg/dL (ref 0.3–1.2)
BUN: 33 mg/dL — ABNORMAL HIGH (ref 8–23)
CHLORIDE: 100 mmol/L (ref 98–111)
CO2: 24 mmol/L (ref 22–32)
CREATININE: 2.05 mg/dL — AB (ref 0.44–1.00)
Calcium: 9.2 mg/dL (ref 8.9–10.3)
GFR, EST AFRICAN AMERICAN: 25 mL/min — AB (ref >60)
GFR, EST NON AFRICAN AMERICAN: 21 mL/min — AB (ref >60)
GLUCOSE: 125 mg/dL — AB (ref 70–99)
Potassium: 4.6 mmol/L (ref 3.5–5.1)
Sodium: 138 mmol/L (ref 135–145)
Total Protein: 7.1 g/dL (ref 6.5–8.1)

## 2017-11-05 LAB — TROPONIN I

## 2017-11-05 LAB — LIPASE, BLOOD: Lipase: 45 U/L (ref 11–51)

## 2017-11-05 MED ORDER — ONDANSETRON HCL 4 MG/2ML IJ SOLN: 4.0000 mg | Freq: Once | INTRAMUSCULAR | Status: DC

## 2017-11-05 MED ORDER — SODIUM CHLORIDE 0.9 % IV BOLUS
1000.0000 mL | Freq: Once | INTRAVENOUS | Status: AC
  Administered 2017-11-05: 1000 mL via INTRAVENOUS

## 2017-11-05 NOTE — ED Provider Notes (Signed)
[facility] Emergency Department Provider Note  ___________________________________________   First MD Initiated Contact with Patient 11/05/17 2214     (approximate)  I have reviewed the triage vital signs and the nursing notes.   HISTORY  Chief Complaint Emesis   HPI Sonya [name] is a 84 y.o. female with a history of CAD, GERD as well as recent admission for intractable nausea and vomiting resolved after bowel movements was presented to the emergency department with recurrent nausea and vomiting.  EMS states that the patient had vomited in route but stopped after being given 4 of Zofran.  Patient denies any abdominal pain.  EMS said that the vomitus was a yellowish-green.  No blood detected.  EMS also reports that the son had stated to them that the patient had 2 bowel movements earlier today.  Patient with left-sided hemiparesis remotely from a stroke.   Past Medical History:  Diagnosis Date  . CAD (coronary artery disease)   . Carpal tunnel syndrome   . GERD (gastroesophageal reflux disease)   . History of shingles   . Hormone receptor positive breast cancer ([place])   . Hyperlipemia   . Hypertension   . Stroke [facility])     Patient Active Problem List   Diagnosis Date Noted  . Choledocholithiasis   . Dysphasia   . Abdominal pain 10/30/2017  . Cerebral embolism with cerebral infarction 10/13/2017  . CVA (cerebral vascular accident) ([place]) 10/13/2017  . Hypertension 10/12/2017  . Hypertensive urgency 10/12/2017  . Hypothyroidism 10/12/2017  . Left-sided weakness 10/12/2017  . Elevated troponin 10/12/2017  . Unknown when suspected stroke patient was last well 10/12/2017    Past Surgical History:  Procedure Laterality Date  . ABDOMINAL HYSTERECTOMY    . APPENDECTOMY    . cornoary angioplasty      Prior to Admission medications   Medication Sig Start Date End Date Taking? Authorizing Provider  acetaminophen (TYLENOL) 325 MG tablet Take 2  tablets (650 mg total) by mouth every 4 (four) hours as needed for mild pain (or temp > 37.5 C (99.5 F)). 10/15/17   Emokpae, Courage, MD  amLODipine (NORVASC) 10 MG tablet Take 1 tablet (10 mg total) by mouth daily. 11/04/17   Salary, Holly Bodily D, MD  aspirin EC 81 MG EC tablet Take 1 tablet (81 mg total) by mouth daily. 11/04/17   Salary, Holly Bodily D, MD  b complex vitamins tablet Take 1 tablet by mouth daily.    [provider]  benazepril (LOTENSIN) 20 MG tablet Take 20 mg by mouth at bedtime.  09/10/17   [provider]  bisacodyl (DUCODYL) 5 MG EC tablet Take 10 mg by mouth daily.    [provider]  Cholecalciferol (VITAMIN D3) 1000 units CAPS Take 3,000 Units by mouth at bedtime.  09/07/17   [provider]  cloNIDine (CATAPRES) 0.2 MG tablet Take 1 tablet (0.2 mg total) by mouth 2 (two) times daily. 10/15/17   Roxan Hockey, MD  ibuprofen (ADVIL,MOTRIN) 400 MG tablet Take 1 tablet (400 mg total) by mouth every 8 (eight) hours as needed for moderate pain. 11/03/17   Salary, Avel Peace, MD  lansoprazole (PREVACID) 15 MG capsule Take 15 mg by mouth at bedtime.     [provider]  levothyroxine (SYNTHROID, LEVOTHROID) 25 MCG tablet Take 25 mcg by mouth daily before breakfast.  09/10/17   [provider]  magic mouthwash SOLN Take 5 mLs by mouth 3 (three) times daily. Swish and spit  [provider]  metoprolol tartrate (LOPRESSOR) 25 MG tablet Take 1 tablet (25 mg total) by mouth 2 (two) times daily. 10/15/17   Roxan Hockey, MD  mupirocin ointment (BACTROBAN) 2 % Place 1 application into the nose 2 (two) times daily. 11/03/17   Salary, Avel Peace, MD  nystatin (MYCOSTATIN) 100000 UNIT/ML suspension Take 5 mLs by mouth 4 (four) times daily.    [provider]  ondansetron (ZOFRAN-ODT) 4 MG disintegrating tablet Take 4 mg by mouth every 8 (eight) hours as needed for nausea or vomiting.    [provider]  polyethylene  glycol (MIRALAX / GLYCOLAX) packet Take 17 g by mouth daily as needed. 11/03/17   Salary, Avel Peace, MD  senna-docusate (SENOKOT-S) 8.6-50 MG tablet Take 2 tablets by mouth at bedtime. 10/15/17   Roxan Hockey, MD  simvastatin (ZOCOR) 40 MG tablet Take 1 tablet (40 mg total) by mouth daily at 6 PM. 10/15/17   Denton Brick, Courage, MD  venlafaxine XR (EFFEXOR-XR) 150 MG 24 hr capsule Take 150 mg by mouth at bedtime. 09/10/17   [provider]    Allergies Clopidogrel and Omeprazole  Family History  Problem Relation Age of Onset  . Hypertension Son   . Ovarian cancer Mother     Social History Social History   Tobacco Use  . Smoking status: Former Research scientist (life sciences)  . Smokeless tobacco: Never Used  Substance Use Topics  . Alcohol use: Not Currently  . Drug use: Never    Review of Systems  Constitutional: No fever/chills Eyes: No visual changes. ENT: No sore throat. Cardiovascular: Denies chest pain. Respiratory: Denies shortness of breath. Gastrointestinal: No abdominal pain. No diarrhea.  No constipation. Genitourinary: Negative for dysuria. Musculoskeletal: Negative for back pain. Skin: Negative for rash. Neurological: Negative for headaches, focal weakness or numbness.   ____________________________________________   PHYSICAL EXAM:  VITAL SIGNS: ED Triage Vitals  Enc Vitals Group     BP 11/05/17 2213 (!) 101/43     Pulse Rate 11/05/17 2213 70     Resp 11/05/17 2213 16     Temp 11/05/17 2213 98.4 F (36.9 C)     Temp Source 11/05/17 2213 Oral     SpO2 11/05/17 2213 96 %     Weight 11/05/17 2214 121 lb 4.1 oz (55 kg)     Height 11/05/17 2214 5' (1.524 m)     Head Circumference --      Peak Flow --      Pain Score 11/05/17 2214 0     Pain Loc --      Pain Edu? --      Excl. in [place]? --     Constitutional: Alert and oriented. Well appearing and in no acute distress. Eyes: Conjunctivae are normal.  Head: Atraumatic. Nose: No congestion/rhinnorhea. Mouth/Throat:  Mucous membranes are moist.  Neck: No stridor.   Cardiovascular: Normal rate, regular rhythm. Grossly normal heart sounds.   Respiratory: Normal respiratory effort.  No retractions. Lungs CTAB. Gastrointestinal: Soft and nontender. No distention. No CVA tenderness. Musculoskeletal: No lower extremity tenderness nor edema.  No joint effusions. Neurologic:  Normal speech and language.  Left-sided weakness which is residual from patient's recent stroke. Skin:  Skin is warm, dry and intact. No rash noted. Psychiatric: Mood and affect are normal. Speech and behavior are normal.  ____________________________________________   LABS (all labs ordered are listed, but only abnormal results are displayed)  Labs Reviewed  CBC WITH DIFFERENTIAL/PLATELET - Abnormal; Notable for the following components:  Result Value   WBC 15.1 (*)    Neutro Abs 12.0 (*)    Monocytes Absolute 1.1 (*)    All other components within normal limits  COMPREHENSIVE METABOLIC PANEL - Abnormal; Notable for the following components:   Glucose, Bld 125 (*)    BUN 33 (*)    Creatinine, Ser 2.05 (*)    Albumin 3.4 (*)    GFR calc non Af Amer 21 (*)    GFR calc Af Amer 25 (*)    All other components within normal limits  LIPASE, BLOOD  TROPONIN I   ____________________________________________  EKG  ED ECG REPORT I, Doran Stabler, the attending physician, personally viewed and interpreted this ECG.   Date: 11/05/2017  EKG Time: 2213  Rate: 70  Rhythm: normal sinus rhythm  Axis: Normal  Intervals:none  ST&T Change: No ST segment elevation or depression.  No abnormal T wave inversion.  ____________________________________________  RADIOLOGY   ____________________________________________   PROCEDURES  Procedure(s) performed:   Procedures  Critical Care performed:   ____________________________________________   INITIAL IMPRESSION / ASSESSMENT AND PLAN / ED COURSE  Pertinent labs &  imaging results that were available during my care of the patient were reviewed by me and considered in my medical decision making (see chart for details).  DDX: Choledocholithiasis, abnormal liver labs, nausea vomiting, viral enteritis, bowel obstruction, cholecystitis As part of my medical decision making, I reviewed the following data within the [facility] Notes from prior ED visits   ----------------------------------------- 10:59 PM on 11/05/2017 -----------------------------------------  Patient no longer vomiting but found to have a creatinine of 2.05 at this time.  This is double her creatinine upon discharge several days ago.  Recurrent vomiting.  She will be readmitted to the hospital.  Patient as well as family understanding and willing to comply.  Signed out to Dr. Jannifer Franklin. ____________________________________________   FINAL CLINICAL IMPRESSION(S) / ED DIAGNOSES  Vomiting.  Acute renal failure.  NEW MEDICATIONS STARTED DURING THIS VISIT:  New Prescriptions   No medications on file     Note:  This document was prepared using Dragon voice recognition software and may include unintentional dictation errors.     Orbie Pyo, MD 11/05/17 [phone]

## 2017-11-05 NOTE — H&P (Signed)
[facility] - [place] at [facility]   PATIENT NAME: Sonya [name]    MR#:  [id number]  DATE OF BIRTH:  09-17-1932  DATE OF ADMISSION:  11/05/2017  PRIMARY CARE PHYSICIAN: Gracelyn Nurse, MD   REQUESTING/REFERRING PHYSICIAN: Pershing Proud, MD  CHIEF COMPLAINT:   Chief Complaint  Patient presents with  . Emesis    HISTORY OF PRESENT ILLNESS:  Sonya [name]  is a 84 y.o. female who presents with chief complaint as above.  Patient came to the ED today for excessive vomiting.  She was recently admitted here and was felt to have possible cholelithiasis.  There were also some questions surrounding possible dysphagia.  She was seen by GI and was decided that she would have outpatient evaluation for EGD and possible ERCP.  Her symptoms improved some and she was discharged.  That was around 48 hours ago.  She states that she was feeling better yesterday and had some relatively solid soft food and seemed to tolerate it well.  However, today she became extremely nauseated and had multiple episodes of vomiting again.  She came back to the ED for evaluation was found to have AKI.  Hospitalist were called for admission  PAST MEDICAL HISTORY:   Past Medical History:  Diagnosis Date  . CAD (coronary artery disease)   . Carpal tunnel syndrome   . GERD (gastroesophageal reflux disease)   . History of shingles   . Hormone receptor positive breast cancer (HCC)   . Hyperlipemia   . Hypertension   . Stroke [facility])      PAST SURGICAL HISTORY:   Past Surgical History:  Procedure Laterality Date  . ABDOMINAL HYSTERECTOMY    . APPENDECTOMY    . cornoary angioplasty       SOCIAL HISTORY:   Social History   Tobacco Use  . Smoking status: Former Games developer  . Smokeless tobacco: Never Used  Substance Use Topics  . Alcohol use: Not Currently     FAMILY HISTORY:   Family History  Problem Relation Age of Onset  . Hypertension Son   . Ovarian cancer Mother       DRUG ALLERGIES:   Allergies  Allergen Reactions  . Clopidogrel Nausea And Vomiting     reported by [facility] 11/24/13  . Omeprazole Other (See Comments)    Unknown reaction - reported by [facility] 11/24/13    MEDICATIONS AT HOME:   Prior to Admission medications   Medication Sig Start Date End Date Taking? Authorizing Provider  acetaminophen (TYLENOL) 325 MG tablet Take 2 tablets (650 mg total) by mouth every 4 (four) hours as needed for mild pain (or temp > 37.5 C (99.5 F)). 10/15/17   Emokpae, Courage, MD  amLODipine (NORVASC) 10 MG tablet Take 1 tablet (10 mg total) by mouth daily. 11/04/17   Salary, Jetty Duhamel D, MD  aspirin EC 81 MG EC tablet Take 1 tablet (81 mg total) by mouth daily. 11/04/17   Salary, Jetty Duhamel D, MD  b complex vitamins tablet Take 1 tablet by mouth daily.    [provider]  benazepril (LOTENSIN) 20 MG tablet Take 20 mg by mouth at bedtime.  09/10/17   [provider]  bisacodyl (DUCODYL) 5 MG EC tablet Take 10 mg by mouth daily.    [provider]  Cholecalciferol (VITAMIN D3) 1000 units CAPS Take 3,000 Units by mouth at bedtime.  09/07/17   [provider]  cloNIDine (CATAPRES) 0.2 MG tablet  Take 1 tablet (0.2 mg total) by mouth 2 (two) times daily. 10/15/17   Shon Hale, MD  ibuprofen (ADVIL,MOTRIN) 400 MG tablet Take 1 tablet (400 mg total) by mouth every 8 (eight) hours as needed for moderate pain. 11/03/17   Salary, Evelena Asa, MD  lansoprazole (PREVACID) 15 MG capsule Take 15 mg by mouth at bedtime.     [provider]  levothyroxine (SYNTHROID, LEVOTHROID) 25 MCG tablet Take 25 mcg by mouth daily before breakfast.  09/10/17   [provider]  magic mouthwash SOLN Take 5 mLs by mouth 3 (three) times daily. Swish and spit    [provider]  metoprolol tartrate (LOPRESSOR) 25 MG tablet Take 1 tablet (25 mg total) by mouth 2 (two) times daily. 10/15/17    Shon Hale, MD  mupirocin ointment (BACTROBAN) 2 % Place 1 application into the nose 2 (two) times daily. 11/03/17   Salary, Evelena Asa, MD  nystatin (MYCOSTATIN) 100000 UNIT/ML suspension Take 5 mLs by mouth 4 (four) times daily.    [provider]  ondansetron (ZOFRAN-ODT) 4 MG disintegrating tablet Take 4 mg by mouth every 8 (eight) hours as needed for nausea or vomiting.    [provider]  polyethylene glycol (MIRALAX / GLYCOLAX) packet Take 17 g by mouth daily as needed. 11/03/17   Salary, Evelena Asa, MD  senna-docusate (SENOKOT-S) 8.6-50 MG tablet Take 2 tablets by mouth at bedtime. 10/15/17   Shon Hale, MD  simvastatin (ZOCOR) 40 MG tablet Take 1 tablet (40 mg total) by mouth daily at 6 PM. 10/15/17   Mariea Clonts, Courage, MD  venlafaxine XR (EFFEXOR-XR) 150 MG 24 hr capsule Take 150 mg by mouth at bedtime. 09/10/17   [provider]    REVIEW OF SYSTEMS:  Review of Systems  Constitutional: Negative for chills, fever, malaise/fatigue and weight loss.  HENT: Negative for ear pain, hearing loss and tinnitus.   Eyes: Negative for blurred vision, double vision, pain and redness.  Respiratory: Negative for cough, hemoptysis and shortness of breath.   Cardiovascular: Negative for chest pain, palpitations, orthopnea and leg swelling.  Gastrointestinal: Positive for nausea and vomiting. Negative for abdominal pain, constipation and diarrhea.  Genitourinary: Negative for dysuria, frequency and hematuria.  Musculoskeletal: Negative for back pain, joint pain and neck pain.  Skin:       No acne, rash, or lesions  Neurological: Negative for dizziness, tremors, focal weakness and weakness.  Endo/Heme/Allergies: Negative for polydipsia. Does not bruise/bleed easily.  Psychiatric/Behavioral: Negative for depression. The patient is not nervous/anxious and does not have insomnia.      VITAL SIGNS:   Vitals:   11/05/17 2213 11/05/17 2214  BP: (!) 101/43   Pulse: 70    Resp: 16   Temp: 98.4 F (36.9 C)   TempSrc: Oral   SpO2: 96%   Weight:  55 kg  Height:  5' (1.524 m)   Wt Readings from Last 3 Encounters:  11/05/17 55 kg  10/30/17 55 kg  10/12/17 56.1 kg    PHYSICAL EXAMINATION:  Physical Exam  Vitals reviewed. Constitutional: She is oriented to person, place, and time. She appears well-developed and well-nourished. No distress.  HENT:  Head: Normocephalic and atraumatic.  Dry mucous membranes  Eyes: Pupils are equal, round, and reactive to light. Conjunctivae and EOM are normal. No scleral icterus.  Neck: Normal range of motion. Neck supple. No JVD present. No thyromegaly present.  Cardiovascular: Normal rate, regular rhythm and intact distal pulses. Exam reveals no  gallop and no friction rub.  No murmur heard. Respiratory: Effort normal and breath sounds normal. No respiratory distress. She has no wheezes. She has no rales.  GI: Soft. Bowel sounds are normal. She exhibits no distension. There is tenderness (Mild, epigastric).  Musculoskeletal: Normal range of motion. She exhibits no edema.  No arthritis, no gout  Lymphadenopathy:    She has no cervical adenopathy.  Neurological: She is alert and oriented to person, place, and time. No cranial nerve deficit.  No dysarthria, no aphasia  Skin: Skin is warm and dry. No rash noted. No erythema.  Psychiatric: She has a normal mood and affect. Her behavior is normal. Judgment and thought content normal.    LABORATORY PANEL:   CBC Recent Labs  Lab 11/05/17 2220  WBC 15.1*  HGB 13.9  HCT 41.8  PLT 214   ------------------------------------------------------------------------------------------------------------------  Chemistries  Recent Labs  Lab 11/05/17 2220  NA 138  K 4.6  CL 100  CO2 24  GLUCOSE 125*  BUN 33*  CREATININE 2.05*  CALCIUM 9.2  AST 23  ALT 14  ALKPHOS 78  BILITOT 0.6    ------------------------------------------------------------------------------------------------------------------  Cardiac Enzymes Recent Labs  Lab 11/05/17 2220  TROPONINI <0.03   ------------------------------------------------------------------------------------------------------------------  RADIOLOGY:  No results found.  EKG:   Orders placed or performed during the hospital encounter of 11/05/17  . ED EKG  . ED EKG  . EKG 12-Lead  . EKG 12-Lead    IMPRESSION AND PLAN:  Principal Problem:   AKI (acute kidney injury) (HCC) -almost certainly prerenal due to her vomiting.  We will hydrate her with IV fluids tonight, avoid nephrotoxins, monitor for expected improvement Active Problems:   Nausea & vomiting -this is likely related to whatever her GI pathology is, which is as of yet incompletely diagnosed.  We will use PRN antiemetics to control her symptoms, hydrate her as above, and get a GI consult for further work-up   Hypertension -continue home medications   Choledocholithiasis -plan was for outpatient EGD and ERCP for evaluation.  Gastroenterologist wanted her to be off high-dose aspirin for about 5 days prior to her procedure.  She was switched to low-dose aspirin at the beginning of her last hospitalization, which was 5 days ago.  GI consult as above to see if they want to proceed with EGD and ERCP while the patient is here, given that her symptoms recurred   Hypothyroidism -home dose thyroid replacement  Chart review performed and case discussed with ED provider. Labs, imaging and/or ECG reviewed by provider and discussed with patient/family. Management plans discussed with the patient and/or family.  DVT PROPHYLAXIS: SubQ heparin  GI PROPHYLAXIS:  PPI   ADMISSION STATUS: Inpatient     CODE STATUS: DNR Code Status History    Date Active Date Inactive Code Status Order ID Comments User Context   10/30/2017 1648 11/03/2017 1703 DNR [id number]  Auburn Bilberry, MD  Inpatient   10/12/2017 2234 10/15/2017 2130 Full Code [id number]  Briscoe Deutscher, MD ED    Questions for Most Recent Historical Code Status (Order [id number])    Question Answer Comment   In the event of cardiac or respiratory ARREST Do not call a "code blue"    In the event of cardiac or respiratory ARREST Do not perform Intubation, CPR, defibrillation or ACLS    In the event of cardiac or respiratory ARREST Use medication by any route, position, wound care, and other measures to relive pain and suffering. May use oxygen, suction  and manual treatment of airway obstruction as needed for comfort.       TOTAL TIME TAKING CARE OF THIS PATIENT: 45 minutes.   Karletta Millay FIELDING 11/05/2017, 11:06 PM  [facility]  Office  [phone]  CC: Primary care physician; Gracelyn Nurse, MD  Note:  This document was prepared using Dragon voice recognition software and may include unintentional dictation errors.

## 2017-11-05 NOTE — ED Notes (Signed)
Pt was given 4 mg of Zofran by EMS prior to arrival.

## 2017-11-05 NOTE — ED Triage Notes (Signed)
Pt arrived from [place] by [facility]. States she has been vomiting today. Recent dx of Gallstones. States she is vomiting greenish bile. Denies abdominal pain. Denies chest pain. Denies shortness of breath.

## 2017-11-06 ENCOUNTER — Encounter: Payer: Self-pay | Admitting: *Deleted

## 2017-11-06 ENCOUNTER — Inpatient Hospital Stay: Payer: Medicare PPO | Admitting: Anesthesiology

## 2017-11-06 ENCOUNTER — Encounter
Admission: EM | Disposition: A | Discharge status: Skilled Nursing Facility | Payer: Self-pay | Source: Skilled Nursing Facility | Attending: Specialist

## 2017-11-06 DIAGNOSIS — R1310 Dysphagia, unspecified: Secondary | ICD-10-CM

## 2017-11-06 DIAGNOSIS — K8050 Calculus of bile duct without cholangitis or cholecystitis without obstruction: Secondary | ICD-10-CM

## 2017-11-06 DIAGNOSIS — K2900 Acute gastritis without bleeding: Secondary | ICD-10-CM

## 2017-11-06 DIAGNOSIS — R4702 Dysphasia: Secondary | ICD-10-CM

## 2017-11-06 DIAGNOSIS — K222 Esophageal obstruction: Secondary | ICD-10-CM

## 2017-11-06 HISTORY — PX: ESOPHAGOGASTRODUODENOSCOPY (EGD) WITH PROPOFOL: SHX5813

## 2017-11-06 LAB — COMPREHENSIVE METABOLIC PANEL
ALBUMIN: 3.2 g/dL — AB (ref 3.5–5.0)
ALT: 13 U/L (ref 0–44)
ANION GAP: 10 (ref 5–15)
AST: 22 U/L (ref 15–41)
Alkaline Phosphatase: 69 U/L (ref 38–126)
BUN: 31 mg/dL — ABNORMAL HIGH (ref 8–23)
CO2: 23 mmol/L (ref 22–32)
Calcium: 8.9 mg/dL (ref 8.9–10.3)
Chloride: 105 mmol/L (ref 98–111)
Creatinine, Ser: 1.67 mg/dL — ABNORMAL HIGH (ref 0.44–1.00)
GFR calc Af Amer: 32 mL/min — ABNORMAL LOW (ref >60)
GFR calc non Af Amer: 27 mL/min — ABNORMAL LOW (ref >60)
GLUCOSE: 100 mg/dL — AB (ref 70–99)
POTASSIUM: 4.7 mmol/L (ref 3.5–5.1)
SODIUM: 138 mmol/L (ref 135–145)
Total Bilirubin: 0.6 mg/dL (ref 0.3–1.2)
Total Protein: 6.5 g/dL (ref 6.5–8.1)

## 2017-11-06 LAB — CBC
HCT: 38.2 % (ref 35.0–47.0)
Hemoglobin: 13 g/dL (ref 12.0–16.0)
MCH: 29.1 pg (ref 26.0–34.0)
MCHC: 34 g/dL (ref 32.0–36.0)
MCV: 85.8 fL (ref 80.0–100.0)
Platelets: 203 10*3/uL (ref 150–440)
RBC: 4.45 MIL/uL (ref 3.80–5.20)
RDW: 13.9 % (ref 11.5–14.5)
WBC: 14.1 10*3/uL — AB (ref 3.6–11.0)

## 2017-11-06 SURGERY — ESOPHAGOGASTRODUODENOSCOPY (EGD) WITH PROPOFOL
Anesthesia: General

## 2017-11-06 MED ORDER — PHENYLEPHRINE HCL 10 MG/ML IJ SOLN
INTRAMUSCULAR | Status: DC | PRN
  Administered 2017-11-06: 100 ug via INTRAVENOUS

## 2017-11-06 MED ORDER — VENLAFAXINE HCL ER 75 MG PO CP24
150.0000 mg | ORAL_CAPSULE | Freq: Every day | ORAL | Status: DC
  Administered 2017-11-07 – 2017-11-15 (×9): 150 mg via ORAL
  Filled 2017-11-06 (×9): qty 2

## 2017-11-06 MED ORDER — LACTULOSE 10 GM/15ML PO SOLN
30.0000 g | Freq: Every day | ORAL | Status: DC
  Administered 2017-11-09: 30 g via ORAL
  Filled 2017-11-06 (×3): qty 60

## 2017-11-06 MED ORDER — ACETAMINOPHEN 650 MG RE SUPP: 650.0000 mg | Freq: Four times a day (QID) | RECTAL | Status: DC | PRN

## 2017-11-06 MED ORDER — LIDOCAINE HCL (PF) 2 % IJ SOLN
INTRAMUSCULAR | Status: AC
  Filled 2017-11-06: qty 10

## 2017-11-06 MED ORDER — FLEET ENEMA 7-19 GM/118ML RE ENEM
1.0000 | ENEMA | Freq: Once | RECTAL | Status: AC
  Administered 2017-11-06: 1 via RECTAL

## 2017-11-06 MED ORDER — SODIUM CHLORIDE 0.9 % IV SOLN
INTRAVENOUS | Status: DC
  Administered 2017-11-06 (×2): via INTRAVENOUS

## 2017-11-06 MED ORDER — ASPIRIN EC 81 MG PO TBEC
81.0000 mg | DELAYED_RELEASE_TABLET | Freq: Every day | ORAL | Status: DC
  Administered 2017-11-09 – 2017-11-16 (×8): 81 mg via ORAL
  Filled 2017-11-06 (×10): qty 1

## 2017-11-06 MED ORDER — PROPOFOL 10 MG/ML IV BOLUS
INTRAVENOUS | Status: AC
  Filled 2017-11-06: qty 20

## 2017-11-06 MED ORDER — DEXTROSE-NACL 5-0.45 % IV SOLN
INTRAVENOUS | Status: AC
  Administered 2017-11-06 – 2017-11-07 (×2): via INTRAVENOUS

## 2017-11-06 MED ORDER — GABAPENTIN 100 MG PO CAPS: 100.0000 mg | ORAL_CAPSULE | Freq: Two times a day (BID) | ORAL | Status: DC

## 2017-11-06 MED ORDER — SIMVASTATIN 40 MG PO TABS
40.0000 mg | ORAL_TABLET | Freq: Every day | ORAL | Status: DC
  Administered 2017-11-08 – 2017-11-16 (×9): 40 mg via ORAL
  Filled 2017-11-06 (×11): qty 1

## 2017-11-06 MED ORDER — MORPHINE SULFATE (PF) 2 MG/ML IV SOLN
2.0000 mg | INTRAVENOUS | Status: DC | PRN
  Administered 2017-11-06 – 2017-11-07 (×2): 2 mg via INTRAVENOUS
  Filled 2017-11-06 (×2): qty 1

## 2017-11-06 MED ORDER — GABAPENTIN 100 MG PO CAPS
100.0000 mg | ORAL_CAPSULE | Freq: Three times a day (TID) | ORAL | Status: DC
  Administered 2017-11-07 – 2017-11-15 (×23): 100 mg via ORAL
  Filled 2017-11-06 (×24): qty 1

## 2017-11-06 MED ORDER — PROCHLORPERAZINE EDISYLATE 10 MG/2ML IJ SOLN
5.0000 mg | INTRAMUSCULAR | Status: DC | PRN
  Filled 2017-11-06: qty 1

## 2017-11-06 MED ORDER — AMITRIPTYLINE HCL 25 MG PO TABS
25.0000 mg | ORAL_TABLET | Freq: Every day | ORAL | Status: DC
  Administered 2017-11-07 – 2017-11-15 (×9): 25 mg via ORAL
  Filled 2017-11-06 (×11): qty 1

## 2017-11-06 MED ORDER — ACETAMINOPHEN 325 MG PO TABS
650.0000 mg | ORAL_TABLET | Freq: Three times a day (TID) | ORAL | Status: DC
  Administered 2017-11-07 – 2017-11-16 (×25): 650 mg via ORAL
  Filled 2017-11-06 (×27): qty 2

## 2017-11-06 MED ORDER — PROPOFOL 10 MG/ML IV BOLUS
INTRAVENOUS | Status: DC | PRN
  Administered 2017-11-06: 20 mg via INTRAVENOUS
  Administered 2017-11-06: 30 mg via INTRAVENOUS
  Administered 2017-11-06: 10 mg via INTRAVENOUS

## 2017-11-06 MED ORDER — PANTOPRAZOLE SODIUM 20 MG PO TBEC
20.0000 mg | DELAYED_RELEASE_TABLET | Freq: Every day | ORAL | Status: DC
  Filled 2017-11-06 (×2): qty 1

## 2017-11-06 MED ORDER — ACETAMINOPHEN 325 MG PO TABS
650.0000 mg | ORAL_TABLET | Freq: Four times a day (QID) | ORAL | Status: DC | PRN
  Filled 2017-11-06: qty 2

## 2017-11-06 MED ORDER — ONDANSETRON HCL 4 MG PO TABS: 4.0000 mg | ORAL_TABLET | Freq: Four times a day (QID) | ORAL | Status: DC | PRN

## 2017-11-06 MED ORDER — LEVOTHYROXINE SODIUM 50 MCG PO TABS
25.0000 ug | ORAL_TABLET | Freq: Every day | ORAL | Status: DC
  Administered 2017-11-07 – 2017-11-16 (×9): 25 ug via ORAL
  Filled 2017-11-06 (×10): qty 1

## 2017-11-06 MED ORDER — ONDANSETRON HCL 4 MG/2ML IJ SOLN
4.0000 mg | Freq: Four times a day (QID) | INTRAMUSCULAR | Status: DC | PRN
  Administered 2017-11-06 – 2017-11-08 (×3): 4 mg via INTRAVENOUS
  Filled 2017-11-06 (×4): qty 2

## 2017-11-06 MED ORDER — HEPARIN SODIUM (PORCINE) 5000 UNIT/ML IJ SOLN
5000.0000 [IU] | Freq: Three times a day (TID) | INTRAMUSCULAR | Status: DC
  Administered 2017-11-06 – 2017-11-11 (×15): 5000 [IU] via SUBCUTANEOUS
  Filled 2017-11-06 (×16): qty 1

## 2017-11-06 NOTE — Anesthesia Preprocedure Evaluation (Addendum)
Anesthesia Evaluation  Patient identified by MRN, date of birth, ID band Patient unresponsive    Reviewed: Allergy & Precautions, H&P , NPO status , Patient's Chart, lab work & pertinent test results  History of Anesthesia Complications Negative for: history of anesthetic complications  Airway Mallampati: III  TM Distance: >3 FB Neck ROM: full    Dental  (+) Chipped   Pulmonary neg shortness of breath, former smoker,           Cardiovascular Exercise Tolerance: Poor hypertension, + CAD and + Peripheral Vascular Disease       Neuro/Psych  Neuromuscular disease CVA (left side), Residual Symptoms negative neurological ROS  negative psych ROS   GI/Hepatic negative GI ROS, Neg liver ROS, GERD  ,  Endo/Other  negative endocrine ROSHypothyroidism   Renal/GU Renal diseasenegative Renal ROS  negative genitourinary   Musculoskeletal   Abdominal   Peds  Hematology negative hematology ROS (+)   Anesthesia Other Findings Patient is NPO appropriate and reports no nausea or vomiting today.    Past Medical History: No date: CAD (coronary artery disease) No date: Carpal tunnel syndrome No date: GERD (gastroesophageal reflux disease) No date: History of shingles No date: Hormone receptor positive breast cancer (HCC) No date: Hyperlipemia No date: Hypertension No date: Stroke [facility])  Past Surgical History: No date: ABDOMINAL HYSTERECTOMY No date: APPENDECTOMY No date: cornoary angioplasty  BMI    Body Mass Index:  23.47 kg/m      Reproductive/Obstetrics negative OB ROS                            Anesthesia Physical Anesthesia Plan  ASA: IV  Anesthesia Plan: General   Post-op Pain Management:    Induction: Intravenous  PONV Risk Score and Plan: Propofol infusion and TIVA  Airway Management Planned: Natural Airway and Nasal Cannula  Additional Equipment:   Intra-op Plan:    Post-operative Plan:   Informed Consent: I have reviewed the patients History and Physical, chart, labs and discussed the procedure including the risks, benefits and alternatives for the proposed anesthesia with the patient or authorized representative who has indicated his/her understanding and acceptance.   Dental Advisory Given  Plan Discussed with: Anesthesiologist, CRNA and Surgeon  Anesthesia Plan Comments: (Patient post stroke and somewhat sedated today.  Family (son and husband) informed that patient is higher risk for complications from anesthesia during this procedure due to their medical history and age including but not limited to post operative cognitive dysfunction.  They voiced understanding.  Plan to suspend DNR for procedure.  Family voiced understanding.  Family consented for risks of anesthesia including but not limited to:  - adverse reactions to medications - risk of intubation if required - damage to teeth, lips or other oral mucosa - sore throat or hoarseness - Damage to heart, brain, lungs or loss of life  They voiced understanding.)       Anesthesia Quick Evaluation

## 2017-11-06 NOTE — Progress Notes (Addendum)
[facility] - [place] at [facility]   PATIENT NAME: Sonya [name]    MR#:  [id number]  DATE OF BIRTH:  1932/12/10  SUBJECTIVE:  CHIEF COMPLAINT:   Chief Complaint  Patient presents with  . Emesis  Patient complains of acute on chronic back pain only, denies back pain, per patient/family-no bowel movement since last discharge from hospital, for EGD by gastroenterology later today  REVIEW OF SYSTEMS:  CONSTITUTIONAL: No fever, fatigue or weakness.  EYES: No blurred or double vision.  EARS, NOSE, AND THROAT: No tinnitus or ear pain.  RESPIRATORY: No cough, shortness of breath, wheezing or hemoptysis.  CARDIOVASCULAR: No chest pain, orthopnea, edema.  GASTROINTESTINAL: No nausea, vomiting, diarrhea or abdominal pain.  GENITOURINARY: No dysuria, hematuria.  ENDOCRINE: No polyuria, nocturia,  HEMATOLOGY: No anemia, easy bruising or bleeding SKIN: No rash or lesion. MUSCULOSKELETAL: No joint pain or arthritis.   NEUROLOGIC: No tingling, numbness, weakness.  PSYCHIATRY: No anxiety or depression.   ROS  DRUG ALLERGIES:   Allergies  Allergen Reactions  . Clopidogrel Nausea And Vomiting     reported by [facility] 11/24/13  . Omeprazole Other (See Comments)    Unknown reaction - reported by [facility] 11/24/13    VITALS:  Blood pressure (!) 108/42, pulse 72, temperature 97.7 F (36.5 C), resp. rate 18, height 5' (1.524 m), weight 54.5 kg, SpO2 90 %.  PHYSICAL EXAMINATION:  GENERAL:  84 y.o.-year-old patient lying in the bed with no acute distress.  EYES: Pupils equal, round, reactive to light and accommodation. No scleral icterus. Extraocular muscles intact.  HEENT: Head atraumatic, normocephalic. Oropharynx and nasopharynx clear.  NECK:  Supple, no jugular venous distention. No thyroid enlargement, no tenderness.  LUNGS: Normal breath sounds bilaterally, no wheezing, rales,rhonchi or crepitation. No use of accessory muscles  of respiration.  CARDIOVASCULAR: S1, S2 normal. No murmurs, rubs, or gallops.  ABDOMEN: Soft, nontender, nondistended. Bowel sounds present. No organomegaly or mass.  EXTREMITIES: No pedal edema, cyanosis, or clubbing.  NEUROLOGIC: Cranial nerves II through XII are intact. Muscle strength 5/5 in all extremities. Sensation intact. Gait not checked.  PSYCHIATRIC: The patient is alert and oriented x 3.  SKIN: No obvious rash, lesion, or ulcer.   Physical Exam LABORATORY PANEL:   CBC Recent Labs  Lab 11/06/17 0320  WBC 14.1*  HGB 13.0  HCT 38.2  PLT 203   ------------------------------------------------------------------------------------------------------------------  Chemistries  Recent Labs  Lab 11/06/17 0320  NA 138  K 4.7  CL 105  CO2 23  GLUCOSE 100*  BUN 31*  CREATININE 1.67*  CALCIUM 8.9  AST 22  ALT 13  ALKPHOS 69  BILITOT 0.6   ------------------------------------------------------------------------------------------------------------------  Cardiac Enzymes Recent Labs  Lab 11/05/17 2220  TROPONINI <0.03   ------------------------------------------------------------------------------------------------------------------  RADIOLOGY:  No results found.  ASSESSMENT AND PLAN:  Patient is 84 year old presenting with emesis, constipation   *Acute emesis  Suspected due to acute constipation, similar to previous admission Per recommendations from last admission-for EGD later today as recommended by gastroenterology/Dr. Servando Snare given concern for ampullary stenosis/stricture/may require ERCP in the future as well  Will give fleets enema, start lactulose daily on tomorrow  *Chronic severe peripheral vascular disease Previous CT noted forsevere atherosclerotic disease involving the aorta and visceral arteries/Suspect significant stenosis involving the SMA and possibly the celiac trunk. Also with severe left scope subclavian stenosis as well as left carotid  artery 50 to 70% stenosis Patient to follow-up with vascular surgery status post discharge  for continued care/surveillance -arrange for last admission  *Essential hypertension Controlled onclonidine,Lotensin,Lopressor, norvasc  *Hypothyroidism  continue Synthroid  *Hyperlipidemia continue Zocor  *Depression continued Effexor  *Acute recent CVA Stable Increase nursing care PRN, aspiration/fall/skin care precautions while in house, dysphagia 1 diet with nectar thickened liquids per last speech therapy evaluation, physical therapy reconsulted  *Chronic dysphasia Secondary to recent CVA Continue dysphagia 1 diet with nectar thickened liquids  *Acute on chronic pain syndrome Adult pain protocol, Neurontin, Elavil, scheduled Tylenol, morphine for severe pain   All the records are reviewed and case discussed with Care Management/Social Workerr. Management plans discussed with the patient, family and they are in agreement.  CODE STATUS: dnr  TOTAL TIME TAKING CARE OF THIS PATIENT: 35 minutes.     POSSIBLE D/C IN 1-2 DAYS, DEPENDING ON CLINICAL CONDITION.   Evelena Asa Nedda Gains M.D on 11/06/2017   Between 7am to 6pm - Pager - [phone]  After 6pm go to [email or web address] - password [email or web address]  [facility]  Office  [phone]  CC: Primary care physician; Gracelyn Nurse, MD  Note: This dictation was prepared with Dragon dictation along with smaller phrase technology. Any transcriptional errors that result from this process are unintentional.

## 2017-11-06 NOTE — Op Note (Signed)
[facility] Gastroenterology Patient Name: Sonya [name] Procedure Date: 11/06/2017 2:22 PM MRN: [id number] Account #: [id number] Date of Birth: 1932-08-27 Admit Type: Inpatient Age: 84 Room: [facility] ENDO ROOM 4 Gender: Female Note Status: Finalized Procedure:            Upper GI endoscopy Indications:          Dysphagia Providers:            Lucilla Lame MD, MD Referring MD:         Baxter Hire, MD (Referring MD) Medicines:            Propofol per Anesthesia Complications:        No immediate complications. Procedure:            Pre-Anesthesia Assessment:                       - Prior to the procedure, a History and Physical was                        performed, and patient medications and allergies were                        reviewed. The patient's tolerance of previous                        anesthesia was also reviewed. The risks and benefits of                        the procedure and the sedation options and risks were                        discussed with the patient. All questions were                        answered, and informed consent was obtained. Prior                        Anticoagulants: The patient has taken no previous                        anticoagulant or antiplatelet agents. ASA Grade                        Assessment: II - A patient with mild systemic disease.                        After reviewing the risks and benefits, the patient was                        deemed in satisfactory condition to undergo the                        procedure.                       After obtaining informed consent, the endoscope was                        passed under direct vision. Throughout the procedure,  the patient's blood pressure, pulse, and oxygen                        saturations were monitored continuously. The Endoscope                        was introduced through the mouth, and advanced to the   second part of duodenum. The upper GI endoscopy was                        accomplished without difficulty. The patient tolerated                        the procedure well. Findings:      A large hiatal hernia was present.      One benign-appearing, intrinsic moderate stenosis was found at the       gastroesophageal junction. The stenosis was traversed. A TTS dilator was       passed through the scope. Dilation with a 15-16.5-18 mm balloon dilator       was performed to 18 mm. The dilation site was examined following       endoscope reinsertion and showed complete resolution of luminal       narrowing.      Localized moderate inflammation characterized by erythema was found in       the gastric antrum.      The examined duodenum was normal. Impression:           - Large hiatal hernia.                       - Benign-appearing esophageal stenosis. Dilated.                       - Gastritis.                       - Normal examined duodenum.                       - No specimens collected. Recommendation:       - Return patient to hospital ward for ongoing care.                       - Resume previous diet.                       - Continue present medications. Procedure Code(s):    --- Professional ---                       [phone], Esophagogastroduodenoscopy, flexible, transoral;                        with transendoscopic balloon dilation of esophagus                        (less than 30 mm diameter) Diagnosis Code(s):    --- Professional ---                       R13.10, Dysphagia, unspecified                       K29.70, Gastritis, unspecified, without  bleeding                       K22.2, Esophageal obstruction CPT copyright 2017 American Medical Association. All rights reserved. The codes documented in this report are preliminary and upon coder review may  be revised to meet current compliance requirements. Lucilla Lame MD, MD 11/06/2017 2:33:00 PM This report has been signed  electronically. Number of Addenda: 0 Note Initiated On: 11/06/2017 2:22 PM      [facility]

## 2017-11-06 NOTE — Anesthesia Postprocedure Evaluation (Signed)
Anesthesia Post Note  Patient: Sonya [name]  Procedure(s) Performed: ESOPHAGOGASTRODUODENOSCOPY (EGD) WITH PROPOFOL (N/A )  Patient location during evaluation: Endoscopy Anesthesia Type: General Level of consciousness: awake and alert Pain management: pain level controlled Vital Signs Assessment: post-procedure vital signs reviewed and stable Respiratory status: spontaneous breathing, nonlabored ventilation, respiratory function stable and patient connected to nasal cannula oxygen Cardiovascular status: blood pressure returned to baseline and stable Postop Assessment: no apparent nausea or vomiting Anesthetic complications: no     Last Vitals:  Vitals:   11/06/17 1445 11/06/17 1505  BP: (!) 98/41 (!) 113/44  Pulse: 68 70  Resp: 12 13  Temp:    SpO2: 99% 94%    Last Pain:  Vitals:   11/06/17 1505  TempSrc:   PainSc: Asleep                 Precious Haws Piscitello

## 2017-11-06 NOTE — Clinical Social Work Note (Signed)
Clinical Social Work Assessment  Patient Details  Name: Sonya [name] MRN: [id number] Date of Birth: Mar 20, 1932  Date of referral:  11/06/17               Reason for consult:  Discharge Planning                Permission sought to share information with:    Permission granted to share information::     Name::        Agency::     Relationship::     Contact Information:     Housing/Transportation Living arrangements for the past 2 months:  [place] of Information:  Adult Children Patient Interpreter Needed:  None Criminal Activity/Legal Involvement Pertinent to Current Situation/Hospitalization:  No - Comment as needed Significant Relationships:  Adult Children, Spouse Lives with:  Facility Resident Do you feel safe going back to the place where you live?  Yes Need for family participation in patient care:  Yes (Comment)  Care giving concerns:  Patient has been at [facility] for short term rehab.    Social Worker assessment / plan:  CSW informed that patient's mentation was somewhat better this admission than it was several days ago when she was discharged. On the day of discharge it had been relayed by staff that patient's family wanted her to go to another facility. CSW was not able to accommodate this on the day of discharge. CSW contacted patient's son (patient was sleeping soundly at time of assessment) and inquired if the family truly wanted another facility as we could go ahead and begin a new bed search. Patient's son: Sonya [name]: [phone] informed CSW that the family did want patient to return to [facility]   CSW spoke with Liliane Channel at [facility] and they can take patient back once authorization has been received.  Employment status:    Insurance information:    PT Recommendations:    Information / Referral to community resources:     Patient/Family's Response to care:  Patient's son expressed appreciation for CSW assistance.  Patient/Family's  Understanding of and Emotional Response to Diagnosis, Current Treatment, and Prognosis:  Patient's son has been at bedside a lot of the time since admission but had gone home this afternoon while patient has her procedure for today. Patient's son seemed in good spirits on the phone.  Emotional Assessment Appearance:  Appears stated age Attitude/Demeanor/Rapport:  (sleeping at time of assessment) Affect (typically observed):    Orientation:    Alcohol / Substance use:  Not Applicable Psych involvement (Current and /or in the community):  No (Comment)  Discharge Needs  Concerns to be addressed:  Care Coordination Readmission within the last 30 days:  No Current discharge risk:  None Barriers to Discharge:  [place], [place] 11/06/2017, 2:12 PM

## 2017-11-06 NOTE — Transfer of Care (Signed)
Immediate Anesthesia Transfer of Care Note  Patient: Sonya [name]  Procedure(s) Performed: ESOPHAGOGASTRODUODENOSCOPY (EGD) WITH PROPOFOL (N/A )  Patient Location: PACU  Anesthesia Type:General  Level of Consciousness: sedated  Airway & Oxygen Therapy: Patient Spontanous Breathing and Patient connected to nasal cannula oxygen  Post-op Assessment: Report given to RN and Post -op Vital signs reviewed and stable  Post vital signs: Reviewed and stable  Last Vitals:  Vitals Value Taken Time  BP 118/53 11/06/2017  2:39 PM  Temp 36.1 C 11/06/2017  2:35 PM  Pulse 66 11/06/2017  2:39 PM  Resp 12 11/06/2017  2:39 PM  SpO2 98 % 11/06/2017  2:39 PM  Vitals shown include unvalidated device data.  Last Pain:  Vitals:   11/06/17 1435  TempSrc: Tympanic  PainSc: Asleep         Complications: No apparent anesthesia complications

## 2017-11-06 NOTE — NC FL2 (Signed)
[place] LEVEL OF CARE SCREENING TOOL     IDENTIFICATION  Patient Name: Sonya [name] Birthdate: [birth year] Sex: female Admission Date (Current Location): 11/05/2017  [facility] and [place] Number:  Engineering geologist and Address:         Provider Number: [id number]  Attending Physician Name and Address:  Gorden Harms, MD  Relative Name and Phone Number:       Current Level of Care: Hospital Recommended Level of Care: [place] Prior Approval Number:    Date Approved/Denied:   PASRR Number:    Discharge Plan: SNF    Current Diagnoses: Patient Active Problem List   Diagnosis Date Noted  . AKI (acute kidney injury) ([place]) 11/05/2017  . Nausea & vomiting 11/05/2017  . Choledocholithiasis   . Dysphasia   . Abdominal pain 10/30/2017  . Cerebral embolism with cerebral infarction 10/13/2017  . CVA (cerebral vascular accident) ([place]) 10/13/2017  . Hypertension 10/12/2017  . Hypertensive urgency 10/12/2017  . Hypothyroidism 10/12/2017  . Left-sided weakness 10/12/2017  . Elevated troponin 10/12/2017  . Unknown when suspected stroke patient was last well 10/12/2017    Orientation RESPIRATION BLADDER Height & Weight     Self, Place  Normal Incontinent Weight: 120 lb 2.4 oz (54.5 kg) Height:  5' (152.4 cm)  BEHAVIORAL SYMPTOMS/MOOD NEUROLOGICAL BOWEL NUTRITION STATUS  (none) (none) Incontinent Diet  AMBULATORY STATUS COMMUNICATION OF NEEDS Skin   Extensive Assist Verbally Normal                       Personal Care Assistance Level of Assistance  Bathing, Dressing, Feeding Bathing Assistance: Maximum assistance Feeding assistance: Limited assistance Dressing Assistance: Maximum assistance     Functional Limitations Info  (no issues)          SPECIAL CARE FACTORS FREQUENCY  PT (By licensed PT)                    Contractures Contractures Info: Not present    Additional Factors Info  Code Status Code  Status Info: dnr Allergies Info: clopidogrel; omeprazole           Current Medications (11/06/2017):  This is the current hospital active medication list Current Facility-Administered Medications  Medication Dose Route Frequency Provider Last Rate Last Dose  . acetaminophen (TYLENOL) tablet 650 mg  650 mg Oral Q6H PRN Lance Coon, MD       Or  . acetaminophen (TYLENOL) suppository 650 mg  650 mg Rectal Q6H PRN Lance Coon, MD      . amitriptyline (ELAVIL) tablet 25 mg  25 mg Oral QHS Salary, Montell D, MD      . aspirin EC tablet 81 mg  81 mg Oral Daily Lance Coon, MD      . dextrose 5 %-0.45 % sodium chloride infusion   Intravenous Continuous Salary, Montell D, MD 125 mL/hr at 11/06/17 1048    . gabapentin (NEURONTIN) capsule 100 mg  100 mg Oral BID Salary, Montell D, MD      . heparin injection 5,000 Units  5,000 Units Subcutaneous Camelia Phenes Lance Coon, MD   5,000 Units at 11/06/17 [phone]  . levothyroxine (SYNTHROID, LEVOTHROID) tablet 25 mcg  25 mcg Oral QAC breakfast Lance Coon, MD      . morphine 2 MG/ML injection 2 mg  2 mg Intravenous Q2H PRN Salary, Montell D, MD   2 mg at 11/06/17 1046  . ondansetron (ZOFRAN) injection 4 mg  4 mg Intravenous Once Lance Coon, MD   Stopped at 11/05/17 2233  . ondansetron (ZOFRAN) tablet 4 mg  4 mg Oral Q6H PRN Lance Coon, MD       Or  . ondansetron [facility]) injection 4 mg  4 mg Intravenous Q6H PRN Lance Coon, MD   4 mg at 11/06/17 0158  . pantoprazole (PROTONIX) EC tablet 20 mg  20 mg Oral Daily Lance Coon, MD      . prochlorperazine (COMPAZINE) injection 5 mg  5 mg Intravenous Q4H PRN Lance Coon, MD      . simvastatin (ZOCOR) tablet 40 mg  40 mg Oral q1800 Lance Coon, MD      . sodium phosphate (FLEET) 7-19 GM/118ML enema 1 enema  1 enema Rectal Once Salary, Montell D, MD      . venlafaxine XR (EFFEXOR-XR) 24 hr capsule 150 mg  150 mg Oral QHS Lance Coon, MD         Discharge Medications: Please see discharge summary  for a list of discharge medications.  Relevant Imaging Results:  Relevant Lab Results:   Additional Information ss: [id number]  Shela Leff, LCSW

## 2017-11-06 NOTE — Consult Note (Signed)
Sonya Lame, MD [facility]  [address], [place], [place] [address] Phone: [phone] Fax : [phone]  Consultation  Referring Provider:     Dr. Jannifer Franklin Primary Care Physician:  Baxter Hire, MD Primary Gastroenterologist:  Dr. Allen Norris        Reason for Consultation:     Dysphasia and come bile duct stones  Date of Admission:  11/05/2017 Date of Consultation:  11/06/2017         HPI:   Sonya [name] is a 84 y.o. female who was recently discharged with a history of a stroke and dysphasia which has been stable since her stroke.  The patient also was recently admitted for the dysphasia and had a finding of common bile duct stones that were nonobstructing nor whether the liver enzymes abnormal.  The patient was sent home and started to have some nausea vomiting and was brought back into the hospital yesterday whereupon the patient was admitted for nausea and vomiting that resolved after she had moved her bowels.  The patient also had a normal lipase with a CBC showing an increased white blood cell count at 15.1 which was 14.1 today.  The hemoglobin was normal.  As stated above the patient's liver enzymes were also normal  Past Medical History:  Diagnosis Date  . CAD (coronary artery disease)   . Carpal tunnel syndrome   . GERD (gastroesophageal reflux disease)   . History of shingles   . Hormone receptor positive breast cancer ([place])   . Hyperlipemia   . Hypertension   . Stroke [facility])     Past Surgical History:  Procedure Laterality Date  . ABDOMINAL HYSTERECTOMY    . APPENDECTOMY    . cornoary angioplasty      Prior to Admission medications   Medication Sig Start Date End Date Taking? Authorizing Provider  acetaminophen (TYLENOL) 325 MG tablet Take 2 tablets (650 mg total) by mouth every 4 (four) hours as needed for mild pain (or temp > 37.5 C (99.5 F)). 10/15/17  Yes Emokpae, Courage, MD  amLODipine (NORVASC) 10 MG tablet Take 1 tablet (10 mg total) by mouth daily.  11/04/17  Yes Salary, Avel Peace, MD  aspirin EC 81 MG EC tablet Take 1 tablet (81 mg total) by mouth daily. 11/04/17  Yes Salary, Montell D, MD  b complex vitamins tablet Take 1 tablet by mouth daily.   Yes [provider]  benazepril (LOTENSIN) 20 MG tablet Take 20 mg by mouth at bedtime.  09/10/17  Yes [provider]  bisacodyl (DUCODYL) 5 MG EC tablet Take 10 mg by mouth daily.   Yes [provider]  Cholecalciferol (VITAMIN D3) 1000 units CAPS Take 3,000 Units by mouth at bedtime.  09/07/17  Yes [provider]  cloNIDine (CATAPRES) 0.2 MG tablet Take 1 tablet (0.2 mg total) by mouth 2 (two) times daily. 10/15/17  Yes Emokpae, Courage, MD  ibuprofen (ADVIL,MOTRIN) 400 MG tablet Take 1 tablet (400 mg total) by mouth every 8 (eight) hours as needed for moderate pain. 11/03/17  Yes Salary, Avel Peace, MD  lansoprazole (PREVACID) 15 MG capsule Take 15 mg by mouth at bedtime.    Yes [provider]  levothyroxine (SYNTHROID, LEVOTHROID) 25 MCG tablet Take 25 mcg by mouth daily before breakfast.  09/10/17  Yes [provider]  magic mouthwash SOLN Take 5 mLs by mouth 3 (three) times daily. Swish and spit   Yes [provider]  metoprolol tartrate (LOPRESSOR) 25  MG tablet Take 1 tablet (25 mg total) by mouth 2 (two) times daily. 10/15/17  Yes Emokpae, Courage, MD  nystatin (MYCOSTATIN) 100000 UNIT/ML suspension Take 5 mLs by mouth 4 (four) times daily.   Yes [provider]  ondansetron (ZOFRAN-ODT) 4 MG disintegrating tablet Take 4 mg by mouth every 8 (eight) hours as needed for nausea or vomiting.   Yes [provider]  senna-docusate (SENOKOT-S) 8.6-50 MG tablet Take 2 tablets by mouth at bedtime. 10/15/17  Yes Roxan Hockey, MD  simvastatin (ZOCOR) 40 MG tablet Take 1 tablet (40 mg total) by mouth daily at 6 PM. 10/15/17  Yes Emokpae, Courage, MD  venlafaxine XR (EFFEXOR-XR) 150 MG 24 hr capsule Take 150 mg by mouth at  bedtime. 09/10/17  Yes [provider]    Family History  Problem Relation Age of Onset  . Hypertension Son   . Ovarian cancer Mother      Social History   Tobacco Use  . Smoking status: Former Research scientist (life sciences)  . Smokeless tobacco: Never Used  Substance Use Topics  . Alcohol use: Not Currently  . Drug use: Never    Allergies as of 11/05/2017 - Review Complete 11/05/2017  Allergen Reaction Noted  . Clopidogrel Nausea And Vomiting 10/12/2017  . Omeprazole Other (See Comments) 10/12/2017    Review of Systems:    All systems reviewed and negative except where noted in HPI.   Physical Exam:  Vital signs in last 24 hours: Temp:  [97.7 F (36.5 C)-98.5 F (36.9 C)] 97.7 F (36.5 C) (09/20 1236) Pulse Rate:  [63-72] 72 (09/20 1236) Resp:  [16-18] 18 (09/20 0439) BP: (101-148)/(40-49) 108/42 (09/20 1236) SpO2:  [90 %-99 %] 90 % (09/20 1236) Weight:  [54.5 kg-55 kg] 54.5 kg (09/20 0035)   General:   Pleasant, cooperative in NAD Head:  Normocephalic and atraumatic. Eyes:   No icterus.   Conjunctiva pink. PERRLA. Ears:  Normal auditory acuity. Neck:  Supple; no masses or thyroidomegaly Lungs: Respirations even and unlabored. Lungs clear to auscultation bilaterally.   No wheezes, crackles, or rhonchi.  Heart:  Regular rate and rhythm;  Without murmur, clicks, rubs or gallops Abdomen:  Soft, nondistended, nontender. Normal bowel sounds. No appreciable masses or hepatomegaly.  No rebound or guarding.  Rectal:  Not performed. Msk:  Symmetrical without gross deformities.    Extremities:  Without edema, cyanosis or clubbing. Neurologic:  Alert and oriented x3;  grossly normal neurologically. Skin:  Intact without significant lesions or rashes. Cervical Nodes:  No significant cervical adenopathy. Psych:  Alert and cooperative. Normal affect.  LAB RESULTS: Recent Labs    11/05/17 2220 11/06/17 0320  WBC 15.1* 14.1*  HGB 13.9 13.0  HCT 41.8 38.2  PLT 214 203   BMET Recent  Labs    11/05/17 2220 11/06/17 0320  NA 138 138  K 4.6 4.7  CL 100 105  CO2 24 23  GLUCOSE 125* 100*  BUN 33* 31*  CREATININE 2.05* 1.67*  CALCIUM 9.2 8.9   LFT Recent Labs    11/06/17 0320  PROT 6.5  ALBUMIN 3.2*  AST 22  ALT 13  ALKPHOS 69  BILITOT 0.6   PT/INR No results for input(s): LABPROT, INR in the last 72 hours.  STUDIES: No results found.    Impression / Plan:   Assessment: Principal Problem:   AKI (acute kidney injury) ([place]) Active Problems:   Hypertension   Hypothyroidism   Choledocholithiasis   Nausea & vomiting   Sonya [name]  is a 84 y.o. y/o female with a history of bile duct stones who was admitted with nausea vomiting and acute renal insufficiency.  The patient was noted to have bile duct stones but was on a 325 mg aspirin during her last admission and has since been put down to 81 mg.  The patient has no signs of biliary obstruction.  Plan:  The patient will be set up for an EGD for today to look for any sign of her dysphasia.  The differential diagnosis includes that it may be from her stroke and she may have some dysphasia due to a history of esophageal narrowing with dilation.  The ERCP will be held off for now due to the patient having increased white cell count and not being optimized at the present time.  The patient can have the ERCP on Monday or Tuesday of next week if she is still here otherwise as an outpatient.  It is unlikely that her nonobstructing bile duct stones with normal liver enzymes are causing her present illness.  I did send off a urinalysis in this patient and other sources of infection should be entertained.  Thank you for involving me in the care of this patient.      LOS: 1 day   Sonya Lame, MD  11/06/2017, 2:34 PM    Note: This dictation was prepared with Dragon dictation along with smaller phrase technology. Any transcriptional errors that result from this process are unintentional.

## 2017-11-06 NOTE — Anesthesia Post-op Follow-up Note (Signed)
Anesthesia QCDR form completed.        

## 2017-11-07 LAB — URINALYSIS, ROUTINE W REFLEX MICROSCOPIC
Bilirubin Urine: NEGATIVE
GLUCOSE, UA: NEGATIVE mg/dL
Hgb urine dipstick: NEGATIVE
Ketones, ur: NEGATIVE mg/dL
LEUKOCYTES UA: NEGATIVE
Nitrite: NEGATIVE
PH: 5 (ref 5.0–8.0)
Protein, ur: NEGATIVE mg/dL
SPECIFIC GRAVITY, URINE: 1.005 (ref 1.005–1.030)

## 2017-11-07 MED ORDER — PANTOPRAZOLE SODIUM 40 MG PO TBEC
40.0000 mg | DELAYED_RELEASE_TABLET | Freq: Two times a day (BID) | ORAL | Status: DC
  Administered 2017-11-08: 40 mg via ORAL
  Filled 2017-11-07 (×2): qty 1

## 2017-11-07 MED ORDER — SODIUM CHLORIDE 0.9 % IV SOLN
INTRAVENOUS | Status: DC
  Administered 2017-11-07 – 2017-11-12 (×7): via INTRAVENOUS

## 2017-11-07 NOTE — Progress Notes (Signed)
[place] at [place] NAME: Sonya [name]    MR#:  [id number]  DATE OF BIRTH:  Jul 14, 1932  SUBJECTIVE:   Patient presented to the hospital due to nausea, abdominal pain.  Status post endoscopy showing a benign esophageal stricture which was dilated with a large hiatal hernia.  Patient still having some nausea and belly pain today.  Tolerating her clear liquid diets well renal function has improved and will advance diet today.  REVIEW OF SYSTEMS:    Review of Systems  Constitutional: Negative for chills and fever.  HENT: Negative for congestion and tinnitus.   Eyes: Negative for blurred vision and double vision.  Respiratory: Negative for cough, shortness of breath and wheezing.   Cardiovascular: Negative for chest pain, orthopnea and PND.  Gastrointestinal: Positive for abdominal pain and nausea. Negative for diarrhea and vomiting.  Genitourinary: Negative for dysuria and hematuria.  Neurological: Negative for dizziness, sensory change and focal weakness.  All other systems reviewed and are negative.   Nutrition: Soft diet Tolerating Diet: yes Tolerating PT: Eval noted.   DRUG ALLERGIES:   Allergies  Allergen Reactions  . Clopidogrel Nausea And Vomiting     reported by [place] 11/24/13  . Omeprazole Other (See Comments)    Unknown reaction - reported by [place] 11/24/13    VITALS:  Blood pressure (!) 160/50, pulse (!) 105, temperature 98.2 F (36.8 C), temperature source Oral, resp. rate 18, height 5' (1.524 m), weight 54.5 kg, SpO2 94 %.  PHYSICAL EXAMINATION:   Physical Exam  GENERAL:  84 y.o.-year-old patient sitting up in bed in no acute distress.  EYES: Pupils equal, round, reactive to light and accommodation. No scleral icterus. Extraocular muscles intact.  HEENT: Head atraumatic, normocephalic. Oropharynx and nasopharynx clear.  NECK:  Supple, no jugular venous distention. No  thyroid enlargement, no tenderness.  LUNGS: Normal breath sounds bilaterally, no wheezing, rales, rhonchi. No use of accessory muscles of respiration.  CARDIOVASCULAR: S1, S2 normal. No murmurs, rubs, or gallops.  ABDOMEN: Soft, nontender, nondistended. Bowel sounds present. No organomegaly or mass.  EXTREMITIES: No cyanosis, clubbing or edema b/l.    NEUROLOGIC: Cranial nerves II through XII are intact. No focal Motor or sensory deficits b/l. Globally weak  PSYCHIATRIC: The patient is alert and oriented x 2.  SKIN: No obvious rash, lesion, or ulcer.    LABORATORY PANEL:   CBC Recent Labs  Lab 11/06/17 0320  WBC 14.1*  HGB 13.0  HCT 38.2  PLT 203   ------------------------------------------------------------------------------------------------------------------  Chemistries  Recent Labs  Lab 11/06/17 0320  NA 138  K 4.7  CL 105  CO2 23  GLUCOSE 100*  BUN 31*  CREATININE 1.67*  CALCIUM 8.9  AST 22  ALT 13  ALKPHOS 69  BILITOT 0.6   ------------------------------------------------------------------------------------------------------------------  Cardiac Enzymes Recent Labs  Lab 11/05/17 2220  TROPONINI <0.03   ------------------------------------------------------------------------------------------------------------------  RADIOLOGY:  No results found.   ASSESSMENT AND PLAN:   84 year old female with past medical history of breast cancer, stroke, hypertension, hyperlipidemia and GERD, history of coronary artery disease who presented to the hospital due to abdominal pain and nausea.  1.  Abdominal pain/nausea- suspected to be secondary to underlying constipation combined with gastritis/esophageal stricture.  Patient was seen by gastroenterology underwent an upper GI endoscopy and underwent dilatation of the GE junction stenosis. - Continue supportive care with PPI twice daily, patient tolerating clear liquids and will advance to soft diet today.  2.  Acute  kidney injury-secondary to dehydration from ongoing nausea and abdominal pain. -Improved with IV fluid hydration we will continue to monitor.  Renal dose meds, avoid nephrotoxins.  3. Constipation - improving. Cont. Lactulose, and received Enema yesterday.   4.  Hypothyroidism-continue Synthroid.  5.  Neuropathy-continue gabapentin.  6.  Hyperlipidemia-continue simvastatin.  7.  Choledocholithiasis-no acute obstruction presently.  Patient would benefit from an ERCP but this can be done as an outpatient.  Patient's LFTs are stable, bilirubins, alkaline phosphatase are stable.  8. Depression - cont. Effexor.    All the records are reviewed and case discussed with Care Management/Social Worker. Management plans discussed with the patient, family and they are in agreement.  CODE STATUS: DNR  DVT Prophylaxis: Hep SQ  TOTAL TIME TAKING CARE OF THIS PATIENT: 30 minutes.   POSSIBLE D/C IN 1-2 DAYS, DEPENDING ON CLINICAL CONDITION.   Henreitta Leber M.D on 11/07/2017 at 2:12 PM  Between 7am to 6pm - Pager - [phone]  After 6pm go to [email or web address] - Proofreader  [facility]  Office  [phone]  CC: Primary care physician; Baxter Hire, MD

## 2017-11-07 NOTE — Evaluation (Signed)
Physical Therapy Evaluation Patient Details Name: Sonya [name] MRN: [id number] DOB: [birth year] Today's Date: 11/07/2017   History of Present Illness  pt is a 84 y.o F admitted on 11/05/2017 with dx of acute kidney injury and CC of nausea and vomiting. She was previously discharged 48 hours ago with dx of cholelithiasis and chronic vomitting. She has a hx stroke, HTN, and currently reports chronic low back pain.   Clinical Impression  Pt currently laying in bed with husband and CNA when PT entered the room. Pt reports pain in the low back at 5/10. She has a hx of a stroke with LUE/LE weakness. LLE overall weakness in 3/5, and RLE is 4/5. She requires max assist with bed mobility and max verbal cues on RUE/LE assist to EOB. While sitting on EOB she demonstrate pushing to the L and required max assist for maintaining sitting balance. MD and nursing came and assessed pt during PT evaluation. Due to pt feeling fatigued and providing limited assistance as well as vomiting while sitting on EOB she was returned bed and nursing notified. She would benefit from physical therapy to increase bed mobility, balance, transfers and gait. She is being followed by [facility] and plans to return following discharge to resume physical therapy.    Follow Up Recommendations SNF    Equipment Recommendations  (RW)    Recommendations for Other Services OT consult     Precautions / Restrictions Precautions Precautions: Fall Restrictions Weight Bearing Restrictions: No      Mobility  Bed Mobility Overal bed mobility: Needs Assistance Bed Mobility: Rolling;Supine to Sit;Sit to Supine Rolling: Max assist Sidelying to sit: Max assist Supine to sit: Max assist Sit to supine: Max assist   General bed mobility comments: once sitting on EOB she demonstrates pushing to the L and required Max assist for seated balance  Transfers                    Ambulation/Gait                Stairs             Wheelchair Mobility    Modified Rankin (Stroke Patients Only)       Balance Overall balance assessment: Needs assistance Sitting-balance support: (RUE support but required max verbal cues for use)   Sitting balance - Comments: pushing behaviors towards L                                     Pertinent Vitals/Pain Pain Assessment: 0-10 Pain Score: 5  Pain Location: chronic back pain Pain Descriptors / Indicators: Aching;Sore Pain Intervention(s): Limited activity within patient's tolerance    Home Living Family/patient expects to be discharged to:: Skilled nursing facility   Available Help at Discharge: Family Type of Home: House(limited information from pt )                Prior Function Level of Independence: Independent with assistive device(s)         Comments: At baseline, mod indep with assist device for ADLs, household mobility; since CVA (approx 2 weeks prior), requiring up to max assist +2 for all functional      Hand Dominance   Dominant Hand: Right    Extremity/Trunk Assessment   Upper Extremity Assessment Upper Extremity Assessment: Defer to OT evaluation    Lower Extremity Assessment Lower Extremity Assessment: RLE deficits/detail;LLE deficits/detail RLE  Deficits / Details: AROM WFL, strength at least 4/5 LLE Deficits / Details: ankle active ROM with 3/5 strength, and knee 3/5       Communication   Communication: Expressive difficulties  Cognition Arousal/Alertness: Lethargic;Awake/alert Behavior During Therapy: [facility]: Lambert Rhonda W for tasks assessed/performed   Area of Impairment: Attention;Awareness                                      General Comments      Exercises     Assessment/Plan    PT Assessment Patient needs continued PT services  PT Problem List Decreased strength;Decreased mobility;Decreased activity tolerance;Decreased balance;Decreased knowledge of use of DME;Decreased knowledge of  precautions;Decreased safety awareness;Decreased range of motion;Decreased coordination;Impaired tone;Pain       PT Treatment Interventions DME instruction;Therapeutic activities;Patient/family education;Therapeutic exercise;Balance training;Neuromuscular re-education;Functional mobility training;Gait training    PT Goals (Current goals can be found in the Care Plan section)  Acute Rehab PT Goals Patient Stated Goal: to return to rehab to complete therapy PT Goal Formulation: With patient/family Time For Goal Achievement: 11/16/17 Potential to Achieve Goals: Fair    Frequency Min 2X/week   Barriers to discharge Decreased caregiver support      Co-evaluation               AM-PAC PT "6 Clicks" Daily Activity  Outcome Measure Difficulty turning over in bed (including adjusting bedclothes, sheets and blankets)?: Unable Difficulty moving from lying on back to sitting on the side of the bed? : Unable Difficulty sitting down on and standing up from a chair with arms (e.g., wheelchair, bedside commode, etc,.)?: Unable Help needed moving to and from a bed to chair (including a wheelchair)?: Total Help needed walking in hospital room?: Total Help needed climbing 3-5 steps with a railing? : Total 6 Click Score: 6    End of Session Equipment Utilized During Treatment: Gait belt Activity Tolerance: Patient limited by fatigue;Patient limited by lethargy;Other (comment)(pt vomitted during while sitting EOB) Patient left: in bed;with bed alarm set;with nursing/sitter in room Nurse Communication: Mobility status;Other (comment)(pt required clean up due to vomitting) PT Visit Diagnosis: Repeated falls (R29.6);Muscle weakness (generalized) (M62.81);History of falling (Z91.81);Pain;Hemiplegia and hemiparesis;Unsteadiness on feet (R26.81);Other abnormalities of gait and mobility (R26.89) Hemiplegia - Right/Left: Left Hemiplegia - dominant/non-dominant: Non-dominant Hemiplegia - caused by:  Cerebral infarction Pain - part of body: (low back)    Time: 5462-7035 PT Time Calculation (min) (ACUTE ONLY): 29 min   Charges:   PT Evaluation $PT Eval Moderate Complexity: 1 Mod          Myanna Ziesmer PT, DPT, LAT, ATC  11/07/17  9:59 AM       Shellby Schlink 11/07/2017, 9:53 AM

## 2017-11-07 NOTE — Progress Notes (Signed)
Sonya Darby, MD [address]  [place]  [place], [place] [address]  Main: [phone]  Fax: [phone] Pager: [phone]   Subjective: Patient reports burning in her chest. Underwent EGD with dilation yesterday   Objective: Vital signs in last 24 hours: Vitals:   11/06/17 1624 11/06/17 1942 11/07/17 0408 11/07/17 0514  BP: (!) 154/55 (!) 144/40 (!) 203/77 (!) 160/50  Pulse: 73 80 (!) 105   Resp:  20 18   Temp: 98.1 F (36.7 C) 97.6 F (36.4 C) 98.2 F (36.8 C)   TempSrc:  Oral Oral   SpO2: 94% 93% 94%   Weight:      Height:       Weight change:   Intake/Output Summary (Last 24 hours) at 11/07/2017 1045 Last data filed at 11/07/2017 0418 Gross per 24 hour  Intake 825.94 ml  Output 300 ml  Net 525.94 ml     Exam: Heart:: Regular rate and rhythm or S1S2 present Lungs: clear to auscultation Abdomen: soft, nontender, normal bowel sounds   Lab Results: CBC Latest Ref Rng & Units 11/06/2017 11/05/2017 11/01/2017  WBC 3.6 - 11.0 K/uL 14.1(H) 15.1(H) 9.8  Hemoglobin 12.0 - 16.0 g/dL 13.0 13.9 12.6  Hematocrit 35.0 - 47.0 % 38.2 41.8 37.5  Platelets 150 - 440 K/uL 203 214 210   CMP Latest Ref Rng & Units 11/06/2017 11/05/2017 10/31/2017  Glucose 70 - 99 mg/dL 100(H) 125(H) 87  BUN 8 - 23 mg/dL 31(H) 33(H) 37(H)  Creatinine 0.44 - 1.00 mg/dL 1.67(H) 2.05(H) 1.02(H)  Sodium 135 - 145 mmol/L 138 138 140  Potassium 3.5 - 5.1 mmol/L 4.7 4.6 4.4  Chloride 98 - 111 mmol/L 105 100 110  CO2 22 - 32 mmol/L 23 24 27   Calcium 8.9 - 10.3 mg/dL 8.9 9.2 9.1  Total Protein 6.5 - 8.1 g/dL 6.5 7.1 6.1(L)  Total Bilirubin 0.3 - 1.2 mg/dL 0.6 0.6 0.8  Alkaline Phos 38 - 126 U/L 69 78 67  AST 15 - 41 U/L 22 23 27   ALT 0 - 44 U/L 13 14 12    Micro Results: Recent Results (from the past 240 hour(s))  Urine culture     Status: None   Collection Time: 10/30/17 11:32 AM  Result Value Ref Range Status   Specimen Description   Final    URINE, RANDOM Performed at  [facility], [address], [place], [place] [address]    Special Requests   Final    NONE Performed at [facility], [address] [place], [place] [address]    Culture   Final    NO GROWTH Performed at [facility], [place] [address], [place], [place] [address]    Report Status 10/31/2017 FINAL  Final  MRSA PCR Screening     Status: Abnormal   Collection Time: 10/30/17  6:37 PM  Result Value Ref Range Status   MRSA by PCR POSITIVE (A) NEGATIVE Final    Comment:        The GeneXpert MRSA Assay (FDA approved for NASAL specimens only), is one component of a comprehensive MRSA colonization surveillance program. It is not intended to diagnose MRSA infection nor to guide or monitor treatment for MRSA infections. RESULT CALLED TO, READ BACK BY AND VERIFIED WITH: MARSHA HATCH @1952  10/30/17 AKT Performed at [facility], [address], [place], [place] [address]    Studies/Results: No results found. Medications: I have reviewed the patient's current medications. Scheduled Meds: .  acetaminophen  650 mg Oral TID  . amitriptyline  25 mg Oral QHS  . aspirin EC  81 mg Oral Daily  . gabapentin  100 mg Oral TID  . heparin  5,000 Units Subcutaneous Q8H  . lactulose  30 g Oral Daily  . levothyroxine  25 mcg Oral QAC breakfast  . ondansetron (ZOFRAN) IV  4 mg Intravenous Once  . pantoprazole  40 mg Oral BID AC  . simvastatin  40 mg Oral q1800  . venlafaxine XR  150 mg Oral QHS   Continuous Infusions: PRN Meds:.morphine injection, ondansetron **OR** ondansetron (ZOFRAN) IV, prochlorperazine   Assessment: Principal Problem:   AKI (acute kidney injury) ([place]) Active Problems:   Hypertension   Hypothyroidism   Choledocholithiasis   Nausea & vomiting   Problems with swallowing and mastication   Acute gastritis without hemorrhage   Stricture and stenosis of esophagus status post EGD on 11/06/2017 and dilation of the GE junction  stenosis  Plan: Increase Protonix to 40 mg twice daily, long-term due to presence of medium size hiatal hernia and to prevent GERD Advance diet as tolerated ERCP can be performed as inpatient early next week if not discharged, otherwise we'll arrange ERCP as outpatient when patient is off full dose aspirin for 5 days   LOS: 2 days   Sonya [name] 11/07/2017, 10:45 AM

## 2017-11-08 ENCOUNTER — Encounter: Payer: Self-pay | Admitting: Gastroenterology

## 2017-11-08 DIAGNOSIS — R112 Nausea with vomiting, unspecified: Secondary | ICD-10-CM

## 2017-11-08 LAB — CBC
HCT: 38.3 % (ref 35.0–47.0)
Hemoglobin: 12.7 g/dL (ref 12.0–16.0)
MCH: 28.5 pg (ref 26.0–34.0)
MCHC: 33.1 g/dL (ref 32.0–36.0)
MCV: 86 fL (ref 80.0–100.0)
Platelets: 207 10*3/uL (ref 150–440)
RBC: 4.46 MIL/uL (ref 3.80–5.20)
RDW: 14.2 % (ref 11.5–14.5)
WBC: 11.1 10*3/uL — ABNORMAL HIGH (ref 3.6–11.0)

## 2017-11-08 LAB — BASIC METABOLIC PANEL
Anion gap: 10 (ref 5–15)
BUN: 15 mg/dL (ref 8–23)
CALCIUM: 8.9 mg/dL (ref 8.9–10.3)
CO2: 22 mmol/L (ref 22–32)
CREATININE: 0.93 mg/dL (ref 0.44–1.00)
Chloride: 110 mmol/L (ref 98–111)
GFR calc Af Amer: >60 mL/min (ref >60)
GFR calc non Af Amer: 55 mL/min — ABNORMAL LOW (ref >60)
GLUCOSE: 104 mg/dL — AB (ref 70–99)
Potassium: 3.6 mmol/L (ref 3.5–5.1)
Sodium: 142 mmol/L (ref 135–145)

## 2017-11-08 MED ORDER — FAMOTIDINE IN NACL 20-0.9 MG/50ML-% IV SOLN
20.0000 mg | Freq: Two times a day (BID) | INTRAVENOUS | Status: DC
  Administered 2017-11-08 – 2017-11-13 (×10): 20 mg via INTRAVENOUS
  Filled 2017-11-08 (×11): qty 50

## 2017-11-08 MED ORDER — ONDANSETRON HCL 4 MG/2ML IJ SOLN
4.0000 mg | Freq: Four times a day (QID) | INTRAMUSCULAR | Status: DC
  Administered 2017-11-08 – 2017-11-13 (×15): 4 mg via INTRAVENOUS
  Filled 2017-11-08 (×18): qty 2

## 2017-11-08 MED ORDER — PANTOPRAZOLE SODIUM 40 MG PO TBEC
40.0000 mg | DELAYED_RELEASE_TABLET | Freq: Two times a day (BID) | ORAL | Status: DC
  Administered 2017-11-08 – 2017-11-16 (×15): 40 mg via ORAL
  Filled 2017-11-08 (×15): qty 1

## 2017-11-08 MED ORDER — OLANZAPINE 5 MG PO TABS
5.0000 mg | ORAL_TABLET | Freq: Every day | ORAL | Status: DC
  Administered 2017-11-08 – 2017-11-15 (×8): 5 mg via ORAL
  Filled 2017-11-08 (×9): qty 1

## 2017-11-08 MED ORDER — NYSTATIN 100000 UNIT/ML MT SUSP
5.0000 mL | Freq: Four times a day (QID) | OROMUCOSAL | Status: DC
  Administered 2017-11-09 – 2017-11-16 (×23): 500000 [IU] via ORAL
  Filled 2017-11-08 (×28): qty 5

## 2017-11-08 NOTE — Progress Notes (Signed)
[facility] [place] NAME: Sonya [name]    MR#:  [id number]  DATE OF BIRTH:  08/02/1932  SUBJECTIVE:   Patient continues to have persistent nausea but no vomiting.  P.o. intake is quite poor.  Family is at bedside.  REVIEW OF SYSTEMS:    Review of Systems  Constitutional: Negative for chills and fever.  HENT: Negative for congestion and tinnitus.   Eyes: Negative for blurred vision and double vision.  Respiratory: Negative for cough, shortness of breath and wheezing.   Cardiovascular: Negative for chest pain, orthopnea and PND.  Gastrointestinal: Positive for abdominal pain and nausea. Negative for diarrhea and vomiting.  Genitourinary: Negative for dysuria and hematuria.  Neurological: Negative for dizziness, sensory change and focal weakness.  All other systems reviewed and are negative.   Nutrition: Clear liquids Tolerating Diet: Little Tolerating PT: Eval noted.   DRUG ALLERGIES:   Allergies  Allergen Reactions  . Clopidogrel Nausea And Vomiting     reported by [facility] 11/24/13  . Omeprazole Other (See Comments)    Unknown reaction - reported by [place] 11/24/13    VITALS:  Blood pressure (!) 158/69, pulse (!) 120, temperature 97.8 F (36.6 C), temperature source Oral, resp. rate 16, height 5' (1.524 m), weight 54.5 kg, SpO2 98 %.  PHYSICAL EXAMINATION:   Physical Exam  GENERAL:  84 y.o.-year-old patient sitting up in bed lethargic, nauseated but in NAD.   EYES: Pupils equal, round, reactive to light and accommodation. No scleral icterus. Extraocular muscles intact.  HEENT: Head atraumatic, normocephalic. Oropharynx and nasopharynx clear.  NECK:  Supple, no jugular venous distention. No thyroid enlargement, no tenderness.  LUNGS: Normal breath sounds bilaterally, no wheezing, rales, rhonchi. No use of accessory muscles of respiration.  CARDIOVASCULAR: S1, S2 normal. No  murmurs, rubs, or gallops.  ABDOMEN: Soft, nontender, nondistended. Bowel sounds present. No organomegaly or mass.  EXTREMITIES: No cyanosis, clubbing or edema b/l.    NEUROLOGIC: Cranial nerves II through XII are intact. Left sided weakness due to previous CVA.   PSYCHIATRIC: The patient is alert and oriented x 2.  SKIN: No obvious rash, lesion, or ulcer.    LABORATORY PANEL:   CBC Recent Labs  Lab 11/08/17 0620  WBC 11.1*  HGB 12.7  HCT 38.3  PLT 207   ------------------------------------------------------------------------------------------------------------------  Chemistries  Recent Labs  Lab 11/06/17 0320 11/08/17 0620  NA 138 142  K 4.7 3.6  CL 105 110  CO2 23 22  GLUCOSE 100* 104*  BUN 31* 15  CREATININE 1.67* 0.93  CALCIUM 8.9 8.9  AST 22  --   ALT 13  --   ALKPHOS 69  --   BILITOT 0.6  --    ------------------------------------------------------------------------------------------------------------------  Cardiac Enzymes Recent Labs  Lab 11/05/17 2220  TROPONINI <0.03   ------------------------------------------------------------------------------------------------------------------  RADIOLOGY:  No results found.   ASSESSMENT AND PLAN:   84 year old female with past medical history of breast cancer, stroke, hypertension, hyperlipidemia and GERD, history of coronary artery disease who presented to the hospital due to abdominal pain and nausea.  1.  Abdominal pain/nausea- suspected to be secondary to underlying constipation combined with gastritis/esophageal stricture.  Patient was seen by gastroenterology underwent an upper GI endoscopy and underwent dilatation of the GE junction stenosis. -Patient continues to have persistent nausea but no vomiting.  P.o. intake seems quite poor.  We will place the patient on some scheduled Zofran and monitor response. -Patient  cannot take p.o. therefore will switch to IV Pepcid, discussed with  gastroenterology.  2.  Acute kidney injury-secondary to dehydration from ongoing nausea and abdominal pain. -Improved with IV fluid hydration and back to baseline.  Renal dose meds, avoid nephrotoxins.  3. Constipation - improving. Cont. Lactulose, and received Enema yesterday.   4.  Hypothyroidism-continue Synthroid.  5.  Neuropathy-continue gabapentin.  6.  Hyperlipidemia-continue simvastatin.  7.  Choledocholithiasis-no acute obstruction presently.  Patient would benefit from an ERCP but this can be done as an outpatient.  Patient's LFTs are stable, bilirubins, alkaline phosphatase are stable.  8. Depression - cont. Effexor.    All the records are reviewed and case discussed with Care Management/Social Worker. Management plans discussed with the patient, family and they are in agreement.  CODE STATUS: DNR  DVT Prophylaxis: Hep SQ  TOTAL TIME TAKING CARE OF THIS PATIENT: 30 minutes.   POSSIBLE D/C IN 2-3 DAYS, DEPENDING ON CLINICAL CONDITION.   Henreitta Leber M.D on 11/08/2017 at 2:02 PM  Between 7am to 6pm - Pager - [phone]  After 6pm go to [email or web address] - Proofreader  [facility]  Office  [phone]  CC: Primary care physician; Baxter Hire, MD

## 2017-11-08 NOTE — Progress Notes (Signed)
Cephas Darby, MD [address]  [place]  [place], [place] [address]  Main: [phone]  Fax: [phone] Pager: [phone]   Subjective: Patient reports nausea, does not have the desire to eat and everything tastes bad. Her family is bedside. She denies burning taste today. She tried apple juice only. She answered minimal questions for me today, sleeping in her bed, lethargic   Objective: Vital signs in last 24 hours: Vitals:   11/07/17 2023 11/08/17 0538 11/08/17 0642 11/08/17 1309  BP: (!) 178/74 (!) 150/109 (!) 145/80 (!) 158/69  Pulse: (!) 115 (!) 188 (!) 112 (!) 120  Resp: 18   16  Temp: 98.4 F (36.9 C) 98 F (36.7 C)  97.8 F (36.6 C)  TempSrc: Oral Oral  Oral  SpO2: 99%  92% 98%  Weight:      Height:       Weight change:   Intake/Output Summary (Last 24 hours) at 11/08/2017 1714 Last data filed at 11/08/2017 1500 Gross per 24 hour  Intake 0 ml  Output 1250 ml  Net -1250 ml     Exam: Heart:: Regular rate and rhythm or S1S2 present Lungs: clear to auscultation Abdomen: soft, nontender, normal bowel sounds   Lab Results: CBC Latest Ref Rng & Units 11/08/2017 11/06/2017 11/05/2017  WBC 3.6 - 11.0 K/uL 11.1(H) 14.1(H) 15.1(H)  Hemoglobin 12.0 - 16.0 g/dL 12.7 13.0 13.9  Hematocrit 35.0 - 47.0 % 38.3 38.2 41.8  Platelets 150 - 440 K/uL 207 203 214   CMP Latest Ref Rng & Units 11/08/2017 11/06/2017 11/05/2017  Glucose 70 - 99 mg/dL 104(H) 100(H) 125(H)  BUN 8 - 23 mg/dL 15 31(H) 33(H)  Creatinine 0.44 - 1.00 mg/dL 0.93 1.67(H) 2.05(H)  Sodium 135 - 145 mmol/L 142 138 138  Potassium 3.5 - 5.1 mmol/L 3.6 4.7 4.6  Chloride 98 - 111 mmol/L 110 105 100  CO2 22 - 32 mmol/L 22 23 24   Calcium 8.9 - 10.3 mg/dL 8.9 8.9 9.2  Total Protein 6.5 - 8.1 g/dL - 6.5 7.1  Total Bilirubin 0.3 - 1.2 mg/dL - 0.6 0.6  Alkaline Phos 38 - 126 U/L - 69 78  AST 15 - 41 U/L - 22 23  ALT 0 - 44 U/L - 13 14   Micro Results: Recent Results (from the past 240 hour(s))    Urine culture     Status: None   Collection Time: 10/30/17 11:32 AM  Result Value Ref Range Status   Specimen Description   Final    URINE, RANDOM Performed at [facility], [address] [place], [place] [address]    Special Requests   Final    NONE Performed at [facility], [address] [place], [place] [address]    Culture   Final    NO GROWTH Performed at [facility], [place] [address], [place], [place] [address]    Report Status 10/31/2017 FINAL  Final  MRSA PCR Screening     Status: Abnormal   Collection Time: 10/30/17  6:37 PM  Result Value Ref Range Status   MRSA by PCR POSITIVE (A) NEGATIVE Final    Comment:        The GeneXpert MRSA Assay (FDA approved for NASAL specimens only), is one component of a comprehensive MRSA colonization surveillance program. It is not intended to diagnose MRSA infection nor to guide or monitor treatment for MRSA infections. RESULT CALLED TO, READ BACK BY AND VERIFIED WITH: MARSHA  HATCH @1952  10/30/17 AKT Performed at [facility], [address] [place], [place] [address]    Studies/Results: No results found. Medications: I have reviewed the patient's current medications. Scheduled Meds: . acetaminophen  650 mg Oral TID  . amitriptyline  25 mg Oral QHS  . aspirin EC  81 mg Oral Daily  . gabapentin  100 mg Oral TID  . heparin  5,000 Units Subcutaneous Q8H  . lactulose  30 g Oral Daily  . levothyroxine  25 mcg Oral QAC breakfast  . ondansetron (ZOFRAN) IV  4 mg Intravenous Once  . ondansetron (ZOFRAN) IV  4 mg Intravenous Q6H  . simvastatin  40 mg Oral q1800  . venlafaxine XR  150 mg Oral QHS   Continuous Infusions: . sodium chloride 75 mL/hr at 11/08/17 0842  . famotidine (PEPCID) IV 20 mg (11/08/17 1045)   PRN Meds:.morphine injection, prochlorperazine   Assessment: Principal Problem:   AKI (acute kidney injury) ([place]) Active Problems:   Hypertension   Hypothyroidism    Choledocholithiasis   Nausea & vomiting   Problems with swallowing and mastication   Acute gastritis without hemorrhage   Stricture and stenosis of esophagus status post EGD on 11/06/2017 and dilation of the GE junction stenosis to 47mm Now, she is having intractable nausea, dysgeusia, and loss of appetite  Plan: Protonix to 40 mg twice daily Zofran 4 mg every 6 hours Zyprexa 5 mg at bedtime Nystatin swish and swallow for dysgeusia Low residual diet for now ERCP can be performed as inpatient early next week if not discharged, otherwise we'll arrange ERCP as outpatient when patient is off full dose aspirin for 5 days  Dr. Vicente Males to cover from tomorrow   LOS: 3 days   Talea Manges 11/08/2017, 5:14 PM

## 2017-11-09 LAB — BASIC METABOLIC PANEL
ANION GAP: 5 (ref 5–15)
BUN: 11 mg/dL (ref 8–23)
CALCIUM: 8.7 mg/dL — AB (ref 8.9–10.3)
CHLORIDE: 111 mmol/L (ref 98–111)
CO2: 26 mmol/L (ref 22–32)
Creatinine, Ser: 0.94 mg/dL (ref 0.44–1.00)
GFR calc Af Amer: >60 mL/min (ref >60)
GFR calc non Af Amer: 55 mL/min — ABNORMAL LOW (ref >60)
GLUCOSE: 86 mg/dL (ref 70–99)
POTASSIUM: 3.6 mmol/L (ref 3.5–5.1)
Sodium: 142 mmol/L (ref 135–145)

## 2017-11-09 MED ORDER — CLONIDINE HCL 0.1 MG PO TABS
0.2000 mg | ORAL_TABLET | Freq: Two times a day (BID) | ORAL | Status: DC
  Administered 2017-11-09 – 2017-11-16 (×14): 0.2 mg via ORAL
  Filled 2017-11-09 (×14): qty 2

## 2017-11-09 MED ORDER — AMLODIPINE BESYLATE 10 MG PO TABS
10.0000 mg | ORAL_TABLET | Freq: Every day | ORAL | Status: DC
  Administered 2017-11-09 – 2017-11-16 (×8): 10 mg via ORAL
  Filled 2017-11-09 (×8): qty 1

## 2017-11-09 NOTE — Progress Notes (Signed)
[place] [facility] [place] NAME: Sonya [name]    MR#:  [id number]  DATE OF BIRTH:  1932/03/17  SUBJECTIVE:   Patient's persistent nausea has improved with scheduled antiemetics.  She is tolerating a clear liquid diet well advance to full liquids today.  REVIEW OF SYSTEMS:    Review of Systems  Constitutional: Negative for chills and fever.  HENT: Negative for congestion and tinnitus.   Eyes: Negative for blurred vision and double vision.  Respiratory: Negative for cough, shortness of breath and wheezing.   Cardiovascular: Negative for chest pain, orthopnea and PND.  Gastrointestinal: Positive for nausea. Negative for abdominal pain, diarrhea and vomiting.  Genitourinary: Negative for dysuria and hematuria.  Neurological: Negative for dizziness, sensory change and focal weakness.  All other systems reviewed and are negative.   Nutrition: Clear Liquids Tolerating Diet: Yes Tolerating PT: Eval noted.   DRUG ALLERGIES:   Allergies  Allergen Reactions  . Clopidogrel Nausea And Vomiting     reported by [facility] 11/24/13  . Omeprazole Other (See Comments)    Unknown reaction - reported by [facility] 11/24/13    VITALS:  Blood pressure (!) 177/74, pulse 99, temperature 97.9 F (36.6 C), temperature source Oral, resp. rate 19, height 5' (1.524 m), weight 54.5 kg, SpO2 99 %.  PHYSICAL EXAMINATION:   Physical Exam  GENERAL:  84 y.o.-year-old patient sitting up in bed in NAD EYES: Pupils equal, round, reactive to light and accommodation. No scleral icterus. Extraocular muscles intact.  HEENT: Head atraumatic, normocephalic. Oropharynx and nasopharynx clear.  NECK:  Supple, no jugular venous distention. No thyroid enlargement, no tenderness.  LUNGS: Normal breath sounds bilaterally, no wheezing, rales, rhonchi. No use of accessory muscles of respiration.  CARDIOVASCULAR: S1, S2 normal. No murmurs, rubs,  or gallops.  ABDOMEN: Soft, nontender, nondistended. Bowel sounds present. No organomegaly or mass.  EXTREMITIES: No cyanosis, clubbing or edema b/l.    NEUROLOGIC: Cranial nerves II through XII are intact. Left sided hemiparesis due to previous CVA.   PSYCHIATRIC: The patient is alert and oriented x 2.  SKIN: No obvious rash, lesion, or ulcer.    LABORATORY PANEL:   CBC Recent Labs  Lab 11/08/17 0620  WBC 11.1*  HGB 12.7  HCT 38.3  PLT 207   ------------------------------------------------------------------------------------------------------------------  Chemistries  Recent Labs  Lab 11/06/17 0320  11/09/17 0631  NA 138   < > 142  K 4.7   < > 3.6  CL 105   < > 111  CO2 23   < > 26  GLUCOSE 100*   < > 86  BUN 31*   < > 11  CREATININE 1.67*   < > 0.94  CALCIUM 8.9   < > 8.7*  AST 22  --   --   ALT 13  --   --   ALKPHOS 69  --   --   BILITOT 0.6  --   --    < > = values in this interval not displayed.   ------------------------------------------------------------------------------------------------------------------  Cardiac Enzymes Recent Labs  Lab 11/05/17 2220  TROPONINI <0.03   ------------------------------------------------------------------------------------------------------------------  RADIOLOGY:  No results found.   ASSESSMENT AND PLAN:   84 year old female with past medical history of breast cancer, stroke, hypertension, hyperlipidemia and GERD, history of coronary artery disease who presented to the hospital due to abdominal pain and nausea.  1.  Abdominal pain/nausea- suspected to be secondary to underlying constipation combined  with gastritis/esophageal stricture.  Patient was seen by gastroenterology underwent an upper GI endoscopy and underwent dilatation of the GE junction stenosis. -Nausea has improved since yesterday with scheduled Zofran.  Tolerating clear liquids and will advance to full liquids today.  Continue IV Pepcid.  2.  Acute  kidney injury-secondary to dehydration from ongoing nausea and abdominal pain. -Resolved and creatinine at baseline now.  Renal dose meds, avoid nephrotoxins.  3. Constipation - improving. Cont. Lactulose, and received Enema yesterday.   4.  Hypothyroidism-continue Synthroid.  5.  Neuropathy-continue gabapentin.  6.  Hyperlipidemia-continue simvastatin.  7.  Choledocholithiasis-no acute obstruction presently.  Patient would benefit from an ERCP but this can be done as an outpatient.  Patient's LFTs are stable, bilirubins, alkaline phosphatase are stable.  8. Depression - cont. Effexor.    Possible d/c tomorrow if tolerating PO well. Discussed w/ nursing staff, pt. And also Education officer, museum  All the records are reviewed and case discussed with Care Management/Social Worker. Management plans discussed with the patient, family and they are in agreement.  CODE STATUS: DNR  DVT Prophylaxis: Hep SQ  TOTAL TIME TAKING CARE OF THIS PATIENT: 30 minutes.   POSSIBLE D/C IN 1-2 DAYS, DEPENDING ON CLINICAL CONDITION.   Henreitta Leber M.D on 11/09/2017 at 3:00 PM  Between 7am to 6pm - Pager - [phone]  After 6pm go to [email or web address] - Proofreader  [facility]  Office  [phone]  CC: Primary care physician; Baxter Hire, MD

## 2017-11-09 NOTE — Progress Notes (Signed)
   , MD [address], [address], [place], [place], [address] [address], [address], [place], [place], [address] Phone: [phone]  Fax: [phone]   Sonya [name] is being followed for cholidocholithiasis , nausea  Subjective: Nausea is better today and is tolerating a diet , no pain    Objective: Vital signs in last 24 hours: Vitals:   11/08/17 2106 11/09/17 0123 11/09/17 0613 11/09/17 0645  BP: (!) 185/76 (!) 149/64 (!) 188/81 (!) 178/83  Pulse: (!) 102 (!) 114 96 100  Resp: 19  (!) 22   Temp: 98.2 F (36.8 C)  97.7 F (36.5 C)   TempSrc: Oral  Oral   SpO2: 96%  100%   Weight:      Height:       Weight change:   Intake/Output Summary (Last 24 hours) at 11/09/2017 1428 Last data filed at 11/09/2017 0627 Gross per 24 hour  Intake 2595.17 ml  Output 700 ml  Net 1895.17 ml     Exam: Heart:: Regular rate and rhythm, S1S2 present or without murmur or extra heart sounds Lungs: normal, clear to auscultation and clear to auscultation and percussion Abdomen: soft, nontender, normal bowel sounds   Lab Results: @LABTEST2@ Micro Results: Recent Results (from the past 240 hour(s))  MRSA PCR Screening     Status: Abnormal   Collection Time: 10/30/17  6:37 PM  Result Value Ref Range Status   MRSA by PCR POSITIVE (A) NEGATIVE Final    Comment:        The GeneXpert MRSA Assay (FDA approved for NASAL specimens only), is one component of a comprehensive MRSA colonization surveillance program. It is not intended to diagnose MRSA infection nor to guide or monitor treatment for MRSA infections. RESULT CALLED TO, READ BACK BY AND VERIFIED WITH: MARSHA HATCH @1952 10/30/17 AKT Performed at [facility], [address] [place], [place] [address]    Studies/Results: No results found. Medications: I have reviewed the patient's current medications. Scheduled Meds: . acetaminophen  650 mg Oral TID  . amitriptyline  25 mg Oral QHS  . amLODipine   10 mg Oral Daily  . aspirin EC  81 mg Oral Daily  . cloNIDine  0.2 mg Oral BID  . gabapentin  100 mg Oral TID  . heparin  5,000 Units Subcutaneous Q8H  . lactulose  30 g Oral Daily  . levothyroxine  25 mcg Oral QAC breakfast  . nystatin  5 mL Oral QID  . OLANZapine  5 mg Oral QHS  . ondansetron (ZOFRAN) IV  4 mg Intravenous Once  . ondansetron (ZOFRAN) IV  4 mg Intravenous Q6H  . pantoprazole  40 mg Oral BID  . simvastatin  40 mg Oral q1800  . venlafaxine XR  150 mg Oral QHS   Continuous Infusions: . sodium chloride 75 mL/hr at 11/09/17 1250  . famotidine (PEPCID) IV 20 mg (11/09/17 1003)   PRN Meds:.morphine injection, prochlorperazine   Assessment: Principal Problem:   AKI (acute kidney injury) (HCC) Active Problems:   Hypertension   Hypothyroidism   Choledocholithiasis   Nausea & vomiting   Problems with swallowing and mastication   Acute gastritis without hemorrhage   Stricture and stenosis of esophagus  Hepatic Function Latest Ref Rng & Units 11/06/2017 11/05/2017 10/31/2017  Total Protein 6.5 - 8.1 g/dL 6.5 7.1 6.1(L)  Albumin 3.5 - 5.0 g/dL 3.2(L) 3.4(L) 2.7(L)  AST 15 - 41 U/L 22 23 27  ALT 0 - 44 U/L 13   14 12  Alk Phosphatase 38 - 126 U/L 69 78 67  Total Bilirubin 0.3 - 1.2 mg/dL 0.6 0.6 0.8  Bilirubin, Direct 0.0 - 0.2 mg/dL - - 0.3(H)     Sonya [name] 84 y.o. female with dysphagia s/p dilation . Found to have cholidocholithiasis and plan for ERCP . Creatinine has improved .    Plan: 1. Discussed with Dr Wohl and he will do ERCP tomorrow  2. Will need cholecystectomy after ERCP. Can consult surgery to determine the timing if before discharge or after.   I have discussed alternative options, risks & benefits,  which include, but are not limited to, bleeding, infection, perforation,respiratory complication & drug reaction, acute pancreatitis and death.  The patient agrees with this plan & written consent will be obtained.        LOS: 4 days     , MD 11/09/2017, 2:28 PM 

## 2017-11-09 NOTE — Clinical Social Work Note (Signed)
CSW has begun reauth with [employer] this morning. Shela Leff MSW,LCSW [phone]

## 2017-11-09 NOTE — Accreditation Note (Deleted)
CSW has receive re-auth from [facility]. Auth number: [id number]. Shela Leff MSW-LCSW [phone]

## 2017-11-09 NOTE — Clinical Social Work Note (Signed)
CSW has received auth from [facility] today. Auth number: [id number] Shela Leff MSW,LCSW [phone]

## 2017-11-09 NOTE — Care Management Important Message (Signed)
Important Message  Patient Details  Name: Sonya [name] MRN: [id number] Date of Birth: 29-Jun-1932   Medicare Important Message Given:  Yes    Shelbie Hutching, RN 11/09/2017, 3:25 PM

## 2017-11-10 ENCOUNTER — Inpatient Hospital Stay: Payer: Medicare PPO | Admitting: Anesthesiology

## 2017-11-10 ENCOUNTER — Encounter
Admission: EM | Disposition: A | Discharge status: Skilled Nursing Facility | Payer: Self-pay | Source: Skilled Nursing Facility | Attending: Specialist

## 2017-11-10 ENCOUNTER — Inpatient Hospital Stay: Payer: Medicare PPO

## 2017-11-10 DIAGNOSIS — R932 Abnormal findings on diagnostic imaging of liver and biliary tract: Secondary | ICD-10-CM

## 2017-11-10 DIAGNOSIS — K8050 Calculus of bile duct without cholangitis or cholecystitis without obstruction: Secondary | ICD-10-CM

## 2017-11-10 HISTORY — PX: ENDOSCOPIC RETROGRADE CHOLANGIOPANCREATOGRAPHY (ERCP) WITH PROPOFOL: SHX5810

## 2017-11-10 SURGERY — ENDOSCOPIC RETROGRADE CHOLANGIOPANCREATOGRAPHY (ERCP) WITH PROPOFOL
Anesthesia: General

## 2017-11-10 MED ORDER — METOPROLOL TARTRATE 5 MG/5ML IV SOLN
INTRAVENOUS | Status: DC | PRN
  Administered 2017-11-10: 10 mg via INTRAVENOUS

## 2017-11-10 MED ORDER — POLYETHYLENE GLYCOL 3350 17 G PO PACK
17.0000 g | PACK | Freq: Every day | ORAL | Status: DC | PRN
  Administered 2017-11-11: 17 g via ORAL
  Filled 2017-11-10: qty 1

## 2017-11-10 MED ORDER — PROPOFOL 500 MG/50ML IV EMUL
INTRAVENOUS | Status: DC | PRN
  Administered 2017-11-10: 100 ug/kg/min via INTRAVENOUS

## 2017-11-10 MED ORDER — INDOMETHACIN 50 MG RE SUPP
RECTAL | Status: AC
  Filled 2017-11-10: qty 2

## 2017-11-10 MED ORDER — INDOMETHACIN 50 MG RE SUPP
RECTAL | Status: DC | PRN
  Administered 2017-11-10: 100 mg via RECTAL

## 2017-11-10 MED ORDER — INDOMETHACIN 50 MG RE SUPP
100.0000 mg | Freq: Once | RECTAL | Status: DC
  Filled 2017-11-10: qty 2

## 2017-11-10 MED ORDER — SODIUM CHLORIDE 0.9 % IV SOLN
INTRAVENOUS | Status: DC
  Administered 2017-11-10: 1000 mL via INTRAVENOUS

## 2017-11-10 MED ORDER — LIDOCAINE HCL (CARDIAC) PF 100 MG/5ML IV SOSY
PREFILLED_SYRINGE | INTRAVENOUS | Status: DC | PRN
  Administered 2017-11-10: 30 mg via INTRAVENOUS

## 2017-11-10 MED ORDER — METOPROLOL TARTRATE 5 MG/5ML IV SOLN
INTRAVENOUS | Status: AC
  Filled 2017-11-10: qty 5

## 2017-11-10 MED ORDER — PROPOFOL 500 MG/50ML IV EMUL
INTRAVENOUS | Status: AC
  Filled 2017-11-10: qty 50

## 2017-11-10 NOTE — Op Note (Signed)
[facility] Patient Name: Sonya [name] Procedure Date: 11/10/2017 10:25 AM MRN: [id number] Account #: [id number] Date of Birth: 1932/11/12 Admit Type: Inpatient Age: 84 Room: [facility] ENDO ROOM 4 Gender: Female Note Status: Finalized Procedure:            ERCP Indications:          Bile duct stone(s) Providers:            Lucilla Lame MD, MD Referring MD:         Andres Labrum, MD (Referring MD) Medicines:            Propofol per Anesthesia Complications:        No immediate complications. Procedure:            Pre-Anesthesia Assessment:                       - Prior to the procedure, a History and Physical was                        performed, and patient medications and allergies were                        reviewed. The patient's tolerance of previous                        anesthesia was also reviewed. The risks and benefits of                        the procedure and the sedation options and risks were                        discussed with the patient. All questions were                        answered, and informed consent was obtained. Prior                        Anticoagulants: The patient has taken no previous                        anticoagulant or antiplatelet agents. ASA Grade                        Assessment: II - A patient with mild systemic disease.                        After reviewing the risks and benefits, the patient was                        deemed in satisfactory condition to undergo the                        procedure.                       After obtaining informed consent, the scope was passed                        under direct vision. Throughout the procedure, the  patient's blood pressure, pulse, and oxygen saturations                        were monitored continuously. The Duodenoscope was                        introduced through the mouth, and used to inject                        contrast  into and used to inject contrast into the bile                        duct. The ERCP was accomplished without difficulty. The                        patient tolerated the procedure well. Findings:      The scout film was normal. The esophagus was successfully intubated       under direct vision. The scope was advanced to a normal major papilla in       the descending duodenum without detailed examination of the pharynx,       larynx and associated structures, and upper GI tract. The upper GI tract       was grossly normal. The bile duct was deeply cannulated with the       short-nosed traction sphincterotome. Contrast was injected. I personally       interpreted the bile duct images. There was brisk flow of contrast       through the ducts. Image quality was excellent. Contrast extended to the       entire biliary tree. The main bile duct contained filling defect(s)       thought to be a stone and sludge. A wire was passed into the biliary       tree. An 8 mm biliary sphincterotomy was made with a traction (standard)       sphincterotome using ERBE electrocautery. There was no       post-sphincterotomy bleeding. To discover objects, the biliary tree was       swept with an 18 mm balloon starting at the bifurcation. All stones were       removed. Nothing was found. Impression:           - A filling defect consistent with a stone and sludge                        was seen on the cholangiogram.                       - Choledocholithiasis was found. Complete removal was                        accomplished by biliary sphincterotomy and balloon                        extraction.                       - A biliary sphincterotomy was performed.                       - The biliary tree was swept and nothing was found. Recommendation:       -  Discharge patient to home.                       - Return patient to hospital ward for observation.                       - Watch for pancreatitis, bleeding,  perforation, and                        cholangitis. Procedure Code(s):    --- Professional ---                       [phone], Endoscopic retrograde cholangiopancreatography                        (ERCP); with removal of calculi/debris from                        biliary/pancreatic duct(s)                       43262, Endoscopic retrograde cholangiopancreatography                        (ERCP); with sphincterotomy/papillotomy                       [phone], Endoscopic catheterization of the biliary ductal                        system, radiological supervision and interpretation Diagnosis Code(s):    --- Professional ---                       K80.50, Calculus of bile duct without cholangitis or                        cholecystitis without obstruction                       R93.2, Abnormal findings on diagnostic imaging of liver                        and biliary tract CPT copyright 2017 American Medical Association. All rights reserved. The codes documented in this report are preliminary and upon coder review may  be revised to meet current compliance requirements. Lucilla Lame MD, MD 11/10/2017 11:00:14 AM This report has been signed electronically. Number of Addenda: 0 Note Initiated On: 11/10/2017 10:25 AM      [facility]

## 2017-11-10 NOTE — Anesthesia Postprocedure Evaluation (Signed)
Anesthesia Post Note  Patient: Sonya [name]  Procedure(s) Performed: ENDOSCOPIC RETROGRADE CHOLANGIOPANCREATOGRAPHY (ERCP) WITH PROPOFOL (N/A )  Patient location during evaluation: PACU Anesthesia Type: General Level of consciousness: awake and alert Pain management: pain level controlled Vital Signs Assessment: post-procedure vital signs reviewed and stable Respiratory status: spontaneous breathing, nonlabored ventilation, respiratory function stable and patient connected to nasal cannula oxygen Cardiovascular status: blood pressure returned to baseline and stable Postop Assessment: no apparent nausea or vomiting Anesthetic complications: no     Last Vitals:  Vitals:   11/10/17 1122 11/10/17 1150  BP: (!) 151/65 (!) 153/66  Pulse:  69  Resp:  16  Temp:  (!) 36.3 C  SpO2:  100%    Last Pain:  Vitals:   11/10/17 1150  TempSrc: Oral  PainSc:                  Durenda Hurt

## 2017-11-10 NOTE — Anesthesia Preprocedure Evaluation (Addendum)
Anesthesia Evaluation  Patient identified by MRN, date of birth, ID band Patient unresponsive    Reviewed: Allergy & Precautions, H&P , NPO status , Patient's Chart, lab work & pertinent test results  History of Anesthesia Complications Negative for: history of anesthetic complications  Airway Mallampati: IV   Neck ROM: limited  Mouth opening: Limited Mouth Opening  Dental  (+) Chipped, Missing, Poor Dentition   Pulmonary neg shortness of breath, former smoker,    breath sounds clear to auscultation       Cardiovascular Exercise Tolerance: Poor hypertension, + CAD and + Peripheral Vascular Disease   Rhythm:regular Rate:Normal     Neuro/Psych  Neuromuscular disease CVA (left side), Residual Symptoms negative neurological ROS  negative psych ROS   GI/Hepatic negative GI ROS, Neg liver ROS, GERD  ,  Endo/Other  negative endocrine ROSHypothyroidism   Renal/GU Renal diseasenegative Renal ROS  negative genitourinary   Musculoskeletal   Abdominal   Peds  Hematology negative hematology ROS (+)   Anesthesia Other Findings Patient is NPO appropriate and reports no nausea or vomiting today.    Past Medical History: No date: CAD (coronary artery disease) No date: Carpal tunnel syndrome No date: GERD (gastroesophageal reflux disease) No date: History of shingles No date: Hormone receptor positive breast cancer (HCC) No date: Hyperlipemia No date: Hypertension No date: Stroke [facility])  Past Surgical History: No date: ABDOMINAL HYSTERECTOMY No date: APPENDECTOMY No date: cornoary angioplasty  BMI    Body Mass Index:  23.47 kg/m      Reproductive/Obstetrics negative OB ROS                            Anesthesia Physical  Anesthesia Plan  ASA: III  Anesthesia Plan: General   Post-op Pain Management:    Induction: Intravenous  PONV Risk Score and Plan: Propofol infusion and  TIVA  Airway Management Planned: Natural Airway and Nasal Cannula  Additional Equipment:   Intra-op Plan:   Post-operative Plan:   Informed Consent: I have reviewed the patients History and Physical, chart, labs and discussed the procedure including the risks, benefits and alternatives for the proposed anesthesia with the patient or authorized representative who has indicated his/her understanding and acceptance.   Dental Advisory Given  Plan Discussed with: Anesthesiologist, CRNA and Surgeon  Anesthesia Plan Comments: (Patient post stroke and somewhat sedated today.  Family (son and husband) informed that patient is higher risk for complications from anesthesia during this procedure due to her medical history and age including but not limited to post operative cognitive dysfunction.  They voiced understanding.  Plan to suspend DNR for procedure.  Family voiced understanding.  Family consented for risks of anesthesia including but not limited to:  - adverse reactions to medications - risk of intubation if required - damage to teeth, lips or other oral mucosa - sore throat or hoarseness - Damage to heart, brain, lungs or loss of life )        Anesthesia Quick Evaluation

## 2017-11-10 NOTE — Anesthesia Procedure Notes (Signed)
Performed by: Cook-Martin, Candiss Galeana Pre-anesthesia Checklist: Patient identified, Emergency Drugs available, Suction available, Patient being monitored and Timeout performed Patient Re-evaluated:Patient Re-evaluated prior to induction Oxygen Delivery Method: Nasal cannula Preoxygenation: Pre-oxygenation with 100% oxygen Induction Type: IV induction Airway Equipment and Method: Bite block Placement Confirmation: positive ETCO2 and CO2 detector       

## 2017-11-10 NOTE — Anesthesia Post-op Follow-up Note (Signed)
Anesthesia QCDR form completed.        

## 2017-11-10 NOTE — Progress Notes (Signed)
[facility] [place] NAME: Sonya [name]    MR#:  [id number]  DATE OF BIRTH:  1932/12/20  SUBJECTIVE:   Nausea has significantly improved.  Seen by gastroenterology and status post ERCP today with sphincterotomy and stone extraction.  GI is recommending surgical consult for possible laparoscopic cholecystectomy.  Patient somewhat lethargic and sleepy this morning.  REVIEW OF SYSTEMS:    Review of Systems  Constitutional: Negative for chills and fever.  HENT: Negative for congestion and tinnitus.   Eyes: Negative for blurred vision and double vision.  Respiratory: Negative for cough, shortness of breath and wheezing.   Cardiovascular: Negative for chest pain, orthopnea and PND.  Gastrointestinal: Negative for abdominal pain, diarrhea, nausea and vomiting.  Genitourinary: Negative for dysuria and hematuria.  Neurological: Negative for dizziness, sensory change and focal weakness.  All other systems reviewed and are negative.   Nutrition: NPO for ERCP Tolerating Diet: No Tolerating PT: Eval noted.   DRUG ALLERGIES:   Allergies  Allergen Reactions  . Clopidogrel Nausea And Vomiting     reported by [place] 11/24/13  . Omeprazole Other (See Comments)    Unknown reaction - reported by [place] 11/24/13    VITALS:  Blood pressure (!) 153/66, pulse 69, temperature (!) 97.4 F (36.3 C), temperature source Oral, resp. rate 16, height 5' (1.524 m), weight 54.5 kg, SpO2 100 %.  PHYSICAL EXAMINATION:   Physical Exam  GENERAL:  84 y.o.-year-old patient lying in bed in NAD EYES: Pupils equal, round, reactive to light and accommodation. No scleral icterus. Extraocular muscles intact.  HEENT: Head atraumatic, normocephalic. Oropharynx and nasopharynx clear.  NECK:  Supple, no jugular venous distention. No thyroid enlargement, no tenderness.  LUNGS: Normal breath sounds bilaterally, no wheezing, rales,  rhonchi. No use of accessory muscles of respiration.  CARDIOVASCULAR: S1, S2 normal. No murmurs, rubs, or gallops.  ABDOMEN: Soft, nontender, nondistended. Bowel sounds present. No organomegaly or mass.  EXTREMITIES: No cyanosis, clubbing or edema b/l.    NEUROLOGIC: Cranial nerves II through XII are intact. Left sided hemiparesis due to previous CVA.   PSYCHIATRIC: The patient is alert and oriented x 3.  SKIN: No obvious rash, lesion, or ulcer.    LABORATORY PANEL:   CBC Recent Labs  Lab 11/08/17 0620  WBC 11.1*  HGB 12.7  HCT 38.3  PLT 207   ------------------------------------------------------------------------------------------------------------------  Chemistries  Recent Labs  Lab 11/06/17 0320  11/09/17 0631  NA 138   < > 142  K 4.7   < > 3.6  CL 105   < > 111  CO2 23   < > 26  GLUCOSE 100*   < > 86  BUN 31*   < > 11  CREATININE 1.67*   < > 0.94  CALCIUM 8.9   < > 8.7*  AST 22  --   --   ALT 13  --   --   ALKPHOS 69  --   --   BILITOT 0.6  --   --    < > = values in this interval not displayed.   ------------------------------------------------------------------------------------------------------------------  Cardiac Enzymes Recent Labs  Lab 11/05/17 2220  TROPONINI <0.03   ------------------------------------------------------------------------------------------------------------------  RADIOLOGY:  Dg C-arm 1-60 Min-no Report  Result Date: 11/10/2017 Fluoroscopy was utilized by the requesting physician.  No radiographic interpretation.     ASSESSMENT AND PLAN:   84 year old female with past medical history of breast cancer, stroke, hypertension,  hyperlipidemia and GERD, history of coronary artery disease who presented to the hospital due to abdominal pain and nausea.  1.  Abdominal pain/nausea- suspected to be secondary to underlying constipation combined with gastritis/esophageal stricture.  Patient was seen by gastroenterology underwent an  upper GI endoscopy and underwent dilatation of the GE junction stenosis. - nausea improved and pt. Is tolerating full liquids well.    - Continue IV Pepcid.  2.  Acute kidney injury-secondary to dehydration from ongoing nausea and abdominal pain. -Resolved and creatinine at baseline now.  Renal dose meds, avoid nephrotoxins.  3. Choledocholithiasis- patient's LFTs and lipase and alkaline phosphatase are stable.  Seen by GI and status post ERCP today with sphincterotomy and extraction of stones.  Patient did have a filling defect based on cholangiogram.  GI is recommending cholecystectomy and surgical consult has been placed.  4. Constipation - resolved. Will start on Miralax PRN.   5.  Hypothyroidism-continue Synthroid.  6.  Neuropathy-continue gabapentin.  7.  Hyperlipidemia-continue simvastatin.  8. Depression - cont. Effexor.     All the records are reviewed and case discussed with Care Management/Social Worker. Management plans discussed with the patient, family and they are in agreement.  CODE STATUS: DNR  DVT Prophylaxis: Hep SQ  TOTAL TIME TAKING CARE OF THIS PATIENT: 30 minutes.   POSSIBLE D/C IN 1-2 DAYS, DEPENDING ON CLINICAL CONDITION.   Henreitta Leber M.D on 11/10/2017 at 2:48 PM  Between 7am to 6pm - Pager - [phone]  After 6pm go to [email or web address] - Proofreader  [facility]  Office  [phone]  CC: Primary care physician; Baxter Hire, MD

## 2017-11-10 NOTE — Consult Note (Signed)
Subjective:   CC: Choledocholithiasis  HPI:  Sonya [name] is a 84 y.o. female who is consulted by [facility] for evaluation of above cc.   During work-up for persistent nausea vomiting, CT scan showed incidental choledocholithiasis.  Patient underwent an EGD with esophageal dilation to address the nausea and then subsequently went for an ERCP to remove the stones in the common bile duct.  At that point GI recommended a surgery consult for possible lap chole so surgery was consulted.  Currently patient states nausea has improved significantly denies any right upper quadrant pain.    Past Medical History:  has a past medical history of CAD (coronary artery disease), Carpal tunnel syndrome, GERD (gastroesophageal reflux disease), History of shingles, Hormone receptor positive breast cancer ([place]), Hyperlipemia, Hypertension, and Stroke ([place]).  Past Surgical History:  has a past surgical history that includes Appendectomy; cornoary angioplasty; Abdominal hysterectomy; and Esophagogastroduodenoscopy (egd) with propofol (N/A, 11/06/2017).  Family History: family history includes Hypertension in her son; Ovarian cancer in her mother.  Social History:  reports that she has quit smoking. She has never used smokeless tobacco. She reports that she drank alcohol. She reports that she does not use drugs.  Current Medications:  Medications Prior to Admission  Medication Sig Dispense Refill  . acetaminophen (TYLENOL) 325 MG tablet Take 2 tablets (650 mg total) by mouth every 4 (four) hours as needed for mild pain (or temp > 37.5 C (99.5 F)). 30 tablet 3  . amLODipine (NORVASC) 10 MG tablet Take 1 tablet (10 mg total) by mouth daily. 30 tablet 0  . aspirin EC 81 MG EC tablet Take 1 tablet (81 mg total) by mouth daily. 180 tablet 0  . b complex vitamins tablet Take 1 tablet by mouth daily.    . benazepril (LOTENSIN) 20 MG tablet Take 20 mg by mouth at bedtime.     . bisacodyl (DUCODYL) 5 MG EC tablet Take 10 mg  by mouth daily.    . Cholecalciferol (VITAMIN D3) 1000 units CAPS Take 3,000 Units by mouth at bedtime.     . cloNIDine (CATAPRES) 0.2 MG tablet Take 1 tablet (0.2 mg total) by mouth 2 (two) times daily. 60 tablet 3  . ibuprofen (ADVIL,MOTRIN) 400 MG tablet Take 1 tablet (400 mg total) by mouth every 8 (eight) hours as needed for moderate pain. 20 tablet 0  . lansoprazole (PREVACID) 15 MG capsule Take 15 mg by mouth at bedtime.     Marland Kitchen levothyroxine (SYNTHROID, LEVOTHROID) 25 MCG tablet Take 25 mcg by mouth daily before breakfast.     . magic mouthwash SOLN Take 5 mLs by mouth 3 (three) times daily. Swish and spit    . metoprolol tartrate (LOPRESSOR) 25 MG tablet Take 1 tablet (25 mg total) by mouth 2 (two) times daily. 60 tablet 2  . nystatin (MYCOSTATIN) 100000 UNIT/ML suspension Take 5 mLs by mouth 4 (four) times daily.    . ondansetron (ZOFRAN-ODT) 4 MG disintegrating tablet Take 4 mg by mouth every 8 (eight) hours as needed for nausea or vomiting.    . senna-docusate (SENOKOT-S) 8.6-50 MG tablet Take 2 tablets by mouth at bedtime. 60 tablet 2  . simvastatin (ZOCOR) 40 MG tablet Take 1 tablet (40 mg total) by mouth daily at 6 PM. 30 tablet 3  . venlafaxine XR (EFFEXOR-XR) 150 MG 24 hr capsule Take 150 mg by mouth at bedtime.      Allergies:  Allergies as of 11/05/2017 - Review Complete 11/05/2017  Allergen Reaction  Noted  . Clopidogrel Nausea And Vomiting 10/12/2017  . Omeprazole Other (See Comments) 10/12/2017    ROS:  A 15 point review of systems was performed and pertinent positives and negatives noted in HPI    Objective:     BP (!) 149/61 (BP Location: Right Arm)   Pulse 79   Temp (!) 97.3 F (36.3 C) (Oral)   Resp 18   Ht 5' (1.524 m)   Wt 54.5 kg   SpO2 92%   BMI 23.47 kg/m    Constitutional :  alert, cooperative, appears stated age and no distress  Lymphatics/Throat:  no asymmetry, masses, or scars  Respiratory:  clear to auscultation bilaterally  Cardiovascular:   regular rate and rhythm  Gastrointestinal: soft, non-tender; bowel sounds normal; no masses,  no organomegaly.   Skin: Cool and moisT  Psychiatric: Normal affect, non-agitated, not confused       LABS:  CMP Latest Ref Rng & Units 11/09/2017 11/08/2017 11/06/2017  Glucose 70 - 99 mg/dL 86 104(H) 100(H)  BUN 8 - 23 mg/dL 11 15 31(H)  Creatinine 0.44 - 1.00 mg/dL 0.94 0.93 1.67(H)  Sodium 135 - 145 mmol/L 142 142 138  Potassium 3.5 - 5.1 mmol/L 3.6 3.6 4.7  Chloride 98 - 111 mmol/L 111 110 105  CO2 22 - 32 mmol/L 26 22 23   Calcium 8.9 - 10.3 mg/dL 8.7(L) 8.9 8.9  Total Protein 6.5 - 8.1 g/dL - - 6.5  Total Bilirubin 0.3 - 1.2 mg/dL - - 0.6  Alkaline Phos 38 - 126 U/L - - 69  AST 15 - 41 U/L - - 22  ALT 0 - 44 U/L - - 13   CBC Latest Ref Rng & Units 11/08/2017 11/06/2017 11/05/2017  WBC 3.6 - 11.0 K/uL 11.1(H) 14.1(H) 15.1(H)  Hemoglobin 12.0 - 16.0 g/dL 12.7 13.0 13.9  Hematocrit 35.0 - 47.0 % 38.3 38.2 41.8  Platelets 150 - 440 K/uL 207 203 214     RADS: CLINICAL DATA:  Nausea and vomiting.  EXAM: CT ABDOMEN AND PELVIS WITHOUT CONTRAST  TECHNIQUE: Multidetector CT imaging of the abdomen and pelvis was performed following the standard protocol without IV contrast.  COMPARISON:  None.  FINDINGS: Lower chest: Lung bases are essentially clear. There may be tiny vague nodular densities in right middle lobe which are likely incidental findings. Coronary artery calcifications. Descending thoracic aorta is heavily calcified and ectatic measuring up to 3.3 cm.  Hepatobiliary: Intrahepatic and extrahepatic biliary dilatation. Evidence for an obstructing stone in the distal common bile duct region measuring roughly 1 cm. This is best seen on the coronal reformats, sequence 5, image 41. Common bile duct measures roughly 1.2 cm. Mild distention of the gallbladder. Intrahepatic biliary dilatation appears to be most prominent in the left hepatic lobe.  Pancreas: No significant  inflammation around the pancreas.  Spleen: Low-density mildly exophytic structure involving the inferior aspect of the spleen measures 1.9 cm and nonspecific.  Adrenals/Urinary Tract: Adrenal glands are within normal limits. Evidence for chronic atrophy in the left kidney with numerous left renal cysts and left hydroureteronephrosis. Left ureter is dilated down to the pelvis. The distal left ureter is not well visualized and may be atretic. High-density material within the left renal collecting system is probably related to chronic stasis. Evidence for a complex cyst involving the right kidney lower pole that measures up to 5.2 cm. Mild dilatation of the right renal pelvis. Right kidney is slightly malrotated. No significant dilatation of the right ureter. Moderate  distention of the urinary bladder.  Stomach/Bowel: Fat stranding around the rectum is nonspecific. Moderate sized hiatal hernia. No evidence for bowel obstruction. No focal bowel inflammation.  Vascular/Lymphatic: The abdominal aorta is heavily calcified. The origin of the SMA is heavily calcified. Evidence for stenosis involving the origin of the celiac trunk. Diffuse atherosclerotic disease in the common iliac arteries. No significant lymph node enlargement in the abdomen or pelvis.  Reproductive: Status post hysterectomy. No adnexal masses.  Other: Stranding in the perirectal region but no significant free fluid. Negative for free air.  Musculoskeletal: Anterior wedge compression deformity involving the T12 vertebral body of unknown age. There is probably a compression deformity involving the inferior endplate of L1. Disc space narrowing and disease at L2-L3.  IMPRESSION: 1. Biliary obstruction. Evidence for choledocholithiasis with a large stone in the distal common bile duct. There is intrahepatic and extrahepatic biliary dilatation. 2. Chronic obstruction in the distal left ureter of unknown etiology.  There is severe atrophy in the left kidney with multiple left renal cysts. 3. Indeterminate 1.9 cm low-density structure in the spleen. Probably an incidental finding. 4. Large right renal cyst. 5. Severe atherosclerotic disease involving the aorta and visceral arteries. Suspect significant stenosis involving the SMA and possibly the celiac trunk. 6. Compression fractures at T12 and L1 of unknown age.   Electronically Signed   By: Markus Daft M.D.   On: 10/30/2017 13:39 Assessment:      Incidental choledocholithiasis status post ERCP with sphincterotomy and stone extraction.  History of stroke 4 weeks ago  Plan:     Due to the recent stroke history patient is currently at a very high risk for surgical complications secondary to the stroke.  Despite the increased rate she is asked for recurrent choledocholithiasis leading to possible cholangitis at this point I believe the risks of undergoing elective lap chole is greater than the risk of recurrent common bile duct obstruction.  We will continue to monitor on an outpatient basis until ideally 6 months from her stroke which at that time will be safe to undergo any sort of elective lap chole.  Explained this in detail to the patient as well as family members at bedside and all are in agreement to delay elective surgery as long as possible.   Office information given to patient family members to make an appointment at their own convenience once discharged.  Please call surgery for any further questions while the patient is in-house.

## 2017-11-10 NOTE — Transfer of Care (Signed)
Immediate Anesthesia Transfer of Care Note  Patient: Sonya [name]  Procedure(s) Performed: ENDOSCOPIC RETROGRADE CHOLANGIOPANCREATOGRAPHY (ERCP) WITH PROPOFOL (N/A )  Patient Location: PACU  Anesthesia Type:General  Level of Consciousness: awake and sedated  Airway & Oxygen Therapy: Patient Spontanous Breathing and Patient connected to nasal cannula oxygen  Post-op Assessment: Report given to RN and Post -op Vital signs reviewed and stable  Post vital signs: Reviewed and stable  Last Vitals:  Vitals Value Taken Time  BP    Temp    Pulse    Resp    SpO2      Last Pain:  Vitals:   11/10/17 0427  TempSrc: Oral  PainSc:       Patients Stated Pain Goal: 0 (43/[id number])  Complications: No apparent anesthesia complications

## 2017-11-10 NOTE — Progress Notes (Signed)
PT Cancellation Note  Patient Details Name: Nyasiah Moffet MRN: [id number] DOB: 13-Feb-1933   Cancelled Treatment:    Reason Eval/Treat Not Completed: Fatigue/lethargy limiting ability to participate; AM session attempted with pt out of room at procedure, PM session attempted upon pt return with pt unable to participate with PT services secondary to lethargy.  Will attempt to see pt at a future date as medically appropriate.     Linus Salmons PT, DPT 11/10/17, 2:20 PM

## 2017-11-10 NOTE — Plan of Care (Signed)
Resting quietly in bed with eyes closed.Callbell within reach,will continue with planned regimen.

## 2017-11-11 ENCOUNTER — Inpatient Hospital Stay: Payer: Medicare PPO

## 2017-11-11 LAB — CBC
HEMATOCRIT: 32.5 % — AB (ref 35.0–47.0)
Hemoglobin: 11.1 g/dL — ABNORMAL LOW (ref 12.0–16.0)
MCH: 28.9 pg (ref 26.0–34.0)
MCHC: 34.1 g/dL (ref 32.0–36.0)
MCV: 84.6 fL (ref 80.0–100.0)
PLATELETS: 212 10*3/uL (ref 150–440)
RBC: 3.84 MIL/uL (ref 3.80–5.20)
RDW: 14.3 % (ref 11.5–14.5)
WBC: 9.9 10*3/uL (ref 3.6–11.0)

## 2017-11-11 LAB — COMPREHENSIVE METABOLIC PANEL
ALBUMIN: 2 g/dL — AB (ref 3.5–5.0)
ALT: 10 U/L (ref 0–44)
AST: 23 U/L (ref 15–41)
Alkaline Phosphatase: 62 U/L (ref 38–126)
Anion gap: 5 (ref 5–15)
BILIRUBIN TOTAL: 0.5 mg/dL (ref 0.3–1.2)
BUN: 11 mg/dL (ref 8–23)
CHLORIDE: 113 mmol/L — AB (ref 98–111)
CO2: 24 mmol/L (ref 22–32)
CREATININE: 0.82 mg/dL (ref 0.44–1.00)
Calcium: 7.9 mg/dL — ABNORMAL LOW (ref 8.9–10.3)
GFR calc Af Amer: >60 mL/min (ref >60)
GFR calc non Af Amer: >60 mL/min (ref >60)
GLUCOSE: 79 mg/dL (ref 70–99)
Potassium: 3.6 mmol/L (ref 3.5–5.1)
Sodium: 142 mmol/L (ref 135–145)
Total Protein: 4.4 g/dL — ABNORMAL LOW (ref 6.5–8.1)

## 2017-11-11 LAB — LIPASE, BLOOD: Lipase: 25 U/L (ref 11–51)

## 2017-11-11 MED ORDER — APIXABAN 5 MG PO TABS: 5.0000 mg | ORAL_TABLET | Freq: Two times a day (BID) | ORAL | Status: DC

## 2017-11-11 MED ORDER — APIXABAN 5 MG PO TABS
10.0000 mg | ORAL_TABLET | Freq: Two times a day (BID) | ORAL | Status: DC
  Administered 2017-11-11 – 2017-11-16 (×10): 10 mg via ORAL
  Filled 2017-11-11 (×10): qty 2

## 2017-11-11 NOTE — Progress Notes (Signed)
RN was called into pt.'s room by pt due to left leg hurting, RN assessed leg and saw left leg swollen and warm to touch. MD notified, orders placed by MD. Schedule tylenol was given for pain. RN will continue to monitor pt.   Sonya [name] [employer]

## 2017-11-11 NOTE — Progress Notes (Signed)
Verbal order to discontinue discharge order, facility updated that pt is not being discharged.   Brandice Busser [employer]

## 2017-11-11 NOTE — Discharge Summary (Signed)
[facility] [place] NAME: Sonya [name]    MR#:  [id number]  DATE OF BIRTH:  October 24, 1932  DATE OF ADMISSION:  11/05/2017 ADMITTING PHYSICIAN: Sela Hua, MD  DATE OF DISCHARGE: 11/11/2017   PRIMARY CARE PHYSICIAN: Baxter Hire, MD    ADMISSION DIAGNOSIS:  AKI (acute kidney injury) ([place]) [N17.9] Non-intractable vomiting with nausea, unspecified vomiting type [R11.2]  DISCHARGE DIAGNOSIS:  Principal Problem:   AKI (acute kidney injury) ([place]) Active Problems:   Hypertension   Hypothyroidism   Choledocholithiasis   Nausea & vomiting   Problems with swallowing and mastication   Acute gastritis without hemorrhage   Stricture and stenosis of esophagus   Calculus of common duct without obstruction   Abnormal findings on diagnostic imaging of liver   SECONDARY DIAGNOSIS:   Past Medical History:  Diagnosis Date  . CAD (coronary artery disease)   . Carpal tunnel syndrome   . GERD (gastroesophageal reflux disease)   . History of shingles   . Hormone receptor positive breast cancer ([place])   . Hyperlipemia   . Hypertension   . Stroke [facility])     HOSPITAL COURSE:   84 year old female with past medical history of breast cancer, stroke, hypertension, hyperlipidemia and GERD, history of coronary artery disease who presented to the hospital due to abdominal pain and nausea.  1.  Abdominal pain/nausea- suspected to be secondary to underlying constipation combined with gastritis/esophageal stricture.  Patient was seen by gastroenterology underwent an upper GI endoscopy and underwent dilatation of the GE junction stenosis. -Initially patient had some persistent nausea but this improved with scheduled Zofran.  She received some IV Pepcid with also help with this.  She is now tolerating p.o. well without any further worsening abdominal pain or nausea and therefore being discharged.   2.  Acute kidney injury-secondary to dehydration from  ongoing nausea and abdominal pain. -This has improved and resolved with IV fluid hydration.  Patient can now resume her antihypertensives including ACE inhibitors.  3. Choledocholithiasis- patient's LFTs and lipase and alkaline phosphatase were stable but on her previous hospitalization she was noted to have choledocholithiasis. Seen by GI and status post ERCP on 11/10/17 with sphincterotomy and extraction of stones.  Patient did have a filling defect based on cholangiogram.   -Gastroenterology recommended surgical evaluation for cholecystectomy.  Patient was seen by Dr. Lysle Pearl and after his evaluation given her recent stroke and multiple comorbidities he thinks surgery is presently high risk.  Patient is clinically asymptomatic now and therefore will be discharged with outpatient surgical follow-up to see if this cholecystectomy can be done in the near future.  4. Constipation - resolved. Improved w/ Lactulose and Dulcolax.   5.  Hypothyroidism- pt. Will continue Synthroid.  6.  Hyperlipidemia-continue simvastatin.  7. Depression - cont. Effexor.    Pt. Is stable to be discharged to SNF today.   DISCHARGE CONDITIONS:   Stable.   CONSULTS OBTAINED:  Treatment Team:  Lucilla Lame, MD Jonathon Bellows, MD Benjamine Sprague, DO  DRUG ALLERGIES:   Allergies  Allergen Reactions  . Clopidogrel Nausea And Vomiting     reported by [place] 11/24/13  . Omeprazole Other (See Comments)    Unknown reaction - reported by [facility] [place] 11/24/13    DISCHARGE MEDICATIONS:   Allergies as of 11/11/2017      Reactions   Clopidogrel Nausea And Vomiting    reported by [facility] 11/24/13  Omeprazole Other (See Comments)   Unknown reaction - reported by [facility] 11/24/13      Medication List    TAKE these medications   acetaminophen 325 MG tablet Commonly known as:  TYLENOL Take 2 tablets (650 mg total) by mouth every 4  (four) hours as needed for mild pain (or temp > 37.5 C (99.5 F)).   amLODipine 10 MG tablet Commonly known as:  NORVASC Take 1 tablet (10 mg total) by mouth daily.   aspirin 81 MG EC tablet Take 1 tablet (81 mg total) by mouth daily.   b complex vitamins tablet Take 1 tablet by mouth daily.   benazepril 20 MG tablet Commonly known as:  LOTENSIN Take 20 mg by mouth at bedtime.   cloNIDine 0.2 MG tablet Commonly known as:  CATAPRES Take 1 tablet (0.2 mg total) by mouth 2 (two) times daily.   DUCODYL 5 MG EC tablet Generic drug:  bisacodyl Take 10 mg by mouth daily.   ibuprofen 400 MG tablet Commonly known as:  ADVIL,MOTRIN Take 1 tablet (400 mg total) by mouth every 8 (eight) hours as needed for moderate pain.   lansoprazole 15 MG capsule Commonly known as:  PREVACID Take 15 mg by mouth at bedtime.   levothyroxine 25 MCG tablet Commonly known as:  SYNTHROID, LEVOTHROID Take 25 mcg by mouth daily before breakfast.   magic mouthwash Soln Take 5 mLs by mouth 3 (three) times daily. Swish and spit   metoprolol tartrate 25 MG tablet Commonly known as:  LOPRESSOR Take 1 tablet (25 mg total) by mouth 2 (two) times daily.   nystatin 100000 UNIT/ML suspension Commonly known as:  MYCOSTATIN Take 5 mLs by mouth 4 (four) times daily.   ondansetron 4 MG disintegrating tablet Commonly known as:  ZOFRAN-ODT Take 4 mg by mouth every 8 (eight) hours as needed for nausea or vomiting.   senna-docusate 8.6-50 MG tablet Commonly known as:  Senokot-S Take 2 tablets by mouth at bedtime.   simvastatin 40 MG tablet Commonly known as:  ZOCOR Take 1 tablet (40 mg total) by mouth daily at 6 PM.   venlafaxine XR 150 MG 24 hr capsule Commonly known as:  EFFEXOR-XR Take 150 mg by mouth at bedtime.   Vitamin D3 1000 units Caps Take 3,000 Units by mouth at bedtime.         DISCHARGE INSTRUCTIONS:   DIET:  Cardiac diet  DISCHARGE CONDITION:  Stable  ACTIVITY:  Activity as  tolerated  OXYGEN:  Home Oxygen: No.   Oxygen Delivery: room air  DISCHARGE LOCATION:  nursing home   If you experience worsening of your admission symptoms, develop shortness of breath, life threatening emergency, suicidal or homicidal thoughts you must seek medical attention immediately by calling 911 or calling your MD immediately  if symptoms less severe.  You Must read complete instructions/literature along with all the possible adverse reactions/side effects for all the Medicines you take and that have been prescribed to you. Take any new Medicines after you have completely understood and accpet all the possible adverse reactions/side effects.   Please note  You were cared for by a hospitalist during your hospital stay. If you have any questions about your discharge medications or the care you received while you were in the hospital after you are discharged, you can call the unit and asked to speak with the hospitalist on call if the hospitalist that took care of you is not available. Once you are discharged,  your primary care physician will handle any further medical issues. Please note that NO REFILLS for any discharge medications will be authorized once you are discharged, as it is imperative that you return to your primary care physician (or establish a relationship with a primary care physician if you do not have one) for your aftercare needs so that they can reassess your need for medications and monitor your lab values.     Today   Patient denies any worsening nausea, abdominal pain.  Tolerating p.o. well.  Status post ERCP with extraction of stones and sphincterotomy yesterday.  Lipase normal LFTs are normal.  Seen by general surgery no plans for acute surgical intervention but outpatient follow-up for now.  Will discharge to skilled nursing facility today.  VITAL SIGNS:  Blood pressure (!) 157/61, pulse 66, temperature 97.6 F (36.4 C), temperature source Oral, resp. rate  16, height 5' (1.524 m), weight 54.5 kg, SpO2 96 %.  I/O:    Intake/Output Summary (Last 24 hours) at 11/11/2017 1011 Last data filed at 11/11/2017 0400 Gross per 24 hour  Intake 853.48 ml  Output 1100 ml  Net -246.52 ml    PHYSICAL EXAMINATION:   GENERAL:  84 y.o.-year-old patient lying in bed in NAD EYES: Pupils equal, round, reactive to light and accommodation. No scleral icterus. Extraocular muscles intact.  HEENT: Head atraumatic, normocephalic. Oropharynx and nasopharynx clear.  NECK:  Supple, no jugular venous distention. No thyroid enlargement, no tenderness.  LUNGS: Normal breath sounds bilaterally, no wheezing, rales, rhonchi. No use of accessory muscles of respiration.  CARDIOVASCULAR: S1, S2 normal. No murmurs, rubs, or gallops.  ABDOMEN: Soft, nontender, nondistended. Bowel sounds present. No organomegaly or mass.  EXTREMITIES: No cyanosis, clubbing or edema b/l.    NEUROLOGIC: Cranial nerves II through XII are intact. Left sided hemiparesis due to previous CVA.   PSYCHIATRIC: The patient is alert and oriented x 3.  SKIN: No obvious rash, lesion, or ulcer.   DATA REVIEW:   CBC Recent Labs  Lab 11/11/17 0544  WBC 9.9  HGB 11.1*  HCT 32.5*  PLT 212    Chemistries  Recent Labs  Lab 11/11/17 0544  NA 142  K 3.6  CL 113*  CO2 24  GLUCOSE 79  BUN 11  CREATININE 0.82  CALCIUM 7.9*  AST 23  ALT 10  ALKPHOS 62  BILITOT 0.5    Cardiac Enzymes Recent Labs  Lab 11/05/17 2220  TROPONINI <0.03    Microbiology Results  Results for orders placed or performed during the hospital encounter of 10/30/17  Urine culture     Status: None   Collection Time: 10/30/17 11:32 AM  Result Value Ref Range Status   Specimen Description   Final    URINE, RANDOM Performed at [facility], [address], [place], [place] [address]    Special Requests   Final    NONE Performed at [facility], [address], [place], [place] [address]     Culture   Final    NO GROWTH Performed at [facility], [place] [address], [place], [place] [address]    Report Status 10/31/2017 FINAL  Final  MRSA PCR Screening     Status: Abnormal   Collection Time: 10/30/17  6:37 PM  Result Value Ref Range Status   MRSA by PCR POSITIVE (A) NEGATIVE Final    Comment:        The GeneXpert MRSA Assay (FDA approved for NASAL specimens only), is one  component of a comprehensive MRSA colonization surveillance program. It is not intended to diagnose MRSA infection nor to guide or monitor treatment for MRSA infections. RESULT CALLED TO, READ BACK BY AND VERIFIED WITH: MARSHA HATCH @1952  10/30/17 AKT Performed at [facility], [address] [place], [place] [address]     RADIOLOGY:  Dg C-arm 1-60 Min-no Report  Result Date: 11/10/2017 Fluoroscopy was utilized by the requesting physician.  No radiographic interpretation.      Management plans discussed with the patient, family and they are in agreement.  CODE STATUS:     Code Status Orders  (From admission, onward)         Start     Ordered   11/06/17 0040  Do not attempt resuscitation (DNR)  Continuous    Question Answer Comment  In the event of cardiac or respiratory ARREST Do not call a "code blue"   In the event of cardiac or respiratory ARREST Do not perform Intubation, CPR, defibrillation or ACLS   In the event of cardiac or respiratory ARREST Use medication by any route, position, wound care, and other measures to relive pain and suffering. May use oxygen, suction and manual treatment of airway obstruction as needed for comfort.      11/06/17 0039        TOTAL TIME TAKING CARE OF THIS PATIENT: 40 minutes.    Henreitta Leber M.D on 11/11/2017 at 10:11 AM  Between 7am to 6pm - Pager - [phone]  After 6pm go to [email or web address] - Proofreader  [facility]  Office  [phone]  CC: Primary care physician; Baxter Hire, MD

## 2017-11-11 NOTE — Clinical Social Work Note (Signed)
Patient's discharge has been cancelled as she now has a DVT. Re-auth will be required. When possible, patient will have to have PT re-assess for insurance auth. Shela Leff MSW,LCSW [phone]

## 2017-11-11 NOTE — Consult Note (Signed)
ANTICOAGULATION CONSULT NOTE - Initial Consult  Pharmacy Consult for Apixaban Dosing  Indication: DVT  Allergies  Allergen Reactions  . Clopidogrel Nausea And Vomiting     reported by [place] 11/24/13  . Omeprazole Other (See Comments)    Unknown reaction - reported by [place] 11/24/13   Patient Measurements: Height: 5' (152.4 cm) Weight: 120 lb 2.4 oz (54.5 kg) IBW/kg (Calculated) : 45.5   Vital Signs: BP: 157/61 (09/25 1003)  Labs: Recent Labs    11/09/17 0631 11/11/17 0544  HGB  --  11.1*  HCT  --  32.5*  PLT  --  212  CREATININE 0.94 0.82    Estimated Creatinine Clearance: 37.3 mL/min (by C-G formula based on SCr of 0.82 mg/dL).   Assessment: Pharmacy consulted for apxiaban (Eliquis) dosing and monitoring in 84 yo female with DVT.   US Venous LL: Positive for deep venous thrombosis in the left lower extremity. Thrombus involving the left common femoral vein, profunda femoralvein, femoral vein, popliteal vein and deep calf veins.    Plan:  Start apixaban (Eliquis) 10 mg BID x 7 days (total 14 doses), followed by apixaban 5mg  BID thereafter.   Pernell Dupre, PharmD, BCPS Clinical Pharmacist 11/11/2017 5:12 PM

## 2017-11-11 NOTE — Clinical Social Work Note (Signed)
Evette at [facility] confirmed that patient's auth from the 9/23 is still good. Patient to return to [facility] today. Discharge information sent. Patient to transport via EMS. Shela Leff MSW,LCSW [phone]

## 2017-11-11 NOTE — Progress Notes (Signed)
Pt. Was noted to have extensive LLE DVT.    Pt. To be started on Eliquis.  Discharge to SNF cancelled.  Discussed with family over the phone and nursing staff aware.   Social Work also made aware.

## 2017-11-11 NOTE — Progress Notes (Signed)
MD notified of results of ultrasound of left leg is positive for DVT of left leg. MD talked to family and pt to update them on her status. Verbal orders to place pharmacy consult on Eliquis. Pt has no complaints at this time and states her "pain is minimal in her left leg" RN educated pt and pt.'s family over the precautions that needed to be taken for a DVT. RN will closely watch pt.   Seraj Dunnam [employer]

## 2017-11-12 ENCOUNTER — Inpatient Hospital Stay: Payer: Medicare PPO

## 2017-11-12 ENCOUNTER — Encounter: Payer: Self-pay | Admitting: Gastroenterology

## 2017-11-12 LAB — CBC
HCT: 32.9 % — ABNORMAL LOW (ref 35.0–47.0)
Hemoglobin: 11.4 g/dL — ABNORMAL LOW (ref 12.0–16.0)
MCH: 29.4 pg (ref 26.0–34.0)
MCHC: 34.6 g/dL (ref 32.0–36.0)
MCV: 84.9 fL (ref 80.0–100.0)
PLATELETS: 229 10*3/uL (ref 150–440)
RBC: 3.88 MIL/uL (ref 3.80–5.20)
RDW: 13.8 % (ref 11.5–14.5)
WBC: 8.4 10*3/uL (ref 3.6–11.0)

## 2017-11-12 MED ORDER — DOCUSATE SODIUM 100 MG PO CAPS
100.0000 mg | ORAL_CAPSULE | Freq: Two times a day (BID) | ORAL | Status: DC
  Administered 2017-11-12 – 2017-11-16 (×8): 100 mg via ORAL
  Filled 2017-11-12 (×9): qty 1

## 2017-11-12 NOTE — Progress Notes (Signed)
Physical Therapy Treatment Patient Details Name: Sonya [name] MRN: [id number] DOB: Jul 07, 1932 Today's Date: 11/12/2017    History of Present Illness pt is a 84 y.o F admitted on 11/05/2017 with dx of acute kidney injury and CC of nausea and vomiting. She was previously discharged 48 hours ago with dx of cholelithiasis and chronic vomitting. She has a hx stroke, HTN, and currently reports chronic low back pain.     PT Comments    Chart reviewed.  New extensive DVT noted.  Discussed with K. Owens Shark PT who discussed with Dr. Verdell Carmine.  OK given to continue with therapy as anticoagulants had been started last night.    BP checked prior to session and was within PT protocols. Participated in exercises as described below.  Pt with good effort with exercises.  Agrees to edge of bed.  Pt requires max a for transitions.  Back pain noted and limits mobility.  She needed increased time and cues to allow for hip flexion to come fully upright.  She was able to sit for 5 minutes before L and post push/lean became too difficulty to keep pt upright with max +1 assist.  She returned to supine with max a x 1 and to reposition in bed for comfort.   Follow Up Recommendations  SNF     Equipment Recommendations       Recommendations for Other Services       Precautions / Restrictions Precautions Precautions: Fall Precaution Comments: Nectar liquids Restrictions Weight Bearing Restrictions: No    Mobility  Bed Mobility Overal bed mobility: Needs Assistance Bed Mobility: Rolling;Supine to Sit;Sit to Supine Rolling: Max assist Sidelying to sit: Max assist Supine to sit: Max assist Sit to supine: Max assist   General bed mobility comments: once sitting on EOB she demonstrates pushing to the L and required Max assist for seated balance  Transfers                 General transfer comment: Hoyer/lift appropriate due to poor sitting balance and pushing.  Son stated lift was used at rehab for  transfers.  Ambulation/Gait             General Gait Details: unsafe/unable at this time   Stairs             Wheelchair Mobility    Modified Rankin (Stroke Patients Only)       Balance Overall balance assessment: Needs assistance   Sitting balance-Leahy Scale: Poor Sitting balance - Comments: pushing behaviors towards L                                    Cognition Arousal/Alertness: Awake/alert Behavior During Therapy: WFL for tasks assessed/performed Overall Cognitive Status: Impaired/Different from baseline                                        Exercises Other Exercises Other Exercises: BLE exersices in supine AAROM - ankle pumps, heel slides, ab/add and SLR.  Pt with limited AAROM LLE.  x 10 reps Other Exercises: Sitting x 5 minutes edge of bed.    General Comments        Pertinent Vitals/Pain Pain Assessment: Faces Faces Pain Scale: Hurts whole lot Pain Location: chronic back pain Pain Descriptors / Indicators: Aching;Sore Pain Intervention(s): Limited activity within patient's tolerance;Monitored during  session    Home Living                      Prior Function            PT Goals (current goals can now be found in the care plan section) Progress towards PT goals: Progressing toward goals    Frequency    Min 2X/week      PT Plan Current plan remains appropriate    Co-evaluation              AM-PAC PT "6 Clicks" Daily Activity  Outcome Measure  Difficulty turning over in bed (including adjusting bedclothes, sheets and blankets)?: Unable Difficulty moving from lying on back to sitting on the side of the bed? : Unable Difficulty sitting down on and standing up from a chair with arms (e.g., wheelchair, bedside commode, etc,.)?: Unable Help needed moving to and from a bed to chair (including a wheelchair)?: Total Help needed walking in hospital room?: Total Help needed climbing 3-5  steps with a railing? : Total 6 Click Score: 6    End of Session   Activity Tolerance: Patient tolerated treatment well Patient left: in bed;with bed alarm set;with call bell/phone within reach;with family/visitor present   Hemiplegia - Right/Left: Left Hemiplegia - dominant/non-dominant: Non-dominant Hemiplegia - caused by: Cerebral infarction     Time: 1000-1024 PT Time Calculation (min) (ACUTE ONLY): 24 min  Charges:  $Therapeutic Exercise: 8-22 mins $Therapeutic Activity: 8-22 mins                     Chesley Noon, PTA 11/12/17, 10:34 AM

## 2017-11-12 NOTE — Progress Notes (Signed)
MD made aware that pt.'s left foot is cool and her toes are purple. Left leg is still swollen and calf is warm to touch. Pt states she can still feel when RN touch her foot, however, RN used a doppler to be able to find her dorsal pedis pulse. No verbal orders given at this time. MD aware.   Cheronda Erck [employer]

## 2017-11-12 NOTE — Progress Notes (Signed)
[facility] NAME: Sonya [name]    MR#:  [id number]  DATE OF BIRTH:  Jun 06, 1932  SUBJECTIVE:   No nausea or vomiting or abdominal pain.  Patient was noted to have significant swelling of her left lower extremity and noted to have an extensive DVT.  Now complaining of left upper extremity swelling and has a superficial thrombus on the left upper extremity too. Tolerating PO well.   REVIEW OF SYSTEMS:    Review of Systems  Constitutional: Negative for chills and fever.  HENT: Negative for congestion and tinnitus.   Eyes: Negative for blurred vision and double vision.  Respiratory: Negative for cough, shortness of breath and wheezing.   Cardiovascular: Negative for chest pain, orthopnea and PND.  Gastrointestinal: Negative for abdominal pain, diarrhea, nausea and vomiting.  Genitourinary: Negative for dysuria and hematuria.  Neurological: Negative for dizziness, sensory change and focal weakness.  All other systems reviewed and are negative.   Nutrition: Heart Healthy Tolerating Diet: yes Tolerating PT: Eval noted.   DRUG ALLERGIES:   Allergies  Allergen Reactions  . Clopidogrel Nausea And Vomiting     reported by [facility] 11/24/13  . Omeprazole Other (See Comments)    Unknown reaction - reported by [place] 11/24/13    VITALS:  Blood pressure (!) 144/107, pulse 85, temperature 98.5 F (36.9 C), temperature source Oral, resp. rate 20, height 5' (1.524 m), weight 54.5 kg, SpO2 95 %.  PHYSICAL EXAMINATION:   Physical Exam  GENERAL:  84 y.o.-year-old patient lying in bed in NAD EYES: Pupils equal, round, reactive to light and accommodation. No scleral icterus. Extraocular muscles intact.  HEENT: Head atraumatic, normocephalic. Oropharynx and nasopharynx clear.  NECK:  Supple, no jugular venous distention. No thyroid enlargement, no tenderness.  LUNGS: Normal breath sounds  bilaterally, no wheezing, rales, rhonchi. No use of accessory muscles of respiration.  CARDIOVASCULAR: S1, S2 normal. No murmurs, rubs, or gallops.  ABDOMEN: Soft, nontender, nondistended. Bowel sounds present. No organomegaly or mass.  EXTREMITIES: No cyanosis, clubbing, LLE swelling > right. Left leg warm to touch.   NEUROLOGIC: Cranial nerves II through XII are intact. Left sided hemiparesis due to previous CVA.   PSYCHIATRIC: The patient is alert and oriented x 3.  SKIN: No obvious rash, lesion, or ulcer.    LABORATORY PANEL:   CBC Recent Labs  Lab 11/12/17 0602  WBC 8.4  HGB 11.4*  HCT 32.9*  PLT 229   ------------------------------------------------------------------------------------------------------------------  Chemistries  Recent Labs  Lab 11/11/17 0544  NA 142  K 3.6  CL 113*  CO2 24  GLUCOSE 79  BUN 11  CREATININE 0.82  CALCIUM 7.9*  AST 23  ALT 10  ALKPHOS 62  BILITOT 0.5   ------------------------------------------------------------------------------------------------------------------  Cardiac Enzymes Recent Labs  Lab 11/05/17 2220  TROPONINI <0.03   ------------------------------------------------------------------------------------------------------------------  RADIOLOGY:  US Venous Img Lower Unilateral Left  Result Date: 11/11/2017 CLINICAL DATA:  84 year old with left leg swelling. EXAM: LEFT LOWER EXTREMITY VENOUS DOPPLER ULTRASOUND TECHNIQUE: Gray-scale sonography with graded compression, as well as color Doppler and duplex ultrasound were performed to evaluate the lower extremity deep venous systems from the level of the common femoral vein and including the common femoral, femoral, profunda femoral, popliteal and calf veins including the posterior tibial, peroneal and gastrocnemius veins when visible. The superficial great saphenous vein was also interrogated. Spectral Doppler was utilized to evaluate flow at rest and with distal  augmentation  maneuvers in the common femoral, femoral and popliteal veins. COMPARISON:  None. FINDINGS: Contralateral Common Femoral Vein: Respiratory phasicity is normal and symmetric with the symptomatic side. No evidence of thrombus. Normal compressibility. Common Femoral Vein: Positive for thrombus. Incomplete compressibility of the left common femoral vein with nonocclusive thrombus. Saphenofemoral Junction: No evidence of thrombus. Normal flow on color Doppler imaging. Profunda Femoral Vein: Positive for thrombus. Occlusive thrombus in the left profunda femoral vein. Femoral Vein: Positive for thrombus. Occlusive thrombus in the left femoral vein. Popliteal Vein: Positive for thrombus. Occlusive thrombus in the left popliteal vein. Calf Veins: Positive for thrombus in the gastrocnemius veins. Probable thrombus in 1 of the posterior tibial veins. Other Findings:  None. IMPRESSION: Positive for deep venous thrombosis in the left lower extremity. Thrombus involving the left common femoral vein, profunda femoral vein, femoral vein, popliteal vein and deep calf veins. Electronically Signed   By: Markus Daft M.D.   On: 11/11/2017 16:38   US Venous Img Upper Uni Left  Result Date: 11/12/2017 CLINICAL DATA:  84 year old female with left upper extremity swelling. Recent diagnosis of left lower extremity DVT. EXAM: LEFT UPPER EXTREMITY VENOUS DOPPLER ULTRASOUND TECHNIQUE: Gray-scale sonography with graded compression, as well as color Doppler and duplex ultrasound were performed to evaluate the upper extremity deep venous system from the level of the subclavian vein and including the jugular, axillary, basilic, radial, ulnar and upper cephalic vein. Spectral Doppler was utilized to evaluate flow at rest and with distal augmentation maneuvers. COMPARISON:  None. FINDINGS: Contralateral Subclavian Vein: Respiratory phasicity is normal and symmetric with the symptomatic side. No evidence of thrombus. Normal  compressibility. Internal Jugular Vein: No evidence of thrombus. Normal compressibility, respiratory phasicity and response to augmentation. Subclavian Vein: No evidence of thrombus. Normal compressibility, respiratory phasicity and response to augmentation. Axillary Vein: No evidence of thrombus. Normal compressibility, respiratory phasicity and response to augmentation. Cephalic Vein: No evidence of thrombus. Normal compressibility, respiratory phasicity and response to augmentation. Basilic Vein: Noncompressible beginning just above the antecubital fossa and extending into the mid upper arm. No evidence of color flow in this venous segment on color Doppler ultrasound. The more central basilic vein remains patent with normal color flow. Brachial Veins: No evidence of thrombus. Normal compressibility, respiratory phasicity and response to augmentation. Radial Veins: No evidence of thrombus. Normal compressibility, respiratory phasicity and response to augmentation. Ulnar Veins: No evidence of thrombus. Normal compressibility, respiratory phasicity and response to augmentation. Venous Reflux:  None visualized. Other Findings:  Extensive subcutaneous edema. IMPRESSION: Positive for superficial venous thrombosis involving a segment of the left basilic vein from just above the elbow into the mid upper arm. Electronically Signed   By: Jacqulynn Cadet M.D.   On: 11/12/2017 13:26     ASSESSMENT AND PLAN:   84 year old female with past medical history of breast cancer, stroke, hypertension, hyperlipidemia and GERD, history of coronary artery disease who presented to the hospital due to abdominal pain and nausea.  1.  Abdominal pain/nausea- suspected to be secondary to underlying constipation combined with gastritis/esophageal stricture.  Patient was seen by gastroenterology underwent an upper GI endoscopy and underwent dilatation of the GE junction stenosis. - nausea improved and pt. Is tolerating PO well  now.  2.  Acute kidney injury-secondary to dehydration from ongoing nausea and abdominal pain. -Resolved and creatinine at baseline now.  Renal dose meds, avoid nephrotoxins.  3. Choledocholithiasis- patient's LFTs and lipase and alkaline phosphatase are stable.  Seen by GI and status post  ERCP on 11/10/17 with sphincterotomy and extraction of stones.  Patient did have a filling defect based on cholangiogram.   -GI recommended cholecystectomy and surgical consult was placed and seen by surgery and no plans for acute surgical intervention given her recent stroke and multiple comorbidities.  Plan for cholecystectomy done as an outpatient if possible next few weeks.  4.  DVT-patient noted to have significant swelling of her left lower extremity yesterday.  Dopplers of her lower extremities are positive for an extensive DVT.  Started on Eliquis.  Patient also has a superficial thrombus in the left upper extremity but the treatment will not change.  Keep arm elevated and supportive care.  5. Constipation - resolved. Cont. Miralax.    6.  Hypothyroidism-continue Synthroid.  7.  Neuropathy-continue gabapentin.  8.  Hyperlipidemia-continue simvastatin.  9. Depression - cont. Effexor.    Possible d/c to SNF in next 1-2 days.   All the records are reviewed and case discussed with Care Management/Social Worker. Management plans discussed with the patient, family and they are in agreement.  CODE STATUS: DNR  DVT Prophylaxis: Hep SQ  TOTAL TIME TAKING CARE OF THIS PATIENT: 30 minutes.   POSSIBLE D/C IN 1-2 DAYS, DEPENDING ON CLINICAL CONDITION.   Henreitta Leber M.D on 11/12/2017 at 2:44 PM  Between 7am to 6pm - Pager - [phone]  After 6pm go to [email or web address] - Proofreader  [facility]  Office  [phone]  CC: Primary care physician; Baxter Hire, MD

## 2017-11-13 DIAGNOSIS — I10 Essential (primary) hypertension: Secondary | ICD-10-CM

## 2017-11-13 DIAGNOSIS — I824Z2 Acute embolism and thrombosis of unspecified deep veins of left distal lower extremity: Secondary | ICD-10-CM

## 2017-11-13 DIAGNOSIS — E785 Hyperlipidemia, unspecified: Secondary | ICD-10-CM

## 2017-11-13 DIAGNOSIS — I808 Phlebitis and thrombophlebitis of other sites: Secondary | ICD-10-CM

## 2017-11-13 DIAGNOSIS — I824Y2 Acute embolism and thrombosis of unspecified deep veins of left proximal lower extremity: Secondary | ICD-10-CM

## 2017-11-13 DIAGNOSIS — N179 Acute kidney failure, unspecified: Principal | ICD-10-CM

## 2017-11-13 LAB — GLUCOSE, CAPILLARY
GLUCOSE-CAPILLARY: 79 mg/dL (ref 70–99)
GLUCOSE-CAPILLARY: 83 mg/dL (ref 70–99)

## 2017-11-13 MED ORDER — FAMOTIDINE 20 MG PO TABS
20.0000 mg | ORAL_TABLET | Freq: Two times a day (BID) | ORAL | Status: DC
  Administered 2017-11-13 – 2017-11-16 (×6): 20 mg via ORAL
  Filled 2017-11-13 (×6): qty 1

## 2017-11-13 NOTE — Consult Note (Signed)
[facility] Vascular Consult Note  MRN : [id number]  Sonya [name] is a 84 y.o. ([birth year]) female who presents with chief complaint of  Chief Complaint  Patient presents with  . Emesis   History of Present Illness:  The patient is an 84 year old female with a past medical history of coronary artery disease, carpal tunnel syndrome, GERD, history of shingles, hormone receptor positive breast cancer, hyperlipidemia, hypertension, history of stroke who presented to the [facility] emergency department with a chief complaint of "excessive vomiting".   The patient was recently admitted and found to have cholelithiasis however the plan as per GI was to undergo an outpatient evaluation with possible EGD/ERCP.  The patient notes that her symptoms had improved upon discharge however soon worsened a few days later.  The patient endorses a history of progressively worsening nausea and then multiple episodes of nonbilious vomiting.  This is what prompted her to return to the ED for further evaluation.  Upon evaluation at our emergency department she was also found to have AKI.  The patient was admitted and underwent an ERCP to remove stones in the common bile duct however a surgical consultation yielded no elective laparoscopic cholecystectomy due to her recent stroke.  Approximately 2 days ago the patient noticed an increased swelling and discomfort to the left lower extremity.  The patient underwent a venous duplex on November 11, 2017, which was notable for "positive for deep venous thrombosis in the left lower extremity. Thrombus involving the left common femoral vein, profunda femoral vein, femoral vein, popliteal vein and deep calf veins".  Yesterday the patient started to experience left upper extremity edema as well.  The patient underwent a left upper extremity venous duplex which was notable for "Positive for superficial venous thrombosis involving a  segment of the left basilic vein from just above the elbow into the mid upper arm".  The patient denies any worsening edema, discomfort or ulceration to the left upper or lower extremity.  The patient denies any shortness of breath.  The patient denies any fever, nausea vomiting.  The patient has been started on Eliquis.  The vascular surgery service was consulted by [facility] for further recommendations.  Current Facility-Administered Medications  Medication Dose Route Frequency Provider Last Rate Last Dose  . acetaminophen (TYLENOL) tablet 650 mg  650 mg Oral TID Lucilla Lame, MD   650 mg at 11/13/17 0931  . amitriptyline (ELAVIL) tablet 25 mg  25 mg Oral QHS Lucilla Lame, MD   25 mg at 11/12/17 2135  . amLODipine (NORVASC) tablet 10 mg  10 mg Oral Daily Lucilla Lame, MD   10 mg at 11/13/17 0930  . apixaban (ELIQUIS) tablet 10 mg  10 mg Oral BID Hallaji, Sheema M, RPH   10 mg at 11/13/17 0930   Followed by  . [START ON 11/18/2017] apixaban (ELIQUIS) tablet 5 mg  5 mg Oral BID Hallaji, Dani Gobble, RPH      . aspirin EC tablet 81 mg  81 mg Oral Daily Lucilla Lame, MD   81 mg at 11/13/17 0931  . cloNIDine (CATAPRES) tablet 0.2 mg  0.2 mg Oral BID Lucilla Lame, MD   0.2 mg at 11/13/17 0930  . docusate sodium (COLACE) capsule 100 mg  100 mg Oral BID Henreitta Leber, MD   100 mg at 11/13/17 0931  . famotidine (PEPCID) tablet 20 mg  20 mg Oral BID Henreitta Leber, MD      . gabapentin (  NEURONTIN) capsule 100 mg  100 mg Oral TID Lucilla Lame, MD   100 mg at 11/13/17 0931  . indomethacin (INDOCIN) 50 MG suppository 100 mg  100 mg Rectal Once Lucilla Lame, MD      . levothyroxine (SYNTHROID, LEVOTHROID) tablet 25 mcg  25 mcg Oral QAC breakfast Lucilla Lame, MD   25 mcg at 11/13/17 [phone]  . morphine 2 MG/ML injection 2 mg  2 mg Intravenous Q2H PRN Lucilla Lame, MD   2 mg at 11/07/17 0456  . nystatin (MYCOSTATIN) 100000 UNIT/ML suspension 500,000 Units  5 mL Oral QID Lucilla Lame, MD   500,000 Units at  11/13/17 0931  . OLANZapine (ZYPREXA) tablet 5 mg  5 mg Oral QHS Lucilla Lame, MD   5 mg at 11/12/17 2138  . ondansetron (ZOFRAN) injection 4 mg  4 mg Intravenous Once Lucilla Lame, MD   Stopped at 11/05/17 2233  . ondansetron (ZOFRAN) injection 4 mg  4 mg Intravenous Q6H Lucilla Lame, MD   4 mg at 11/12/17 1547  . pantoprazole (PROTONIX) EC tablet 40 mg  40 mg Oral BID Lucilla Lame, MD   40 mg at 11/13/17 0931  . polyethylene glycol (MIRALAX / GLYCOLAX) packet 17 g  17 g Oral Daily PRN Henreitta Leber, MD   17 g at 11/11/17 1520  . prochlorperazine (COMPAZINE) injection 5 mg  5 mg Intravenous Q4H PRN Lucilla Lame, MD      . simvastatin (ZOCOR) tablet 40 mg  40 mg Oral q1800 Lucilla Lame, MD   40 mg at 11/12/17 1855  . venlafaxine XR (EFFEXOR-XR) 24 hr capsule 150 mg  150 mg Oral QHS Lucilla Lame, MD   150 mg at 11/12/17 2138   Past Medical History:  Diagnosis Date  . CAD (coronary artery disease)   . Carpal tunnel syndrome   . GERD (gastroesophageal reflux disease)   . History of shingles   . Hormone receptor positive breast cancer ([place])   . Hyperlipemia   . Hypertension   . Stroke [name])    Past Surgical History:  Procedure Laterality Date  . ABDOMINAL HYSTERECTOMY    . APPENDECTOMY    . cornoary angioplasty    . ENDOSCOPIC RETROGRADE CHOLANGIOPANCREATOGRAPHY (ERCP) WITH PROPOFOL N/A 11/10/2017   Procedure: ENDOSCOPIC RETROGRADE CHOLANGIOPANCREATOGRAPHY (ERCP) WITH PROPOFOL;  Surgeon: Lucilla Lame, MD;  Location: [facility] ENDOSCOPY;  Service: Endoscopy;  Laterality: N/A;  . ESOPHAGOGASTRODUODENOSCOPY (EGD) WITH PROPOFOL N/A 11/06/2017   Procedure: ESOPHAGOGASTRODUODENOSCOPY (EGD) WITH PROPOFOL;  Surgeon: Lucilla Lame, MD;  Location: [facility] ENDOSCOPY;  Service: Endoscopy;  Laterality: N/A;   Social History Social History   Tobacco Use  . Smoking status: Former Research scientist (life sciences)  . Smokeless tobacco: Never Used  Substance Use Topics  . Alcohol use: Not Currently  . Drug use: Never   Family  History Family History  Problem Relation Age of Onset  . Hypertension Son   . Ovarian cancer Mother   The patient denies any family history of peripheral artery disease, venous disease or bleeding/clotting disorders.  Allergies  Allergen Reactions  . Clopidogrel Nausea And Vomiting     reported by [place] 11/24/13  . Omeprazole Other (See Comments)    Unknown reaction - reported by [place] 11/24/13   REVIEW OF SYSTEMS (Negative unless checked)  Constitutional: [] Weight loss  [] Fever  [] Chills Cardiac: [] Chest pain   [] Chest pressure   [] Palpitations   [] Shortness of breath when laying flat   [] Shortness of breath at rest   []   Shortness of breath with exertion. Vascular:  [] Pain in legs with walking   [] Pain in legs at rest   [x] Pain in legs when laying flat   [] Claudication   [] Pain in feet when walking  [] Pain in feet at rest  [] Pain in feet when laying flat   [x] History of DVT   [] Phlebitis   [x] Swelling in legs   [] Varicose veins   [] Non-healing ulcers Pulmonary:   [] Uses home oxygen   [] Productive cough   [] Hemoptysis   [] Wheeze  [] COPD   [] Asthma Neurologic:  [] Dizziness  [] Blackouts   [] Seizures   [] History of stroke   [] History of TIA  [] Aphasia   [] Temporary blindness   [] Dysphagia   [] Weakness or numbness in arms   [] Weakness or numbness in legs Musculoskeletal:  [] Arthritis   [] Joint swelling   [] Joint pain   [] Low back pain Hematologic:  [] Easy bruising  [] Easy bleeding   [] Hypercoagulable state   [] Anemic  [] Hepatitis Gastrointestinal:  [] Blood in stool   [] Vomiting blood  [] Gastroesophageal reflux/heartburn   [] Difficulty swallowing. Genitourinary:  [] Chronic kidney disease   [] Difficult urination  [] Frequent urination  [] Burning with urination   [] Blood in urine Skin:  [] Rashes   [] Ulcers   [] Wounds Psychological:  [] History of anxiety   []  History of major depression.  Physical Examination  Vitals:   11/12/17 1554 11/12/17 1951  11/13/17 0559 11/13/17 0930  BP: (!) 147/57 (!) 159/55 (!) 129/54 (!) 148/56  Pulse: 73 80 68 75  Resp:  18 20   Temp:  98.3 F (36.8 C)    TempSrc:  Oral    SpO2:  97% 96%   Weight:      Height:       Body mass index is 23.47 kg/m. Gen:  WD/WN, NAD Head: [place]/AT, No temporalis wasting. Prominent temp pulse not noted. Ear/Nose/Throat: Hearing grossly intact, nares w/o erythema or drainage, oropharynx w/o Erythema/Exudate Eyes: Sclera non-icteric, conjunctiva clear Neck: Trachea midline.  No JVD.  Pulmonary:  Good air movement, respirations not labored, equal bilaterally.  Cardiac: RRR, normal S1, S2. Vascular:  Vessel Right Left  Radial Palpable Palpable  Ulnar Palpable Palpable  Brachial Palpable Palpable  Carotid Palpable, without bruit Palpable, without bruit  Aorta Not palpable N/A  Femoral Palpable Palpable  Popliteal Palpable Palpable  PT Faint Non-Palp  DP Faint Non-Palp   Left Upper Extremity: Moderate edema. Soft. (+) radial pulse. No acute vascular compromise noted. Skin intact. No ulceration noted.  Left Lower Extremity: Mild to moderate edema. Extremity soft. Non-tender. No acute vascular compromise noted. Hard to palpate pedal pulses due to some edema however the foot is warm. Dopplerable DP. Some duskiness to the toes. Good capillary refill.   Gastrointestinal: soft, non-tender/non-distended. No guarding/reflex.  Musculoskeletal: Upper / Lower left side very weak.  Extremities without ischemic changes.  Neurologic: Speech is fluent. Motor exam as listed above. Psychiatric: Judgment intact, Mood & affect appropriate for pt's clinical situation. Dermatologic: No rashes or ulcers noted.  No cellulitis or open wounds. Lymph : No Cervical, Axillary, or Inguinal lymphadenopathy.  CBC Lab Results  Component Value Date   WBC 8.4 11/12/2017   HGB 11.4 (L) 11/12/2017   HCT 32.9 (L) 11/12/2017   MCV 84.9 11/12/2017   PLT 229 11/12/2017   BMET    Component Value  Date/Time   NA 142 11/11/2017 0544   K 3.6 11/11/2017 0544   CL 113 (H) 11/11/2017 0544   CO2 24 11/11/2017 0544   GLUCOSE 79 11/11/2017  0544   BUN 11 11/11/2017 0544   CREATININE 0.82 11/11/2017 0544   CALCIUM 7.9 (L) 11/11/2017 0544   GFRNONAA >60 11/11/2017 0544   GFRAA >60 11/11/2017 0544   Estimated Creatinine Clearance: 37.3 mL/min (by C-G formula based on SCr of 0.82 mg/dL).  COAG Lab Results  Component Value Date   INR 1.06 10/30/2017   INR 1.03 10/12/2017   Radiology Ct Abdomen Pelvis Wo Contrast  Result Date: 10/30/2017 CLINICAL DATA:  Nausea and vomiting. EXAM: CT ABDOMEN AND PELVIS WITHOUT CONTRAST TECHNIQUE: Multidetector CT imaging of the abdomen and pelvis was performed following the standard protocol without IV contrast. COMPARISON:  None. FINDINGS: Lower chest: Lung bases are essentially clear. There may be tiny vague nodular densities in right middle lobe which are likely incidental findings. Coronary artery calcifications. Descending thoracic aorta is heavily calcified and ectatic measuring up to 3.3 cm. Hepatobiliary: Intrahepatic and extrahepatic biliary dilatation. Evidence for an obstructing stone in the distal common bile duct region measuring roughly 1 cm. This is best seen on the coronal reformats, sequence 5, image 41. Common bile duct measures roughly 1.2 cm. Mild distention of the gallbladder. Intrahepatic biliary dilatation appears to be most prominent in the left hepatic lobe. Pancreas: No significant inflammation around the pancreas. Spleen: Low-density mildly exophytic structure involving the inferior aspect of the spleen measures 1.9 cm and nonspecific. Adrenals/Urinary Tract: Adrenal glands are within normal limits. Evidence for chronic atrophy in the left kidney with numerous left renal cysts and left hydroureteronephrosis. Left ureter is dilated down to the pelvis. The distal left ureter is not well visualized and may be atretic. High-density material  within the left renal collecting system is probably related to chronic stasis. Evidence for a complex cyst involving the right kidney lower pole that measures up to 5.2 cm. Mild dilatation of the right renal pelvis. Right kidney is slightly malrotated. No significant dilatation of the right ureter. Moderate distention of the urinary bladder. Stomach/Bowel: Fat stranding around the rectum is nonspecific. Moderate sized hiatal hernia. No evidence for bowel obstruction. No focal bowel inflammation. Vascular/Lymphatic: The abdominal aorta is heavily calcified. The origin of the SMA is heavily calcified. Evidence for stenosis involving the origin of the celiac trunk. Diffuse atherosclerotic disease in the common iliac arteries. No significant lymph node enlargement in the abdomen or pelvis. Reproductive: Status post hysterectomy. No adnexal masses. Other: Stranding in the perirectal region but no significant free fluid. Negative for free air. Musculoskeletal: Anterior wedge compression deformity involving the T12 vertebral body of unknown age. There is probably a compression deformity involving the inferior endplate of L1. Disc space narrowing and disease at L2-L3. IMPRESSION: 1. Biliary obstruction. Evidence for choledocholithiasis with a large stone in the distal common bile duct. There is intrahepatic and extrahepatic biliary dilatation. 2. Chronic obstruction in the distal left ureter of unknown etiology. There is severe atrophy in the left kidney with multiple left renal cysts. 3. Indeterminate 1.9 cm low-density structure in the spleen. Probably an incidental finding. 4. Large right renal cyst. 5. Severe atherosclerotic disease involving the aorta and visceral arteries. Suspect significant stenosis involving the SMA and possibly the celiac trunk. 6. Compression fractures at T12 and L1 of unknown age. Electronically Signed   By: Markus Daft M.D.   On: 10/30/2017 13:39   US Venous Img Lower Unilateral Left  Result  Date: 11/11/2017 CLINICAL DATA:  84 year old with left leg swelling. EXAM: LEFT LOWER EXTREMITY VENOUS DOPPLER ULTRASOUND TECHNIQUE: Gray-scale sonography with graded compression, as  well as color Doppler and duplex ultrasound were performed to evaluate the lower extremity deep venous systems from the level of the common femoral vein and including the common femoral, femoral, profunda femoral, popliteal and calf veins including the posterior tibial, peroneal and gastrocnemius veins when visible. The superficial great saphenous vein was also interrogated. Spectral Doppler was utilized to evaluate flow at rest and with distal augmentation maneuvers in the common femoral, femoral and popliteal veins. COMPARISON:  None. FINDINGS: Contralateral Common Femoral Vein: Respiratory phasicity is normal and symmetric with the symptomatic side. No evidence of thrombus. Normal compressibility. Common Femoral Vein: Positive for thrombus. Incomplete compressibility of the left common femoral vein with nonocclusive thrombus. Saphenofemoral Junction: No evidence of thrombus. Normal flow on color Doppler imaging. Profunda Femoral Vein: Positive for thrombus. Occlusive thrombus in the left profunda femoral vein. Femoral Vein: Positive for thrombus. Occlusive thrombus in the left femoral vein. Popliteal Vein: Positive for thrombus. Occlusive thrombus in the left popliteal vein. Calf Veins: Positive for thrombus in the gastrocnemius veins. Probable thrombus in 1 of the posterior tibial veins. Other Findings:  None. IMPRESSION: Positive for deep venous thrombosis in the left lower extremity. Thrombus involving the left common femoral vein, profunda femoral vein, femoral vein, popliteal vein and deep calf veins. Electronically Signed   By: Markus Daft M.D.   On: 11/11/2017 16:38   US Venous Img Upper Uni Left  Result Date: 11/12/2017 CLINICAL DATA:  84 year old female with left upper extremity swelling. Recent diagnosis of left lower  extremity DVT. EXAM: LEFT UPPER EXTREMITY VENOUS DOPPLER ULTRASOUND TECHNIQUE: Gray-scale sonography with graded compression, as well as color Doppler and duplex ultrasound were performed to evaluate the upper extremity deep venous system from the level of the subclavian vein and including the jugular, axillary, basilic, radial, ulnar and upper cephalic vein. Spectral Doppler was utilized to evaluate flow at rest and with distal augmentation maneuvers. COMPARISON:  None. FINDINGS: Contralateral Subclavian Vein: Respiratory phasicity is normal and symmetric with the symptomatic side. No evidence of thrombus. Normal compressibility. Internal Jugular Vein: No evidence of thrombus. Normal compressibility, respiratory phasicity and response to augmentation. Subclavian Vein: No evidence of thrombus. Normal compressibility, respiratory phasicity and response to augmentation. Axillary Vein: No evidence of thrombus. Normal compressibility, respiratory phasicity and response to augmentation. Cephalic Vein: No evidence of thrombus. Normal compressibility, respiratory phasicity and response to augmentation. Basilic Vein: Noncompressible beginning just above the antecubital fossa and extending into the mid upper arm. No evidence of color flow in this venous segment on color Doppler ultrasound. The more central basilic vein remains patent with normal color flow. Brachial Veins: No evidence of thrombus. Normal compressibility, respiratory phasicity and response to augmentation. Radial Veins: No evidence of thrombus. Normal compressibility, respiratory phasicity and response to augmentation. Ulnar Veins: No evidence of thrombus. Normal compressibility, respiratory phasicity and response to augmentation. Venous Reflux:  None visualized. Other Findings:  Extensive subcutaneous edema. IMPRESSION: Positive for superficial venous thrombosis involving a segment of the left basilic vein from just above the elbow into the mid upper arm.  Electronically Signed   By: Jacqulynn Cadet M.D.   On: 11/12/2017 13:26   Dg Chest Port 1 View  Result Date: 10/30/2017 CLINICAL DATA:  Difficulty swallowing and vomiting EXAM: PORTABLE CHEST 1 VIEW COMPARISON:  10/12/2017 FINDINGS: The heart size and mediastinal contours are within normal limits. Both lungs are clear. The visualized skeletal structures are unremarkable. IMPRESSION: No active disease. Electronically Signed   By: Linus Mako.D.  On: 10/30/2017 11:56   Dg C-arm 1-60 Min-no Report  Result Date: 11/10/2017 Fluoroscopy was utilized by the requesting physician.  No radiographic interpretation.   Assessment/Plan The patient is an 84 year old female with a past medical history of coronary artery disease, carpal tunnel syndrome, GERD, history of shingles, hormone receptor positive breast cancer, hyperlipidemia, hypertension, history of stroke who presented to the [facility] emergency department with a chief complaint of "excessive vomiting".  The patient was found to have a left lower extremity extensive DVT and left upper extremity thrombophlebitis - Stable 1.  Left lower extremity deep vein thrombosis: The patient does have an extensive left lower extremity DVT however the patient is status post a stroke with residual left upper/lower extremity paralysis.  There is no acute vascular compromise to the left lower extremity.  Agree with Eliquis for oral anticoagulation.  The patient understands that she will be on this for at least 6 months.  The patient is not a candidate for endovenous lysis.  We will see the patient in our office for her first outpatient venous duplex in approximately 2 weeks. Patient should elevate her extremity.  2.  Left upper extremity thrombophlebitis: Patient with superficial thrombophlebitis of left upper extremity.  The patient was encouraged to elevate her extremity and apply warm compresses to the area.  There is no indication for  endovascular intervention at this time.  Will also follow this as an outpatient. 3.  Hyperlipidemia: On ASA and statin. Encouraged good control as its slows the progression of atherosclerotic disease 4.  Hypertension: On appropriate medications. Encouraged good control as its slows the progression of atherosclerotic disease.  Discussed with Dr. Francene Castle, PA-C  11/13/2017 1:25 PM  This note was created with Dragon medical transcription system.  Any error is purely unintentional.

## 2017-11-13 NOTE — Progress Notes (Signed)
PHARMACIST - PHYSICIAN COMMUNICATION  DR:  Verdell Carmine  CONCERNING: IV to Oral Route Change Policy  RECOMMENDATION: This patient is receiving famotidine by the intravenous route.  Based on criteria approved by the Pharmacy and Therapeutics Committee, the intravenous medication(s) is/are being converted to the equivalent oral dose form(s).   DESCRIPTION: These criteria include:  The patient is eating (either orally or via tube) and/or has been taking other orally administered medications for a least 24 hours  The patient has no evidence of active gastrointestinal bleeding or impaired GI absorption (gastrectomy, short bowel, patient on TNA or NPO).  If you have questions about this conversion, please contact the Pharmacy Department  []   [phone] )  Sonya [name] [x]   [phone] )  [facility] []   [phone] )  [facility] []   [phone] )  [facility] []   [phone] )  [facility]   Vallery Sa, PharmD

## 2017-11-13 NOTE — Care Management Important Message (Signed)
Important Message  Patient Details  Name: Sonya [name] MRN: [id number] Date of Birth: [birth year]   Medicare Important Message Given:  Yes    Beverly Sessions, RN 11/13/2017, 12:21 PM

## 2017-11-13 NOTE — Plan of Care (Addendum)
L upper hand has cyanates and night hospitalized paged. L upper extremity ultra sound requested.  Urine Analysis ordered for malodorous urine with sedimentation.    Problem: Education: Goal: Knowledge of General Education information will improve Description Including pain rating scale, medication(s)/side effects and non-pharmacologic comfort measures Outcome: Progressing   Problem: Health Behavior/Discharge Planning: Goal: Ability to manage health-related needs will improve Outcome: Progressing   Problem: Clinical Measurements: Goal: Ability to maintain clinical measurements within normal limits will improve Outcome: Progressing Goal: Will remain free from infection Outcome: Progressing Goal: Diagnostic test results will improve Outcome: Progressing Goal: Respiratory complications will improve Outcome: Progressing Goal: Cardiovascular complication will be avoided Outcome: Progressing   Problem: Activity: Goal: Risk for activity intolerance will decrease Outcome: Progressing   Problem: Nutrition: Goal: Adequate nutrition will be maintained Outcome: Progressing   Problem: Coping: Goal: Level of anxiety will decrease Outcome: Progressing   Problem: Elimination: Goal: Will not experience complications related to bowel motility Outcome: Progressing Goal: Will not experience complications related to urinary retention Outcome: Progressing   Problem: Pain Managment: Goal: General experience of comfort will improve Outcome: Progressing   Problem: Safety: Goal: Ability to remain free from injury will improve Outcome: Progressing   Problem: Skin Integrity: Goal: Risk for impaired skin integrity will decrease Outcome: Progressing

## 2017-11-13 NOTE — Clinical Social Work Note (Signed)
CSW is awaiting re-auth by [facility]. The other re-auth expired as patient did not discharge on 9/23.  Shela Leff MSW,LCSW [phone]

## 2017-11-13 NOTE — Progress Notes (Signed)
[place] [facility] [place] NAME: Sonya [name]    MR#:  [id number]  DATE OF BIRTH:  April 30, 1932  SUBJECTIVE:   Patient denies any abdominal pain, nausea or vomiting.  Still has significant left lower extremity swelling which is somewhat improved.  Patient's toes on her left foot are little cool to touch and has poor capillary refill but she has pulses by Doppler on the left foot.  Denies any pain and has sensation on that foot.  REVIEW OF SYSTEMS:    Review of Systems  Constitutional: Negative for chills and fever.  HENT: Negative for congestion and tinnitus.   Eyes: Negative for blurred vision and double vision.  Respiratory: Negative for cough, shortness of breath and wheezing.   Cardiovascular: Negative for chest pain, orthopnea and PND.  Gastrointestinal: Negative for abdominal pain, diarrhea, nausea and vomiting.  Genitourinary: Negative for dysuria and hematuria.  Neurological: Positive for weakness (Generalized.). Negative for dizziness, sensory change and focal weakness.  All other systems reviewed and are negative.   Nutrition: Heart Healthy Tolerating Diet: yes Tolerating PT: Eval noted.   DRUG ALLERGIES:   Allergies  Allergen Reactions  . Clopidogrel Nausea And Vomiting     reported by [place] 11/24/13  . Omeprazole Other (See Comments)    Unknown reaction - reported by [place] 11/24/13    VITALS:  Blood pressure (!) 133/59, pulse 77, temperature 98.3 F (36.8 C), temperature source Oral, resp. rate 19, height 5' (1.524 m), weight 54.5 kg, SpO2 97 %.  PHYSICAL EXAMINATION:   Physical Exam  GENERAL:  84 y.o.-year-old patient lying in bed in NAD EYES: Pupils equal, round, reactive to light and accommodation. No scleral icterus. Extraocular muscles intact.  HEENT: Head atraumatic, normocephalic. Oropharynx and nasopharynx clear.  NECK:  Supple, no jugular venous distention. No  thyroid enlargement, no tenderness.  LUNGS: Normal breath sounds bilaterally, no wheezing, rales, rhonchi. No use of accessory muscles of respiration.  CARDIOVASCULAR: S1, S2 normal. No murmurs, rubs, or gallops.  ABDOMEN: Soft, nontender, nondistended. Bowel sounds present. No organomegaly or mass.  EXTREMITIES: No cyanosis, clubbing, LLE swelling > right. Left leg warm to touch.  Left upper ext. Swelling > right.   NEUROLOGIC: Cranial nerves II through XII are intact. Left sided hemiparesis due to previous CVA.   PSYCHIATRIC: The patient is alert and oriented x 3.  SKIN: No obvious rash, lesion, or ulcer.    LABORATORY PANEL:   CBC Recent Labs  Lab 11/12/17 0602  WBC 8.4  HGB 11.4*  HCT 32.9*  PLT 229   ------------------------------------------------------------------------------------------------------------------  Chemistries  Recent Labs  Lab 11/11/17 0544  NA 142  K 3.6  CL 113*  CO2 24  GLUCOSE 79  BUN 11  CREATININE 0.82  CALCIUM 7.9*  AST 23  ALT 10  ALKPHOS 62  BILITOT 0.5   ------------------------------------------------------------------------------------------------------------------  Cardiac Enzymes No results for input(s): TROPONINI in the last 168 hours. ------------------------------------------------------------------------------------------------------------------  RADIOLOGY:  US Venous Img Lower Unilateral Left  Result Date: 11/11/2017 CLINICAL DATA:  84 year old with left leg swelling. EXAM: LEFT LOWER EXTREMITY VENOUS DOPPLER ULTRASOUND TECHNIQUE: Gray-scale sonography with graded compression, as well as color Doppler and duplex ultrasound were performed to evaluate the lower extremity deep venous systems from the level of the common femoral vein and including the common femoral, femoral, profunda femoral, popliteal and calf veins including the posterior tibial, peroneal and gastrocnemius veins when visible. The superficial great saphenous vein  was also interrogated. Spectral Doppler was utilized to evaluate flow at rest and with distal augmentation maneuvers in the common femoral, femoral and popliteal veins. COMPARISON:  None. FINDINGS: Contralateral Common Femoral Vein: Respiratory phasicity is normal and symmetric with the symptomatic side. No evidence of thrombus. Normal compressibility. Common Femoral Vein: Positive for thrombus. Incomplete compressibility of the left common femoral vein with nonocclusive thrombus. Saphenofemoral Junction: No evidence of thrombus. Normal flow on color Doppler imaging. Profunda Femoral Vein: Positive for thrombus. Occlusive thrombus in the left profunda femoral vein. Femoral Vein: Positive for thrombus. Occlusive thrombus in the left femoral vein. Popliteal Vein: Positive for thrombus. Occlusive thrombus in the left popliteal vein. Calf Veins: Positive for thrombus in the gastrocnemius veins. Probable thrombus in 1 of the posterior tibial veins. Other Findings:  None. IMPRESSION: Positive for deep venous thrombosis in the left lower extremity. Thrombus involving the left common femoral vein, profunda femoral vein, femoral vein, popliteal vein and deep calf veins. Electronically Signed   By: Markus Daft M.D.   On: 11/11/2017 16:38   US Venous Img Upper Uni Left  Result Date: 11/12/2017 CLINICAL DATA:  84 year old female with left upper extremity swelling. Recent diagnosis of left lower extremity DVT. EXAM: LEFT UPPER EXTREMITY VENOUS DOPPLER ULTRASOUND TECHNIQUE: Gray-scale sonography with graded compression, as well as color Doppler and duplex ultrasound were performed to evaluate the upper extremity deep venous system from the level of the subclavian vein and including the jugular, axillary, basilic, radial, ulnar and upper cephalic vein. Spectral Doppler was utilized to evaluate flow at rest and with distal augmentation maneuvers. COMPARISON:  None. FINDINGS: Contralateral Subclavian Vein: Respiratory phasicity  is normal and symmetric with the symptomatic side. No evidence of thrombus. Normal compressibility. Internal Jugular Vein: No evidence of thrombus. Normal compressibility, respiratory phasicity and response to augmentation. Subclavian Vein: No evidence of thrombus. Normal compressibility, respiratory phasicity and response to augmentation. Axillary Vein: No evidence of thrombus. Normal compressibility, respiratory phasicity and response to augmentation. Cephalic Vein: No evidence of thrombus. Normal compressibility, respiratory phasicity and response to augmentation. Basilic Vein: Noncompressible beginning just above the antecubital fossa and extending into the mid upper arm. No evidence of color flow in this venous segment on color Doppler ultrasound. The more central basilic vein remains patent with normal color flow. Brachial Veins: No evidence of thrombus. Normal compressibility, respiratory phasicity and response to augmentation. Radial Veins: No evidence of thrombus. Normal compressibility, respiratory phasicity and response to augmentation. Ulnar Veins: No evidence of thrombus. Normal compressibility, respiratory phasicity and response to augmentation. Venous Reflux:  None visualized. Other Findings:  Extensive subcutaneous edema. IMPRESSION: Positive for superficial venous thrombosis involving a segment of the left basilic vein from just above the elbow into the mid upper arm. Electronically Signed   By: Jacqulynn Cadet M.D.   On: 11/12/2017 13:26     ASSESSMENT AND PLAN:   84 year old female with past medical history of breast cancer, stroke, hypertension, hyperlipidemia and GERD, history of coronary artery disease who presented to the hospital due to abdominal pain and nausea.  1.  Abdominal pain/nausea- due to constipation combined with gastritis/esophageal stricture.  Patient was seen by gastroenterology and is s/p upper GI endoscopy and underwent dilatation of the GE junction stenosis. -  nausea improved and pt. Is tolerating PO well now.  2.  Acute kidney injury-secondary to dehydration from ongoing nausea and abdominal pain. -Resolved and creatinine at baseline now.  Renal dose meds, avoid nephrotoxins.  3. Choledocholithiasis- patient's LFTs and  lipase and alkaline phosphatase are stable.  Seen by GI and status post ERCP on 11/10/17 with sphincterotomy and extraction of stones.  Patient did have a filling defect based on cholangiogram.   -GI recommended cholecystectomy and surgical consult was placed and seen by surgery and no plans for acute surgical intervention given her recent stroke and multiple comorbidities.  Plan for cholecystectomy possible as outpatient.   4.  DVT-patient noted to have significant swelling of her left lower extremity.  Dopplers of her lower extremities are positive for an extensive DVT.  Patient also noted to have a superficial thrombus on the left upper extremity which is likely secondary to IV. -Continue Eliquis.  Patient's left foot and toes are somewhat cool to touch but not cyanotic appearing.  Discussed with vascular surgery and will have Dr. Delana Meyer see the patient.  5. Constipation - resolved. Cont. Miralax.    6.  Hypothyroidism-continue Synthroid.  7.  Neuropathy-continue gabapentin.  8.  Hyperlipidemia-continue simvastatin.  9. Depression - cont. Effexor.    Discussed with social work and patient will need reauthorization with insurance prior to discharge to short-term rehab.  All the records are reviewed and case discussed with Care Management/Social Worker. Management plans discussed with the patient, family and they are in agreement.  CODE STATUS: DNR  DVT Prophylaxis: Hep SQ  TOTAL TIME TAKING CARE OF THIS PATIENT: 30 minutes.   POSSIBLE D/C IN 1-2 DAYS, DEPENDING ON CLINICAL CONDITION.   Henreitta Leber M.D on 11/13/2017 at 2:09 PM  Between 7am to 6pm - Pager - [phone]  After 6pm go to [email or web address] - Microbiologist  [facility]  Office  [phone]  CC: Primary care physician; Baxter Hire, MD

## 2017-11-13 NOTE — Progress Notes (Signed)
PT Cancellation Note  Patient Details Name: Sonya [name] MRN: [id number] DOB: Nov 22, 1932   Cancelled Treatment:    PT visit attempted, patient sleeping and unable to arouse to participate in therapy. Will check back at a later time/day.   Lunah Losasso, PT, GCS 11/13/17,1:43 PM

## 2017-11-14 LAB — URINALYSIS, COMPLETE (UACMP) WITH MICROSCOPIC
BILIRUBIN URINE: NEGATIVE
Glucose, UA: NEGATIVE mg/dL
Ketones, ur: NEGATIVE mg/dL
Nitrite: NEGATIVE
PROTEIN: NEGATIVE mg/dL
Specific Gravity, Urine: 1.015 (ref 1.005–1.030)
pH: 5 (ref 5.0–8.0)

## 2017-11-14 MED ORDER — OXYCODONE HCL 5 MG PO TABS: 5.0000 mg | ORAL_TABLET | Freq: Four times a day (QID) | ORAL | Status: DC | PRN

## 2017-11-14 MED ORDER — POLYETHYLENE GLYCOL 3350 17 G PO PACK
17.0000 g | PACK | Freq: Every day | ORAL | Status: DC
  Administered 2017-11-14 – 2017-11-15 (×2): 17 g via ORAL
  Filled 2017-11-14 (×2): qty 1

## 2017-11-14 MED ORDER — ONDANSETRON HCL 4 MG/2ML IJ SOLN: 4.0000 mg | Freq: Four times a day (QID) | INTRAMUSCULAR | Status: DC | PRN

## 2017-11-14 NOTE — Progress Notes (Signed)
Patient ID: Sonya [name], female   DOB: April 30, 1932, 84 y.o.   MRN: [id number]  [facility] PROGRESS NOTE  Zoelle Markus HCW:[id number] DOB: Feb 27, 1932 DOA: 11/05/2017 PCP: Baxter Hire, MD  HPI/Subjective: Patient feeling better.  Still feeling weak.  Positive for constipation.  Awaiting authorization for rehab.  Objective: Vitals:   11/14/17 0524 11/14/17 1309  BP: (!) 116/49 (!) 108/53  Pulse: 65 60  Resp: 16 17  Temp: 98.1 F (36.7 C) 98 F (36.7 C)  SpO2: 91% 93%    Filed Weights   11/05/17 2214 11/06/17 0035  Weight: 55 kg 54.5 kg    ROS: Review of Systems  Constitutional: Negative for chills and fever.  Eyes: Negative for blurred vision.  Respiratory: Negative for cough and shortness of breath.   Cardiovascular: Negative for chest pain.  Gastrointestinal: Negative for abdominal pain, constipation, diarrhea, nausea and vomiting.  Genitourinary: Negative for dysuria.  Musculoskeletal: Negative for joint pain.  Neurological: Negative for dizziness and headaches.   Exam: Physical Exam  Constitutional: She is oriented to person, place, and time.  HENT:  Nose: No mucosal edema.  Mouth/Throat: No oropharyngeal exudate or posterior oropharyngeal edema.  Eyes: Pupils are equal, round, and reactive to light. Conjunctivae, EOM and lids are normal.  Neck: No JVD present. Carotid bruit is not present. No edema present. No thyroid mass and no thyromegaly present.  Cardiovascular: S1 normal and S2 normal. Exam reveals no gallop.  No murmur heard. Pulses:      Dorsalis pedis pulses are 2+ on the right side, and 2+ on the left side.  Respiratory: No respiratory distress. She has no wheezes. She has no rhonchi. She has no rales.  GI: Soft. Bowel sounds are normal. There is no tenderness.  Musculoskeletal:       Left elbow: She exhibits swelling.       Left wrist: She exhibits tenderness.       Right ankle: She exhibits swelling.       Left ankle: She exhibits  swelling.  Lymphadenopathy:    She has no cervical adenopathy.  Neurological: She is alert and oriented to person, place, and time. No cranial nerve deficit.  Skin: Skin is warm. No rash noted. Nails show no clubbing.  Toes on the left foot looking a little purpleish  Psychiatric: She has a normal mood and affect.      Data Reviewed: Basic Metabolic Panel: Recent Labs  Lab 11/08/17 0620 11/09/17 0631 11/11/17 0544  NA 142 142 142  K 3.6 3.6 3.6  CL 110 111 113*  CO2 22 26 24   GLUCOSE 104* 86 79  BUN 15 11 11   CREATININE 0.93 0.94 0.82  CALCIUM 8.9 8.7* 7.9*   Liver Function Tests: Recent Labs  Lab 11/11/17 0544  AST 23  ALT 10  ALKPHOS 62  BILITOT 0.5  PROT 4.4*  ALBUMIN 2.0*   Recent Labs  Lab 11/11/17 0544  LIPASE 25   CBC: Recent Labs  Lab 11/08/17 0620 11/11/17 0544 11/12/17 0602  WBC 11.1* 9.9 8.4  HGB 12.7 11.1* 11.4*  HCT 38.3 32.5* 32.9*  MCV 86.0 84.6 84.9  PLT 207 212 229    CBG: Recent Labs  Lab 11/13/17 0322 11/13/17 0610  GLUCAP 79 83   Scheduled Meds: . acetaminophen  650 mg Oral TID  . amitriptyline  25 mg Oral QHS  . amLODipine  10 mg Oral Daily  . apixaban  10 mg Oral BID   Followed by  . [  START ON 11/18/2017] apixaban  5 mg Oral BID  . aspirin EC  81 mg Oral Daily  . cloNIDine  0.2 mg Oral BID  . docusate sodium  100 mg Oral BID  . famotidine  20 mg Oral BID  . gabapentin  100 mg Oral TID  . indomethacin  100 mg Rectal Once  . levothyroxine  25 mcg Oral QAC breakfast  . nystatin  5 mL Oral QID  . OLANZapine  5 mg Oral QHS  . ondansetron (ZOFRAN) IV  4 mg Intravenous Once  . ondansetron (ZOFRAN) IV  4 mg Intravenous Q6H  . pantoprazole  40 mg Oral BID  . polyethylene glycol  17 g Oral Daily  . simvastatin  40 mg Oral q1800  . venlafaxine XR  150 mg Oral QHS   Continuous Infusions:  Assessment/Plan:  1. Abdominal pain and nausea.  Constipation, gastritis and esophageal stricture.  Patient had upper endoscopy and  dilation of GE junction and stenosis.  Nausea has improved and patient is tolerating oral diet. 2. Choledocholithiasis.  Patient's LFTs lipase and alkaline phosphatase are stable.  Patient underwent an ERCP on 11/10/2017 with sphincterotomy and extraction of stones.  GI recommends cholecystectomy but surgery has no plans for any surgical intervention.  Patient high risk for choledocholithiasis to happen again 3. Acute kidney injury.  Creatinine normalized with IV fluids 4. DVT left lower extremity and superficial DVT left upper extremity on Eliquis 5. Hypertension on clonidine and Norvasc 6. Hypothyroidism unspecified on levothyroxine  Code Status:     Code Status Orders  (From admission, onward)         Start     Ordered   11/06/17 0040  Do not attempt resuscitation (DNR)  Continuous    Question Answer Comment  In the event of cardiac or respiratory ARREST Do not call a "code blue"   In the event of cardiac or respiratory ARREST Do not perform Intubation, CPR, defibrillation or ACLS   In the event of cardiac or respiratory ARREST Use medication by any route, position, wound care, and other measures to relive pain and suffering. May use oxygen, suction and manual treatment of airway obstruction as needed for comfort.      11/06/17 0039        Code Status History    Date Active Date Inactive Code Status Order ID Comments User Context   10/30/2017 1648 11/03/2017 1703 DNR [id number]  Dustin Flock, MD Inpatient   10/12/2017 2234 10/15/2017 2130 Full Code [id number]  Vianne Bulls, MD ED     Family Communication: Husband and son at the bedside Disposition Plan: To rehab once insurance authorization obtained  Consultants:  Gastroenterology  General surgery  Time spent: 27 minutes  [place]

## 2017-11-14 NOTE — Progress Notes (Signed)
Physical Therapy Treatment Patient Details Name: Sonya [name] MRN: [id number] DOB: November 28, 1932 Today's Date: 11/14/2017    History of Present Illness pt is a 84 y.o F admitted on 11/05/2017 with dx of acute kidney injury and CC of nausea and vomiting. She was previously discharged 48 hours ago with dx of cholelithiasis and chronic vomitting. She has a hx stroke, HTN, and currently reports chronic low back pain.     PT Comments    Chart reviewed.  New UE DVT noted.  Per vascular note no further interventions.  Pt remains on Eliquis.  Discussed with primary PT on site.  Bethena Midget.  OK to continue with current goals.  Participated in exercises as described below.  Pt with max a for bed mobility skills.  She was able to increase her sitting to 10 minutes today at edge of bed.  She requires mod a for short periods as pushes L and post in sitting but is able to pull on bed rail for short periods to assist.  Mostly, requires max a x 1 to remain sitting. Back pain continues but eases with time in sitting. She is motivated to work hard during therapy sessions depite weakness, back pain and L UE/LE limitations from CVA.  She remains lift appropriate for transfers.   Follow Up Recommendations  SNF     Equipment Recommendations       Recommendations for Other Services       Precautions / Restrictions Precautions Precautions: Fall Restrictions Weight Bearing Restrictions: No Other Position/Activity Restrictions: contact precautions    Mobility  Bed Mobility Overal bed mobility: Needs Assistance Bed Mobility: Rolling;Supine to Sit;Sit to Supine Rolling: Max assist Sidelying to sit: Max assist Supine to sit: Max assist Sit to supine: Max assist      Transfers                 General transfer comment: Hoyer/lift appropriate due to poor sitting balance and pushing.  Son stated lift was used at rehab for transfers.  Ambulation/Gait             General Gait Details:  unsafe/unable at this time   Stairs             Wheelchair Mobility    Modified Rankin (Stroke Patients Only)       Balance Overall balance assessment: Needs assistance   Sitting balance-Leahy Scale: Poor Sitting balance - Comments: pushing behaviors towards L, post                                    Cognition Arousal/Alertness: Awake/alert Behavior During Therapy: WFL for tasks assessed/performed Overall Cognitive Status: Within Functional Limits for tasks assessed                                        Exercises Other Exercises Other Exercises: BLE exersices in supine AAROM - ankle pumps, heel slides, ab/add and SLR.  Pt with limited AAROM LLE.  x 10 reps Other Exercises: Sitting x 10 minutes edge of bed with max assist.    General Comments        Pertinent Vitals/Pain Pain Assessment: Faces Faces Pain Scale: Hurts whole lot Pain Location: chronic back pain Pain Descriptors / Indicators: Aching;Sore Pain Intervention(s): Limited activity within patient's tolerance;Monitored during session    Home Living  Prior Function            PT Goals (current goals can now be found in the care plan section) Progress towards PT goals: Progressing toward goals    Frequency    Min 2X/week      PT Plan Current plan remains appropriate    Co-evaluation              AM-PAC PT "6 Clicks" Daily Activity  Outcome Measure  Difficulty turning over in bed (including adjusting bedclothes, sheets and blankets)?: Unable Difficulty moving from lying on back to sitting on the side of the bed? : Unable Difficulty sitting down on and standing up from a chair with arms (e.g., wheelchair, bedside commode, etc,.)?: Unable Help needed moving to and from a bed to chair (including a wheelchair)?: Total Help needed walking in hospital room?: Total Help needed climbing 3-5 steps with a railing? : Total 6 Click  Score: 6    End of Session Equipment Utilized During Treatment: Gait belt Activity Tolerance: Patient tolerated treatment well Patient left: in bed;with bed alarm set;with call bell/phone within reach;with family/visitor present   Hemiplegia - Right/Left: Left Hemiplegia - dominant/non-dominant: Non-dominant Hemiplegia - caused by: Cerebral infarction     Time: 3151-7616 PT Time Calculation (min) (ACUTE ONLY): 23 min  Charges:  $Therapeutic Exercise: 8-22 mins $Therapeutic Activity: 8-22 mins                    Chesley Noon, PTA 11/14/17, 10:45 AM

## 2017-11-15 NOTE — Progress Notes (Signed)
Patient ID: Sonya [name], female   DOB: [birth year], 84 y.o.   MRN: [id number]  [facility] PROGRESS NOTE  Sonya [name] GUR:[id number] DOB: 1932-07-05 DOA: 11/05/2017 PCP: Baxter Hire, MD  HPI/Subjective: Patient states she is tolerating diet.  Feeling a little bit better.  No abdominal pain.  Objective: Vitals:   11/15/17 0622 11/15/17 1013  BP: (!) 113/49 (!) 120/46  Pulse: 62   Resp: 16   Temp: 97.8 F (36.6 C)   SpO2: 100%     Filed Weights   11/05/17 2214 11/06/17 0035  Weight: 55 kg 54.5 kg    ROS: Review of Systems  Constitutional: Negative for chills and fever.  Eyes: Negative for blurred vision.  Respiratory: Negative for cough and shortness of breath.   Cardiovascular: Negative for chest pain.  Gastrointestinal: Negative for abdominal pain, constipation, diarrhea, nausea and vomiting.  Genitourinary: Negative for dysuria.  Musculoskeletal: Negative for joint pain.  Neurological: Negative for dizziness and headaches.   Exam: Physical Exam  Constitutional: She is oriented to person, place, and time.  HENT:  Nose: No mucosal edema.  Mouth/Throat: No oropharyngeal exudate or posterior oropharyngeal edema.  Eyes: Pupils are equal, round, and reactive to light. Conjunctivae, EOM and lids are normal.  Neck: No JVD present. Carotid bruit is not present. No edema present. No thyroid mass and no thyromegaly present.  Cardiovascular: S1 normal and S2 normal. Exam reveals no gallop.  No murmur heard. Pulses:      Dorsalis pedis pulses are 2+ on the right side, and 2+ on the left side.  Respiratory: No respiratory distress. She has no wheezes. She has no rhonchi. She has no rales.  GI: Soft. Bowel sounds are normal. There is no tenderness.  Musculoskeletal:       Left elbow: She exhibits swelling.       Left wrist: She exhibits tenderness.       Right ankle: She exhibits swelling.       Left ankle: She exhibits swelling.  Lymphadenopathy:    She has no  cervical adenopathy.  Neurological: She is alert and oriented to person, place, and time. No cranial nerve deficit.  Skin: Skin is warm. No rash noted. Nails show no clubbing.  Toes on the left foot looking a little purpleish  Psychiatric: She has a normal mood and affect.      Data Reviewed: Basic Metabolic Panel: Recent Labs  Lab 11/09/17 0631 11/11/17 0544  NA 142 142  K 3.6 3.6  CL 111 113*  CO2 26 24  GLUCOSE 86 79  BUN 11 11  CREATININE 0.94 0.82  CALCIUM 8.7* 7.9*   Liver Function Tests: Recent Labs  Lab 11/11/17 0544  AST 23  ALT 10  ALKPHOS 62  BILITOT 0.5  PROT 4.4*  ALBUMIN 2.0*   Recent Labs  Lab 11/11/17 0544  LIPASE 25   CBC: Recent Labs  Lab 11/11/17 0544 11/12/17 0602  WBC 9.9 8.4  HGB 11.1* 11.4*  HCT 32.5* 32.9*  MCV 84.6 84.9  PLT 212 229    CBG: Recent Labs  Lab 11/13/17 0322 11/13/17 0610  GLUCAP 79 83   Scheduled Meds: . acetaminophen  650 mg Oral TID  . amitriptyline  25 mg Oral QHS  . amLODipine  10 mg Oral Daily  . apixaban  10 mg Oral BID   Followed by  . [START ON 11/18/2017] apixaban  5 mg Oral BID  . aspirin EC  81 mg Oral Daily  .  cloNIDine  0.2 mg Oral BID  . docusate sodium  100 mg Oral BID  . famotidine  20 mg Oral BID  . gabapentin  100 mg Oral TID  . levothyroxine  25 mcg Oral QAC breakfast  . nystatin  5 mL Oral QID  . OLANZapine  5 mg Oral QHS  . pantoprazole  40 mg Oral BID  . polyethylene glycol  17 g Oral Daily  . simvastatin  40 mg Oral q1800  . venlafaxine XR  150 mg Oral QHS   Continuous Infusions:  Assessment/Plan:  1. Abdominal pain and nausea.  Constipation, gastritis and esophageal stricture.  Patient had upper endoscopy and dilation of GE junction for stenosis.  Nausea has improved and patient is tolerating oral diet. 2. Choledocholithiasis.  Patient's LFTs lipase and alkaline phosphatase are stable.  Patient underwent an ERCP on 11/10/2017 with sphincterotomy and extraction of stones.   GI recommends cholecystectomy but surgery has no plans for any surgical intervention.  Patient high risk for choledocholithiasis to happen again 3. Acute kidney injury.  Creatinine normalized with IV fluids 4. DVT left lower extremity and superficial DVT left upper extremity on Eliquis 5. Hypertension on clonidine and Norvasc 6. Hypothyroidism unspecified on levothyroxine 7. Weakness.  Physical therapy recommends rehab.  Awaiting insurance authorization  Code Status:     Code Status Orders  (From admission, onward)         Start     Ordered   11/06/17 0040  Do not attempt resuscitation (DNR)  Continuous    Question Answer Comment  In the event of cardiac or respiratory ARREST Do not call a "code blue"   In the event of cardiac or respiratory ARREST Do not perform Intubation, CPR, defibrillation or ACLS   In the event of cardiac or respiratory ARREST Use medication by any route, position, wound care, and other measures to relive pain and suffering. May use oxygen, suction and manual treatment of airway obstruction as needed for comfort.      11/06/17 0039        Code Status History    Date Active Date Inactive Code Status Order ID Comments User Context   10/30/2017 1648 11/03/2017 1703 DNR [id number]  Dustin Flock, MD Inpatient   10/12/2017 2234 10/15/2017 2130 Full Code [id number]  Vianne Bulls, MD ED     Family Communication: Husband and son at the bedside Disposition Plan: To rehab once insurance authorization obtained  Consultants:  Gastroenterology  General surgery  Time spent: 26 minutes  [place]

## 2017-11-16 LAB — CBC
HCT: 34 % — ABNORMAL LOW (ref 35.0–47.0)
Hemoglobin: 11.4 g/dL — ABNORMAL LOW (ref 12.0–16.0)
MCH: 28.8 pg (ref 26.0–34.0)
MCHC: 33.6 g/dL (ref 32.0–36.0)
MCV: 85.9 fL (ref 80.0–100.0)
PLATELETS: 276 10*3/uL (ref 150–440)
RBC: 3.96 MIL/uL (ref 3.80–5.20)
RDW: 14.7 % — AB (ref 11.5–14.5)
WBC: 7.9 10*3/uL (ref 3.6–11.0)

## 2017-11-16 MED ORDER — POLYETHYLENE GLYCOL 3350 17 G PO PACK: 17.0000 g | PACK | Freq: Every day | ORAL | 0 refills | Status: DC | PRN

## 2017-11-16 MED ORDER — LACTULOSE 10 GM/15ML PO SOLN
30.0000 g | Freq: Once | ORAL | Status: AC
  Administered 2017-11-16: 30 g via ORAL
  Filled 2017-11-16: qty 60

## 2017-11-16 MED ORDER — BISACODYL 10 MG RE SUPP
10.0000 mg | Freq: Once | RECTAL | Status: AC
  Administered 2017-11-16: 10 mg via RECTAL
  Filled 2017-11-16: qty 1

## 2017-11-16 MED ORDER — FAMOTIDINE 20 MG PO TABS: 20.0000 mg | ORAL_TABLET | Freq: Two times a day (BID) | ORAL | 0 refills | Status: DC

## 2017-11-16 MED ORDER — GABAPENTIN 100 MG PO CAPS: 100.0000 mg | ORAL_CAPSULE | Freq: Three times a day (TID) | ORAL | 0 refills | Status: DC

## 2017-11-16 MED ORDER — APIXABAN 5 MG PO TABS: ORAL_TABLET | ORAL | 0 refills | Status: DC

## 2017-11-16 MED ORDER — OXYCODONE HCL 5 MG PO TABS: 5.0000 mg | ORAL_TABLET | Freq: Four times a day (QID) | ORAL | 0 refills | Status: DC | PRN

## 2017-11-16 NOTE — Clinical Social Work Note (Signed)
CSW has contacted patient's insurance company to inquire on re-auth. It is still pending. Patient is also in need of having a bowel movement prior to discharge. Shela Leff MSW,LCSW [phone]

## 2017-11-16 NOTE — Care Management Important Message (Signed)
Important Message  Patient Details  Name: Sonya [name] MRN: [id number] Date of Birth: [birth year]   Medicare Important Message Given:  Yes    Beverly Sessions, RN 11/16/2017, 1:46 PM

## 2017-11-16 NOTE — Discharge Summary (Signed)
[place] [facility] [place] NAME: Sonya [name]    MR#:  [id number]  DATE OF BIRTH:  [birth year]  DATE OF ADMISSION:  11/05/2017 ADMITTING PHYSICIAN: Sela Hua, MD  DATE OF DISCHARGE: 11/16/2017  PRIMARY CARE PHYSICIAN: Baxter Hire, MD    ADMISSION DIAGNOSIS:  AKI (acute kidney injury) ([place]) [N17.9] Non-intractable vomiting with nausea, unspecified vomiting type [R11.2]  DISCHARGE DIAGNOSIS:  Principal Problem:   AKI (acute kidney injury) ([place]) Active Problems:   Hypertension   Hypothyroidism   Choledocholithiasis   Nausea & vomiting   Problems with swallowing and mastication   Acute gastritis without hemorrhage   Stricture and stenosis of esophagus   Calculus of common duct without obstruction   Abnormal findings on diagnostic imaging of liver   SECONDARY DIAGNOSIS:   Past Medical History:  Diagnosis Date  . CAD (coronary artery disease)   . Carpal tunnel syndrome   . GERD (gastroesophageal reflux disease)   . History of shingles   . Hormone receptor positive breast cancer ([place])   . Hyperlipemia   . Hypertension   . Stroke [facility])     HOSPITAL COURSE:   1.  Abdominal pain, nausea and constipation.  EGD procedure performed on 920 showed hiatal hernia, gastric erythema and esophageal stricture which was dilated.  Patient is on PPI and Pepcid.  Patient has tolerated diet.  Patient does have constipation.  Dose of MiraLAX and Dulcolax suppository ordered today.  MiraLAX as needed as outpatient. 2.  Choledocholithiasis.  Patient had an ERCP on 11/10/2017 with sphincterotomy.  Please see operative report.  GI recommended cholecystectomy but surgery had no plans for any current intervention.  Patient is high risk for choledocholithiasis to happen again.  Follow-up with GI as outpatient. 3.  Acute kidney injury.  Creatinine improved and normalized with IV fluid hydration 4.  DVT left lower extremity and superficial DVT left upper  extremity on Eliquis.  Recommend repeat checking sonogram as outpatient prior to discontinuing Eliquis. 5.  Hypertension on clonidine and Norvasc.  Can considering tapering clonidine as outpatient 6.  Hypothyroidism unspecified on levothyroxine 7.  Weakness.  Physical therapy recommends rehab.  Once insurance authorization obtained can go out to rehab today  DISCHARGE CONDITIONS:   Satisfactory  CONSULTS OBTAINED:  Treatment Team:  Benjamine Sprague, DO Schnier, Dolores Lory, MD  DRUG ALLERGIES:   Allergies  Allergen Reactions  . Clopidogrel Nausea And Vomiting     reported by [place] 11/24/13  . Omeprazole Other (See Comments)    Unknown reaction - reported by [place] 11/24/13    DISCHARGE MEDICATIONS:   Allergies as of 11/16/2017      Reactions   Clopidogrel Nausea And Vomiting    reported by [place] 11/24/13   Omeprazole Other (See Comments)   Unknown reaction - reported by [place] 11/24/13      Medication List    STOP taking these medications   benazepril 20 MG tablet Commonly known as:  LOTENSIN   metoprolol tartrate 25 MG tablet Commonly known as:  LOPRESSOR     TAKE these medications   acetaminophen 325 MG tablet Commonly known as:  TYLENOL Take 2 tablets (650 mg total) by mouth every 4 (four) hours as needed for mild pain (or temp > 37.5 C (99.5 F)).   amLODipine 10 MG tablet Commonly known as:  NORVASC Take 1 tablet (10 mg total) by mouth daily.  apixaban 5 MG Tabs tablet Commonly known as:  ELIQUIS Two tablets twice a day through 11/17/2017 then one tablet twice a day starting 11/18/2017   aspirin 81 MG EC tablet Take 1 tablet (81 mg total) by mouth daily.   b complex vitamins tablet Take 1 tablet by mouth daily.   cloNIDine 0.2 MG tablet Commonly known as:  CATAPRES Take 1 tablet (0.2 mg total) by mouth 2 (two) times daily.   DUCODYL 5 MG EC tablet Generic drug:   bisacodyl Take 10 mg by mouth daily.   famotidine 20 MG tablet Commonly known as:  PEPCID Take 1 tablet (20 mg total) by mouth 2 (two) times daily.   ibuprofen 400 MG tablet Commonly known as:  ADVIL,MOTRIN Take 1 tablet (400 mg total) by mouth every 8 (eight) hours as needed for moderate pain.   lansoprazole 15 MG capsule Commonly known as:  PREVACID Take 15 mg by mouth at bedtime.   levothyroxine 25 MCG tablet Commonly known as:  SYNTHROID, LEVOTHROID Take 25 mcg by mouth daily before breakfast.   magic mouthwash Soln Take 5 mLs by mouth 3 (three) times daily. Swish and spit   nystatin 100000 UNIT/ML suspension Commonly known as:  MYCOSTATIN Take 5 mLs by mouth 4 (four) times daily.   ondansetron 4 MG disintegrating tablet Commonly known as:  ZOFRAN-ODT Take 4 mg by mouth every 8 (eight) hours as needed for nausea or vomiting.   oxyCODONE 5 MG immediate release tablet Commonly known as:  Oxy IR/ROXICODONE Take 1 tablet (5 mg total) by mouth every 6 (six) hours as needed for severe pain.   polyethylene glycol packet Commonly known as:  MIRALAX / GLYCOLAX Take 17 g by mouth daily as needed for moderate constipation.   senna-docusate 8.6-50 MG tablet Commonly known as:  Senokot-S Take 2 tablets by mouth at bedtime.   simvastatin 40 MG tablet Commonly known as:  ZOCOR Take 1 tablet (40 mg total) by mouth daily at 6 PM.   venlafaxine XR 150 MG 24 hr capsule Commonly known as:  EFFEXOR-XR Take 150 mg by mouth at bedtime.   Vitamin D3 1000 units Caps Take 3,000 Units by mouth at bedtime.        DISCHARGE INSTRUCTIONS:   Follow-up with Dr. rehab 1 day Follow-up gastroenterology 3 weeks Follow-up vascular surgery in 2 weeks  If you experience worsening of your admission symptoms, develop shortness of breath, life threatening emergency, suicidal or homicidal thoughts you must seek medical attention immediately by calling 911 or calling your MD immediately  if  symptoms less severe.  You Must read complete instructions/literature along with all the possible adverse reactions/side effects for all the Medicines you take and that have been prescribed to you. Take any new Medicines after you have completely understood and accept all the possible adverse reactions/side effects.   Please note  You were cared for by a hospitalist during your hospital stay. If you have any questions about your discharge medications or the care you received while you were in the hospital after you are discharged, you can call the unit and asked to speak with the hospitalist on call if the hospitalist that took care of you is not available. Once you are discharged, your primary care physician will handle any further medical issues. Please note that NO REFILLS for any discharge medications will be authorized once you are discharged, as it is imperative that you return to your primary care physician (or establish a relationship with  a primary care physician if you do not have one) for your aftercare needs so that they can reassess your need for medications and monitor your lab values.    Today   CHIEF COMPLAINT:   Chief Complaint  Patient presents with  . Emesis    HISTORY OF PRESENT ILLNESS:  Sonya [name]  is a 84 y.o. female came in with vomiting   VITAL SIGNS:  Blood pressure 116/62, pulse 64, temperature 97.6 F (36.4 C), temperature source Oral, resp. rate 16, height 5' (1.524 m), weight 54.5 kg, SpO2 94 %.    PHYSICAL EXAMINATION:  GENERAL:  84 y.o.-year-old patient lying in the bed with no acute distress.  EYES: Pupils equal, round, reactive to light and accommodation. No scleral icterus. Extraocular muscles intact.  HEENT: Head atraumatic, normocephalic. Oropharynx and nasopharynx clear.  NECK:  Supple, no jugular venous distention. No thyroid enlargement, no tenderness.  LUNGS: Normal breath sounds bilaterally, no wheezing, rales,rhonchi or crepitation. No  use of accessory muscles of respiration.  CARDIOVASCULAR: S1, S2 normal. No murmurs, rubs, or gallops.  ABDOMEN: Soft, non-tender, non-distended. Bowel sounds present. No organomegaly or mass.  EXTREMITIES: Trace pedal edema.  No cyanosis, or clubbing.  NEUROLOGIC: Cranial nerves II through XII are intact. Muscle strength 5/5 in all extremities. Sensation intact. Gait not checked.  PSYCHIATRIC: The patient is alert and oriented x 3.  SKIN: Left toes a little purplish in nature.  DATA REVIEW:   CBC Recent Labs  Lab 11/16/17 0602  WBC 7.9  HGB 11.4*  HCT 34.0*  PLT 276    Chemistries  Recent Labs  Lab 11/11/17 0544  NA 142  K 3.6  CL 113*  CO2 24  GLUCOSE 79  BUN 11  CREATININE 0.82  CALCIUM 7.9*  AST 23  ALT 10  ALKPHOS 69  BILITOT 0.5    Microbiology Results  Results for orders placed or performed during the hospital encounter of 10/30/17  Urine culture     Status: None   Collection Time: 10/30/17 11:32 AM  Result Value Ref Range Status   Specimen Description   Final    URINE, RANDOM Performed at [facility], [address] [place], [place] [address]    Special Requests   Final    NONE Performed at [facility], [address] [place], [place] [address]    Culture   Final    NO GROWTH Performed at [facility], [place] [address], [place], [place] [address]    Report Status 10/31/2017 FINAL  Final  MRSA PCR Screening     Status: Abnormal   Collection Time: 10/30/17  6:37 PM  Result Value Ref Range Status   MRSA by PCR POSITIVE (A) NEGATIVE Final    Comment:        The GeneXpert MRSA Assay (FDA approved for NASAL specimens only), is one component of a comprehensive MRSA colonization surveillance program. It is not intended to diagnose MRSA infection nor to guide or monitor treatment for MRSA infections. RESULT CALLED TO, READ BACK BY AND VERIFIED WITH: [facility] @1952  10/30/17 AKT Performed at [facility],  [address] [place], [place] [address]     Management plans discussed with the patient, family and they are in agreement.  CODE STATUS:     Code Status Orders  (From admission, onward)         Start     Ordered   11/06/17 0040  Do not attempt resuscitation (DNR)  Continuous    Question Answer Comment  In the event of cardiac or respiratory ARREST Do not call a "code blue"   In the event of cardiac or respiratory ARREST Do not perform Intubation, CPR, defibrillation or ACLS   In the event of cardiac or respiratory ARREST Use medication by any route, position, wound care, and other measures to relive pain and suffering. May use oxygen, suction and manual treatment of airway obstruction as needed for comfort.      11/06/17 0039        Code Status History    Date Active Date Inactive Code Status Order ID Comments User Context   10/30/2017 1648 11/03/2017 1703 DNR [id number]  Dustin Flock, MD Inpatient   10/12/2017 2234 10/15/2017 2130 Full Code [id number]  Vianne Bulls, MD ED      TOTAL TIME TAKING CARE OF THIS PATIENT: 35 minutes.    Loletha Grayer M.D on 11/16/2017 at 8:43 AM  Between 7am to 6pm - Pager - [phone]  After 6pm go to [email or web address] - password [email or web address] [facility]  [phone]  CC: Primary care physician; Baxter Hire, MD

## 2017-11-16 NOTE — Progress Notes (Signed)
Sonya [name]  A and O x 4. VSS. Pt tolerating diet well. No complaints of pain or nausea. IV removed intact, prescriptions given. Pt voiced understanding of discharge instructions with no further questions. Pt discharged via EMS.     Allergies as of 11/16/2017      Reactions   Clopidogrel Nausea And Vomiting    reported by [place] 11/24/13   Omeprazole Other (See Comments)   Unknown reaction - reported by [place] 11/24/13      Medication List    STOP taking these medications   benazepril 20 MG tablet Commonly known as:  LOTENSIN   metoprolol tartrate 25 MG tablet Commonly known as:  LOPRESSOR     TAKE these medications   acetaminophen 325 MG tablet Commonly known as:  TYLENOL Take 2 tablets (650 mg total) by mouth every 4 (four) hours as needed for mild pain (or temp > 37.5 C (99.5 F)).   amLODipine 10 MG tablet Commonly known as:  NORVASC Take 1 tablet (10 mg total) by mouth daily.   apixaban 5 MG Tabs tablet Commonly known as:  ELIQUIS Two tablets twice a day through 11/17/2017 then one tablet twice a day starting 11/18/2017   aspirin 81 MG EC tablet Take 1 tablet (81 mg total) by mouth daily.   b complex vitamins tablet Take 1 tablet by mouth daily.   cloNIDine 0.2 MG tablet Commonly known as:  CATAPRES Take 1 tablet (0.2 mg total) by mouth 2 (two) times daily.   DUCODYL 5 MG EC tablet Generic drug:  bisacodyl Take 10 mg by mouth daily.   famotidine 20 MG tablet Commonly known as:  PEPCID Take 1 tablet (20 mg total) by mouth 2 (two) times daily.   ibuprofen 400 MG tablet Commonly known as:  ADVIL,MOTRIN Take 1 tablet (400 mg total) by mouth every 8 (eight) hours as needed for moderate pain.   lansoprazole 15 MG capsule Commonly known as:  PREVACID Take 15 mg by mouth at bedtime.   levothyroxine 25 MCG tablet Commonly known as:  SYNTHROID, LEVOTHROID Take 25 mcg by mouth daily before breakfast.   magic  mouthwash Soln Take 5 mLs by mouth 3 (three) times daily. Swish and spit   nystatin 100000 UNIT/ML suspension Commonly known as:  MYCOSTATIN Take 5 mLs by mouth 4 (four) times daily.   ondansetron 4 MG disintegrating tablet Commonly known as:  ZOFRAN-ODT Take 4 mg by mouth every 8 (eight) hours as needed for nausea or vomiting.   oxyCODONE 5 MG immediate release tablet Commonly known as:  Oxy IR/ROXICODONE Take 1 tablet (5 mg total) by mouth every 6 (six) hours as needed for severe pain.   polyethylene glycol packet Commonly known as:  MIRALAX / GLYCOLAX Take 17 g by mouth daily as needed for moderate constipation.   senna-docusate 8.6-50 MG tablet Commonly known as:  Senokot-S Take 2 tablets by mouth at bedtime.   simvastatin 40 MG tablet Commonly known as:  ZOCOR Take 1 tablet (40 mg total) by mouth daily at 6 PM.   venlafaxine XR 150 MG 24 hr capsule Commonly known as:  EFFEXOR-XR Take 150 mg by mouth at bedtime.   Vitamin D3 1000 units Caps Take 3,000 Units by mouth at bedtime.       Vitals:   11/16/17 0641 11/16/17 1554  BP: 116/62 119/66  Pulse: 64 66  Resp: 16 17  Temp: 97.6 F (36.4 C) 97.9 F (36.6 C)  SpO2: 94% 96%    Francesco Sor

## 2017-11-16 NOTE — Progress Notes (Signed)
I called report to [facility] for patient's discharge. Spoke to [employer], Therapist, sports.

## 2017-11-16 NOTE — Clinical Social Work Note (Signed)
CSW received auth from insurance: [phone]. CSW provided the [place] information to [place] at [place]. Patient can discharge today. Discharge information has been sent. Shela Leff MSW,LCSW [phone]

## 2017-11-19 DIAGNOSIS — S42009A Fracture of unspecified part of unspecified clavicle, initial encounter for closed fracture: Secondary | ICD-10-CM | POA: Insufficient documentation

## 2017-11-25 ENCOUNTER — Encounter: Payer: Self-pay | Admitting: *Deleted

## 2017-11-25 ENCOUNTER — Ambulatory Visit: Payer: Medicare PPO | Admitting: Gastroenterology

## 2017-11-25 DIAGNOSIS — K8050 Calculus of bile duct without cholangitis or cholecystitis without obstruction: Secondary | ICD-10-CM

## 2017-11-26 ENCOUNTER — Ambulatory Visit (INDEPENDENT_AMBULATORY_CARE_PROVIDER_SITE_OTHER): Payer: Medicare PPO | Admitting: Adult Health

## 2017-11-26 ENCOUNTER — Other Ambulatory Visit: Payer: Self-pay | Admitting: Neurology

## 2017-11-26 ENCOUNTER — Encounter: Payer: Self-pay | Admitting: Adult Health

## 2017-11-26 VITALS — BP 139/80 | HR 98 | Ht 60.0 in

## 2017-11-26 DIAGNOSIS — Q211 Atrial septal defect: Secondary | ICD-10-CM

## 2017-11-26 DIAGNOSIS — G8194 Hemiplegia, unspecified affecting left nondominant side: Secondary | ICD-10-CM

## 2017-11-26 DIAGNOSIS — I63411 Cerebral infarction due to embolism of right middle cerebral artery: Secondary | ICD-10-CM | POA: Diagnosis not present

## 2017-11-26 DIAGNOSIS — I824Z2 Acute embolism and thrombosis of unspecified deep veins of left distal lower extremity: Secondary | ICD-10-CM

## 2017-11-26 DIAGNOSIS — E785 Hyperlipidemia, unspecified: Secondary | ICD-10-CM | POA: Diagnosis not present

## 2017-11-26 DIAGNOSIS — Q2112 Patent foramen ovale: Secondary | ICD-10-CM

## 2017-11-26 DIAGNOSIS — I10 Essential (primary) hypertension: Secondary | ICD-10-CM

## 2017-11-26 DIAGNOSIS — I82A12 Acute embolism and thrombosis of left axillary vein: Secondary | ICD-10-CM

## 2017-11-26 NOTE — Progress Notes (Signed)
[facility] [address] [place]. [place] [address] [phone]       OFFICE FOLLOW UP NOTE  [name] Date of Birth:  Mar 13, 1932 Medical Record Number:  [id number]   Reason for Referral:  hospital stroke follow up  CHIEF COMPLAINT:  Chief Complaint  Patient presents with  . Follow-up    Hospital Stroke follow up room in back hallway pt with grandson  [name] pt at [facility]     HPI: Sonya [name] is being seen today for initial visit in the office for multiple cortical infarcts on 10/12/2017. History obtained from patient, grandson and chart review. Reviewed all radiology images and labs personally.  Sonya [name] is a 84 y.o. female with history of HTN, hypothyroidism, CKD stage III, CAD who presented with L HP and L facial droop along with generalized weakness and fatigue for the past week.  CT head reviewed and was negative for acute infarct.  MRI brain reviewed and showed right posterior insula, frontal operculum and posterior lateral frontal lobe infarct along with right MCA thrombus and small vessel disease.  CTA head and neck showed right M2 branch occlusion, bilateral VA origin severe stenosis, right ICA <50% stenosis, left ICA 50-70 percent stenosis and left subclavian severe > 70% stenosis.  Repeat CT head due to neuro worsening which showed extension of the right MCA infarct.  2D echo showed an EF of 60 to 65% without cardiac source of embolus but did show moderate pulmonary hypertension and LA enlargement.  Lower extremity venous Dopplers negative for DVT.  Due to embolic infarcts secondary to multiple intracranial atherosclerosis versus cardioembolic source, was recommended for patient to undergo 30-day cardiac monitoring to rule out atrial fibrillation at this time due to severe neuro deficit.  It was recommended if it is unrevealing and neuro condition improves, will be considered to perform TEE with loop recorder placement at that  time.  LDL 76 and recommended continuation of Zocor 40 mg daily.  Patient was found to be in hypertensive urgency upon arrival with SBP 230s and after antihypertensive adjustments, blood pressure stabilized and recommended long-term BP goal 1 30-1 50 due to intracranial stenosis.  A1c 5.9.  Aspirin 81 mg daily PTA and recommended aspirin 325 mg daily as patient is intolerant to Plavix due to nausea.  Therapies recommended discharge to SNF with diet recommendations of dysphagia 1 with NTL and was discharged in stable condition.  Since hospital discharge, patient has been evaluated in ED due to N/V and abdominal pain.  She underwent EGD on 11/06/2017 which showed hiatal hernia, gastric erythema and esophageal stricture along with undergoing ERCP on 11/10/2017 for choledocholithiasis.  Per notes, GI recommended surgical evaluation for cholecystectomy but due to recent stroke and multiple comorbidities it was determined that she is high risk and recommended for close follow-up to consider this in the near future.  She was also found to have a DVT in her left lower extremity involving the left common femoral vein, profunda femoral vein, femoral vein, popliteal vein and deep calf veins along with superficial venous thrombosis involving a segment of the left basilic vein from just above the elbow into the mid upper arm.  Patient was started on Eliquis for DVT management and treatment.  Recommended outpatient vascular surgery follow-up 2 weeks post discharge for repeat venous duplex.  Patient was discharged back to [facility] for continued rehab in stable condition.  Patient is being seen today for hospital follow-up and is accompanied by her  grandson.  She continues to reside [place] where she receives PT/OT/ST.  She continues to have left hemiplegia/paresis but she does feel as though it has improved some such as being able to shrug her shoulder and slightly below the left her leg up.  She did  sustain a fall last week when she did not wait for assistance to be brought to the bathroom and attempted to ambulate independently.  She fell on her right side where she sustained a fractured collarbone and bruising on her hip and face.  She is currently wearing a sling on her right arm but otherwise recovering well.  She continues to take aspirin and Eliquis without side effects of bleeding or bruising.  Continues to take simvastatin without side effects of myalgias.  Blood pressure today satisfactory 139/80.  She was living independently before her stroke and being completely active and she is eager to get back to this point.  No further concerns at this time.  Denies new or worsening stroke/TIA symptoms.    ROS:   14 system review of systems performed and negative with exception of weakness, easy bruising and dizziness  PMH:  Past Medical History:  Diagnosis Date  . CAD (coronary artery disease)   . Carpal tunnel syndrome   . GERD (gastroesophageal reflux disease)   . History of shingles   . Hormone receptor positive breast cancer ([place])   . Hyperlipemia   . Hypertension   . Stroke [facility])     PSH:  Past Surgical History:  Procedure Laterality Date  . ABDOMINAL HYSTERECTOMY    . APPENDECTOMY    . cornoary angioplasty    . ENDOSCOPIC RETROGRADE CHOLANGIOPANCREATOGRAPHY (ERCP) WITH PROPOFOL N/A 11/10/2017   Procedure: ENDOSCOPIC RETROGRADE CHOLANGIOPANCREATOGRAPHY (ERCP) WITH PROPOFOL;  Surgeon: Lucilla Lame, MD;  Location: [facility] ENDOSCOPY;  Service: Endoscopy;  Laterality: N/A;  . ESOPHAGOGASTRODUODENOSCOPY (EGD) WITH PROPOFOL N/A 11/06/2017   Procedure: ESOPHAGOGASTRODUODENOSCOPY (EGD) WITH PROPOFOL;  Surgeon: Lucilla Lame, MD;  Location: [facility] ENDOSCOPY;  Service: Endoscopy;  Laterality: N/A;    Social History:  Social History   Socioeconomic History  . Marital status: Married    Spouse name: Not on file  . Number of children: Not on file  . Years of education: Not on file  .  Highest education level: Not on file  Occupational History  . Not on file  Social Needs  . Financial resource strain: Not on file  . Food insecurity:    Worry: Not on file    Inability: Not on file  . Transportation needs:    Medical: Not on file    Non-medical: Not on file  Tobacco Use  . Smoking status: Former Research scientist (life sciences)  . Smokeless tobacco: Never Used  Substance and Sexual Activity  . Alcohol use: Not Currently  . Drug use: Never  . Sexual activity: Not on file  Lifestyle  . Physical activity:    Days per week: Not on file    Minutes per session: Not on file  . Stress: Not on file  Relationships  . Social connections:    Talks on phone: Not on file    Gets together: Not on file    Attends religious service: Not on file    Active member of club or organization: Not on file    Attends meetings of clubs or organizations: Not on file    Relationship status: Not on file  . Intimate partner violence:    Fear of current or ex partner: Not  on file    Emotionally abused: Not on file    Physically abused: Not on file    Forced sexual activity: Not on file  Other Topics Concern  . Not on file  Social History Narrative  . Not on file    Family History:  Family History  Problem Relation Age of Onset  . Hypertension Son   . Ovarian cancer Mother   . Stroke Father   . Stroke Son     Medications:   Current Outpatient Medications on File Prior to Visit  Medication Sig Dispense Refill  . acetaminophen (TYLENOL) 325 MG tablet Take 2 tablets (650 mg total) by mouth every 4 (four) hours as needed for mild pain (or temp > 37.5 C (99.5 F)). 30 tablet 3  . amLODipine (NORVASC) 10 MG tablet Take 1 tablet (10 mg total) by mouth daily. 30 tablet 0  . apixaban (ELIQUIS) 5 MG TABS tablet Two tablets twice a day through 11/17/2017 then one tablet twice a day starting 11/18/2017 66 tablet 0  . aspirin EC 81 MG EC tablet Take 1 tablet (81 mg total) by mouth daily. 180 tablet 0  . b complex  vitamins tablet Take 1 tablet by mouth daily.    . bisacodyl (DUCODYL) 5 MG EC tablet Take 10 mg by mouth daily.    . Cholecalciferol (VITAMIN D3) 1000 units CAPS Take 3,000 Units by mouth at bedtime.     . cloNIDine (CATAPRES) 0.2 MG tablet Take 1 tablet (0.2 mg total) by mouth 2 (two) times daily. 60 tablet 3  . famotidine (PEPCID) 20 MG tablet Take 1 tablet (20 mg total) by mouth 2 (two) times daily. 60 tablet 0  . ibuprofen (ADVIL,MOTRIN) 400 MG tablet Take 1 tablet (400 mg total) by mouth every 8 (eight) hours as needed for moderate pain. 20 tablet 0  . lansoprazole (PREVACID) 15 MG capsule Take 15 mg by mouth at bedtime.     Marland Kitchen levothyroxine (SYNTHROID, LEVOTHROID) 25 MCG tablet Take 25 mcg by mouth daily before breakfast.     . magic mouthwash SOLN Take 5 mLs by mouth 3 (three) times daily. Swish and spit    . nystatin (MYCOSTATIN) 100000 UNIT/ML suspension Take 5 mLs by mouth 4 (four) times daily.    . ondansetron (ZOFRAN-ODT) 4 MG disintegrating tablet Take 4 mg by mouth every 8 (eight) hours as needed for nausea or vomiting.    Marland Kitchen oxyCODONE (OXY IR/ROXICODONE) 5 MG immediate release tablet Take 1 tablet (5 mg total) by mouth every 6 (six) hours as needed for severe pain. 15 tablet 0  . polyethylene glycol (MIRALAX / GLYCOLAX) packet Take 17 g by mouth daily as needed for moderate constipation. 30 each 0  . senna-docusate (SENOKOT-S) 8.6-50 MG tablet Take 2 tablets by mouth at bedtime. 60 tablet 2  . simvastatin (ZOCOR) 40 MG tablet Take 1 tablet (40 mg total) by mouth daily at 6 PM. 30 tablet 3  . venlafaxine XR (EFFEXOR-XR) 150 MG 24 hr capsule Take 150 mg by mouth at bedtime.     No current facility-administered medications on file prior to visit.     Allergies:   Allergies  Allergen Reactions  . Clopidogrel Nausea And Vomiting     reported by [place] 11/24/13  . Omeprazole Other (See Comments)    Unknown reaction - reported by [place]  11/24/13     Physical Exam  Vitals:   11/26/17 1443  BP: 139/80  Pulse: 98  Height: 5' (1.524 m)   Body mass index is 23.47 kg/m. No exam data present  General: well developed, well nourished, pleasant elderly Caucasian female, seated, in no evident distress Head: head normocephalic and atraumatic.   Neck: supple with no carotid or supraclavicular bruits Cardiovascular: regular rate and rhythm, no murmurs Musculoskeletal: no deformity Skin:  no rash/petichiae Vascular:  Normal pulses all extremities  Neurologic Exam Mental Status: Awake and fully alert. Oriented to place and time. Recent and remote memory intact. Attention span, concentration and fund of knowledge appropriate. Mood and affect appropriate.  Cranial Nerves: Fundoscopic exam reveals sharp disc margins. Pupils equal, briskly reactive to light. Extraocular movements full without nystagmus. Visual fields full to confrontation. Hearing intact. Facial sensation intact.  Left lower facial paralysis. Motor: Normal bulk and tone.  LUE: 2/5 shoulder shrug, 0/5 rest of arm; LLE: 2/5 Sensory.: intact to touch , pinprick , position and vibratory sensation.  Coordination: Rapid alternating movements normal on right side.  Finger-to-nose and heel-to-shin performed accurately on the right side. Gait and Station: Patient is wheelchair-bound at this time therefore gait assessment deferred Reflexes: 2+ and symmetric. Toes downgoing.    NIHSS  7 Modified Rankin  4    Diagnostic Data (Labs, Imaging, Testing)  CT HEAD WO CONTRAST 10/12/2017 IMPRESSION: No acute intracranial abnormality. Atrophy, chronic microvascular disease  MR BRAIN WO CONTRAST 10/13/2017 IMPRESSION: 1. Acute/early subacute infarction involving the right posterior insula, frontal operculum, and posterolateral frontal lobe. No hemorrhage or mass effect. 2. Linear 7 mm focus of susceptibility hypointensity in right sylvian fissure, likely a MCA branch  vessel thrombus. 3. Mild for age chronic microvascular ischemic changes and volume loss of the brain.  CT ANGIO HEAD W OR WO CONTRAST CT ANGIO NECK W OR WO CONTRAST 10/13/2017 IMPRESSION: 1. Right mid to distal M2 branch occlusion within mid sylvian fissure. 2. No additional intracranial large vessel occlusion, aneurysm, or significant stenosis. 3. Bilateral vertebral artery origin severe stenosis with calcified plaque. 4. Right proximal ICA mild less than 50% stenosis with calcified plaque. 5. Left proximal ICA moderate 50-70% stenosis with calcified plaque. 6. Left subclavian artery severe greater than 70% proximal stenosis with calcified plaque.  CT HEAD WO CONTRAST 10/14/2017 IMPRESSION: 1. Evolving moderate sized acute to early subacute ischemic right MCA territory infarct, slightly increased in size relative to previous MRI. No significant edema or evidence for hemorrhagic transformation. 2. Otherwise stable appearance of the brain. No other acute intracranial abnormality identified.  ECHOCARDIOGRAM 10/13/2017 Study Conclusions - Left ventricle: The cavity size was normal. Wall thickness was   increased in a pattern of mild LVH. Systolic function was normal.   The estimated ejection fraction was in the range of 60% to 65%.   Wall motion was normal; there were no regional wall motion   abnormalities. Features are consistent with a pseudonormal left   ventricular filling pattern, with concomitant abnormal relaxation   and increased filling pressure (grade 2 diastolic dysfunction).   E/medial e&' > 15, suggesting LV end diastolic pressure at least   20 mmHg. - Aortic valve: There was no stenosis. - Mitral valve: There was trivial regurgitation. - Left atrium: The atrium was mildly dilated. - Right ventricle: The cavity size was normal. Systolic function   was normal. - Right atrium: The atrium was mildly dilated. - Tricuspid valve: Peak RV-RA gradient (S): 47 mm Hg. -  Pulmonary arteries: PA peak pressure: 50 mm Hg (S). - Inferior vena cava: The vessel was  normal in size. The   respirophasic diameter changes were in the normal range (>= 50%),   consistent with normal central venous pressure.  VAS [place] LOWER EXTREMITY VENOUS BILAT (DVT) 10/15/2017 Final Interpretation: Right: There is no evidence of deep vein thrombosis in the lower extremity. No cystic structure found in the popliteal fossa. Left: There is no evidence of deep vein thrombosis in the lower extremity. However, portions of this examination were limited- see technologist comments above. No cystic structure found in the popliteal fossa.  US venous img lower unilateral left 11/11/2017 IMPRESSION: Positive for deep venous thrombosis in the left lower extremity. Thrombus involving the left common femoral vein, profunda femoral vein, femoral vein, popliteal vein and deep calf veins.  US venous img upper unilateral left 11/12/2017 IMPRESSION: Positive for superficial venous thrombosis involving a segment of the left basilic vein from just above the elbow into the mid upper arm.   ASSESSMENT: Sonya [name] is a 84 y.o. 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 Vascular risk factors include HTN, HLD, CKD.  After hospital discharge, patient return to ED where she underwent ERCP on 11/10/2017 due to choledocholithiasis along with left lower extremity DVT and superficial right upper extremity DVT and was started on Eliquis.  Patient is being seen today for hospital follow-up and does continue to have left hemiplegia/hemiparesis but has been making improvements with therapy.    PLAN: -Continue aspirin 81 mg daily and Eliquis (apixaban) daily  and Zocor for secondary stroke prevention -Order placed for TCD with bubble study (placed by Dr. Leonie Man) to assess for possible PFO with  recent finding of DVT for possible cause of stroke -F/U with vascular surgery as scheduled on 12/03/2017 for follow-up of DVT -f/u with cardiology as scheduled on 12/09/2017 -F/u with PCP regarding your HLD and HTN management -Continue PT/OT/ST at facility for continued deficits -continue to monitor BP at home -advised to continue to stay active and maintain a healthy diet -Maintain strict control of hypertension with blood pressure goal between 1 30-1 50, diabetes with hemoglobin A1c goal below 6.5% and cholesterol with LDL cholesterol (bad cholesterol) goal below 70 mg/dL. I also advised the patient to eat a healthy diet with plenty of whole grains, cereals, fruits and vegetables, exercise regularly and maintain ideal body weight.  Follow up in 3 months or call earlier if needed   Greater than 50% of time during this 25 minute visit was spent on counseling,explanation of diagnosis of right MCA infarct, reviewing risk factor management of HTN, HLD, CKD, DVT and left hemiparesis/plegia, planning of further management, discussion with patient and family and coordination of care    Venancio Poisson, AGNP-BC  [facility] [address] [place], [place] [address]  Phone [phone] Fax [phone] Note: This document was prepared with digital dictation and possible smart phrase technology. Any transcriptional errors that result from this process are unintentional.

## 2017-11-26 NOTE — Patient Instructions (Addendum)
Continue aspirin 81 mg daily and Eliquis (apixaban) daily  and Zocor  for secondary stroke prevention  Continue to follow up with PCP regarding cholesterol and blood pressure management   You will be called to schedule TCD with bubble study to look for possible PFO  Follow up with vascular provider and cardiology as scheduled  Continue current therapies for continued deficits  Continue to monitor blood pressure at home  Maintain strict control of hypertension with blood pressure between 130-150, diabetes with hemoglobin A1c goal below 6.5% and cholesterol with LDL cholesterol (bad cholesterol) goal below 70 mg/dL. I also advised the patient to eat a healthy diet with plenty of whole grains, cereals, fruits and vegetables, exercise regularly and maintain ideal body weight.  Followup in the future with me in 3 months or call earlier if needed       Thank you for coming to see [place] at [facility]. I hope we have been able to provide you high quality care today.  You may receive a patient satisfaction survey over the next few weeks. We would appreciate your feedback and comments so that we may continue to improve ourselves and the health of our patients.

## 2017-11-26 NOTE — Progress Notes (Signed)
I agree with the above plan 

## 2017-11-30 ENCOUNTER — Other Ambulatory Visit (INDEPENDENT_AMBULATORY_CARE_PROVIDER_SITE_OTHER): Payer: Self-pay | Admitting: Vascular Surgery

## 2017-11-30 DIAGNOSIS — I82432 Acute embolism and thrombosis of left popliteal vein: Secondary | ICD-10-CM

## 2017-12-03 ENCOUNTER — Encounter (INDEPENDENT_AMBULATORY_CARE_PROVIDER_SITE_OTHER): Payer: Medicare PPO

## 2017-12-03 ENCOUNTER — Ambulatory Visit (INDEPENDENT_AMBULATORY_CARE_PROVIDER_SITE_OTHER): Payer: Medicare PPO | Admitting: Vascular Surgery

## 2017-12-08 ENCOUNTER — Telehealth: Payer: Self-pay | Admitting: Gastroenterology

## 2017-12-08 NOTE — Telephone Encounter (Signed)
Patient's son came by the [facility] office and Krisha is complaining of nausea and vomiting. She is in rehab for stroke and broken collar bone. Patient not eating well. What does he need to do for her?

## 2017-12-08 NOTE — Telephone Encounter (Signed)
Her son [name] said she had a couple strokes. That maybe why she missed. I did call him.

## 2017-12-08 NOTE — Telephone Encounter (Signed)
Pt really needs an office appt. Will you let her son know? It looks like he only has done procedures on her and one appt she had, she was a no show.

## 2017-12-09 DIAGNOSIS — R000 Tachycardia, unspecified: Secondary | ICD-10-CM | POA: Insufficient documentation

## 2017-12-14 ENCOUNTER — Encounter: Payer: Self-pay | Admitting: Gastroenterology

## 2017-12-14 ENCOUNTER — Ambulatory Visit (INDEPENDENT_AMBULATORY_CARE_PROVIDER_SITE_OTHER): Payer: Medicare PPO | Admitting: Gastroenterology

## 2017-12-14 VITALS — BP 169/79 | HR 114 | Ht 60.0 in

## 2017-12-14 DIAGNOSIS — R112 Nausea with vomiting, unspecified: Secondary | ICD-10-CM

## 2017-12-14 NOTE — Patient Instructions (Addendum)
You are scheduled for a HIDA scan at [facility] on Wednesday Nov 6th at 8:30am.  Please arrive at the medical mall registration desk at 8:15am. You cannot have anything to eat or drink after midnight on Tuesday night. You cannot have any opiate based drug, nausea medication or acid reducers 6 hours prior to scan.  If you need to reschedule this appointment for any reason, please contact central scheduling at [phone].

## 2017-12-14 NOTE — Progress Notes (Signed)
Primary Care Physician: Baxter Hire, MD  Primary Gastroenterologist:  Dr. Lucilla Lame  Chief Complaint  Patient presents with  . Nausea and vomiting    HPI: Sonya [name] is a 84 y.o. female here for nausea vomiting.  The patient was worked in today after morning that she could not wait till her appointment on Thursday.  The patient has had nausea vomiting.  Patient was recently in the hospital for dysphasia and had an upper endoscopy with a mild stricture seen in the distal esophagus that was treated with balloon dilation.  The dilator use was 18 mm.  The patient also had an ERCP in October for common bile duct stones and the stone was removed after sphincterotomy.  She was seen by neurology for multiple cortical infarcts back in August of this year.  The diagnosis at that visit was Acute/early subacute infarction involving the right posterior insula, frontal operculum, and posterolateral frontal lobe. No hemorrhage or mass effect. 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 The patient reports that she has dry heaving with nausea and vomiting regardless of eating.  The patient denies any black stools or bloody stools.  Current Outpatient Medications  Medication Sig Dispense Refill  . acetaminophen (TYLENOL) 325 MG tablet Take 2 tablets (650 mg total) by mouth every 4 (four) hours as needed for mild pain (or temp > 37.5 C (99.5 F)). 30 tablet 3  . amLODipine (NORVASC) 10 MG tablet Take 1 tablet (10 mg total) by mouth daily. 30 tablet 0  . apixaban (ELIQUIS) 5 MG TABS tablet Two tablets twice a day through 11/17/2017 then one tablet twice a day starting 11/18/2017 66 tablet 0  . aspirin EC 81 MG EC tablet Take 1 tablet (81 mg total) by mouth daily. 180 tablet 0  . b complex vitamins tablet Take 1 tablet by mouth daily.    . bisacodyl (DUCODYL) 5 MG EC tablet Take 10 mg by mouth daily.    . Cholecalciferol (VITAMIN  D3) 1000 units CAPS Take 3,000 Units by mouth at bedtime.     . cloNIDine (CATAPRES) 0.2 MG tablet Take 1 tablet (0.2 mg total) by mouth 2 (two) times daily. 60 tablet 3  . famotidine (PEPCID) 20 MG tablet Take 1 tablet (20 mg total) by mouth 2 (two) times daily. 60 tablet 0  . ibuprofen (ADVIL,MOTRIN) 400 MG tablet Take 1 tablet (400 mg total) by mouth every 8 (eight) hours as needed for moderate pain. 20 tablet 0  . lansoprazole (PREVACID) 15 MG capsule Take 15 mg by mouth at bedtime.     Marland Kitchen levothyroxine (SYNTHROID, LEVOTHROID) 25 MCG tablet Take 25 mcg by mouth daily before breakfast.     . magic mouthwash SOLN Take 5 mLs by mouth 3 (three) times daily. Swish and spit    . nystatin (MYCOSTATIN) 100000 UNIT/ML suspension Take 5 mLs by mouth 4 (four) times daily.    . ondansetron (ZOFRAN-ODT) 4 MG disintegrating tablet Take 4 mg by mouth every 8 (eight) hours as needed for nausea or vomiting.    Marland Kitchen oxyCODONE (OXY IR/ROXICODONE) 5 MG immediate release tablet Take 1 tablet (5 mg total) by mouth every 6 (six) hours as needed for severe pain. 15 tablet 0  . polyethylene glycol (MIRALAX / GLYCOLAX) packet Take 17 g by mouth daily as needed for moderate constipation. 30 each 0  . senna-docusate (SENOKOT-S) 8.6-50 MG tablet Take 2 tablets by mouth  at bedtime. 60 tablet 2  . simvastatin (ZOCOR) 40 MG tablet Take 1 tablet (40 mg total) by mouth daily at 6 PM. 30 tablet 3  . venlafaxine XR (EFFEXOR-XR) 150 MG 24 hr capsule Take 150 mg by mouth at bedtime.     No current facility-administered medications for this visit.     Allergies as of 12/14/2017 - Review Complete 12/14/2017  Allergen Reaction Noted  . Clopidogrel Nausea And Vomiting 10/12/2017  . Omeprazole Other (See Comments) 10/12/2017    ROS:  General: Negative for anorexia, weight loss, fever, chills, fatigue, weakness. ENT: Negative for hoarseness, difficulty swallowing , nasal congestion. CV: Negative for chest pain, angina,  palpitations, dyspnea on exertion, peripheral edema.  Respiratory: Negative for dyspnea at rest, dyspnea on exertion, cough, sputum, wheezing.  GI: See history of present illness. GU:  Negative for dysuria, hematuria, urinary incontinence, urinary frequency, nocturnal urination.  Endo: Negative for unusual weight change.    Physical Examination:   BP (!) 169/79   Pulse (!) 114   Ht 5' (1.524 m)   BMI 23.47 kg/m   General: Well-nourished, well-developed in no acute distress.  Eyes: No icterus. Conjunctivae pink. Mouth: Oropharyngeal mucosa moist and pink , no lesions erythema or exudate. Lungs: Clear to auscultation bilaterally. Non-labored. Heart: Regular rate and rhythm, no murmurs rubs or gallops.  Abdomen: Bowel sounds are normal, nontender, nondistended, no hepatosplenomegaly or masses, no abdominal bruits or hernia , no rebound or guarding.   Extremities: No lower extremity edema. No clubbing or deformities. Neuro: Alert and oriented x 3.  Patient in wheel chair. Skin: Warm and dry, no jaundice.   Psych: Alert and cooperative, normal mood and affect.  Labs:    Imaging Studies: No results found.  Assessment and Plan:   Sonya [name] is a 84 y.o. y/o female with nausea and vomiting that is intractable.  The patient's nausea vomiting is not associate with any eating or drinking.  The patient has recent infarcts of the brain and a possible clot in the MCA.  It is more likely that the patient's chronic nausea with intractable nausea and vomiting is from a central cause.  The patient will have a gallbladder emptying study and will have her PPI doubled to see if this will help any of her symptoms.  The patient is also been told to bring up her nausea or issues with her neurologist who she has an upcoming appointment with.  The patient has been explained the plan and agrees with it.    Lucilla Lame, MD. Marval Regal   Note: This dictation was prepared with Dragon dictation along with  smaller phrase technology. Any transcriptional errors that result from this process are unintentional.

## 2017-12-17 ENCOUNTER — Ambulatory Visit: Payer: Medicare PPO | Admitting: Gastroenterology

## 2017-12-23 ENCOUNTER — Other Ambulatory Visit: Payer: Self-pay | Admitting: Gastroenterology

## 2017-12-23 ENCOUNTER — Encounter
Admission: RE | Admit: 2017-12-23 | Discharge: 2017-12-23 | Disposition: A | Discharge status: Home / Self Care | Payer: Medicare PPO | Source: Ambulatory Visit | Attending: Gastroenterology | Admitting: Gastroenterology

## 2017-12-23 DIAGNOSIS — R112 Nausea with vomiting, unspecified: Secondary | ICD-10-CM | POA: Diagnosis present

## 2017-12-23 MED ORDER — TECHNETIUM TC 99M MEBROFENIN IV KIT
4.9000 | PACK | Freq: Once | INTRAVENOUS | Status: AC | PRN
  Administered 2017-12-23: 4.9 via INTRAVENOUS

## 2017-12-24 ENCOUNTER — Other Ambulatory Visit: Payer: Self-pay

## 2017-12-24 ENCOUNTER — Encounter: Payer: Self-pay | Admitting: General Surgery

## 2017-12-24 ENCOUNTER — Ambulatory Visit (INDEPENDENT_AMBULATORY_CARE_PROVIDER_SITE_OTHER): Payer: Medicare PPO | Admitting: General Surgery

## 2017-12-24 VITALS — BP 171/94 | HR 124 | Temp 97.9°F | Resp 20 | Ht 60.0 in | Wt 112.0 lb

## 2017-12-24 DIAGNOSIS — K810 Acute cholecystitis: Secondary | ICD-10-CM

## 2017-12-24 DIAGNOSIS — K8020 Calculus of gallbladder without cholecystitis without obstruction: Secondary | ICD-10-CM | POA: Diagnosis not present

## 2017-12-24 NOTE — Progress Notes (Signed)
Patient ID: Sonya [name], female   DOB: May 23, 1932, 84 y.o.   MRN: [id number]  Chief Complaint  Patient presents with  . Nausea    HPI Sonya [name] is a 84 y.o. female.  Here for evaluation of her gall bladder referred by Dr Servando Snare. She states she has a "bitter" taste in her mouth with nausea that started back in August after a stroke. She states she has been hospitalized for severe impaction on 11-05-17. EGD was 11-06-17. The nausea and vomiting is almost daily and even the thought of food makes her sick as well. She does not admit to abdominal pain. The pain she has is in her left arm and leg (stroke side). HIDA scan was 12-23-17.  Poor visualization of the gallbladder consistent with cholecystitis. She has been staying at [place] since her stroke.  Her son is concerned that her recovery is been impaired due to her poor oral intake. She is here with her son, Aurther Loft.  HPI  Past Medical History:  Diagnosis Date  . CAD (coronary artery disease)   . Carpal tunnel syndrome   . GERD (gastroesophageal reflux disease)   . History of shingles   . Hormone receptor positive breast cancer (HCC)   . Hyperlipemia   . Hypertension   . Stroke [facility])     Past Surgical History:  Procedure Laterality Date  . ABDOMINAL HYSTERECTOMY    . APPENDECTOMY    . cornoary angioplasty    . ENDOSCOPIC RETROGRADE CHOLANGIOPANCREATOGRAPHY (ERCP) WITH PROPOFOL N/A 11/10/2017   Procedure: ENDOSCOPIC RETROGRADE CHOLANGIOPANCREATOGRAPHY (ERCP) WITH PROPOFOL;  Surgeon: Midge Minium, MD;  Location: [facility] ENDOSCOPY;  Service: Endoscopy;  Laterality: N/A;  . ESOPHAGOGASTRODUODENOSCOPY (EGD) WITH PROPOFOL N/A 11/06/2017   Procedure: ESOPHAGOGASTRODUODENOSCOPY (EGD) WITH PROPOFOL;  Surgeon: Midge Minium, MD;  Location: [facility] ENDOSCOPY;  Service: Endoscopy;  Laterality: N/A;    Family History  Problem Relation Age of Onset  . Hypertension Son   . Ovarian cancer Mother   . Stroke Father   . Stroke Son     Social  History Social History   Tobacco Use  . Smoking status: Former Smoker    Packs/day: 0.50    Years: 20.00    Pack years: 10.00    Types: Cigarettes    Last attempt to quit: 02/17/1998    Years since quitting: 19.8  . Smokeless tobacco: Never Used  Substance Use Topics  . Alcohol use: Not Currently  . Drug use: Never    Allergies  Allergen Reactions  . Clopidogrel Nausea And Vomiting     reported by [facility] 11/24/13  . Omeprazole Other (See Comments)    Unknown reaction - reported by [facility] 11/24/13    Current Outpatient Medications  Medication Sig Dispense Refill  . acetaminophen (TYLENOL) 325 MG tablet Take 2 tablets (650 mg total) by mouth every 4 (four) hours as needed for mild pain (or temp > 37.5 C (99.5 F)). 30 tablet 3  . amLODipine (NORVASC) 10 MG tablet Take 1 tablet (10 mg total) by mouth daily. 30 tablet 0  . apixaban (ELIQUIS) 5 MG TABS tablet Two tablets twice a day through 11/17/2017 then one tablet twice a day starting 11/18/2017 66 tablet 0  . aspirin EC 81 MG EC tablet Take 1 tablet (81 mg total) by mouth daily. 180 tablet 0  . b complex vitamins tablet Take 1 tablet by mouth daily.    . bisacodyl (DUCODYL) 5 MG EC tablet Take 10 mg by  mouth daily.    . Cholecalciferol (VITAMIN D3) 1000 units CAPS Take 3,000 Units by mouth at bedtime.     . cloNIDine (CATAPRES) 0.2 MG tablet Take 1 tablet (0.2 mg total) by mouth 2 (two) times daily. 60 tablet 3  . famotidine (PEPCID) 20 MG tablet Take 1 tablet (20 mg total) by mouth 2 (two) times daily. 60 tablet 0  . ibuprofen (ADVIL,MOTRIN) 400 MG tablet Take 1 tablet (400 mg total) by mouth every 8 (eight) hours as needed for moderate pain. 20 tablet 0  . lansoprazole (PREVACID) 15 MG capsule Take 15 mg by mouth at bedtime.     Marland Kitchen levothyroxine (SYNTHROID, LEVOTHROID) 25 MCG tablet Take 25 mcg by mouth daily before breakfast.     . magic mouthwash SOLN Take 5 mLs by mouth 3 (three) times  daily. Swish and spit    . nystatin (MYCOSTATIN) 100000 UNIT/ML suspension Take 5 mLs by mouth 4 (four) times daily.    . ondansetron (ZOFRAN-ODT) 4 MG disintegrating tablet Take 4 mg by mouth every 8 (eight) hours as needed for nausea or vomiting.    Marland Kitchen oxyCODONE (OXY IR/ROXICODONE) 5 MG immediate release tablet Take 1 tablet (5 mg total) by mouth every 6 (six) hours as needed for severe pain. 15 tablet 0  . polyethylene glycol (MIRALAX / GLYCOLAX) packet Take 17 g by mouth daily as needed for moderate constipation. 30 each 0  . senna-docusate (SENOKOT-S) 8.6-50 MG tablet Take 2 tablets by mouth at bedtime. 60 tablet 2  . simvastatin (ZOCOR) 40 MG tablet Take 1 tablet (40 mg total) by mouth daily at 6 PM. 30 tablet 3  . venlafaxine XR (EFFEXOR-XR) 150 MG 24 hr capsule Take 150 mg by mouth at bedtime.     No current facility-administered medications for this visit.     Review of Systems Review of Systems  Constitutional: Negative.   Cardiovascular: Negative.   Gastrointestinal: Positive for constipation, nausea and vomiting. Negative for abdominal pain.    Blood pressure (!) 171/94, pulse (!) 124, temperature 97.9 F (36.6 C), temperature source Skin, resp. rate 20, height 5' (1.524 m), weight 112 lb (50.8 kg), SpO2 93 %. Tachycardia noted at the time of her December 14, 2017 exam at GI.  No weight recorded.  Physical Exam Physical Exam  Constitutional: She is oriented to person, place, and time. She appears well-developed and well-nourished.  HENT:  Mouth/Throat: Oropharynx is clear and moist. No oropharyngeal exudate.  Eyes: Conjunctivae are normal. No scleral icterus.  Neck: Neck supple.  Cardiovascular: Normal rate, regular rhythm and normal heart sounds.  Pulmonary/Chest: Effort normal and breath sounds normal.  Abdominal: Soft. Bowel sounds are normal. There is no tenderness.  Lymphadenopathy:    She has no cervical adenopathy.  Neurological: She is alert and oriented to  person, place, and time.  Flaccid left upper and lower extremity.  Patient awake, alert, conversant.  Skin: Skin is warm and dry.  Psychiatric: Her behavior is normal.  Examination was completed with the patient in her wheelchair.  Benign abdominal finding.  No mass, tenderness, guarding or referred pain.    Data Reviewed Hospital records related to her October 12, 2017 hospitalization with a right hemisphere CVA, October 30, 2017 admission with nausea and vomiting, November 05, 2017 admission with choledocholithiasis and associated imaging studies reviewed.  History of left lower extremity DVT presently being managed with Eliquis.  Personal communication with GI regarding patient's symptoms.   Assessment    Cholelithiasis, previous  choledocholithiasis.  Dense right hemisphere stroke.  Hiatal hernia with history of past dilatation.  Profound food aversion with nausea and vomiting, no postprandial history.    Plan     This is a patient is at very high risk for surgical intervention, and I am not convinced at this time that her cholelithiasis is the source of her profound nausea and vomiting with food avoidance.  She underwent a swallowing exam with speech and language during her hospitalization but this was not a radiologic study.  Review of her CT scan shows a small to medium sized hiatal hernia.  The patient is scheduled for a follow-up echo on December 28, 2017 with cardiology, and will ask them to assess her in regards to her risk for surgical intervention.  Text discussion with the vascular department regarding management of her anticoagulation is been completed.  She would be a candidate for a IVC filter if operated on in the first 3-6 months post CVA.  We will arrange for a barium swallow upper GI to assess if there is any persistent stricture, reflux or bronchial motility/getting the patient's nausea vomiting.   Laparoscopic Cholecystectomy with Intraoperative  Cholangiogram. The procedure, including it's potential risks and complications (including but not limited to infection, bleeding, injury to intra-abdominal organs or bile ducts, bile leak, poor cosmetic result, sepsis and death) were discussed with the patient in detail. Non-operative options, including their inherent risks (acute calculous cholecystitis with possible choledocholithiasis or gallstone pancreatitis, with the risk of ascending cholangitis, sepsis, and death) were discussed as well. The patient expressed and understanding of what we discussed and wishes to proceed with laparoscopic cholecystectomy. The patient further understands that if it is technically not possible, or it is unsafe to proceed laparoscopically, that I will convert to an open cholecystectomy.  The majority of the 50-minute visit was spent with record review.   HPI, Physical Exam, Assessment and Plan have been scribed under the direction and in the presence of Earline Mayotte, MD. Dorathy Daft, RN   I have completed the exam and reviewed the above documentation for accuracy and completeness.  I agree with the above.  Museum/gallery conservator has been used and any errors in dictation or transcription are unintentional.  Donnalee Curry, M.D., F.A.C.S.  Merrily Pew Ricci Paff 12/28/2017, 1:01 PM

## 2017-12-24 NOTE — Patient Instructions (Addendum)
The patient is aware to call back for any questions or new concerns.   Laparoscopic Cholecystectomy Laparoscopic cholecystectomy is surgery to remove the gallbladder. The gallbladder is a pear-shaped organ that lies beneath the liver on the right side of the body. The gallbladder stores bile, which is a fluid that helps the body to digest fats. Cholecystectomy is often done for inflammation of the gallbladder (cholecystitis). This condition is usually caused by a buildup of gallstones (cholelithiasis) in the gallbladder. Gallstones can block the flow of bile, which can result in inflammation and pain. In severe cases, emergency surgery may be required. This procedure is done though small incisions in your abdomen (laparoscopic surgery). A thin scope with a camera (laparoscope) is inserted through one incision. Thin surgical instruments are inserted through the other incisions. In some cases, a laparoscopic procedure may be turned into a type of surgery that is done through a larger incision (open surgery). Tell a health care provider about:  Any allergies you have.  All medicines you are taking, including vitamins, herbs, eye drops, creams, and over-the-counter medicines.  Any problems you or family members have had with anesthetic medicines.  Any blood disorders you have.  Any surgeries you have had.  Any medical conditions you have.  Whether you are pregnant or may be pregnant. What are the risks? Generally, this is a safe procedure. However, problems may occur, including:  Infection.  Bleeding.  Allergic reactions to medicines.  Damage to other structures or organs.  A stone remaining in the common bile duct. The common bile duct carries bile from the gallbladder into the small intestine.  A bile leak from the cyst duct that is clipped when your gallbladder is removed.  What happens before the procedure? Staying hydrated Follow instructions from your health care provider  about hydration, which may include:  Up to 2 hours before the procedure - you may continue to drink clear liquids, such as water, clear fruit juice, black coffee, and plain tea.  Eating and drinking restrictions Follow instructions from your health care provider about eating and drinking, which may include:  8 hours before the procedure - stop eating heavy meals or foods such as meat, fried foods, or fatty foods.  6 hours before the procedure - stop eating light meals or foods, such as toast or cereal.  6 hours before the procedure - stop drinking milk or drinks that contain milk.  2 hours before the procedure - stop drinking clear liquids.  Medicines  Ask your health care provider about: ? Changing or stopping your regular medicines. This is especially important if you are taking diabetes medicines or blood thinners. ? Taking medicines such as aspirin and ibuprofen. These medicines can thin your blood. Do not take these medicines before your procedure if your health care provider instructs you not to.  You may be given antibiotic medicine to help prevent infection. General instructions  Let your health care provider know if you develop a cold or an infection before surgery.  Plan to have someone take you home from the hospital or clinic.  Ask your health care provider how your surgical site will be marked or identified. What happens during the procedure?  To reduce your risk of infection: ? Your health care team will wash or sanitize their hands. ? Your skin will be washed with soap. ? Hair may be removed from the surgical area.  An IV tube may be inserted into one of your veins.  You will be  given one or more of the following: ? A medicine to help you relax (sedative). ? A medicine to make you fall asleep (general anesthetic).  A breathing tube will be placed in your mouth.  Your surgeon will make several small cuts (incisions) in your abdomen.  The laparoscope will be  inserted through one of the small incisions. The camera on the laparoscope will send images to a TV screen (monitor) in the operating room. This lets your surgeon see inside your abdomen.  Air-like gas will be pumped into your abdomen. This will expand your abdomen to give the surgeon more room to perform the surgery.  Other tools that are needed for the procedure will be inserted through the other incisions. The gallbladder will be removed through one of the incisions.  Your common bile duct may be examined. If stones are found in the common bile duct, they may be removed.  After your gallbladder has been removed, the incisions will be closed with stitches (sutures), staples, or skin glue.  Your incisions may be covered with a bandage (dressing). The procedure may vary among health care providers and hospitals. What happens after the procedure?  Your blood pressure, heart rate, breathing rate, and blood oxygen level will be monitored until the medicines you were given have worn off.  You will be given medicines as needed to control your pain.  Do not drive for 24 hours if you were given a sedative. This information is not intended to replace advice given to you by your health care provider. Make sure you discuss any questions you have with your health care provider. Document Released: 02/03/2005 Document Revised: 08/26/2015 Document Reviewed: 07/23/2015 Elsevier Interactive Patient Education  2018 [employer].

## 2017-12-28 ENCOUNTER — Telehealth: Payer: Self-pay

## 2017-12-28 ENCOUNTER — Other Ambulatory Visit: Payer: Self-pay

## 2017-12-28 DIAGNOSIS — R1114 Bilious vomiting: Secondary | ICD-10-CM

## 2017-12-28 NOTE — Telephone Encounter (Signed)
Spoke with Lowella Bandy at [place] home where patient resides. The patient it scheduled for a Barium swallow with Upper GI at [facility] on 01/20/18 at 10:00 am. She will arrive by 9:45 am and have nothing to eat or drink after midnight. She may have needed medications with sips of water in the morning. The patient is aware of date, time, and instructions.

## 2017-12-28 NOTE — Telephone Encounter (Signed)
-----   Message from Robert Bellow, MD sent at 12/28/2017  1:08 PM EST ----- Please arrange for a barium swallow and upper GI to assess profound nausea and vomiting post stroke.  Thank you

## 2017-12-30 ENCOUNTER — Encounter: Payer: Self-pay | Admitting: *Deleted

## 2017-12-30 NOTE — Progress Notes (Signed)
We received cardiac clearance from Dr. Bethanne Ginger office at [facility].   Clearance note was put on Dr. Dwyane Luo desk for his review.

## 2018-01-18 ENCOUNTER — Encounter: Payer: Self-pay | Admitting: General Surgery

## 2018-01-18 NOTE — Progress Notes (Signed)
The patient was evaluated by cardiology and is felt to be at low risk for cardiovascular complications.  Continuation of beta-blockers recommended.  Prior discussion with the vascular service suggested the patient should have an IVC filter placed if intervention was planned before 3-6 months after her DVT.  Upper GI contrast study is pending on December 4.

## 2018-01-20 ENCOUNTER — Ambulatory Visit
Admission: RE | Admit: 2018-01-20 | Discharge: 2018-01-20 | Disposition: A | Discharge status: Home / Self Care | Payer: Medicare PPO | Source: Ambulatory Visit | Attending: General Surgery | Admitting: General Surgery

## 2018-01-20 DIAGNOSIS — R1114 Bilious vomiting: Secondary | ICD-10-CM | POA: Diagnosis present

## 2018-01-21 ENCOUNTER — Telehealth: Payer: Self-pay | Admitting: *Deleted

## 2018-01-21 ENCOUNTER — Other Ambulatory Visit: Payer: Self-pay

## 2018-01-21 DIAGNOSIS — R110 Nausea: Secondary | ICD-10-CM

## 2018-01-21 DIAGNOSIS — K8020 Calculus of gallbladder without cholecystitis without obstruction: Secondary | ICD-10-CM

## 2018-01-21 NOTE — Telephone Encounter (Signed)
-----   Message from Robert Bellow, MD sent at 01/21/2018 10:02 AM EST ----- Please notify the son that the study shows some changes of reflux.  I would like the patient to have an ultrasound of the gallbladder with an office visit to follow. Thanks.  ----- Message ----- From: Interface, Rad Results In Sent: 01/20/2018  12:17 PM EST To: Robert Bellow, MD

## 2018-01-21 NOTE — Telephone Encounter (Signed)
The patient is scheduled for a gallbladder ultrasound at [facility] on 01/29/18 at 9:00 am. She will arrive by 8:45 am and have nothing to eat or drink after midnight the night prior. She will follow up with Dr Bary Castilla on 02/02/18 at 1:00 pm.  I have spoken with Lowella Bandy at [place] where the patient resides and she is aware of dates, times, and instructions.

## 2018-01-21 NOTE — Telephone Encounter (Signed)
Spoke with the patient's son, [name], and he states that since [place] has increased her stomach medication she has had much improvement with her nausea. He is fine with having the gallbladder ultrasound and follow up with Dr Bary Castilla.

## 2018-01-26 ENCOUNTER — Encounter: Payer: Medicare PPO | Admitting: Student

## 2018-01-29 ENCOUNTER — Ambulatory Visit: Payer: Medicare PPO

## 2018-02-01 ENCOUNTER — Ambulatory Visit (HOSPITAL_COMMUNITY): Admission: RE | Admit: 2018-02-01 | Payer: Medicare PPO | Source: Ambulatory Visit

## 2018-02-02 ENCOUNTER — Ambulatory Visit: Payer: Medicare PPO | Admitting: General Surgery

## 2018-02-21 ENCOUNTER — Encounter: Payer: Self-pay | Admitting: Emergency Medicine

## 2018-02-21 ENCOUNTER — Emergency Department: Payer: Medicare PPO

## 2018-02-21 ENCOUNTER — Other Ambulatory Visit: Payer: Self-pay

## 2018-02-21 ENCOUNTER — Inpatient Hospital Stay
Admission: EM | Admit: 2018-02-21 | Discharge: 2018-02-24 | DRG: 872 | Disposition: A | Discharge status: Home Health | Payer: Medicare PPO | Attending: Internal Medicine | Admitting: Internal Medicine

## 2018-02-21 DIAGNOSIS — I160 Hypertensive urgency: Secondary | ICD-10-CM | POA: Diagnosis present

## 2018-02-21 DIAGNOSIS — A419 Sepsis, unspecified organism: Secondary | ICD-10-CM | POA: Diagnosis present

## 2018-02-21 DIAGNOSIS — Z8673 Personal history of transient ischemic attack (TIA), and cerebral infarction without residual deficits: Secondary | ICD-10-CM | POA: Diagnosis not present

## 2018-02-21 DIAGNOSIS — K219 Gastro-esophageal reflux disease without esophagitis: Secondary | ICD-10-CM | POA: Diagnosis present

## 2018-02-21 DIAGNOSIS — Z8041 Family history of malignant neoplasm of ovary: Secondary | ICD-10-CM | POA: Diagnosis not present

## 2018-02-21 DIAGNOSIS — E860 Dehydration: Secondary | ICD-10-CM | POA: Diagnosis not present

## 2018-02-21 DIAGNOSIS — Z79899 Other long term (current) drug therapy: Secondary | ICD-10-CM | POA: Diagnosis not present

## 2018-02-21 DIAGNOSIS — Z79891 Long term (current) use of opiate analgesic: Secondary | ICD-10-CM

## 2018-02-21 DIAGNOSIS — I4891 Unspecified atrial fibrillation: Secondary | ICD-10-CM | POA: Diagnosis present

## 2018-02-21 DIAGNOSIS — E785 Hyperlipidemia, unspecified: Secondary | ICD-10-CM | POA: Diagnosis present

## 2018-02-21 DIAGNOSIS — B9689 Other specified bacterial agents as the cause of diseases classified elsewhere: Secondary | ICD-10-CM | POA: Diagnosis present

## 2018-02-21 DIAGNOSIS — W07XXXA Fall from chair, initial encounter: Secondary | ICD-10-CM | POA: Diagnosis present

## 2018-02-21 DIAGNOSIS — Z79890 Hormone replacement therapy (postmenopausal): Secondary | ICD-10-CM | POA: Diagnosis not present

## 2018-02-21 DIAGNOSIS — Z8249 Family history of ischemic heart disease and other diseases of the circulatory system: Secondary | ICD-10-CM | POA: Diagnosis not present

## 2018-02-21 DIAGNOSIS — R000 Tachycardia, unspecified: Secondary | ICD-10-CM | POA: Diagnosis present

## 2018-02-21 DIAGNOSIS — I10 Essential (primary) hypertension: Secondary | ICD-10-CM | POA: Diagnosis present

## 2018-02-21 DIAGNOSIS — Z8619 Personal history of other infectious and parasitic diseases: Secondary | ICD-10-CM

## 2018-02-21 DIAGNOSIS — Z823 Family history of stroke: Secondary | ICD-10-CM | POA: Diagnosis not present

## 2018-02-21 DIAGNOSIS — Z87891 Personal history of nicotine dependence: Secondary | ICD-10-CM | POA: Diagnosis not present

## 2018-02-21 DIAGNOSIS — I2510 Atherosclerotic heart disease of native coronary artery without angina pectoris: Secondary | ICD-10-CM | POA: Diagnosis present

## 2018-02-21 DIAGNOSIS — Z90710 Acquired absence of both cervix and uterus: Secondary | ICD-10-CM | POA: Diagnosis not present

## 2018-02-21 DIAGNOSIS — Z7901 Long term (current) use of anticoagulants: Secondary | ICD-10-CM | POA: Diagnosis not present

## 2018-02-21 DIAGNOSIS — R531 Weakness: Secondary | ICD-10-CM

## 2018-02-21 DIAGNOSIS — E876 Hypokalemia: Secondary | ICD-10-CM | POA: Diagnosis present

## 2018-02-21 DIAGNOSIS — L8990 Pressure ulcer of unspecified site, unspecified stage: Secondary | ICD-10-CM

## 2018-02-21 DIAGNOSIS — L89520 Pressure ulcer of left ankle, unstageable: Secondary | ICD-10-CM | POA: Diagnosis present

## 2018-02-21 DIAGNOSIS — Z888 Allergy status to other drugs, medicaments and biological substances status: Secondary | ICD-10-CM | POA: Diagnosis not present

## 2018-02-21 DIAGNOSIS — Z7982 Long term (current) use of aspirin: Secondary | ICD-10-CM

## 2018-02-21 DIAGNOSIS — E039 Hypothyroidism, unspecified: Secondary | ICD-10-CM | POA: Diagnosis present

## 2018-02-21 LAB — URINALYSIS, COMPLETE (UACMP) WITH MICROSCOPIC
Bacteria, UA: NONE SEEN
Bilirubin Urine: NEGATIVE
Glucose, UA: NEGATIVE mg/dL
Hgb urine dipstick: NEGATIVE
KETONES UR: 20 mg/dL — AB
Nitrite: NEGATIVE
PROTEIN: 100 mg/dL — AB
Specific Gravity, Urine: 1.011 (ref 1.005–1.030)
pH: 6 (ref 5.0–8.0)

## 2018-02-21 LAB — CBC WITH DIFFERENTIAL/PLATELET
ABS IMMATURE GRANULOCYTES: 0.12 10*3/uL — AB (ref 0.00–0.07)
BASOS PCT: 0 %
Basophils Absolute: 0.1 10*3/uL (ref 0.0–0.1)
EOS ABS: 0 10*3/uL (ref 0.0–0.5)
Eosinophils Relative: 0 %
HCT: 39.6 % (ref 36.0–46.0)
Hemoglobin: 12.8 g/dL (ref 12.0–15.0)
IMMATURE GRANULOCYTES: 1 %
Lymphocytes Relative: 8 %
Lymphs Abs: 1.6 10*3/uL (ref 0.7–4.0)
MCH: 26.7 pg (ref 26.0–34.0)
MCHC: 32.3 g/dL (ref 30.0–36.0)
MCV: 82.5 fL (ref 80.0–100.0)
MONOS PCT: 5 %
Monocytes Absolute: 1.2 10*3/uL — ABNORMAL HIGH (ref 0.1–1.0)
NEUTROS ABS: 18.6 10*3/uL — AB (ref 1.7–7.7)
NEUTROS PCT: 86 %
NRBC: 0 % (ref 0.0–0.2)
PLATELETS: 481 10*3/uL — AB (ref 150–400)
RBC: 4.8 MIL/uL (ref 3.87–5.11)
RDW: 14.2 % (ref 11.5–15.5)
WBC: 21.6 10*3/uL — ABNORMAL HIGH (ref 4.0–10.5)

## 2018-02-21 LAB — TROPONIN I: Troponin I: 0.03 ng/mL (ref <0.03)

## 2018-02-21 LAB — COMPREHENSIVE METABOLIC PANEL
ALBUMIN: 3.9 g/dL (ref 3.5–5.0)
ALT: 20 U/L (ref 0–44)
AST: 29 U/L (ref 15–41)
Alkaline Phosphatase: 76 U/L (ref 38–126)
Anion gap: 14 (ref 5–15)
BUN: 13 mg/dL (ref 8–23)
CHLORIDE: 100 mmol/L (ref 98–111)
CO2: 23 mmol/L (ref 22–32)
CREATININE: 0.87 mg/dL (ref 0.44–1.00)
Calcium: 9.7 mg/dL (ref 8.9–10.3)
GFR calc Af Amer: >60 mL/min (ref >60)
Glucose, Bld: 148 mg/dL — ABNORMAL HIGH (ref 70–99)
Potassium: 2.7 mmol/L — CL (ref 3.5–5.1)
SODIUM: 137 mmol/L (ref 135–145)
Total Bilirubin: 0.9 mg/dL (ref 0.3–1.2)
Total Protein: 7.2 g/dL (ref 6.5–8.1)

## 2018-02-21 LAB — INFLUENZA PANEL BY PCR (TYPE A & B)
Influenza A By PCR: NEGATIVE
Influenza B By PCR: NEGATIVE

## 2018-02-21 LAB — PROCALCITONIN: Procalcitonin: 0.2 ng/mL

## 2018-02-21 LAB — CG4 I-STAT (LACTIC ACID): Lactic Acid, Venous: 2.38 mmol/L (ref 0.5–1.9)

## 2018-02-21 LAB — MAGNESIUM: Magnesium: 1.6 mg/dL — ABNORMAL LOW (ref 1.7–2.4)

## 2018-02-21 LAB — CK: CK TOTAL: 165 U/L (ref 38–234)

## 2018-02-21 LAB — LACTIC ACID, PLASMA: LACTIC ACID, VENOUS: 1.8 mmol/L (ref 0.5–1.9)

## 2018-02-21 MED ORDER — LEVOTHYROXINE SODIUM 25 MCG PO TABS
25.0000 ug | ORAL_TABLET | Freq: Every day | ORAL | Status: DC
  Administered 2018-02-22 – 2018-02-24 (×2): 25 ug via ORAL
  Filled 2018-02-21 (×2): qty 1

## 2018-02-21 MED ORDER — POTASSIUM CHLORIDE IN NACL 20-0.9 MEQ/L-% IV SOLN
INTRAVENOUS | Status: DC
  Administered 2018-02-21 – 2018-02-24 (×6): via INTRAVENOUS
  Filled 2018-02-21 (×10): qty 1000

## 2018-02-21 MED ORDER — MORPHINE SULFATE (PF) 2 MG/ML IV SOLN
2.0000 mg | Freq: Once | INTRAVENOUS | Status: AC
  Administered 2018-02-21: 2 mg via INTRAVENOUS
  Filled 2018-02-21: qty 1

## 2018-02-21 MED ORDER — CLONIDINE HCL 0.1 MG PO TABS
0.2000 mg | ORAL_TABLET | Freq: Two times a day (BID) | ORAL | Status: DC
  Administered 2018-02-21 – 2018-02-24 (×6): 0.2 mg via ORAL
  Filled 2018-02-21 (×7): qty 2

## 2018-02-21 MED ORDER — SENNOSIDES-DOCUSATE SODIUM 8.6-50 MG PO TABS
2.0000 | ORAL_TABLET | Freq: Every day | ORAL | Status: DC
  Administered 2018-02-21 – 2018-02-23 (×2): 2 via ORAL
  Filled 2018-02-21 (×3): qty 2

## 2018-02-21 MED ORDER — POTASSIUM CHLORIDE CRYS ER 20 MEQ PO TBCR
40.0000 meq | EXTENDED_RELEASE_TABLET | ORAL | Status: AC
  Administered 2018-02-21: 20 meq via ORAL
  Administered 2018-02-21: 17:00:00 40 meq via ORAL
  Filled 2018-02-21 (×3): qty 2

## 2018-02-21 MED ORDER — SIMVASTATIN 20 MG PO TABS
40.0000 mg | ORAL_TABLET | Freq: Every day | ORAL | Status: DC
  Administered 2018-02-21 – 2018-02-23 (×3): 40 mg via ORAL
  Filled 2018-02-21 (×3): qty 2

## 2018-02-21 MED ORDER — AMLODIPINE BESYLATE 10 MG PO TABS
10.0000 mg | ORAL_TABLET | Freq: Every day | ORAL | Status: DC
  Administered 2018-02-21 – 2018-02-24 (×4): 10 mg via ORAL
  Filled 2018-02-21 (×4): qty 1

## 2018-02-21 MED ORDER — SODIUM CHLORIDE 0.9 % IV BOLUS
1000.0000 mL | Freq: Once | INTRAVENOUS | Status: AC
  Administered 2018-02-21: 1000 mL via INTRAVENOUS

## 2018-02-21 MED ORDER — PIPERACILLIN-TAZOBACTAM 3.375 G IVPB 30 MIN
3.3750 g | Freq: Once | INTRAVENOUS | Status: AC
  Administered 2018-02-21: 3.375 g via INTRAVENOUS
  Filled 2018-02-21: qty 50

## 2018-02-21 MED ORDER — VENLAFAXINE HCL ER 75 MG PO CP24
150.0000 mg | ORAL_CAPSULE | Freq: Every day | ORAL | Status: DC
  Administered 2018-02-21 – 2018-02-23 (×2): 150 mg via ORAL
  Filled 2018-02-21 (×3): qty 2

## 2018-02-21 MED ORDER — ONDANSETRON HCL 4 MG/2ML IJ SOLN
4.0000 mg | Freq: Once | INTRAMUSCULAR | Status: AC
  Administered 2018-02-21: 4 mg via INTRAVENOUS
  Filled 2018-02-21: qty 2

## 2018-02-21 MED ORDER — APIXABAN 5 MG PO TABS
5.0000 mg | ORAL_TABLET | Freq: Two times a day (BID) | ORAL | Status: DC
  Administered 2018-02-21 – 2018-02-24 (×6): 5 mg via ORAL
  Filled 2018-02-21 (×7): qty 1

## 2018-02-21 MED ORDER — SODIUM CHLORIDE 0.9 % IV SOLN
1000.0000 mL | Freq: Once | INTRAVENOUS | Status: AC
  Administered 2018-02-21: 1000 mL via INTRAVENOUS

## 2018-02-21 MED ORDER — POTASSIUM CHLORIDE 10 MEQ/100ML IV SOLN
10.0000 meq | Freq: Once | INTRAVENOUS | Status: AC
  Administered 2018-02-21: 18:00:00 10 meq via INTRAVENOUS

## 2018-02-21 MED ORDER — ONDANSETRON HCL 4 MG PO TABS: 4.0000 mg | ORAL_TABLET | Freq: Four times a day (QID) | ORAL | Status: DC | PRN

## 2018-02-21 MED ORDER — OXYCODONE HCL 5 MG PO TABS
5.0000 mg | ORAL_TABLET | Freq: Four times a day (QID) | ORAL | Status: DC | PRN
  Administered 2018-02-21 – 2018-02-24 (×3): 5 mg via ORAL
  Filled 2018-02-21 (×4): qty 1

## 2018-02-21 MED ORDER — ASPIRIN 81 MG PO CHEW
81.0000 mg | CHEWABLE_TABLET | Freq: Every day | ORAL | Status: DC
  Administered 2018-02-21 – 2018-02-24 (×4): 81 mg via ORAL
  Filled 2018-02-21 (×4): qty 1

## 2018-02-21 MED ORDER — ACETAMINOPHEN 325 MG PO TABS: 650.0000 mg | ORAL_TABLET | Freq: Four times a day (QID) | ORAL | Status: DC | PRN

## 2018-02-21 MED ORDER — POTASSIUM CHLORIDE 10 MEQ/100ML IV SOLN
10.0000 meq | INTRAVENOUS | Status: DC
  Administered 2018-02-21 (×2): 10 meq via INTRAVENOUS
  Filled 2018-02-21 (×4): qty 100

## 2018-02-21 MED ORDER — ALBUTEROL SULFATE (2.5 MG/3ML) 0.083% IN NEBU: 2.5000 mg | INHALATION_SOLUTION | RESPIRATORY_TRACT | Status: DC | PRN

## 2018-02-21 MED ORDER — ONDANSETRON HCL 4 MG/2ML IJ SOLN: 4.0000 mg | Freq: Four times a day (QID) | INTRAMUSCULAR | Status: DC | PRN

## 2018-02-21 MED ORDER — PANTOPRAZOLE SODIUM 20 MG PO TBEC
20.0000 mg | DELAYED_RELEASE_TABLET | Freq: Every day | ORAL | Status: DC
  Administered 2018-02-21 – 2018-02-24 (×4): 20 mg via ORAL
  Filled 2018-02-21 (×4): qty 1

## 2018-02-21 MED ORDER — POTASSIUM CHLORIDE 10 MEQ/100ML IV SOLN
10.0000 meq | Freq: Once | INTRAVENOUS | Status: AC
  Administered 2018-02-21: 21:00:00 10 meq via INTRAVENOUS
  Filled 2018-02-21: qty 100

## 2018-02-21 MED ORDER — ACETAMINOPHEN 650 MG RE SUPP: 650.0000 mg | Freq: Four times a day (QID) | RECTAL | Status: DC | PRN

## 2018-02-21 MED ORDER — VANCOMYCIN HCL IN DEXTROSE 1-5 GM/200ML-% IV SOLN
1000.0000 mg | Freq: Once | INTRAVENOUS | Status: AC
  Administered 2018-02-21: 1000 mg via INTRAVENOUS
  Filled 2018-02-21: qty 200

## 2018-02-21 MED ORDER — BISACODYL 5 MG PO TBEC
10.0000 mg | DELAYED_RELEASE_TABLET | Freq: Every day | ORAL | Status: DC
  Administered 2018-02-21 – 2018-02-24 (×4): 10 mg via ORAL
  Filled 2018-02-21 (×4): qty 2

## 2018-02-21 NOTE — Progress Notes (Signed)
Advance care planning  Purpose of Encounter Possible sepsis  Parties in Attendance Patient and son at bedside-[name],Sonya [name]  Patients Decisional capacity Patient is alert and oriented.  Able to make medical decisions. Son, Sonya [name], Sonya [name]-is the documented healthcare power of attorney.  Discussed regarding possible sepsis, dehydration, need for admission, prognosis and treatment plan.  CODE STATUS discussed.  Patient was DO NOT RESUSCITATE in the past.  At this time she wishes to be a full code with full scope of treatment including intubation/CPR/defibrillation.  CODE STATUS changed.  Full code orders entered.  Time spent- 17 minutes

## 2018-02-21 NOTE — H&P (Signed)
[employer] [facility] [place]   PATIENT NAME: Sonya [name]    MR#:  [id number]  DATE OF BIRTH:  10/11/1932  DATE OF ADMISSION:  02/21/2018  PRIMARY CARE PHYSICIAN: Gracelyn Nurse, MD   REQUESTING/REFERRING PHYSICIAN: Dr. Cyril Loosen  CHIEF COMPLAINT:   Chief Complaint  Patient presents with  . Fall    HISTORY OF PRESENT ILLNESS:  Sonya [name]  is a 85 y.o. female with a known history of hypertension, CVA with significant left-sided weakness  presented to the emergency room after she was found on the floor.  Patient slid out of her chair and was unable to get off the floor and laid there all night.  Son brought her to the emergency room.  Husband was at home but could not get her up.  Here patient has been found to have significant leukocytosis and tachycardia with hypokalemia.  No signs of infection on urine or chest x-ray.  Lactic acid elevated.  Patient is being admitted for possible sepsis, hypokalemia and dehydration.  Has not taken her blood pressure medications and blood pressure is elevated at this time.  PAST MEDICAL HISTORY:   Past Medical History:  Diagnosis Date  . CAD (coronary artery disease)   . Carpal tunnel syndrome   . GERD (gastroesophageal reflux disease)   . History of shingles   . Hormone receptor positive breast cancer (HCC)   . Hyperlipemia   . Hypertension   . Stroke [facility])     PAST SURGICAL HISTORY:   Past Surgical History:  Procedure Laterality Date  . ABDOMINAL HYSTERECTOMY    . APPENDECTOMY    . cornoary angioplasty    . ENDOSCOPIC RETROGRADE CHOLANGIOPANCREATOGRAPHY (ERCP) WITH PROPOFOL N/A 11/10/2017   Procedure: ENDOSCOPIC RETROGRADE CHOLANGIOPANCREATOGRAPHY (ERCP) WITH PROPOFOL;  Surgeon: Midge Minium, MD;  Location: [facility] ENDOSCOPY;  Service: Endoscopy;  Laterality: N/A;  . ESOPHAGOGASTRODUODENOSCOPY (EGD) WITH PROPOFOL N/A 11/06/2017   Procedure: ESOPHAGOGASTRODUODENOSCOPY (EGD) WITH PROPOFOL;  Surgeon: Midge Minium,  MD;  Location: [facility] ENDOSCOPY;  Service: Endoscopy;  Laterality: N/A;    SOCIAL HISTORY:   Social History   Tobacco Use  . Smoking status: Former Smoker    Packs/day: 0.50    Years: 20.00    Pack years: 10.00    Types: Cigarettes    Last attempt to quit: 02/17/1998    Years since quitting: 20.0  . Smokeless tobacco: Never Used  Substance Use Topics  . Alcohol use: Not Currently    FAMILY HISTORY:   Family History  Problem Relation Age of Onset  . Hypertension Son   . Ovarian cancer Mother   . Stroke Father   . Stroke Son     DRUG ALLERGIES:   Allergies  Allergen Reactions  . Clopidogrel Nausea And Vomiting     reported by [facility] 11/24/13  . Omeprazole Other (See Comments)    Unknown reaction - reported by [facility] 11/24/13    REVIEW OF SYSTEMS:   Review of Systems  Constitutional: Positive for malaise/fatigue. Negative for chills, fever and weight loss.  HENT: Negative for hearing loss, nosebleeds and sore throat.   Eyes: Negative for blurred vision, double vision and pain.  Respiratory: Negative for cough, hemoptysis, sputum production, shortness of breath and wheezing.   Cardiovascular: Negative for chest pain, palpitations, orthopnea and leg swelling.  Gastrointestinal: Negative for abdominal pain, constipation, diarrhea, heartburn, nausea and vomiting.  Genitourinary: Negative for dysuria and hematuria.  Musculoskeletal: Negative for back pain, falls,  joint pain and myalgias.  Skin: Negative for rash.  Neurological: Positive for focal weakness. Negative for dizziness, tremors, sensory change, speech change, seizures and headaches.  Endo/Heme/Allergies: Does not bruise/bleed easily.  Psychiatric/Behavioral: Negative for depression and memory loss. The patient is not nervous/anxious.     MEDICATIONS AT HOME:   Prior to Admission medications   Medication Sig Start Date End Date Taking? Authorizing Provider   amLODipine (NORVASC) 10 MG tablet Take 1 tablet (10 mg total) by mouth daily. 11/04/17  Yes Salary, Evelena Asa, MD  apixaban (ELIQUIS) 5 MG TABS tablet Two tablets twice a day through 11/17/2017 then one tablet twice a day starting 11/18/2017 11/16/17  Yes Wieting, Richard, MD  aspirin EC 81 MG EC tablet Take 1 tablet (81 mg total) by mouth daily. 11/04/17  Yes Salary, Montell D, MD  b complex vitamins tablet Take 1 tablet by mouth daily.   Yes [provider]  bisacodyl (DUCODYL) 5 MG EC tablet Take 10 mg by mouth daily.   Yes [provider]  Cholecalciferol (VITAMIN D3) 1000 units CAPS Take 3,000 Units by mouth at bedtime.  09/07/17  Yes [provider]  cloNIDine (CATAPRES) 0.2 MG tablet Take 1 tablet (0.2 mg total) by mouth 2 (two) times daily. 10/15/17  Yes Emokpae, Courage, MD  lansoprazole (PREVACID) 15 MG capsule Take 15 mg by mouth at bedtime.    Yes [provider]  levothyroxine (SYNTHROID, LEVOTHROID) 25 MCG tablet Take 25 mcg by mouth daily before breakfast.  09/10/17  Yes [provider]  Melatonin 3 MG TABS Take 3 mg by mouth at bedtime as needed.   Yes [provider]  ondansetron (ZOFRAN-ODT) 4 MG disintegrating tablet Take 4 mg by mouth every 8 (eight) hours as needed for nausea or vomiting.   Yes [provider]  oxyCODONE (OXY IR/ROXICODONE) 5 MG immediate release tablet Take 1 tablet (5 mg total) by mouth every 6 (six) hours as needed for severe pain. 11/16/17  Yes Wieting, Richard, MD  senna-docusate (SENOKOT-S) 8.6-50 MG tablet Take 2 tablets by mouth at bedtime. 10/15/17  Yes Shon Hale, MD  simvastatin (ZOCOR) 40 MG tablet Take 1 tablet (40 mg total) by mouth daily at 6 PM. 10/15/17  Yes Emokpae, Courage, MD  venlafaxine XR (EFFEXOR-XR) 150 MG 24 hr capsule Take 150 mg by mouth at bedtime. 09/10/17  Yes [provider]  acetaminophen (TYLENOL) 325 MG tablet Take 2 tablets (650 mg total) by mouth every 4 (four)  hours as needed for mild pain (or temp > 37.5 C (99.5 F)). Patient not taking: Reported on 02/21/2018 10/15/17   Shon Hale, MD  famotidine (PEPCID) 20 MG tablet Take 1 tablet (20 mg total) by mouth 2 (two) times daily. Patient not taking: Reported on 02/21/2018 11/16/17   Alford Highland, MD  magic mouthwash SOLN Take 5 mLs by mouth 3 (three) times daily. Swish and spit    [provider]  nystatin (MYCOSTATIN) 100000 UNIT/ML suspension Take 5 mLs by mouth 4 (four) times daily.    [provider]  polyethylene glycol (MIRALAX / GLYCOLAX) packet Take 17 g by mouth daily as needed for moderate constipation. 11/16/17   Alford Highland, MD     VITAL SIGNS:  Blood pressure (!) 197/87, pulse (!) 121, temperature 97.6 F (36.4 C), temperature source Axillary, resp. rate (!) 29, height 5\' 2"  (1.575 m), weight 58.5 kg, SpO2 99 %.  PHYSICAL EXAMINATION:  Physical Exam  GENERAL:  85 y.o.-year-old patient  lying in the bed with no acute distress.  EYES: Pupils equal, round, reactive to light and accommodation. No scleral icterus. Extraocular muscles intact.  HEENT: Head atraumatic, normocephalic. Oropharynx and nasopharynx clear. No oropharyngeal erythema, dry oral mucosa  NECK:  Supple, no jugular venous distention. No thyroid enlargement, no tenderness.  LUNGS: Normal breath sounds bilaterally, no wheezing, rales, rhonchi. No use of accessory muscles of respiration.  CARDIOVASCULAR: S1, S2 normal. No murmurs, rubs, or gallops.  ABDOMEN: Soft, nontender, nondistended. Bowel sounds present. No organomegaly or mass.  EXTREMITIES: No pedal edema, cyanosis, or clubbing. + 2 pedal & radial pulses b/l.   NEUROLOGIC: Cranial nerves II through XII are intact.  Sensations intact all over.  Left upper extremity 1/5.  Left lower extremity 4-/5.  Right upper and lower extremity 5/5 PSYCHIATRIC: The patient is alert and oriented x 3. Good affect.  SKIN: No obvious rash, lesion, or ulcer.    LABORATORY PANEL:   CBC Recent Labs  Lab 02/21/18 0854  WBC 21.6*  HGB 12.8  HCT 39.6  PLT 481*   ------------------------------------------------------------------------------------------------------------------  Chemistries  Recent Labs  Lab 02/21/18 0854  NA 137  K 2.7*  CL 100  CO2 23  GLUCOSE 148*  BUN 13  CREATININE 0.87  CALCIUM 9.7  AST 29  ALT 20  ALKPHOS 76  BILITOT 0.9   ------------------------------------------------------------------------------------------------------------------  Cardiac Enzymes Recent Labs  Lab 02/21/18 0854  TROPONINI 0.03*   ------------------------------------------------------------------------------------------------------------------  RADIOLOGY:  Dg Pelvis 1-2 Views  Result Date: 02/21/2018 CLINICAL DATA:  Fall.  Larey Seat out of chair. EXAM: PELVIS - 1-2 VIEW COMPARISON:  CT AP 10/30/2017 FINDINGS: Both hips appear located. No fractures or dislocations identified. Spondylosis within the lower lumbar spine. IMPRESSION: No acute findings identified. Electronically Signed   By: Signa Kell M.D.   On: 02/21/2018 10:02   Ct Head Wo Contrast  Result Date: 02/21/2018 CLINICAL DATA:  Altered level of consciousness. EXAM: CT HEAD WITHOUT CONTRAST TECHNIQUE: Contiguous axial images were obtained from the base of the skull through the vertex without intravenous contrast. COMPARISON:  None. FINDINGS: Brain: No evidence of acute infarction, hemorrhage, hydrocephalus, extra-axial collection or mass lesion/mass effect. Large area of encephalomalacia within the right parietal lobe is identified compatible with chronic right MCA infarct. There is mild diffuse low-attenuation within the subcortical and periventricular white matter compatible with chronic microvascular disease. Prominence of the sulci and ventricles identified compatible with brain atrophy. Vascular: No hyperdense vessel or unexpected calcification. Skull: Normal. Negative for  fracture or focal lesion. Sinuses/Orbits: No acute finding. Other: None IMPRESSION: 1. No acute intracranial abnormalities. 2. Chronic right MCA infarct, small vessel ischemic change, and brain atrophy. Electronically Signed   By: Signa Kell M.D.   On: 02/21/2018 11:18   Dg Chest Port 1 View  Result Date: 02/21/2018 CLINICAL DATA:  Possible stroke last night. EXAM: PORTABLE CHEST 1 VIEW COMPARISON:  10/30/2017 FINDINGS: Lungs are adequately inflated as patient is slightly rotated to the left. No focal airspace consolidation or effusion. Cardiomediastinal silhouette and remainder of the exam is unchanged. IMPRESSION: No active disease. Electronically Signed   By: Elberta Fortis M.D.   On: 02/21/2018 09:42     IMPRESSION AND PLAN:   * SIRS  No signs of infection. Blood cultures drawn in the emergency room. Vancomycin and Zosyn given.  Will hold IV antibiotics.  Check procalcitonin level.  Wait for culture results. Patient has leukocytosis and tachycardia which could also be due to dehydration.  *Severe hypokalemia.  Cardiac monitoring.  Replaced through oral and IV.  *Leukocytosis.  Etiology unclear.  Could be hemoconcentration.  Work-up underway for infection.  Will repeat CBC in the morning.  *History of CVA.  Continue patient's home medications.  *Hypertensive urgency.  Restart patient's clonidine and amlodipine from home.  Hydralazine IV as needed  DVT prophylaxis.  Patient is on Eliquis.  All the records are reviewed and case discussed with ED provider. Management plans discussed with the patient, family and they are in agreement.  CODE STATUS: Full code  TOTAL TIME TAKING CARE OF THIS PATIENT: 40 minutes.   Molinda Bailiff Jouri Threat M.D on 02/21/2018 at 12:02 PM  Between 7am to 6pm - Pager - [phone]  After 6pm go to [email or web address] - password [email or web address] [facility]  [facility]  [place] [facility]  Office  [phone]  CC: Primary care physician; Gracelyn Nurse, MD  Note: This dictation  was prepared with Dragon dictation along with smaller phrase technology. Any transcriptional errors that result from this process are unintentional.

## 2018-02-21 NOTE — ED Notes (Signed)
Family at bedside at this time with more information. Pt lives with husband who states last night at midnight pt's right arm and eyes began to twitch. Pt then slipped out of chair. Pt remained on the floor until son arrives at 15. Pt at baseline is alert and oriented x 4 but due to stroke last year has had more mobility of her left side.

## 2018-02-21 NOTE — ED Notes (Signed)
Pt given 20 mEq of Potassium. Pt unable to tolerate additional tablet.

## 2018-02-21 NOTE — ED Triage Notes (Addendum)
Pt arrived via ems from home. Pt lives with her husband who reported to EMS pt was last known well at 85 yesterday. Pt's husband reports to ems that pt was not at her baseline after 1800 yesterday but unclear of what alteration from baseline were. Pt fell at midnight last night and remained on the floor until this morning. No family at bedside right now. Pt arrives to ED alert and when asked why she is here pt states "a stroke." pt reports back pain at this time. Pt is oriented to person and place.

## 2018-02-21 NOTE — ED Provider Notes (Signed)
[facility] Emergency Department Provider Note   ____________________________________________    I have reviewed the triage vital signs and the nursing notes.   HISTORY  Chief Complaint Fall     HPI Alaura Schippers is a 85 y.o. female with a history of CVA leaving her with left-sided weakness who presents after reported fall.  Apparently the patient fell out of her chair around midnight and was unable to get off the floor.  She denies significant injury.  No reports of fevers, does complain of diffuse weakness.  Denies new focal weakness or bony injury.   Past Medical History:  Diagnosis Date  . CAD (coronary artery disease)   . Carpal tunnel syndrome   . GERD (gastroesophageal reflux disease)   . History of shingles   . Hormone receptor positive breast cancer ([place])   . Hyperlipemia   . Hypertension   . Stroke [facility])     Patient Active Problem List   Diagnosis Date Noted  . Calculus of common duct without obstruction   . Abnormal findings on diagnostic imaging of liver   . Problems with swallowing and mastication   . Acute gastritis without hemorrhage   . Stricture and stenosis of esophagus   . AKI (acute kidney injury) ([place]) 11/05/2017  . Nausea & vomiting 11/05/2017  . Choledocholithiasis   . Dysphasia   . Abdominal pain 10/30/2017  . Cerebral embolism with cerebral infarction 10/13/2017  . CVA (cerebral vascular accident) ([place]) 10/13/2017  . Hypertension 10/12/2017  . Hypertensive urgency 10/12/2017  . Hypothyroidism 10/12/2017  . Left-sided weakness 10/12/2017  . Elevated troponin 10/12/2017  . Unknown when suspected stroke patient was last well 10/12/2017  . History of depression 12/05/2016  . Hypothyroidism due to acquired atrophy of thyroid 06/05/2014  . Chronic renal insufficiency 11/28/2013  . GERD (gastroesophageal reflux disease) 11/28/2013  . H/O vitamin D deficiency 11/28/2013  . History of ischemic heart disease  11/28/2013  . Hyperlipidemia 11/28/2013    Past Surgical History:  Procedure Laterality Date  . ABDOMINAL HYSTERECTOMY    . APPENDECTOMY    . cornoary angioplasty    . ENDOSCOPIC RETROGRADE CHOLANGIOPANCREATOGRAPHY (ERCP) WITH PROPOFOL N/A 11/10/2017   Procedure: ENDOSCOPIC RETROGRADE CHOLANGIOPANCREATOGRAPHY (ERCP) WITH PROPOFOL;  Surgeon: Lucilla Lame, MD;  Location: [facility] ENDOSCOPY;  Service: Endoscopy;  Laterality: N/A;  . ESOPHAGOGASTRODUODENOSCOPY (EGD) WITH PROPOFOL N/A 11/06/2017   Procedure: ESOPHAGOGASTRODUODENOSCOPY (EGD) WITH PROPOFOL;  Surgeon: Lucilla Lame, MD;  Location: [facility] ENDOSCOPY;  Service: Endoscopy;  Laterality: N/A;    Prior to Admission medications   Medication Sig Start Date End Date Taking? Authorizing Provider  amLODipine (NORVASC) 10 MG tablet Take 1 tablet (10 mg total) by mouth daily. 11/04/17  Yes Salary, Avel Peace, MD  apixaban (ELIQUIS) 5 MG TABS tablet Two tablets twice a day through 11/17/2017 then one tablet twice a day starting 11/18/2017 11/16/17  Yes Wieting, Richard, MD  aspirin EC 81 MG EC tablet Take 1 tablet (81 mg total) by mouth daily. 11/04/17  Yes Salary, Montell D, MD  b complex vitamins tablet Take 1 tablet by mouth daily.   Yes [provider]  bisacodyl (DUCODYL) 5 MG EC tablet Take 10 mg by mouth daily.   Yes [provider]  Cholecalciferol (VITAMIN D3) 1000 units CAPS Take 3,000 Units by mouth at bedtime.  09/07/17  Yes [provider]  cloNIDine (CATAPRES) 0.2 MG tablet Take 1 tablet (0.2 mg total) by mouth 2 (two) times daily. 10/15/17  Yes Emokpae,  Courage, MD  lansoprazole (PREVACID) 15 MG capsule Take 15 mg by mouth at bedtime.    Yes [provider]  levothyroxine (SYNTHROID, LEVOTHROID) 25 MCG tablet Take 25 mcg by mouth daily before breakfast.  09/10/17  Yes [provider]  Melatonin 3 MG TABS Take 3 mg by mouth at bedtime as needed.   Yes [provider]  ondansetron (ZOFRAN-ODT) 4  MG disintegrating tablet Take 4 mg by mouth every 8 (eight) hours as needed for nausea or vomiting.   Yes [provider]  oxyCODONE (OXY IR/ROXICODONE) 5 MG immediate release tablet Take 1 tablet (5 mg total) by mouth every 6 (six) hours as needed for severe pain. 11/16/17  Yes Wieting, Richard, MD  senna-docusate (SENOKOT-S) 8.6-50 MG tablet Take 2 tablets by mouth at bedtime. 10/15/17  Yes Roxan Hockey, MD  simvastatin (ZOCOR) 40 MG tablet Take 1 tablet (40 mg total) by mouth daily at 6 PM. 10/15/17  Yes Emokpae, Courage, MD  venlafaxine XR (EFFEXOR-XR) 150 MG 24 hr capsule Take 150 mg by mouth at bedtime. 09/10/17  Yes [provider]  acetaminophen (TYLENOL) 325 MG tablet Take 2 tablets (650 mg total) by mouth every 4 (four) hours as needed for mild pain (or temp > 37.5 C (99.5 F)). Patient not taking: Reported on 02/21/2018 10/15/17   Roxan Hockey, MD  famotidine (PEPCID) 20 MG tablet Take 1 tablet (20 mg total) by mouth 2 (two) times daily. Patient not taking: Reported on 02/21/2018 11/16/17   Loletha Grayer, MD  magic mouthwash SOLN Take 5 mLs by mouth 3 (three) times daily. Swish and spit    [provider]  nystatin (MYCOSTATIN) 100000 UNIT/ML suspension Take 5 mLs by mouth 4 (four) times daily.    [provider]  polyethylene glycol (MIRALAX / GLYCOLAX) packet Take 17 g by mouth daily as needed for moderate constipation. 11/16/17   Loletha Grayer, MD     Allergies Clopidogrel and Omeprazole  Family History  Problem Relation Age of Onset  . Hypertension Son   . Ovarian cancer Mother   . Stroke Father   . Stroke Son     Social History Social History   Tobacco Use  . Smoking status: Former Smoker    Packs/day: 0.50    Years: 20.00    Pack years: 10.00    Types: Cigarettes    Last attempt to quit: 02/17/1998    Years since quitting: 20.0  . Smokeless tobacco: Never Used  Substance Use Topics  . Alcohol use: Not Currently  . Drug use:  Never    Review of Systems  Constitutional: Has not felt feverish Eyes: No visual changes.  ENT: No sore throat. Cardiovascular: Denies chest pain. Respiratory: Denies shortness of breath. Gastrointestinal: No abdominal pain.    Genitourinary: Positive frequency Musculoskeletal: Negative for back pain. Skin: Negative for rash. Neurological: Negative for headaches   ____________________________________________   PHYSICAL EXAM:  VITAL SIGNS: ED Triage Vitals  Enc Vitals Group     BP 02/21/18 0851 (!) 202/94     Pulse Rate 02/21/18 0851 (!) 133     Resp 02/21/18 0930 17     Temp 02/21/18 0851 97.6 F (36.4 C)     Temp Source 02/21/18 0851 Axillary     SpO2 02/21/18 0851 100 %     Weight 02/21/18 0851 58.5 kg (129 lb)     Height 02/21/18 0851 1.575 m (5\' 2" )     Head Circumference --  Peak Flow --      Pain Score 02/21/18 1022 Asleep     Pain Loc --      Pain Edu? --      Excl. in [place]? --     Constitutional: Alert  Eyes: Conjunctivae are normal.  Nose: No swelling or epistaxis Mouth/Throat: Mucous membranes are moist.   Neck: Mild vertebral tenderness palpation Cardiovascular: Tachycardia regular rhythm. Grossly normal heart sounds.  Good peripheral circulation. Respiratory: Normal respiratory effort.  No retractions. Lungs CTAB. Gastrointestinal: Soft and nontender. No distention.  No CVA tenderness. d Musculoskeletal:  Warm and well perfused Neurologic:  Normal speech and language. No gross focal neurologic deficits are appreciated.  Skin:  Skin is warm, dry and intact. No rash noted. Psychiatric: Mood and affect are normal. Speech and behavior are normal.  ____________________________________________   LABS (all labs ordered are listed, but only abnormal results are displayed)  Labs Reviewed  URINALYSIS, COMPLETE (UACMP) WITH MICROSCOPIC - Abnormal; Notable for the following components:      Result Value   Color, Urine YELLOW (*)    APPearance HAZY  (*)    Ketones, ur 20 (*)    Protein, ur 100 (*)    Leukocytes, UA TRACE (*)    All other components within normal limits  CBC WITH DIFFERENTIAL/PLATELET - Abnormal; Notable for the following components:   WBC 21.6 (*)    Platelets 481 (*)    Neutro Abs 18.6 (*)    Monocytes Absolute 1.2 (*)    Abs Immature Granulocytes 0.12 (*)    All other components within normal limits  COMPREHENSIVE METABOLIC PANEL - Abnormal; Notable for the following components:   Potassium 2.7 (*)    Glucose, Bld 148 (*)    All other components within normal limits  TROPONIN I - Abnormal; Notable for the following components:   Troponin I 0.03 (*)    All other components within normal limits  CG4 I-STAT (LACTIC ACID) - Abnormal; Notable for the following components:   Lactic Acid, Venous 2.38 (*)    All other components within normal limits  URINE CULTURE  CULTURE, BLOOD (ROUTINE X 2)  CULTURE, BLOOD (ROUTINE X 2)  CK  LACTIC ACID, PLASMA  INFLUENZA PANEL BY PCR (TYPE A & B)   ____________________________________________  EKG  ED ECG REPORT I, Lavonia Drafts, the attending physician, personally viewed and interpreted this ECG.  Date: 02/21/2018  Rhythm: Sinus tachycardia QRS Axis: normal Intervals: normal ST/T Wave abnormalities: normal Narrative Interpretation: no evidence of acute ischemia  ____________________________________________  RADIOLOGY  Chest x-ray no pneumonia CT head negative ____________________________________________   PROCEDURES  Procedure(s) performed: No  Procedures   Critical Care performed: No ____________________________________________   INITIAL IMPRESSION / ASSESSMENT AND PLAN / ED COURSE  Pertinent labs & imaging results that were available during my care of the patient were reviewed by me and considered in my medical decision making (see chart for details).  Patient presents after fall from seated position, unable to stand up.  She is significantly  tachycardic labs are significant for white blood cell count of 21,000, elevated lactic acid.  Chest x-ray negative for pneumonia, urinalysis overall unremarkable, blood culture sent.  IV fluids given.  Will cover with broad-spectrum antibiotics although no clear source of infection, doubt sepsis.  Have admitted to the hospital service for further evaluation    ____________________________________________   FINAL CLINICAL IMPRESSION(S) / ED DIAGNOSES  Final diagnoses:  Dehydration  Weakness  Note:  This document was prepared using Dragon voice recognition software and may include unintentional dictation errors.   Lavonia Drafts, MD 02/21/18 1200

## 2018-02-22 DIAGNOSIS — L8990 Pressure ulcer of unspecified site, unspecified stage: Secondary | ICD-10-CM

## 2018-02-22 DIAGNOSIS — L89520 Pressure ulcer of left ankle, unstageable: Secondary | ICD-10-CM | POA: Diagnosis present

## 2018-02-22 LAB — BLOOD CULTURE ID PANEL (REFLEXED)
Acinetobacter baumannii: NOT DETECTED
Candida albicans: NOT DETECTED
Candida glabrata: NOT DETECTED
Candida krusei: NOT DETECTED
Candida parapsilosis: NOT DETECTED
Candida tropicalis: NOT DETECTED
Enterobacter cloacae complex: NOT DETECTED
Enterobacteriaceae species: NOT DETECTED
Enterococcus species: NOT DETECTED
Escherichia coli: NOT DETECTED
Haemophilus influenzae: NOT DETECTED
Klebsiella oxytoca: NOT DETECTED
Klebsiella pneumoniae: NOT DETECTED
Listeria monocytogenes: NOT DETECTED
Methicillin resistance: DETECTED — AB
NEISSERIA MENINGITIDIS: NOT DETECTED
Proteus species: NOT DETECTED
Pseudomonas aeruginosa: NOT DETECTED
STAPHYLOCOCCUS SPECIES: DETECTED — AB
STREPTOCOCCUS SPECIES: NOT DETECTED
Serratia marcescens: NOT DETECTED
Staphylococcus aureus (BCID): NOT DETECTED
Streptococcus agalactiae: NOT DETECTED
Streptococcus pneumoniae: NOT DETECTED
Streptococcus pyogenes: NOT DETECTED

## 2018-02-22 LAB — URINE CULTURE: Culture: NO GROWTH

## 2018-02-22 LAB — BASIC METABOLIC PANEL
Anion gap: 9 (ref 5–15)
BUN: <5 mg/dL — ABNORMAL LOW (ref 8–23)
CALCIUM: 8.2 mg/dL — AB (ref 8.9–10.3)
CO2: 24 mmol/L (ref 22–32)
Chloride: 107 mmol/L (ref 98–111)
Creatinine, Ser: 0.55 mg/dL (ref 0.44–1.00)
GFR calc Af Amer: >60 mL/min (ref >60)
GFR calc non Af Amer: >60 mL/min (ref >60)
Glucose, Bld: 82 mg/dL (ref 70–99)
Potassium: 3.6 mmol/L (ref 3.5–5.1)
Sodium: 140 mmol/L (ref 135–145)

## 2018-02-22 LAB — MRSA PCR SCREENING: MRSA by PCR: POSITIVE — AB

## 2018-02-22 LAB — CBC
HCT: 35.3 % — ABNORMAL LOW (ref 36.0–46.0)
Hemoglobin: 11.1 g/dL — ABNORMAL LOW (ref 12.0–15.0)
MCH: 26.4 pg (ref 26.0–34.0)
MCHC: 31.4 g/dL (ref 30.0–36.0)
MCV: 84 fL (ref 80.0–100.0)
Platelets: 344 10*3/uL (ref 150–400)
RBC: 4.2 MIL/uL (ref 3.87–5.11)
RDW: 14.3 % (ref 11.5–15.5)
WBC: 11.6 10*3/uL — ABNORMAL HIGH (ref 4.0–10.5)
nRBC: 0 % (ref 0.0–0.2)

## 2018-02-22 MED ORDER — HYDROCHLOROTHIAZIDE 12.5 MG PO CAPS
12.5000 mg | ORAL_CAPSULE | Freq: Every day | ORAL | Status: DC
  Administered 2018-02-22 – 2018-02-24 (×3): 12.5 mg via ORAL
  Filled 2018-02-22 (×3): qty 1

## 2018-02-22 MED ORDER — VANCOMYCIN HCL 500 MG IV SOLR
500.0000 mg | INTRAVENOUS | Status: DC
  Administered 2018-02-22 – 2018-02-23 (×2): 500 mg via INTRAVENOUS
  Filled 2018-02-22 (×3): qty 500

## 2018-02-22 NOTE — Consult Note (Signed)
[facility] Nurse wound consult note Reason for Consult: unstageable pressure injury left ankle Patient self reports she got pressure injury while inpatient at a SNF.  Wound type: unstageable pressure injury left lateral ankle  Pressure Injury POA: Yes Measurement: 0.3cm x 0.4cm x 0cm  Wound bed: 90% necrotic but superficial  Drainage (amount, consistency, odor) minimal, no odor Periwound: intact, palpable pulses  Dressing procedure/placement/frequency: Continue silicone foam, change every 3 days  Prevalon boots added bilaterally and explained rationale for use. Demonstrated application bilaterally to patient and the patient's family.   Discussed POC with patient and bedside nurse.  Re consult if needed, will not follow at this time. Thanks  Mckay Brandt [employer], RN,CWOCN, CNS, [place] [phone])

## 2018-02-22 NOTE — Progress Notes (Signed)
[employer] - [place] at [facility]   PATIENT NAME: Sonya [name]    MR#:  [id number]  DATE OF BIRTH:  1932/03/31  SUBJECTIVE:  CHIEF COMPLAINT:   Chief Complaint  Patient presents with  . Fall    No new complaint this morning.  No fevers overnight.  Resting comfortably in bed.  REVIEW OF SYSTEMS:  Review of Systems  Constitutional: Negative for chills and fever.  HENT: Negative for hearing loss and tinnitus.   Eyes: Negative for blurred vision and double vision.  Respiratory: Negative for cough and hemoptysis.   Cardiovascular: Negative for chest pain and palpitations.  Gastrointestinal: Negative for heartburn, nausea and vomiting.  Genitourinary: Negative for dysuria and urgency.  Musculoskeletal: Negative for neck pain.       Generalized weakness  Skin: Negative for itching and rash.  Neurological: Negative for dizziness and headaches.  Psychiatric/Behavioral: Positive for depression. Negative for hallucinations.   .ros DRUG ALLERGIES:   Allergies  Allergen Reactions  . Clopidogrel Nausea And Vomiting     reported by [facility] 11/24/13  . Omeprazole Other (See Comments)    Unknown reaction - reported by [facility] 11/24/13   VITALS:  Blood pressure (!) 161/85, pulse (!) 115, temperature 98.4 F (36.9 C), temperature source Oral, resp. rate 18, height 5' (1.524 m), weight 43.5 kg, SpO2 96 %. PHYSICAL EXAMINATION:    Physical Exam  Constitutional: She is oriented to person, place, and time. She appears well-developed and well-nourished.  HENT:  Head: Normocephalic and atraumatic.  Eyes: Pupils are equal, round, and reactive to light. Conjunctivae are normal.  Neck: Normal range of motion. Neck supple.  Cardiovascular: Normal rate and regular rhythm.  Respiratory: Effort normal.  GI: Soft. Bowel sounds are normal.  Musculoskeletal:        General: No tenderness or edema.     Comments: Generalized  weakness with more weakness on the left due to history of prior CVA  Neurological: She is alert and oriented to person, place, and time.  Skin: Skin is warm and dry.   LABORATORY PANEL:  Female CBC Recent Labs  Lab 02/22/18 0410  WBC 11.6*  HGB 11.1*  HCT 35.3*  PLT 344   ------------------------------------------------------------------------------------------------------------------ Chemistries  Recent Labs  Lab 02/21/18 0854 02/22/18 0410  NA 137 140  K 2.7* 3.6  CL 100 107  CO2 23 24  GLUCOSE 148* 82  BUN 13 <5*  CREATININE 0.87 0.55  CALCIUM 9.7 8.2*  MG 1.6*  --   AST 29  --   ALT 20  --   ALKPHOS 76  --   BILITOT 0.9  --    RADIOLOGY:  No results found. ASSESSMENT AND PLAN:     1.Sepsis secondary to bacteremia  Present on admission Patient with evidence of leukocytosis with white count of 21,000.  Was tachycardic.  Leukocytosis improving.  Blood culture with evidence of gram-positive cocci bacteremia. Patient given a dose of vancomycin and Zosyn in the emergency room. Placed on IV vancomycin with pharmacy to assist with renal dosing.  Continue IV fluid hydration.  2.Severe hypokalemia. Replaced.  Follow-up on BMP and magnesium level in a.m..  3.History of CVA.   Patient noted to be on aspirin , Eliquis and statins prior to presentation.  These were continued. Patient not sure why she was placed on Eliquis .  Suspect underlying history of A. Fib.  4. Hypertensive urgency.   Home blood pressure medications including clonidine  and Norvasc already resumed.   Blood pressure not optimally controlled.  Initiated hydrochlorothiazide 12.5 mg p.o. daily.  Monitor and adjust meds as needed  5.  Fall prior to presentation CT scan of the head done without contrast negative for any acute findings Physical therapy consult placed to evaluate and treat.  6.  Left ankle pressure injury; unstageable Wound care nurse consult placed.  Follow-up on  recommendations  DVT prophylaxis.  Patient is on Eliquis.  CODE STATUS: Full code   All the records are reviewed and case discussed with Care Management/Social Worker. Management plans discussed with the patient, family and they are in agreement.  CODE STATUS: Full Code  TOTAL TIME TAKING CARE OF THIS PATIENT: 35 minutes.   More than 50% of the time was spent in counseling/coordination of care: YES  POSSIBLE D/C IN 2 LL DAYS, DEPENDING ON CLINICAL CONDITION.   [name] M.D on 02/22/2018 at 12:46 PM  Between 7am to 6pm - Pager - [phone]  After 6pm go to [email or web address] - Social research officer, government  [employer]  Hospitalists  Office  [phone]  CC: Primary care physician; Gracelyn Nurse, MD  Note: This dictation was prepared with Dragon dictation along with smaller phrase technology. Any transcriptional errors that result from this process are unintentional.

## 2018-02-22 NOTE — Progress Notes (Signed)
PHARMACY - PHYSICIAN COMMUNICATION CRITICAL VALUE ALERT - BLOOD CULTURE IDENTIFICATION (BCID)  Sonya [name] is an 85 y.o. female who presented to [facility] on 02/21/2018 with a chief complaint of fall  Assessment:  Tachycardic, hypertensive, PCT 0.2, CXR negative, UA negative, 1/4 anaerobic GPC BCID staph Mec A+  Name of physician (or Provider) Contacted: Arta Silence  Current antibiotics: none  Changes to prescribed antibiotics recommended:  Provider would like to continue observing as patient is not showing clear cut signs of infection  Results for orders placed or performed during the hospital encounter of 02/21/18  Blood Culture ID Panel (Reflexed) (Collected: 02/21/2018  8:54 AM)  Result Value Ref Range   Enterococcus species NOT DETECTED NOT DETECTED   Listeria monocytogenes NOT DETECTED NOT DETECTED   Staphylococcus species DETECTED (A) NOT DETECTED   Staphylococcus aureus (BCID) NOT DETECTED NOT DETECTED   Methicillin resistance DETECTED (A) NOT DETECTED   Streptococcus species NOT DETECTED NOT DETECTED   Streptococcus agalactiae NOT DETECTED NOT DETECTED   Streptococcus pneumoniae NOT DETECTED NOT DETECTED   Streptococcus pyogenes NOT DETECTED NOT DETECTED   Acinetobacter baumannii NOT DETECTED NOT DETECTED   Enterobacteriaceae species NOT DETECTED NOT DETECTED   Enterobacter cloacae complex NOT DETECTED NOT DETECTED   Escherichia coli NOT DETECTED NOT DETECTED   Klebsiella oxytoca NOT DETECTED NOT DETECTED   Klebsiella pneumoniae NOT DETECTED NOT DETECTED   Proteus species NOT DETECTED NOT DETECTED   Serratia marcescens NOT DETECTED NOT DETECTED   Haemophilus influenzae NOT DETECTED NOT DETECTED   Neisseria meningitidis NOT DETECTED NOT DETECTED   Pseudomonas aeruginosa NOT DETECTED NOT DETECTED   Candida albicans NOT DETECTED NOT DETECTED   Candida glabrata NOT DETECTED NOT DETECTED   Candida krusei NOT DETECTED NOT DETECTED   Candida parapsilosis NOT  DETECTED NOT DETECTED   Candida tropicalis NOT DETECTED NOT DETECTED   Tobie Lords, PharmD, BCPS Clinical Pharmacist 02/22/2018

## 2018-02-22 NOTE — Evaluation (Signed)
Physical Therapy Evaluation Patient Details Name: Sonya [name] MRN: [id number] DOB: May 29, 1932 Today's Date: 02/22/2018   History of Present Illness  Pt is an 85 y.o. female with a known history of hypertension, CVA with significant left-sided weakness  presented to the emergency room after she was found on the floor.  Patient slid out of her chair and was unable to get off the floor and laid there all night.  Son brought her to the emergency room.  Husband was at home but could not get her up.  Here patient has been found to have significant leukocytosis and tachycardia with hypokalemia.  No signs of infection on urine or chest x-ray.  Assessment includes: sepsis, hypokalemia, HTN, and L ankle pressure injury.    Clinical Impression  Pt presents with deficits in strength, transfers, mobility, gait, balance, and activity tolerance as well as ataxia to the LLE.  Pt required extensive assistance with bed mobility and transfers and required frequent physical assistance to prevent posterior LOB while in sitting.  In standing pt was ataxic with the LLE and required constant physical assistance to prevent LOB.  Pt unable to safely attempt to take a step with a HW.  Pt had a CVA in late September of 2019 and recently returned home after prolonged stay at a SNF.  Pt was receiving HHPT prior to this admission.  Per pt and son pt does appear to be functionally the same at this point compared to when she returned home from her stay at [facility].  Pt/son feel comfortable with pt returning home with HHPT once pt is medically approved for discharge.       Follow Up Recommendations Home health PT;Supervision for mobility/OOB    Equipment Recommendations  None recommended by PT    Recommendations for Other Services       Precautions / Restrictions Precautions Precautions: Fall Restrictions Weight Bearing Restrictions: No      Mobility  Bed Mobility Overal bed mobility: Needs Assistance Bed Mobility:  Supine to Sit;Sit to Supine     Supine to sit: Max assist Sit to supine: Max assist   General bed mobility comments: Max A for BLEs in and out of bed and for trunk to full upright position  Transfers Overall transfer level: Needs assistance Equipment used: Hemi-walker Transfers: Sit to/from Stand Sit to Stand: Max assist;Mod assist         General transfer comment: Max verbal and tactile cues for increased trunk flex and for general sequencing  Ambulation/Gait             General Gait Details: Unable  Stairs            Wheelchair Mobility    Modified Rankin (Stroke Patients Only)       Balance Overall balance assessment: Needs assistance Sitting-balance support: Single extremity supported;Feet supported Sitting balance-Leahy Scale: Poor   Postural control: Posterior lean Standing balance support: Single extremity supported Standing balance-Leahy Scale: Poor                               Pertinent Vitals/Pain Pain Assessment: No/denies pain    Home Living Family/patient expects to be discharged to:: Private residence Living Arrangements: Spouse/significant other Available Help at Discharge: Family;Available 24 hours/day(Son available to help as needed) Type of Home: House Home Access: Ramped entrance     Home Layout: One level Home Equipment: Wheelchair - manual;Bedside commode;Cane - single point;Hospital bed  Prior Function Level of Independence: Needs assistance   Gait / Transfers Assistance Needed: Son/spouse assist with bed mobility and transfers, pt non-ambulatory; recent fall with pt sliding out of recliner, no other recent falls  ADL's / Homemaking Assistance Needed: Son/spouse assist with all ADLs        Hand Dominance   Dominant Hand: Right    Extremity/Trunk Assessment   Upper Extremity Assessment Upper Extremity Assessment: LUE deficits/detail LUE Deficits / Details: Flaccid LUE    Lower Extremity  Assessment Lower Extremity Assessment: LLE deficits/detail LLE Deficits / Details: Voluntary minimal movement with ankle PF/DF, hip flex, and knee flex/ext but attaxic with strength not functional  LLE Sensation: WNL       Communication   Communication: No difficulties  Cognition Arousal/Alertness: Awake/alert Behavior During Therapy: WFL for tasks assessed/performed Overall Cognitive Status: Within Functional Limits for tasks assessed                                        General Comments      Exercises Total Joint Exercises Ankle Circles/Pumps: AROM;Both;10 reps Heel Slides: AROM;AAROM;Both;5 reps Hip ABduction/ADduction: AROM;AAROM;Both;5 reps Straight Leg Raises: AROM;AAROM;Both;5 reps Long Arc Quad: AROM;Both;5 reps Knee Flexion: AROM;Both;5 reps Other Exercises Other Exercises: Anterior weight shifting activities in sitting at the EOB Other Exercises: Positioning education in supine with pt and family   Assessment/Plan    PT Assessment Patient needs continued PT services  PT Problem List Decreased strength;Decreased activity tolerance;Decreased balance;Decreased mobility;Decreased coordination;Decreased knowledge of use of DME       PT Treatment Interventions DME instruction;Functional mobility training;Therapeutic activities;Therapeutic exercise;Balance training;Gait training;Neuromuscular re-education;Patient/family education    PT Goals (Current goals can be found in the Care Plan section)  Acute Rehab PT Goals Patient Stated Goal: To return home to HHPT PT Goal Formulation: With patient/family Time For Goal Achievement: 03/07/18 Potential to Achieve Goals: Good    Frequency Min 2X/week   Barriers to discharge        Co-evaluation               AM-PAC PT "6 Clicks" Mobility  Outcome Measure Help needed turning from your back to your side while in a flat bed without using bedrails?: A Lot Help needed moving from lying on your  back to sitting on the side of a flat bed without using bedrails?: A Lot Help needed moving to and from a bed to a chair (including a wheelchair)?: A Lot Help needed standing up from a chair using your arms (e.g., wheelchair or bedside chair)?: A Lot Help needed to walk in hospital room?: Total Help needed climbing 3-5 steps with a railing? : Total 6 Click Score: 10    End of Session Equipment Utilized During Treatment: Gait belt Activity Tolerance: Patient tolerated treatment well Patient left: in bed;with call bell/phone within reach;with bed alarm set;Other (comment)(Prevalon boots donned) Nurse Communication: Mobility status PT Visit Diagnosis: Unsteadiness on feet (R26.81);Muscle weakness (generalized) (M62.81);Difficulty in walking, not elsewhere classified (R26.2);Hemiplegia and hemiparesis Hemiplegia - Right/Left: Left Hemiplegia - dominant/non-dominant: Non-dominant Hemiplegia - caused by: Unspecified    Time: 1500-1550 PT Time Calculation (min) (ACUTE ONLY): 50 min   Charges:   PT Evaluation $PT Eval Low Complexity: 1 Low PT Treatments $Therapeutic Exercise: 8-22 mins $Therapeutic Activity: 8-22 mins        D. Royetta Asal PT, DPT 02/22/18, 4:13 PM

## 2018-02-22 NOTE — Consult Note (Signed)
Pharmacy Antibiotic Note  Sonya [name] is a 85 y.o. female admitted on 02/21/2018 with bacteremia.  Pharmacy has been consulted for vancomycin dosing.  Plan: Patient received vancomycin 1000 mg once in the ED on 02/21/2018 at 1258.  This dose was an appropriate 20-25 mg/kg loading dose therefore will start next dose today at 1300  Vancomycin 500 mg IV Q 24 hrs. Goal AUC 400-550. Expected AUC: 459.1 SCr used: 0.8 (rounded up from 0.55 for calculator)   Height: 5' (152.4 cm) Weight: 96 lb (43.5 kg) IBW/kg (Calculated) : 45.5  Temp (24hrs), Avg:98 F (36.7 C), Min:97.6 F (36.4 C), Max:98.5 F (36.9 C)  Recent Labs  Lab 02/21/18 0854 02/21/18 0905 02/21/18 1012 02/22/18 0410  WBC 21.6*  --   --  11.6*  CREATININE 0.87  --   --  0.55  LATICACIDVEN  --  2.38* 1.8  --     Estimated Creatinine Clearance: 36.6 mL/min (by C-G formula based on SCr of 0.55 mg/dL).    Allergies  Allergen Reactions  . Clopidogrel Nausea And Vomiting     reported by [facility] 11/24/13  . Omeprazole Other (See Comments)    Unknown reaction - reported by [facility] 11/24/13    Antimicrobials this admission: vanco 1/5 >>  Zosyn x 1 dose in the ED  Dose adjustments this admission:   Microbiology results: 1/5 BCx: pending with BCID of MRSA. 1/5 UCx: pending   Sputum:    MRSA PCR:   Thank you for allowing pharmacy to be a part of this patient's care.  Forrest Moron, PharmD Clinical Pharmacist 02/22/2018 7:52 AM

## 2018-02-22 NOTE — Care Management Note (Signed)
Case Management Note  Patient Details  Name: Sonya [name] MRN: [id number] Date of Birth: [birth year]  Subjective/Objective:   Patient admitted with Sepsis.  Patient reports that she was at home and slipped out of a chair and her husband could not get her back up.  Patient reports that she was recently discharged from [facility] and sent home with Home Health. Open with [facility] and [facility] aware of admission, open for PT.  Patient reports that she has a walker, a wheelchair, a bedside commode and a shower chair at home.  Patient does not drive but her husband does and he gets her to appointments.  PCP verified as Dr. Edwina Barth.  RNCM will cont to follow. Doran Clay RN BSN [phone]                  Action/Plan:   Expected Discharge Date:  02/23/18               Expected Discharge Plan:  [place]  In-House Referral:     Discharge planning Services  CM Consult  Post Acute Care Choice:  Home Health Choice offered to:     DME Arranged:    DME Agency:     HH Arranged:  PT [place]:  [facility] (now [facility])  Status of Service:  In process, will continue to follow  If discussed at Long Length of Stay Meetings, dates discussed:    Additional Comments:  Shelbie Hutching, RN 02/22/2018, 4:46 PM

## 2018-02-23 LAB — BASIC METABOLIC PANEL
Anion gap: 9 (ref 5–15)
BUN: 5 mg/dL — ABNORMAL LOW (ref 8–23)
CO2: 26 mmol/L (ref 22–32)
CREATININE: 0.45 mg/dL (ref 0.44–1.00)
Calcium: 8.4 mg/dL — ABNORMAL LOW (ref 8.9–10.3)
Chloride: 104 mmol/L (ref 98–111)
GFR calc Af Amer: >60 mL/min (ref >60)
GFR calc non Af Amer: >60 mL/min (ref >60)
Glucose, Bld: 109 mg/dL — ABNORMAL HIGH (ref 70–99)
Potassium: 3.5 mmol/L (ref 3.5–5.1)
Sodium: 139 mmol/L (ref 135–145)

## 2018-02-23 LAB — CBC
HCT: 36.4 % (ref 36.0–46.0)
Hemoglobin: 11.2 g/dL — ABNORMAL LOW (ref 12.0–15.0)
MCH: 26.2 pg (ref 26.0–34.0)
MCHC: 30.8 g/dL (ref 30.0–36.0)
MCV: 85 fL (ref 80.0–100.0)
Platelets: 370 10*3/uL (ref 150–400)
RBC: 4.28 MIL/uL (ref 3.87–5.11)
RDW: 14.4 % (ref 11.5–15.5)
WBC: 13 10*3/uL — ABNORMAL HIGH (ref 4.0–10.5)
nRBC: 0 % (ref 0.0–0.2)

## 2018-02-23 LAB — MAGNESIUM: MAGNESIUM: 1.4 mg/dL — AB (ref 1.7–2.4)

## 2018-02-23 MED ORDER — MAGNESIUM SULFATE 2 GM/50ML IV SOLN
2.0000 g | Freq: Once | INTRAVENOUS | Status: AC
  Administered 2018-02-23: 2 g via INTRAVENOUS
  Filled 2018-02-23: qty 50

## 2018-02-23 MED ORDER — CHLORHEXIDINE GLUCONATE CLOTH 2 % EX PADS
6.0000 | MEDICATED_PAD | Freq: Every day | CUTANEOUS | Status: DC
  Administered 2018-02-23 – 2018-02-24 (×2): 6 via TOPICAL

## 2018-02-23 MED ORDER — MUPIROCIN 2 % EX OINT
1.0000 "application " | TOPICAL_OINTMENT | Freq: Two times a day (BID) | CUTANEOUS | Status: DC
  Administered 2018-02-23 – 2018-02-24 (×3): 1 via NASAL
  Filled 2018-02-23: qty 22

## 2018-02-23 MED ORDER — IBUPROFEN 400 MG PO TABS
400.0000 mg | ORAL_TABLET | Freq: Four times a day (QID) | ORAL | Status: DC | PRN
  Administered 2018-02-23 – 2018-02-24 (×2): 400 mg via ORAL
  Filled 2018-02-23 (×2): qty 1

## 2018-02-23 NOTE — Progress Notes (Signed)
[employer] - [place] at [facility]   PATIENT NAME: Sonya [name]    MR#:  [id number]  DATE OF BIRTH:  04/23/1932  SUBJECTIVE:  CHIEF COMPLAINT:   Chief Complaint  Patient presents with  . Fall    No new complaint this morning.  No fevers overnight.  Resting comfortably in bed. Patient seen by physical therapist already with recommendations.  REVIEW OF SYSTEMS:  Review of Systems  Constitutional: Negative for chills and fever.  HENT: Negative for hearing loss and tinnitus.   Eyes: Negative for blurred vision and double vision.  Respiratory: Negative for cough and hemoptysis.   Cardiovascular: Negative for chest pain and palpitations.  Gastrointestinal: Negative for heartburn, nausea and vomiting.  Genitourinary: Negative for dysuria and urgency.  Musculoskeletal: Negative for neck pain.       Generalized weakness  Skin: Negative for itching and rash.  Neurological: Negative for dizziness and headaches.  Psychiatric/Behavioral: Positive for depression. Negative for hallucinations.   .ros DRUG ALLERGIES:   Allergies  Allergen Reactions  . Clopidogrel Nausea And Vomiting     reported by [facility] 11/24/13  . Omeprazole Other (See Comments)    Unknown reaction - reported by [facility] 11/24/13   VITALS:  Blood pressure 140/76, pulse (!) 101, temperature 98.1 F (36.7 C), temperature source Oral, resp. rate 18, height 5' (1.524 m), weight 45.4 kg, SpO2 97 %. PHYSICAL EXAMINATION:    Physical Exam  Constitutional: She is oriented to person, place, and time. She appears well-developed and well-nourished.  HENT:  Head: Normocephalic and atraumatic.  Eyes: Pupils are equal, round, and reactive to light. Conjunctivae are normal.  Neck: Normal range of motion. Neck supple.  Cardiovascular: Normal rate and regular rhythm.  Respiratory: Effort normal.  GI: Soft. Bowel sounds are normal.  Musculoskeletal:      General: No tenderness or edema.     Comments: Generalized weakness with more weakness on the left due to history of prior CVA  Neurological: She is alert and oriented to person, place, and time.  Skin: Skin is warm and dry.   LABORATORY PANEL:  Female CBC Recent Labs  Lab 02/23/18 0449  WBC 13.0*  HGB 11.2*  HCT 36.4  PLT 370   ------------------------------------------------------------------------------------------------------------------ Chemistries  Recent Labs  Lab 02/21/18 0854  02/23/18 0449  NA 137   < > 139  K 2.7*   < > 3.5  CL 100   < > 104  CO2 23   < > 26  GLUCOSE 148*   < > 109*  BUN 13   < > 5*  CREATININE 0.87   < > 0.45  CALCIUM 9.7   < > 8.4*  MG 1.6*  --  1.4*  AST 29  --   --   ALT 20  --   --   ALKPHOS 76  --   --   BILITOT 0.9  --   --    < > = values in this interval not displayed.   RADIOLOGY:  No results found. ASSESSMENT AND PLAN:     1.Sepsis secondary to bacteremia  Present on admission Patient with evidence of leukocytosis with white count of 21,000.  Was tachycardic.  Leukocytosis improved with white blood cell count down to 11,000 yesterday.  Noted slight increase in white blood cell count to 13000 today.  1 set of blood culture growing coagulase-negative staph.  High clinical suspicion of being a contaminant.  Requested for  repeat blood cultures.  If repeat blood cultures negative will discontinue antibiotics in a.m.   Patient currently on IV vancomycin with pharmacist assisting with renal dosing.  Continue IV fluid hydration.    2.Severe hypokalemia. Replaced.   Hypomagnesemia; replace.  Follow-up on repeat levels in a.m.  3.History of CVA.   Patient noted to be on aspirin , Eliquis and statins prior to presentation.  These were continued. Patient not sure why she was placed on Eliquis .  Suspect underlying history of A. Fib.  4. Hypertensive urgency.   Home blood pressure medications including clonidine and Norvasc already  resumed.   Blood pressure better controlled this morning with recent blood pressure of 140/76.  5.  Fall prior to presentation CT scan of the head done without contrast negative for any acute findings Seen by physical therapist.  Recommendation is for home health with physical therapy on discharge.  6.  Left ankle pressure injury; unstageable Wound care nurse consult placed.  Follow-up on recommendations  DVT prophylaxis.  Patient is on Eliquis.  CODE STATUS: Full code   All the records are reviewed and case discussed with Care Management/Social Worker. Management plans discussed with the patient, family and they are in agreement.  CODE STATUS: Full Code  TOTAL TIME TAKING CARE OF THIS PATIENT: 35 minutes.   More than 50% of the time was spent in counseling/coordination of care: YES  POSSIBLE D/C IN 1-2 LL DAYS, DEPENDING ON CLINICAL CONDITION.   Ulyssa Walthour M.D on 02/23/2018 at 1:29 PM  Between 7am to 6pm - Pager - [phone]  After 6pm go to [email or web address] - Social research officer, government  [employer] [place] Hospitalists  Office  [phone]  CC: Primary care physician; Gracelyn Nurse, MD  Note: This dictation was prepared with Dragon dictation along with smaller phrase technology. Any transcriptional errors that result from this process are unintentional.

## 2018-02-23 NOTE — Progress Notes (Signed)
Physical Therapy Treatment Patient Details Name: Sonya [name] MRN: [id number] DOB: October 26, 1932 Today's Date: 02/23/2018    History of Present Illness Pt is an 85 y.o. female with a known history of hypertension, CVA with significant left-sided weakness  presented to the emergency room after she was found on the floor.  Patient slid out of her chair and was unable to get off the floor and laid there all night.  Son brought her to the emergency room.  Husband was at home but could not get her up.  Here patient has been found to have significant leukocytosis and tachycardia with hypokalemia.  No signs of infection on urine or chest x-ray.  Assessment includes: sepsis, hypokalemia, HTN, and L ankle pressure injury.    PT Comments    Pt reporting she normally gets up to w/c with her son's assist at home and had not been up to chair since hospital admission.  Pt able to perform semi-supine to sit with max assist and then stand and take a few steps bed to recliner with mod to max assist x1 (to R side).  Pt steady with sitting balance with R UE support on bed rail with occasional cueing to shift weight forward (otherwise requiring assist for balance without UE support).  Discussed pt's assist levels with pt's NT and primary nurse (recommend 2 assist transferring to R side back to bed--move/reposition chair for this--or use hoyer lift for safety if staff did not feel comfortable with 2 assist transfer).  Pt appears close to recent functional level s/p stroke per pt report: continue to recommend home with 24/7 assist w/c level and HHPT.   Follow Up Recommendations  Home health PT;Supervision/Assistance - 24 hour     Equipment Recommendations  Wheelchair (measurements PT);Wheelchair cushion (measurements PT)    Recommendations for Other Services       Precautions / Restrictions Precautions Precautions: Fall Precaution Comments: B prevalon boots Restrictions Weight Bearing Restrictions: No     Mobility  Bed Mobility Overal bed mobility: Needs Assistance Bed Mobility: Supine to Sit     Supine to sit: Max assist     General bed mobility comments: assist for trunk and B LE's semi-supine to sit; use of bed rail  Transfers Overall transfer level: Needs assistance Equipment used: None Transfers: Sit to/from [facility] Sit to Stand: Mod assist Stand pivot transfers: Mod assist;Max assist       General transfer comment: assist to initiate stand and then take a few steps bed to recliner to R side  Ambulation/Gait             General Gait Details: Deferred (pt recently not ambulatory)   Stairs             Wheelchair Mobility    Modified Rankin (Stroke Patients Only)       Balance Overall balance assessment: Needs assistance Sitting-balance support: Single extremity supported;Feet supported Sitting balance-Leahy Scale: Poor Sitting balance - Comments: pt requiring R UE support on bed rail to maintain sitting balance with occasional vc's to shift weight forward (otherwise posterior L lean noted with intermittent assist for balance) Postural control: Posterior lean;Left lateral lean Standing balance support: Single extremity supported Standing balance-Leahy Scale: Poor Standing balance comment: pt requiring single UE support with minimal assist of therapist to maintain upright balance                            Cognition Arousal/Alertness: Awake/alert Behavior  During Therapy: WFL for tasks assessed/performed Overall Cognitive Status: Within Functional Limits for tasks assessed                                        Exercises      General Comments  Pt agreeable to PT session.      Pertinent Vitals/Pain Pain Assessment: 0-10 Pain Score: 7  Pain Location: chronic back pain Pain Descriptors / Indicators: Aching Pain Intervention(s): Limited activity within patient's tolerance;Monitored during  session;Repositioned;Other (comment)(RN notified)    Home Living                      Prior Function            PT Goals (current goals can now be found in the care plan section) Acute Rehab PT Goals Patient Stated Goal: To return home to HHPT PT Goal Formulation: With patient/family Time For Goal Achievement: 03/07/18 Potential to Achieve Goals: Good Progress towards PT goals: Progressing toward goals    Frequency    Min 2X/week      PT Plan Current plan remains appropriate    Co-evaluation              AM-PAC PT "6 Clicks" Mobility   Outcome Measure  Help needed turning from your back to your side while in a flat bed without using bedrails?: A Lot Help needed moving from lying on your back to sitting on the side of a flat bed without using bedrails?: A Lot Help needed moving to and from a bed to a chair (including a wheelchair)?: A Lot Help needed standing up from a chair using your arms (e.g., wheelchair or bedside chair)?: A Lot Help needed to walk in hospital room?: Total Help needed climbing 3-5 steps with a railing? : Total 6 Click Score: 10    End of Session Equipment Utilized During Treatment: Gait belt Activity Tolerance: Patient tolerated treatment well Patient left: in chair;with call bell/phone within reach;with chair alarm set;Other (comment)(B prevalon boots in place; pillows placed on pt's L and R side for optimal midline positioning) Nurse Communication: Mobility status;Need for lift equipment;Precautions PT Visit Diagnosis: Unsteadiness on feet (R26.81);Muscle weakness (generalized) (M62.81);Difficulty in walking, not elsewhere classified (R26.2);Hemiplegia and hemiparesis Hemiplegia - Right/Left: Left Hemiplegia - dominant/non-dominant: Non-dominant Hemiplegia - caused by: Unspecified     Time: 2244-9753 PT Time Calculation (min) (ACUTE ONLY): 26 min  Charges:  $Therapeutic Activity: 23-37 mins                    Leitha Bleak, PT 02/23/18, 4:44 PM [phone]

## 2018-02-24 ENCOUNTER — Ambulatory Visit: Admission: RE | Admit: 2018-02-24 | Payer: Medicare PPO | Source: Ambulatory Visit

## 2018-02-24 LAB — CULTURE, BLOOD (ROUTINE X 2): Special Requests: ADEQUATE

## 2018-02-24 LAB — MAGNESIUM: Magnesium: 1.9 mg/dL (ref 1.7–2.4)

## 2018-02-24 LAB — BASIC METABOLIC PANEL
Anion gap: 8 (ref 5–15)
BUN: 6 mg/dL — ABNORMAL LOW (ref 8–23)
CALCIUM: 8.4 mg/dL — AB (ref 8.9–10.3)
CO2: 23 mmol/L (ref 22–32)
Chloride: 106 mmol/L (ref 98–111)
Creatinine, Ser: 0.54 mg/dL (ref 0.44–1.00)
GFR calc Af Amer: >60 mL/min (ref >60)
GFR calc non Af Amer: >60 mL/min (ref >60)
Glucose, Bld: 121 mg/dL — ABNORMAL HIGH (ref 70–99)
Potassium: 3.5 mmol/L (ref 3.5–5.1)
Sodium: 137 mmol/L (ref 135–145)

## 2018-02-24 LAB — CBC
HCT: 36.6 % (ref 36.0–46.0)
Hemoglobin: 11.6 g/dL — ABNORMAL LOW (ref 12.0–15.0)
MCH: 26.7 pg (ref 26.0–34.0)
MCHC: 31.7 g/dL (ref 30.0–36.0)
MCV: 84.1 fL (ref 80.0–100.0)
Platelets: 366 10*3/uL (ref 150–400)
RBC: 4.35 MIL/uL (ref 3.87–5.11)
RDW: 14.1 % (ref 11.5–15.5)
WBC: 11.5 10*3/uL — ABNORMAL HIGH (ref 4.0–10.5)
nRBC: 0 % (ref 0.0–0.2)

## 2018-02-24 MED ORDER — BISACODYL 5 MG PO TBEC: 10.0000 mg | DELAYED_RELEASE_TABLET | Freq: Every day | ORAL | 0 refills | Status: DC

## 2018-02-24 MED ORDER — HYDROCHLOROTHIAZIDE 12.5 MG PO CAPS: 12.5000 mg | ORAL_CAPSULE | Freq: Every day | ORAL | 0 refills | Status: DC

## 2018-02-24 MED ORDER — SIMVASTATIN 40 MG PO TABS: 40.0000 mg | ORAL_TABLET | Freq: Every day | ORAL | 0 refills | Status: DC

## 2018-02-24 MED ORDER — ONDANSETRON 4 MG PO TBDP: 4.0000 mg | ORAL_TABLET | Freq: Three times a day (TID) | ORAL | 0 refills | Status: DC | PRN

## 2018-02-24 MED ORDER — APIXABAN 5 MG PO TABS: 5.0000 mg | ORAL_TABLET | Freq: Two times a day (BID) | ORAL | 0 refills | Status: DC

## 2018-02-24 MED ORDER — AMLODIPINE BESYLATE 10 MG PO TABS: 10.0000 mg | ORAL_TABLET | Freq: Every day | ORAL | 0 refills | Status: DC

## 2018-02-24 NOTE — Care Management Important Message (Signed)
Important Message  Patient Details  Name: Sonya [name] MRN: [id number] Date of Birth: 1932/06/10   Medicare Important Message Given:  Yes Son in the room with patient and her husband- okay for son to sign.    Shelbie Hutching, RN 02/24/2018, 10:56 AM

## 2018-02-24 NOTE — Care Management Note (Signed)
Case Management Note  Patient Details  Name: Sonya [name] MRN: [id number] Date of Birth: 1932-03-25  Subjective/Objective:       Sonya [name] with [facility] notified of patient discharge.  Home health services will resume with [facility]. Doran Clay RN BSN [phone]              Action/Plan:   Expected Discharge Date:  02/24/18               Expected Discharge Plan:  [place]  In-House Referral:     Discharge planning Services  CM Consult  Post Acute Care Choice:  Home Health Choice offered to:  Spouse, Patient  DME Arranged:    DME Agency:     HH Arranged:  PT [place]:  [facility] (now [facility])  Status of Service:  Completed, signed off  If discussed at Central of Stay Meetings, dates discussed:    Additional Comments:  Shelbie Hutching, RN 02/24/2018, 11:00 AM

## 2018-02-24 NOTE — Plan of Care (Signed)
Pt being d/ced home.  Admitted for sepsis, she's been afebrile and is having no complaints except for chronic back pain.  IVs removed.  Dr wrote scripts for meds that family needed refilled.  Will review d/c instructions with son.  EMS will transport her home for pt safety.

## 2018-02-24 NOTE — Discharge Summary (Signed)
[facility] [place] [facility]   PATIENT NAME: Sonya [name]    MR#:  [id number]  DATE OF BIRTH:  1933/01/15  DATE OF ADMISSION:  02/21/2018   ADMITTING PHYSICIAN: Milagros Loll, MD  DATE OF DISCHARGE: [phone] 2020  PRIMARY CARE PHYSICIAN: Gracelyn Nurse, MD   ADMISSION DIAGNOSIS:  Dehydration [E86.0] Weakness [R53.1] DISCHARGE DIAGNOSIS:  Active Problems:   Sepsis (HCC)   Pressure injury of skin   Decubitus ulcer of left ankle, unstageable (HCC)  SECONDARY DIAGNOSIS:   Past Medical History:  Diagnosis Date  . CAD (coronary artery disease)   . Carpal tunnel syndrome   . GERD (gastroesophageal reflux disease)   . History of shingles   . Hormone receptor positive breast cancer (HCC)   . Hyperlipemia   . Hypertension   . Stroke [facility])    HOSPITAL COURSE:  Chief complaint; fall  HPI Jarita Toomes  is a 85 y.o. female with a known history of hypertension, CVA with significant left-sided weakness  presented to the emergency room after she was found on the floor.  Patient slid out of her chair and was unable to get off the floor and laid there all night.  Son brought her to the emergency room.  Husband was at home but could not get her up.  Here patient has been found to have significant leukocytosis and tachycardia with hypokalemia.  No signs of infection on urine or chest x-ray.  Lactic acid elevated.  Patient is being admitted for possible sepsis, hypokalemia and dehydration.  Please refer to the H&P dictated for further details.   HOSPITAL COURSE: 1.Sepsis secondary to bacteremia  Present on admission Patient with evidence of leukocytosis with white count of 21,000.  Was tachycardic.  Leukocytosis improved with white blood cell count down to 11,000.  Patient has remained afebrile.  Remains hemodynamically stable.  1 set of blood culture growing coagulase-negative staph.  Repeat blood cultures came back negative.  This is most likely contaminant.   Patient has remained completely asymptomatic.  Adequately hydrated with IV fluids.  Being discharged back to nursing facility.  2.Severe hypokalemia. Replaced.   Hypomagnesemia;  replaced.  3. History of CVA.  Patient noted to be on aspirin , Eliquis and statins prior to presentation.  These were continued. Patient not sure why she was placed on Eliquis .  Suspect underlying history of A. Fib.  4. Hypertensive urgency.  Home blood pressure medications including clonidine and Norvasc already resumed.  Blood pressure improved.  Hydrochlorothiazide was started during this admission.  Prescription for the same given.   5.  Fall prior to presentation CT scan of the head done without contrast negative for any acute findings Seen by physical therapist.  Recommendation is for home health with physical therapy on discharge.  Case manager to set up on discharge.  6.  Left ankle pressure injury; unstageable Continue local wound care on discharge.  Patient requested for prescription for several home meds which were given on discharge.  Follow-up with primary care physician   DISCHARGE CONDITIONS:  Stable CONSULTS OBTAINED:   DRUG ALLERGIES:   Allergies  Allergen Reactions  . Clopidogrel Nausea And Vomiting     reported by [facility] 11/24/13  . Omeprazole Other (See Comments)    Unknown reaction - reported by [facility] 11/24/13   DISCHARGE MEDICATIONS:   Allergies as of 02/24/2018      Reactions   Clopidogrel Nausea And Vomiting  reported by [facility] 11/24/13   Omeprazole Other (See Comments)   Unknown reaction - reported by [facility] 11/24/13      Medication List    TAKE these medications   acetaminophen 325 MG tablet Commonly known as:  TYLENOL Take 2 tablets (650 mg total) by mouth every 4 (four) hours as needed for mild pain (or temp > 37.5 C (99.5 F)).   amLODipine 10 MG tablet Commonly  known as:  NORVASC Take 1 tablet (10 mg total) by mouth daily.   apixaban 5 MG Tabs tablet Commonly known as:  ELIQUIS Take 1 tablet (5 mg total) by mouth 2 (two) times daily. Two tablets twice a day through 11/17/2017 then one tablet twice a day starting 11/18/2017 What changed:    how much to take  how to take this  when to take this   aspirin 81 MG EC tablet Take 1 tablet (81 mg total) by mouth daily.   b complex vitamins tablet Take 1 tablet by mouth daily.   bisacodyl 5 MG EC tablet Commonly known as:  DUCODYL Take 2 tablets (10 mg total) by mouth daily.   cloNIDine 0.2 MG tablet Commonly known as:  CATAPRES Take 1 tablet (0.2 mg total) by mouth 2 (two) times daily.   famotidine 20 MG tablet Commonly known as:  PEPCID Take 1 tablet (20 mg total) by mouth 2 (two) times daily.   hydrochlorothiazide 12.5 MG capsule Commonly known as:  MICROZIDE Take 1 capsule (12.5 mg total) by mouth daily. Start taking on:  February 25, 2018   lansoprazole 15 MG capsule Commonly known as:  PREVACID Take 15 mg by mouth at bedtime.   levothyroxine 25 MCG tablet Commonly known as:  SYNTHROID, LEVOTHROID Take 25 mcg by mouth daily before breakfast.   magic mouthwash Soln Take 5 mLs by mouth 3 (three) times daily. Swish and spit   Melatonin 3 MG Tabs Take 3 mg by mouth at bedtime as needed.   nystatin 100000 UNIT/ML suspension Commonly known as:  MYCOSTATIN Take 5 mLs by mouth 4 (four) times daily.   ondansetron 4 MG disintegrating tablet Commonly known as:  ZOFRAN-ODT Take 1 tablet (4 mg total) by mouth every 8 (eight) hours as needed for nausea or vomiting.   oxyCODONE 5 MG immediate release tablet Commonly known as:  Oxy IR/ROXICODONE Take 1 tablet (5 mg total) by mouth every 6 (six) hours as needed for severe pain.   polyethylene glycol packet Commonly known as:  MIRALAX / GLYCOLAX Take 17 g by mouth daily as needed for moderate constipation.   senna-docusate 8.6-50 MG  tablet Commonly known as:  Senokot-S Take 2 tablets by mouth at bedtime.   simvastatin 40 MG tablet Commonly known as:  ZOCOR Take 1 tablet (40 mg total) by mouth daily at 6 PM.   venlafaxine XR 150 MG 24 hr capsule Commonly known as:  EFFEXOR-XR Take 150 mg by mouth at bedtime.   Vitamin D3 25 MCG (1000 UT) Caps Take 3,000 Units by mouth at bedtime.        DISCHARGE INSTRUCTIONS:   DIET:  Cardiac diet DISCHARGE CONDITION:  Stable ACTIVITY:  Activity as tolerated OXYGEN:  Home Oxygen: No.  Oxygen Delivery: room air DISCHARGE LOCATION:  home   If you experience worsening of your admission symptoms, develop shortness of breath, life threatening emergency, suicidal or homicidal thoughts you must seek medical attention immediately by calling 911 or calling your MD immediately  if symptoms less severe.  You Must read complete instructions/literature along with all the possible adverse reactions/side effects for all the Medicines you take and that have been prescribed to you. Take any new Medicines after you have completely understood and accpet all the possible adverse reactions/side effects.   Please note  You were cared for by a hospitalist during your hospital stay. If you have any questions about your discharge medications or the care you received while you were in the hospital after you are discharged, you can call the unit and asked to speak with the hospitalist on call if the hospitalist that took care of you is not available. Once you are discharged, your primary care physician will handle any further medical issues. Please note that NO REFILLS for any discharge medications will be authorized once you are discharged, as it is imperative that you return to your primary care physician (or establish a relationship with a primary care physician if you do not have one) for your aftercare needs so that they can reassess your need for medications and monitor your lab  values.    On the day of Discharge:  VITAL SIGNS:  Blood pressure (!) 158/61, pulse (!) 103, temperature 98 F (36.7 C), temperature source Oral, resp. rate 18, height 5' (1.524 m), weight 45.4 kg, SpO2 97 %. PHYSICAL EXAMINATION:  GENERAL:  85 y.o.-year-old patient lying in the bed with no acute distress.  EYES: Pupils equal, round, reactive to light and accommodation. No scleral icterus. Extraocular muscles intact.  HEENT: Head atraumatic, normocephalic. Oropharynx and nasopharynx clear.  NECK:  Supple, no jugular venous distention. No thyroid enlargement, no tenderness.  LUNGS: Normal breath sounds bilaterally, no wheezing, rales,rhonchi or crepitation. No use of accessory muscles of respiration.  CARDIOVASCULAR: S1, S2 normal. No murmurs, rubs, or gallops.  ABDOMEN: Soft, non-tender, non-distended. Bowel sounds present. No organomegaly or mass.  EXTREMITIES: No pedal edema, cyanosis, or clubbing.  NEUROLOGIC: Cranial nerves II through XII are intact. Muscle strength 5/5 in all extremities. Sensation intact. Gait not checked.  PSYCHIATRIC: The patient is alert and oriented x 3.  SKIN: No obvious rash, lesion, or ulcer.  DATA REVIEW:   CBC Recent Labs  Lab 02/24/18 0502  WBC 11.5*  HGB 11.6*  HCT 36.6  PLT 366    Chemistries  Recent Labs  Lab 02/21/18 0854  02/24/18 0502  NA 137   < > 137  K 2.7*   < > 3.5  CL 100   < > 106  CO2 23   < > 23  GLUCOSE 148*   < > 121*  BUN 13   < > 6*  CREATININE 0.87   < > 0.54  CALCIUM 9.7   < > 8.4*  MG 1.6*   < > 1.9  AST 29  --   --   ALT 20  --   --   ALKPHOS 76  --   --   BILITOT 0.9  --   --    < > = values in this interval not displayed.     Microbiology Results  Results for orders placed or performed during the hospital encounter of 02/21/18  Urine Culture     Status: None   Collection Time: 02/21/18  8:54 AM  Result Value Ref Range Status   Specimen Description   Final    URINE, CATHETERIZED Performed at [facility], [address] [place], [place] [address]    Special Requests  Final    NONE Performed at [facility], [address], [place], [place] [address]    Culture   Final    NO GROWTH Performed at [facility], [address] [place], [place] [address]    Report Status 02/22/2018 FINAL  Final  Blood culture (routine x 2)     Status: Abnormal   Collection Time: 02/21/18  8:54 AM  Result Value Ref Range Status   Specimen Description   Final    BLOOD LEFT HAND Performed at [facility], [address] [place], [place] [address]    Special Requests   Final    BOTTLES DRAWN AEROBIC AND ANAEROBIC Blood Culture adequate volume Performed at [facility], [address], [place], [place] [address]    Culture  Setup Time   Final    Organism ID to follow GRAM POSITIVE COCCI ANAEROBIC BOTTLE ONLY CRITICAL RESULT CALLED TO, READ BACK BY AND VERIFIED WITH: DAVID BESANTI ON 02/22/2018 AT 0232 QSD Performed at [facility], [address] [place], [place] [address]    Culture (A)  Final    STAPHYLOCOCCUS SPECIES (COAGULASE NEGATIVE) THE SIGNIFICANCE OF ISOLATING THIS ORGANISM FROM A SINGLE SET OF BLOOD CULTURES WHEN MULTIPLE SETS ARE DRAWN IS UNCERTAIN. PLEASE NOTIFY THE MICROBIOLOGY DEPARTMENT WITHIN ONE WEEK IF SPECIATION AND SENSITIVITIES ARE REQUIRED. Performed at [facility], [address], [place], [place] [address]    Report Status 02/24/2018 FINAL  Final  Blood culture (routine x 2)     Status: None (Preliminary result)   Collection Time: 02/21/18  8:54 AM  Result Value Ref Range Status   Specimen Description BLOOD RIGHT ARM  Final   Special Requests   Final    BOTTLES DRAWN AEROBIC AND ANAEROBIC Blood Culture adequate volume   Culture   Final    NO GROWTH 3 DAYS Performed at [facility], [address] [place], [place] [address]    Report Status PENDING  Incomplete  Blood Culture ID Panel (Reflexed)      Status: Abnormal   Collection Time: 02/21/18  8:54 AM  Result Value Ref Range Status   Enterococcus species NOT DETECTED NOT DETECTED Final   Listeria monocytogenes NOT DETECTED NOT DETECTED Final   Staphylococcus species DETECTED (A) NOT DETECTED Final    Comment: Methicillin (oxacillin) resistant coagulase negative staphylococcus. Possible blood culture contaminant (unless isolated from more than one blood culture draw or clinical case suggests pathogenicity). No antibiotic treatment is indicated for blood  culture contaminants. CRITICAL RESULT CALLED TO, READ BACK BY AND VERIFIED WITH: DAVID BESANTI ON 02/22/2018 AT 0232 QSD    Staphylococcus aureus (BCID) NOT DETECTED NOT DETECTED Final   Methicillin resistance DETECTED (A) NOT DETECTED Final    Comment: CRITICAL RESULT CALLED TO, READ BACK BY AND VERIFIED WITH: DAVID BESANTI ON 02/22/2018 AT 0232 QSD    Streptococcus species NOT DETECTED NOT DETECTED Final   Streptococcus agalactiae NOT DETECTED NOT DETECTED Final   Streptococcus pneumoniae NOT DETECTED NOT DETECTED Final   Streptococcus pyogenes NOT DETECTED NOT DETECTED Final   Acinetobacter baumannii NOT DETECTED NOT DETECTED Final   Enterobacteriaceae species NOT DETECTED NOT DETECTED Final   Enterobacter cloacae complex NOT DETECTED NOT DETECTED Final   Escherichia coli NOT DETECTED NOT DETECTED Final   Klebsiella oxytoca NOT DETECTED NOT DETECTED Final   Klebsiella pneumoniae NOT DETECTED NOT DETECTED Final   Proteus species NOT DETECTED NOT DETECTED Final   Serratia marcescens  NOT DETECTED NOT DETECTED Final   Haemophilus influenzae NOT DETECTED NOT DETECTED Final   Neisseria meningitidis NOT DETECTED NOT DETECTED Final   Pseudomonas aeruginosa NOT DETECTED NOT DETECTED Final   Candida albicans NOT DETECTED NOT DETECTED Final   Candida glabrata NOT DETECTED NOT DETECTED Final   Candida krusei NOT DETECTED NOT DETECTED Final   Candida parapsilosis NOT DETECTED NOT DETECTED  Final   Candida tropicalis NOT DETECTED NOT DETECTED Final    Comment: Performed at [facility], [address] [place], [place] [address]  MRSA PCR Screening     Status: Abnormal   Collection Time: 02/22/18  4:19 PM  Result Value Ref Range Status   MRSA by PCR POSITIVE (A) NEGATIVE Final    Comment:        The GeneXpert MRSA Assay (FDA approved for NASAL specimens only), is one component of a comprehensive MRSA colonization surveillance program. It is not intended to diagnose MRSA infection nor to guide or monitor treatment for MRSA infections. RESULT CALLED TO, READ BACK BY AND VERIFIED WITH: PHYLLIS KING AT 1827 02/22/2018.  TFK Performed at [facility], [address] [place], [place] [address]   CULTURE, BLOOD (ROUTINE X 2) w Reflex to ID Panel     Status: None (Preliminary result)   Collection Time: 02/23/18  2:07 PM  Result Value Ref Range Status   Specimen Description BLOOD BLOOD LEFT HAND  Final   Special Requests   Final    BOTTLES DRAWN AEROBIC AND ANAEROBIC Blood Culture adequate volume   Culture   Final    NO GROWTH < 24 HOURS Performed at [facility], [address], [place], [place] [address]    Report Status PENDING  Incomplete  CULTURE, BLOOD (ROUTINE X 2) w Reflex to ID Panel     Status: None (Preliminary result)   Collection Time: 02/23/18  2:12 PM  Result Value Ref Range Status   Specimen Description BLOOD BLOOD LEFT HAND  Final   Special Requests   Final    BOTTLES DRAWN AEROBIC AND ANAEROBIC Blood Culture adequate volume   Culture   Final    NO GROWTH < 24 HOURS Performed at [facility], [address] [place], [place] [address]    Report Status PENDING  Incomplete    RADIOLOGY:  No results found.   Management plans discussed with the patient, family and they are in agreement.  CODE STATUS: Full Code   TOTAL TIME TAKING CARE OF THIS PATIENT: 40 minutes.    Leilanie Rauda M.D on 02/24/2018 at 2:19  PM  Between 7am to 6pm - Pager - [phone]  After 6pm go to [email or web address] - Social research officer, government  [facility] [place] [facility]  Office  [phone]  CC: Primary care physician; Gracelyn Nurse, MD   Note: This dictation was prepared with Dragon dictation along with smaller phrase technology. Any transcriptional errors that result from this process are unintentional.

## 2018-02-25 ENCOUNTER — Encounter: Payer: Self-pay | Admitting: *Deleted

## 2018-02-26 LAB — CULTURE, BLOOD (ROUTINE X 2)
Culture: NO GROWTH
SPECIAL REQUESTS: ADEQUATE

## 2018-02-28 LAB — CULTURE, BLOOD (ROUTINE X 2)
CULTURE: NO GROWTH
Culture: NO GROWTH
Special Requests: ADEQUATE
Special Requests: ADEQUATE

## 2018-03-04 ENCOUNTER — Ambulatory Visit: Payer: Medicare PPO | Admitting: Adult Health

## 2018-03-04 ENCOUNTER — Encounter: Payer: Medicare PPO | Admitting: Student

## 2018-03-04 NOTE — Progress Notes (Deleted)
[facility] [address] [place]. [place] [address] [phone]       OFFICE FOLLOW UP NOTE  [name] Date of Birth:  [birth year] Medical Record Number:  [id number]   Reason for Referral:  hospital stroke follow up  CHIEF COMPLAINT:  No chief complaint on file.  Interval history 03/04/2018: Patient is being seen today for six-month follow-up visit after multiple cortical infarcts on 10/12/17.   11/26/2017 visit: Patient is being seen today for hospital follow-up and is accompanied by her grandson.  She continues to reside [place] where she receives PT/OT/ST.  She continues to have left hemiplegia/paresis but she does feel as though it has improved some such as being able to shrug her shoulder and slightly below the left her leg up.  She did sustain a fall last week when she did not wait for assistance to be brought to the bathroom and attempted to ambulate independently.  She fell on her right side where she sustained a fractured collarbone and bruising on her hip and face.  She is currently wearing a sling on her right arm but otherwise recovering well.  She continues to take aspirin and Eliquis without side effects of bleeding or bruising.  Continues to take simvastatin without side effects of myalgias.  Blood pressure today satisfactory 139/80.  She was living independently before her stroke and being completely active and she is eager to get back to this point.  No further concerns at this time.  Denies new or worsening stroke/TIA symptoms.  Hospital admission 10/12/2017: [name] is a 85 y.o. female with history of HTN, hypothyroidism, CKD stage III, CAD who presented with L HP and L facial droop along with generalized weakness and fatigue for the past week.  CT head reviewed and was negative for acute infarct.  MRI brain reviewed and showed right posterior insula, frontal operculum and posterior lateral frontal lobe infarct along  with right MCA thrombus and small vessel disease.  CTA head and neck showed right M2 branch occlusion, bilateral VA origin severe stenosis, right ICA <50% stenosis, left ICA 50-70 percent stenosis and left subclavian severe > 70% stenosis.  Repeat CT head due to neuro worsening which showed extension of the right MCA infarct.  2D echo showed an EF of 60 to 65% without cardiac source of embolus but did show moderate pulmonary hypertension and LA enlargement.  Lower extremity venous Dopplers negative for DVT.  Due to embolic infarcts secondary to multiple intracranial atherosclerosis versus cardioembolic source, was recommended for patient to undergo 30-day cardiac monitoring to rule out atrial fibrillation at this time due to severe neuro deficit.  It was recommended if it is unrevealing and neuro condition improves, will be considered to perform TEE with loop recorder placement at that time.  LDL 76 and recommended continuation of Zocor 40 mg daily.  Patient was found to be in hypertensive urgency upon arrival with SBP 230s and after antihypertensive adjustments, blood pressure stabilized and recommended long-term BP goal 1 30-1 50 due to intracranial stenosis.  A1c 5.9.  Aspirin 81 mg daily PTA and recommended aspirin 325 mg daily as patient is intolerant to Plavix due to nausea.  Therapies recommended discharge to SNF with diet recommendations of dysphagia 1 with NTL and was discharged in stable condition.  Since hospital discharge, patient has been evaluated in ED due to N/V and abdominal pain.  She underwent EGD on 11/06/2017 which showed hiatal hernia, gastric erythema and esophageal stricture  along with undergoing ERCP on 11/10/2017 for choledocholithiasis.  Per notes, GI recommended surgical evaluation for cholecystectomy but due to recent stroke and multiple comorbidities it was determined that she is high risk and recommended for close follow-up to consider this in the near future.  She was also found to  have a DVT in her left lower extremity involving the left common femoral vein, profunda femoral vein, femoral vein, popliteal vein and deep calf veins along with superficial venous thrombosis involving a segment of the left basilic vein from just above the elbow into the mid upper arm.  Patient was started on Eliquis for DVT management and treatment.  Recommended outpatient vascular surgery follow-up 2 weeks post discharge for repeat venous duplex.  Patient was discharged back to [facility] for continued rehab in stable condition.      ROS:   14 system review of systems performed and negative with exception of weakness, easy bruising and dizziness  PMH:  Past Medical History:  Diagnosis Date  . CAD (coronary artery disease)   . Carpal tunnel syndrome   . GERD (gastroesophageal reflux disease)   . History of shingles   . Hormone receptor positive breast cancer ([place])   . Hyperlipemia   . Hypertension   . Stroke [facility])     PSH:  Past Surgical History:  Procedure Laterality Date  . ABDOMINAL HYSTERECTOMY    . APPENDECTOMY    . cornoary angioplasty    . ENDOSCOPIC RETROGRADE CHOLANGIOPANCREATOGRAPHY (ERCP) WITH PROPOFOL N/A 11/10/2017   Procedure: ENDOSCOPIC RETROGRADE CHOLANGIOPANCREATOGRAPHY (ERCP) WITH PROPOFOL;  Surgeon: Lucilla Lame, MD;  Location: [facility] ENDOSCOPY;  Service: Endoscopy;  Laterality: N/A;  . ESOPHAGOGASTRODUODENOSCOPY (EGD) WITH PROPOFOL N/A 11/06/2017   Procedure: ESOPHAGOGASTRODUODENOSCOPY (EGD) WITH PROPOFOL;  Surgeon: Lucilla Lame, MD;  Location: [facility] ENDOSCOPY;  Service: Endoscopy;  Laterality: N/A;    Social History:  Social History   Socioeconomic History  . Marital status: Married    Spouse name: Not on file  . Number of children: Not on file  . Years of education: Not on file  . Highest education level: Not on file  Occupational History  . Not on file  Social Needs  . Financial resource strain: Not on file  . Food insecurity:    Worry: Not on  file    Inability: Not on file  . Transportation needs:    Medical: Not on file    Non-medical: Not on file  Tobacco Use  . Smoking status: Former Smoker    Packs/day: 0.50    Years: 20.00    Pack years: 10.00    Types: Cigarettes    Last attempt to quit: 02/17/1998    Years since quitting: 20.0  . Smokeless tobacco: Never Used  Substance and Sexual Activity  . Alcohol use: Not Currently  . Drug use: Never  . Sexual activity: Not on file  Lifestyle  . Physical activity:    Days per week: Not on file    Minutes per session: Not on file  . Stress: Not on file  Relationships  . Social connections:    Talks on phone: Not on file    Gets together: Not on file    Attends religious service: Not on file    Active member of club or organization: Not on file    Attends meetings of clubs or organizations: Not on file    Relationship status: Not on file  . Intimate partner violence:    Fear of current or ex partner:  Not on file    Emotionally abused: Not on file    Physically abused: Not on file    Forced sexual activity: Not on file  Other Topics Concern  . Not on file  Social History Narrative  . Not on file    Family History:  Family History  Problem Relation Age of Onset  . Hypertension Son   . Ovarian cancer Mother   . Stroke Father   . Stroke Son     Medications:   Current Outpatient Medications on File Prior to Visit  Medication Sig Dispense Refill  . acetaminophen (TYLENOL) 325 MG tablet Take 2 tablets (650 mg total) by mouth every 4 (four) hours as needed for mild pain (or temp > 37.5 C (99.5 F)). (Patient not taking: Reported on 02/21/2018) 30 tablet 3  . amLODipine (NORVASC) 10 MG tablet Take 1 tablet (10 mg total) by mouth daily. 30 tablet 0  . apixaban (ELIQUIS) 5 MG TABS tablet Take 1 tablet (5 mg total) by mouth 2 (two) times daily. Two tablets twice a day through 11/17/2017 then one tablet twice a day starting 11/18/2017 60 tablet 0  . aspirin EC 81 MG EC tablet  Take 1 tablet (81 mg total) by mouth daily. 180 tablet 0  . b complex vitamins tablet Take 1 tablet by mouth daily.    . bisacodyl (DUCODYL) 5 MG EC tablet Take 2 tablets (10 mg total) by mouth daily. 30 tablet 0  . Cholecalciferol (VITAMIN D3) 1000 units CAPS Take 3,000 Units by mouth at bedtime.     . cloNIDine (CATAPRES) 0.2 MG tablet Take 1 tablet (0.2 mg total) by mouth 2 (two) times daily. 60 tablet 3  . famotidine (PEPCID) 20 MG tablet Take 1 tablet (20 mg total) by mouth 2 (two) times daily. (Patient not taking: Reported on 02/21/2018) 60 tablet 0  . hydrochlorothiazide (MICROZIDE) 12.5 MG capsule Take 1 capsule (12.5 mg total) by mouth daily. 30 capsule 0  . lansoprazole (PREVACID) 15 MG capsule Take 15 mg by mouth at bedtime.     Marland Kitchen levothyroxine (SYNTHROID, LEVOTHROID) 25 MCG tablet Take 25 mcg by mouth daily before breakfast.     . magic mouthwash SOLN Take 5 mLs by mouth 3 (three) times daily. Swish and spit    . Melatonin 3 MG TABS Take 3 mg by mouth at bedtime as needed.    . nystatin (MYCOSTATIN) 100000 UNIT/ML suspension Take 5 mLs by mouth 4 (four) times daily.    . ondansetron (ZOFRAN-ODT) 4 MG disintegrating tablet Take 1 tablet (4 mg total) by mouth every 8 (eight) hours as needed for nausea or vomiting. 20 tablet 0  . oxyCODONE (OXY IR/ROXICODONE) 5 MG immediate release tablet Take 1 tablet (5 mg total) by mouth every 6 (six) hours as needed for severe pain. 15 tablet 0  . polyethylene glycol (MIRALAX / GLYCOLAX) packet Take 17 g by mouth daily as needed for moderate constipation. 30 each 0  . senna-docusate (SENOKOT-S) 8.6-50 MG tablet Take 2 tablets by mouth at bedtime. 60 tablet 2  . simvastatin (ZOCOR) 40 MG tablet Take 1 tablet (40 mg total) by mouth daily at 6 PM. 30 tablet 0  . venlafaxine XR (EFFEXOR-XR) 150 MG 24 hr capsule Take 150 mg by mouth at bedtime.     No current facility-administered medications on file prior to visit.     Allergies:   Allergies  Allergen  Reactions  . Clopidogrel Nausea And Vomiting  reported by [place] 11/24/13  . Omeprazole Other (See Comments)    Unknown reaction - reported by [place] 11/24/13     Physical Exam  There were no vitals filed for this visit. There is no height or weight on file to calculate BMI. No exam data present  General: well developed, well nourished, pleasant elderly Caucasian female, seated, in no evident distress Head: head normocephalic and atraumatic.   Neck: supple with no carotid or supraclavicular bruits Cardiovascular: regular rate and rhythm, no murmurs Musculoskeletal: no deformity Skin:  no rash/petichiae Vascular:  Normal pulses all extremities  Neurologic Exam Mental Status: Awake and fully alert. Oriented to place and time. Recent and remote memory intact. Attention span, concentration and fund of knowledge appropriate. Mood and affect appropriate.  Cranial Nerves: Fundoscopic exam reveals sharp disc margins. Pupils equal, briskly reactive to light. Extraocular movements full without nystagmus. Visual fields full to confrontation. Hearing intact. Facial sensation intact.  Left lower facial paralysis. Motor: Normal bulk and tone.  LUE: 2/5 shoulder shrug, 0/5 rest of arm; LLE: 2/5 Sensory.: intact to touch , pinprick , position and vibratory sensation.  Coordination: Rapid alternating movements normal on right side.  Finger-to-nose and heel-to-shin performed accurately on the right side. Gait and Station: Patient is wheelchair-bound at this time therefore gait assessment deferred Reflexes: 2+ and symmetric. Toes downgoing.    NIHSS  7 Modified Rankin  4    Diagnostic Data (Labs, Imaging, Testing)  CT HEAD WO CONTRAST 10/12/2017 IMPRESSION: No acute intracranial abnormality. Atrophy, chronic microvascular disease  MR BRAIN WO CONTRAST 10/13/2017 IMPRESSION: 1. Acute/early subacute infarction involving the right  posterior insula, frontal operculum, and posterolateral frontal lobe. No hemorrhage or mass effect. 2. 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 3. Mild for age chronic microvascular ischemic changes and volume loss of the brain.  CT ANGIO HEAD W OR WO CONTRAST CT ANGIO NECK W OR WO CONTRAST 10/13/2017 IMPRESSION: 1. Right mid to distal M2 branch occlusion within mid sylvian fissure. 2. No additional intracranial large vessel occlusion, aneurysm, or significant stenosis. 3. Bilateral vertebral artery origin severe stenosis with calcified plaque. 4. Right proximal ICA mild less than 50% stenosis with calcified plaque. 5. Left proximal ICA moderate 50-70% stenosis with calcified plaque. 6. Left subclavian artery severe greater than 70% proximal stenosis with calcified plaque.  CT HEAD WO CONTRAST 10/14/2017 IMPRESSION: 1. Evolving moderate sized acute to early subacute ischemic right MCA territory infarct, slightly increased in size relative to previous MRI. No significant edema or evidence for hemorrhagic transformation. 2. Otherwise stable appearance of the brain. No other acute intracranial abnormality identified.  ECHOCARDIOGRAM 10/13/2017 Study Conclusions - Left ventricle: The cavity size was normal. Wall thickness was   increased in a pattern of mild LVH. Systolic function was normal.   The estimated ejection fraction was in the range of 60% to 65%.   Wall motion was normal; there were no regional wall motion   abnormalities. Features are consistent with a pseudonormal left   ventricular filling pattern, with concomitant abnormal relaxation   and increased filling pressure (grade 2 diastolic dysfunction).   E/medial e&' > 15, suggesting LV end diastolic pressure at least   20 mmHg. - Aortic valve: There was no stenosis. - Mitral valve: There was trivial regurgitation. - Left atrium: The atrium was  mildly dilated. - Right ventricle: The cavity size was normal. Systolic function  was normal. - Right atrium: The atrium was mildly dilated. - Tricuspid valve: Peak RV-RA gradient (S): 47 mm Hg. - Pulmonary arteries: PA peak pressure: 50 mm Hg (S). - Inferior vena cava: The vessel was normal in size. The   respirophasic diameter changes were in the normal range (>= 50%),   consistent with normal central venous pressure.  VAS [place] LOWER EXTREMITY VENOUS BILAT (DVT) 10/15/2017 Final Interpretation: Right: There is no evidence of deep vein thrombosis in the lower extremity. No cystic structure found in the popliteal fossa. Left: There is no evidence of deep vein thrombosis in the lower extremity. However, portions of this examination were limited- see technologist comments above. No cystic structure found in the popliteal fossa.  US venous img lower unilateral left 11/11/2017 IMPRESSION: Positive for deep venous thrombosis in the left lower extremity. Thrombus involving the left common femoral vein, profunda femoral vein, femoral vein, popliteal vein and deep calf veins.  US venous img upper unilateral left 11/12/2017 IMPRESSION: Positive for superficial venous thrombosis involving a segment of the left basilic vein from just above the elbow into the mid upper arm.   ASSESSMENT: Sonya [name] is a 85 y.o. 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 Vascular risk factors include HTN, HLD, CKD.  After hospital discharge, patient return to ED where she underwent ERCP on 11/10/2017 due to choledocholithiasis along with left lower extremity DVT and superficial right upper extremity DVT and was started on Eliquis.  Patient is being seen today for hospital follow-up and does continue to have left hemiplegia/hemiparesis but has been making improvements with  therapy.    PLAN: -Continue aspirin 81 mg daily and Eliquis (apixaban) daily  and Zocor for secondary stroke prevention -Order placed for TCD with bubble study (placed by Dr. Leonie Man) to assess for possible PFO with recent finding of DVT for possible cause of stroke -F/U with vascular surgery as scheduled on 12/03/2017 for follow-up of DVT -f/u with cardiology as scheduled on 12/09/2017 -F/u with PCP regarding your HLD and HTN management -Continue PT/OT/ST at facility for continued deficits -continue to monitor BP at home -advised to continue to stay active and maintain a healthy diet -Maintain strict control of hypertension with blood pressure goal between 1 30-1 50, diabetes with hemoglobin A1c goal below 6.5% and cholesterol with LDL cholesterol (bad cholesterol) goal below 70 mg/dL. I also advised the patient to eat a healthy diet with plenty of whole grains, cereals, fruits and vegetables, exercise regularly and maintain ideal body weight.  Follow up in 3 months or call earlier if needed   Greater than 50% of time during this 25 minute visit was spent on counseling,explanation of diagnosis of right MCA infarct, reviewing risk factor management of HTN, HLD, CKD, DVT and left hemiparesis/plegia, planning of further management, discussion with patient and family and coordination of care    Venancio Poisson, AGNP-BC  [facility] [address] [place], [place] [address]  Phone [phone] Fax [phone] Note: This document was prepared with digital dictation and possible smart phrase technology. Any transcriptional errors that result from this process are unintentional.

## 2018-03-08 ENCOUNTER — Encounter: Payer: Self-pay | Admitting: Adult Health

## 2018-03-10 ENCOUNTER — Encounter: Payer: Medicare PPO | Admitting: Student

## 2018-03-15 DIAGNOSIS — G629 Polyneuropathy, unspecified: Secondary | ICD-10-CM | POA: Insufficient documentation

## 2018-03-22 ENCOUNTER — Inpatient Hospital Stay
Admission: EM | Admit: 2018-03-22 | Discharge: 2018-03-24 | DRG: 377 | Disposition: A | Discharge status: Home Health | Payer: Medicare PPO | Attending: Internal Medicine | Admitting: Internal Medicine

## 2018-03-22 ENCOUNTER — Emergency Department: Payer: Medicare PPO

## 2018-03-22 ENCOUNTER — Encounter: Payer: Self-pay | Admitting: Emergency Medicine

## 2018-03-22 ENCOUNTER — Other Ambulatory Visit: Payer: Self-pay

## 2018-03-22 DIAGNOSIS — Z7901 Long term (current) use of anticoagulants: Secondary | ICD-10-CM | POA: Diagnosis not present

## 2018-03-22 DIAGNOSIS — K449 Diaphragmatic hernia without obstruction or gangrene: Secondary | ICD-10-CM | POA: Diagnosis present

## 2018-03-22 DIAGNOSIS — I2510 Atherosclerotic heart disease of native coronary artery without angina pectoris: Secondary | ICD-10-CM | POA: Diagnosis present

## 2018-03-22 DIAGNOSIS — E876 Hypokalemia: Secondary | ICD-10-CM | POA: Diagnosis present

## 2018-03-22 DIAGNOSIS — K920 Hematemesis: Secondary | ICD-10-CM | POA: Diagnosis present

## 2018-03-22 DIAGNOSIS — I10 Essential (primary) hypertension: Secondary | ICD-10-CM | POA: Diagnosis present

## 2018-03-22 DIAGNOSIS — R571 Hypovolemic shock: Secondary | ICD-10-CM | POA: Diagnosis present

## 2018-03-22 DIAGNOSIS — Z90710 Acquired absence of both cervix and uterus: Secondary | ICD-10-CM | POA: Diagnosis not present

## 2018-03-22 DIAGNOSIS — K31811 Angiodysplasia of stomach and duodenum with bleeding: Principal | ICD-10-CM

## 2018-03-22 DIAGNOSIS — Z823 Family history of stroke: Secondary | ICD-10-CM | POA: Diagnosis not present

## 2018-03-22 DIAGNOSIS — N179 Acute kidney failure, unspecified: Secondary | ICD-10-CM | POA: Diagnosis present

## 2018-03-22 DIAGNOSIS — Z8041 Family history of malignant neoplasm of ovary: Secondary | ICD-10-CM | POA: Diagnosis not present

## 2018-03-22 DIAGNOSIS — Z8249 Family history of ischemic heart disease and other diseases of the circulatory system: Secondary | ICD-10-CM

## 2018-03-22 DIAGNOSIS — Z888 Allergy status to other drugs, medicaments and biological substances status: Secondary | ICD-10-CM

## 2018-03-22 DIAGNOSIS — I482 Chronic atrial fibrillation, unspecified: Secondary | ICD-10-CM | POA: Diagnosis present

## 2018-03-22 DIAGNOSIS — E039 Hypothyroidism, unspecified: Secondary | ICD-10-CM | POA: Diagnosis present

## 2018-03-22 DIAGNOSIS — Z86718 Personal history of other venous thrombosis and embolism: Secondary | ICD-10-CM

## 2018-03-22 DIAGNOSIS — F329 Major depressive disorder, single episode, unspecified: Secondary | ICD-10-CM | POA: Diagnosis present

## 2018-03-22 DIAGNOSIS — Z79890 Hormone replacement therapy (postmenopausal): Secondary | ICD-10-CM

## 2018-03-22 DIAGNOSIS — E785 Hyperlipidemia, unspecified: Secondary | ICD-10-CM | POA: Diagnosis present

## 2018-03-22 DIAGNOSIS — N390 Urinary tract infection, site not specified: Secondary | ICD-10-CM | POA: Diagnosis present

## 2018-03-22 DIAGNOSIS — Z87891 Personal history of nicotine dependence: Secondary | ICD-10-CM

## 2018-03-22 DIAGNOSIS — G8929 Other chronic pain: Secondary | ICD-10-CM | POA: Diagnosis present

## 2018-03-22 DIAGNOSIS — Z853 Personal history of malignant neoplasm of breast: Secondary | ICD-10-CM

## 2018-03-22 DIAGNOSIS — E860 Dehydration: Secondary | ICD-10-CM

## 2018-03-22 DIAGNOSIS — I69354 Hemiplegia and hemiparesis following cerebral infarction affecting left non-dominant side: Secondary | ICD-10-CM | POA: Diagnosis not present

## 2018-03-22 DIAGNOSIS — Z7982 Long term (current) use of aspirin: Secondary | ICD-10-CM | POA: Diagnosis not present

## 2018-03-22 DIAGNOSIS — K922 Gastrointestinal hemorrhage, unspecified: Secondary | ICD-10-CM

## 2018-03-22 DIAGNOSIS — K3189 Other diseases of stomach and duodenum: Secondary | ICD-10-CM | POA: Diagnosis not present

## 2018-03-22 DIAGNOSIS — K219 Gastro-esophageal reflux disease without esophagitis: Secondary | ICD-10-CM | POA: Diagnosis present

## 2018-03-22 DIAGNOSIS — L89020 Pressure ulcer of left elbow, unstageable: Secondary | ICD-10-CM | POA: Diagnosis present

## 2018-03-22 DIAGNOSIS — Z9861 Coronary angioplasty status: Secondary | ICD-10-CM

## 2018-03-22 DIAGNOSIS — L89890 Pressure ulcer of other site, unstageable: Secondary | ICD-10-CM | POA: Diagnosis present

## 2018-03-22 LAB — CBC WITH DIFFERENTIAL/PLATELET
Abs Immature Granulocytes: 0.11 10*3/uL — ABNORMAL HIGH (ref 0.00–0.07)
Basophils Absolute: 0 10*3/uL (ref 0.0–0.1)
Basophils Relative: 0 %
Eosinophils Absolute: 0 10*3/uL (ref 0.0–0.5)
Eosinophils Relative: 0 %
HEMATOCRIT: 31.7 % — AB (ref 36.0–46.0)
Hemoglobin: 10 g/dL — ABNORMAL LOW (ref 12.0–15.0)
Immature Granulocytes: 1 %
Lymphocytes Relative: 10 %
Lymphs Abs: 1.9 10*3/uL (ref 0.7–4.0)
MCH: 26.5 pg (ref 26.0–34.0)
MCHC: 31.5 g/dL (ref 30.0–36.0)
MCV: 83.9 fL (ref 80.0–100.0)
Monocytes Absolute: 0.9 10*3/uL (ref 0.1–1.0)
Monocytes Relative: 5 %
NRBC: 0 % (ref 0.0–0.2)
Neutro Abs: 16.3 10*3/uL — ABNORMAL HIGH (ref 1.7–7.7)
Neutrophils Relative %: 84 %
Platelets: 430 10*3/uL — ABNORMAL HIGH (ref 150–400)
RBC: 3.78 MIL/uL — ABNORMAL LOW (ref 3.87–5.11)
RDW: 15.1 % (ref 11.5–15.5)
WBC: 19.3 10*3/uL — ABNORMAL HIGH (ref 4.0–10.5)

## 2018-03-22 LAB — URINALYSIS, COMPLETE (UACMP) WITH MICROSCOPIC
Bacteria, UA: NONE SEEN
Bilirubin Urine: NEGATIVE
GLUCOSE, UA: NEGATIVE mg/dL
Ketones, ur: NEGATIVE mg/dL
Nitrite: NEGATIVE
Protein, ur: NEGATIVE mg/dL
Specific Gravity, Urine: 1.01 (ref 1.005–1.030)
pH: 6 (ref 5.0–8.0)

## 2018-03-22 LAB — COMPREHENSIVE METABOLIC PANEL
ALT: 15 U/L (ref 0–44)
AST: 25 U/L (ref 15–41)
Albumin: 3 g/dL — ABNORMAL LOW (ref 3.5–5.0)
Alkaline Phosphatase: 55 U/L (ref 38–126)
Anion gap: 9 (ref 5–15)
BUN: 36 mg/dL — AB (ref 8–23)
CO2: 28 mmol/L (ref 22–32)
Calcium: 9.4 mg/dL (ref 8.9–10.3)
Chloride: 103 mmol/L (ref 98–111)
Creatinine, Ser: 1.72 mg/dL — ABNORMAL HIGH (ref 0.44–1.00)
GFR calc Af Amer: 31 mL/min — ABNORMAL LOW (ref >60)
GFR calc non Af Amer: 27 mL/min — ABNORMAL LOW (ref >60)
Glucose, Bld: 151 mg/dL — ABNORMAL HIGH (ref 70–99)
Potassium: 4.1 mmol/L (ref 3.5–5.1)
Sodium: 140 mmol/L (ref 135–145)
Total Bilirubin: 0.6 mg/dL (ref 0.3–1.2)
Total Protein: 6.2 g/dL — ABNORMAL LOW (ref 6.5–8.1)

## 2018-03-22 LAB — HEMOGLOBIN AND HEMATOCRIT, BLOOD
HEMATOCRIT: 27.3 % — AB (ref 36.0–46.0)
Hemoglobin: 8.7 g/dL — ABNORMAL LOW (ref 12.0–15.0)

## 2018-03-22 LAB — ABO/RH: ABO/RH(D): A POS

## 2018-03-22 LAB — PREPARE RBC (CROSSMATCH)

## 2018-03-22 LAB — LIPASE, BLOOD: LIPASE: 34 U/L (ref 11–51)

## 2018-03-22 LAB — ETHANOL: Alcohol, Ethyl (B): <10 mg/dL (ref <10)

## 2018-03-22 LAB — PROTIME-INR
INR: 1.57
Prothrombin Time: 18.6 seconds — ABNORMAL HIGH (ref 11.4–15.2)

## 2018-03-22 LAB — APTT: aPTT: 37 seconds — ABNORMAL HIGH (ref 24–36)

## 2018-03-22 LAB — HEMOGLOBIN: Hemoglobin: 7.9 g/dL — ABNORMAL LOW (ref 12.0–15.0)

## 2018-03-22 MED ORDER — FAMOTIDINE IN NACL 20-0.9 MG/50ML-% IV SOLN
20.0000 mg | Freq: Once | INTRAVENOUS | Status: AC
  Administered 2018-03-22: 20 mg via INTRAVENOUS
  Filled 2018-03-22: qty 50

## 2018-03-22 MED ORDER — MELATONIN 5 MG PO TABS
2.5000 mg | ORAL_TABLET | Freq: Every evening | ORAL | Status: DC | PRN
  Administered 2018-03-23: 2.5 mg via ORAL
  Filled 2018-03-22 (×2): qty 0.5

## 2018-03-22 MED ORDER — ONDANSETRON HCL 4 MG PO TABS
4.0000 mg | ORAL_TABLET | Freq: Four times a day (QID) | ORAL | Status: DC | PRN
  Administered 2018-03-23: 4 mg via ORAL
  Filled 2018-03-22: qty 1

## 2018-03-22 MED ORDER — ONDANSETRON HCL 4 MG/2ML IJ SOLN
4.0000 mg | Freq: Once | INTRAMUSCULAR | Status: AC
  Administered 2018-03-22: 4 mg via INTRAVENOUS
  Filled 2018-03-22: qty 2

## 2018-03-22 MED ORDER — SODIUM CHLORIDE 0.9 % IV BOLUS
1000.0000 mL | Freq: Once | INTRAVENOUS | Status: AC
  Administered 2018-03-22: 1000 mL via INTRAVENOUS

## 2018-03-22 MED ORDER — BISACODYL 5 MG PO TBEC: 10.0000 mg | DELAYED_RELEASE_TABLET | Freq: Every day | ORAL | Status: DC

## 2018-03-22 MED ORDER — ONDANSETRON 4 MG PO TBDP
4.0000 mg | ORAL_TABLET | Freq: Three times a day (TID) | ORAL | Status: DC | PRN
  Administered 2018-03-22: 4 mg via ORAL
  Filled 2018-03-22: qty 1

## 2018-03-22 MED ORDER — MELATONIN 3 MG PO TABS
3.0000 mg | ORAL_TABLET | Freq: Every evening | ORAL | Status: DC | PRN
  Filled 2018-03-22: qty 1

## 2018-03-22 MED ORDER — ACETAMINOPHEN 325 MG PO TABS
650.0000 mg | ORAL_TABLET | Freq: Four times a day (QID) | ORAL | Status: DC | PRN
  Administered 2018-03-23: 650 mg via ORAL
  Filled 2018-03-22: qty 2

## 2018-03-22 MED ORDER — ACETAMINOPHEN 650 MG RE SUPP: 650.0000 mg | Freq: Four times a day (QID) | RECTAL | Status: DC | PRN

## 2018-03-22 MED ORDER — PANTOPRAZOLE SODIUM 20 MG PO TBEC: 20.0000 mg | DELAYED_RELEASE_TABLET | Freq: Every day | ORAL | Status: DC

## 2018-03-22 MED ORDER — MORPHINE SULFATE (PF) 4 MG/ML IV SOLN
4.0000 mg | Freq: Once | INTRAVENOUS | Status: AC
  Administered 2018-03-22: 4 mg via INTRAVENOUS
  Filled 2018-03-22: qty 1

## 2018-03-22 MED ORDER — ONDANSETRON HCL 4 MG/2ML IJ SOLN
4.0000 mg | Freq: Four times a day (QID) | INTRAMUSCULAR | Status: DC | PRN
  Administered 2018-03-23: 4 mg via INTRAVENOUS
  Filled 2018-03-22: qty 2

## 2018-03-22 MED ORDER — VENLAFAXINE HCL ER 75 MG PO CP24
150.0000 mg | ORAL_CAPSULE | Freq: Every day | ORAL | Status: DC
  Administered 2018-03-23: 150 mg via ORAL
  Filled 2018-03-22 (×2): qty 2

## 2018-03-22 MED ORDER — SODIUM CHLORIDE 0.9 % IV SOLN
8.0000 mg/h | INTRAVENOUS | Status: DC
  Administered 2018-03-22 – 2018-03-24 (×4): 8 mg/h via INTRAVENOUS
  Filled 2018-03-22 (×4): qty 80

## 2018-03-22 MED ORDER — LEVOTHYROXINE SODIUM 50 MCG PO TABS
25.0000 ug | ORAL_TABLET | Freq: Every day | ORAL | Status: DC
  Administered 2018-03-24: 25 ug via ORAL
  Filled 2018-03-22: qty 1

## 2018-03-22 MED ORDER — PANTOPRAZOLE SODIUM 40 MG IV SOLR: 40.0000 mg | Freq: Two times a day (BID) | INTRAVENOUS | Status: DC

## 2018-03-22 MED ORDER — POLYETHYLENE GLYCOL 3350 17 G PO PACK: 17.0000 g | PACK | Freq: Every day | ORAL | Status: DC | PRN

## 2018-03-22 MED ORDER — SODIUM CHLORIDE 0.9 % IV SOLN: 10.0000 mL/h | Freq: Once | INTRAVENOUS | Status: DC

## 2018-03-22 MED ORDER — SODIUM CHLORIDE 0.9 % IV SOLN
INTRAVENOUS | Status: DC
  Administered 2018-03-22 – 2018-03-24 (×5): via INTRAVENOUS

## 2018-03-22 NOTE — ED Triage Notes (Signed)
PT to ER via EMS from home with c/o n/v since last night.  Reports that emesis is black.

## 2018-03-22 NOTE — ED Notes (Signed)
Called to room by admitting MD due to pt vomiting.  Found pt had vomited 200cc of black watery emesis in emesis bag and approximately 100cc of the same on her clothing and the linens.  Linen and clothing changed.  Pt helped in to hospital gown.  Warm blankets given for comfort.  Helped her rinse her mouth with cold water and gave repositioned for comfort.

## 2018-03-22 NOTE — H&P (Signed)
[facility] NAME: Sonya [name]    MR#:  [id number]  DATE OF BIRTH:  17-Apr-1932  DATE OF ADMISSION:  03/22/2018  PRIMARY CARE PHYSICIAN: Baxter Hire, MD   REQUESTING/REFERRING PHYSICIAN: Dr. Joni Fears  CHIEF COMPLAINT:   Coffee ground emesis  HISTORY OF PRESENT ILLNESS:  Sonya [name]  is a 85 y.o. female with a known history of gastritis/Gerd with last EGD done in September 2019 along with benign looking esophageal stenosis, Sonya [name], CAD, stroke and on eliquis comes to the emergency room from home with coffee ground emesis. Patient was actively having vomiting when I was evaluating her.   She blood pressure is stable. Hemoglobin is 10.0. Patient is being admitted for further evaluation of management.  She received IV famotidine. I have ordered Protonix drip.  PAST MEDICAL HISTORY:   Past Medical History:  Diagnosis Date  . CAD (coronary artery disease)   . Carpal tunnel syndrome   . GERD (gastroesophageal reflux disease)   . History of shingles   . Hormone receptor positive breast cancer ([place])   . Hyperlipemia   . Hypertension   . Stroke [facility])     PAST SURGICAL HISTOIRY:   Past Surgical History:  Procedure Laterality Date  . ABDOMINAL HYSTERECTOMY    . APPENDECTOMY    . cornoary angioplasty    . ENDOSCOPIC RETROGRADE CHOLANGIOPANCREATOGRAPHY (ERCP) WITH PROPOFOL N/A 11/10/2017   Procedure: ENDOSCOPIC RETROGRADE CHOLANGIOPANCREATOGRAPHY (ERCP) WITH PROPOFOL;  Surgeon: Lucilla Lame, MD;  Location: [facility] ENDOSCOPY;  Service: Endoscopy;  Laterality: N/A;  . ESOPHAGOGASTRODUODENOSCOPY (EGD) WITH PROPOFOL N/A 11/06/2017   Procedure: ESOPHAGOGASTRODUODENOSCOPY (EGD) WITH PROPOFOL;  Surgeon: Lucilla Lame, MD;  Location: [facility] ENDOSCOPY;  Service: Endoscopy;  Laterality: N/A;    SOCIAL HISTORY:   Social History   Tobacco Use  . Smoking status: Former Smoker    Packs/day: 0.50    Years: 20.00    Pack years:  10.00    Types: Cigarettes    Last attempt to quit: 02/17/1998    Years since quitting: 20.1  . Smokeless tobacco: Never Used  Substance Use Topics  . Alcohol use: Not Currently    FAMILY HISTORY:   Family History  Problem Relation Age of Onset  . Hypertension Son   . Ovarian cancer Mother   . Stroke Father   . Stroke Son     DRUG ALLERGIES:   Allergies  Allergen Reactions  . Clopidogrel Nausea And Vomiting     reported by [place] 11/24/13  . Omeprazole Other (See Comments)    Unknown reaction - reported by [place] 11/24/13    REVIEW OF SYSTEMS:  Review of Systems  Constitutional: Negative for chills, fever and weight loss.  HENT: Negative for ear discharge, ear pain and nosebleeds.   Eyes: Negative for blurred vision, pain and discharge.  Respiratory: Negative for sputum production, shortness of breath, wheezing and stridor.   Cardiovascular: Negative for chest pain, palpitations, orthopnea and PND.  Gastrointestinal: Positive for vomiting. Negative for abdominal pain, diarrhea and nausea.  Genitourinary: Negative for frequency and urgency.  Musculoskeletal: Negative for back pain and joint pain.  Neurological: Negative for sensory change, speech change, focal weakness and weakness.  Psychiatric/Behavioral: Negative for depression and hallucinations. The patient is not nervous/anxious.      MEDICATIONS AT HOME:   Prior to Admission medications   Medication Sig Start Date End Date Taking? Authorizing Provider  amLODipine (NORVASC) 10 MG tablet  Take 1 tablet (10 mg total) by mouth daily. 02/24/18  Yes Ojie, Jude, MD  apixaban (ELIQUIS) 5 MG TABS tablet Take 1 tablet (5 mg total) by mouth 2 (two) times daily. Two tablets twice a day through 11/17/2017 then one tablet twice a day starting 11/18/2017 02/24/18  Yes Ojie, Jude, MD  aspirin EC 81 MG EC tablet Take 1 tablet (81 mg total) by mouth daily. 11/04/17  Yes Salary, Montell D, MD  b  complex vitamins tablet Take 1 tablet by mouth daily.   Yes [provider]  bisacodyl (DUCODYL) 5 MG EC tablet Take 2 tablets (10 mg total) by mouth daily. 02/24/18  Yes Ojie, Jude, MD  Cholecalciferol (VITAMIN D3) 1000 units CAPS Take 3,000 Units by mouth at bedtime.  09/07/17  Yes [provider]  cloNIDine (CATAPRES) 0.2 MG tablet Take 1 tablet (0.2 mg total) by mouth 2 (two) times daily. Patient taking differently: Take 0.1 mg by mouth 2 (two) times daily.  10/15/17  Yes Emokpae, Courage, MD  gabapentin (NEURONTIN) 100 MG capsule Take 100 mg by mouth 3 (three) times daily. 03/15/18 03/15/19 Yes [provider]  lansoprazole (PREVACID) 15 MG capsule Take 15 mg by mouth at bedtime.    Yes [provider]  levothyroxine (SYNTHROID, LEVOTHROID) 25 MCG tablet Take 25 mcg by mouth daily before breakfast.  09/10/17  Yes [provider]  Melatonin 3 MG TABS Take 3 mg by mouth at bedtime as needed.   Yes [provider]  ondansetron (ZOFRAN-ODT) 4 MG disintegrating tablet Take 1 tablet (4 mg total) by mouth every 8 (eight) hours as needed for nausea or vomiting. 02/24/18  Yes Ojie, Jude, MD  oxyCODONE (OXY IR/ROXICODONE) 5 MG immediate release tablet Take 1 tablet (5 mg total) by mouth every 6 (six) hours as needed for severe pain. Patient taking differently: Take 5 mg by mouth every 4 (four) hours as needed for severe pain.  11/16/17  Yes Wieting, Richard, MD  polyethylene glycol (MIRALAX / GLYCOLAX) packet Take 17 g by mouth daily as needed for moderate constipation. 11/16/17  Yes Wieting, Richard, MD  potassium chloride SA (K-DUR,KLOR-CON) 20 MEQ tablet Take 20 mEq by mouth daily. 03/15/18 03/15/19 Yes [provider]  simvastatin (ZOCOR) 40 MG tablet Take 1 tablet (40 mg total) by mouth daily at 6 PM. 02/24/18  Yes Ojie, Jude, MD  venlafaxine XR (EFFEXOR-XR) 150 MG 24 hr capsule Take 150 mg by mouth at bedtime. 09/10/17  Yes [provider]       VITAL SIGNS:  Blood pressure (!) 146/72, pulse (!) 133, temperature (!) 97 F (36.1 C), temperature source Oral, resp. rate 20, height 5' (1.524 m), weight 49.9 kg, SpO2 97 %.  PHYSICAL EXAMINATION:  GENERAL:  85 y.o.-year-old patient lying in the bed with no acute distress.  EYES: Pupils equal, round, reactive to light and accommodation. No scleral icterus. Extraocular muscles intact.  HEENT: Head atraumatic, normocephalic. Oropharynx and nasopharynx clear.  NECK:  Supple, no jugular venous distention. No thyroid enlargement, no tenderness.  LUNGS: Normal breath sounds bilaterally, no wheezing, rales,rhonchi or crepitation. No use of accessory muscles of respiration.  CARDIOVASCULAR: S1, S2 normal. No murmurs, rubs, or gallops.  ABDOMEN: Soft, nontender, nondistended. Bowel sounds present. No organomegaly or mass.  EXTREMITIES: No pedal edema, cyanosis, or clubbing.  NEUROLOGIC: Cranial nerves II through XII are intact. Muscle strength 5/5 in all extremities. Sensation intact. Gait not checked.  PSYCHIATRIC: The patient is alert and oriented x  3.  SKIN: No obvious rash, lesion, or ulcer.   LABORATORY PANEL:   CBC Recent Labs  Lab 03/22/18 0930  WBC 19.3*  HGB 10.0*  HCT 31.7*  PLT 430*   ------------------------------------------------------------------------------------------------------------------  Chemistries  Recent Labs  Lab 03/22/18 0930  NA 140  K 4.1  CL 103  CO2 28  GLUCOSE 151*  BUN 36*  CREATININE 1.72*  CALCIUM 9.4  AST 25  ALT 15  ALKPHOS 55  BILITOT 0.6   ------------------------------------------------------------------------------------------------------------------  Cardiac Enzymes No results for input(s): TROPONINI in the last 168 hours. ------------------------------------------------------------------------------------------------------------------  RADIOLOGY:  Ct Abdomen Pelvis Wo Contrast  Result Date: 03/22/2018 CLINICAL DATA:   Nausea and vomiting.  L3 ground emesis. EXAM: CT ABDOMEN AND PELVIS WITHOUT CONTRAST TECHNIQUE: Multidetector CT imaging of the abdomen and pelvis was performed following the standard protocol without IV contrast. COMPARISON:  CT abdomen pelvis dated October 30, 2017. FINDINGS: Lower chest: No acute abnormality. Unchanged ectasia of the descending thoracic aorta measuring up to 3.3 cm. Hepatobiliary: No focal liver abnormality is seen. No gallstones, gallbladder wall thickening, or biliary dilatation. Pancreas: Unremarkable. No pancreatic ductal dilatation or surrounding inflammatory changes. Spleen: Normal in size. Unchanged 1.9 cm low-density lesion in the inferior spleen. Adrenals/Urinary Tract: The adrenal glands are unremarkable. Unchanged severe left renal atrophy with multiple left-sided cysts. Unchanged moderate left hydroureteronephrosis to the level of the distal ureter. The left distal ureter remains poorly visualized and possibly atretic. Unchanged high density material within the left renal collecting system. Unchanged right renal cyst. No right-sided hydronephrosis. The bladder is unremarkable. Stomach/Bowel: Unchanged small to moderate hiatal hernia with mild distension of the lower esophagus and proximal stomach. No bowel obstruction. No bowel wall thickening or surrounding inflammatory changes. There are a few sigmoid colonic diverticula. The appendix is surgically absent. Vascular/Lymphatic: Aortic atherosclerosis. 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 No enlarged abdominal or pelvic lymph nodes. Reproductive: Status post hysterectomy. No adnexal masses. Other: No abdominal wall hernia or abnormality. No abdominopelvic ascites. No pneumoperitoneum. Musculoskeletal: No acute or significant osseous findings. Unchanged chronic T12 and L1 compression deformities. IMPRESSION: 1. Unchanged small to moderate hiatal hernia with new mild distension of the distal esophagus and proximal  stomach. No bowel obstruction. 2. 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 3. Severe aortic atherosclerosis (ICD10-I70.0). Unchanged prominent celiac and SMA origin stenoses. Electronically Signed   By: Titus Dubin M.D.   On: 03/22/2018 12:14   Dg Chest Portable 1 View  Result Date: 03/22/2018 CLINICAL DATA:  Abdominal pain and nausea. EXAM: PORTABLE CHEST 1 VIEW COMPARISON:  02/21/2018 and 10/30/2017 FINDINGS: The heart size and pulmonary vascularity are normal and the lungs are clear. Tortuosity of the thoracic aorta with extensive aortic calcification. No significant bone abnormality. Old healed fracture of the distal right clavicle. IMPRESSION: No acute abnormality. Aortic Atherosclerosis (ICD10-I70.0). Electronically Signed   By: Lorriane Shire M.D.   On: 03/22/2018 11:09    EKG:    IMPRESSION AND PLAN:   Sonya [name]  is a 85 y.o. female with a known history of gastritis/Gerd with last EGD done in September 2019 along with benign looking esophageal stenosis, Sonya [name], CAD, stroke and on eliquis comes to the emergency room from home with coffee ground emesis.  1. G.I. bleed/coffee ground emesis. -Patient on eliquis and has history of gastritis -IV Protonix drip, IV fluids -hold PO eliquis -discussed with Dr. Lafonda Mosses.I. to see patient. -  H&H Q8. Transfuse as needed  2. History of stroke patient on eliquis -will hold anticoagulation  3.Hypothyroidism continue Synthroid  4.  HL on simvastatin  discussed with patient's son   All the records are reviewed and case discussed with ED provider.   CODE STATUS: FULL  TOTAL TIME TAKING CARE OF THIS PATIENT: *50* minutes.    Fritzi Mandes M.D on 03/22/2018 at 3:22 PM  Between 7am to 6pm - Pager - [phone]  After 6pm go to [email or web address] - password [email or web address] [facility]  [facility]  Office  [phone]  CC: Primary care  physician; Baxter Hire, MD

## 2018-03-22 NOTE — ED Notes (Signed)
ED TO INPATIENT HANDOFF REPORT  ED Nurse Name and Phone #: Anderson Malta [phone]  Name/Age/Gender Sonya [name] 85 y.o. female Room/Bed: ED13A/ED13A  Code Status   Code Status: Prior  Home/SNF/Other Skilled nursing facility Patient oriented to: disoriented Is this baseline? Yes   Triage Complete: Triage complete  Chief Complaint N/V   Triage Note PT to ER via EMS from home with c/o n/v since last night.  Reports that emesis is black.   Allergies Allergies  Allergen Reactions  . Clopidogrel Nausea And Vomiting     reported by [place] 11/24/13  . Omeprazole Other (See Comments)    Unknown reaction - reported by [place] 11/24/13    Level of Care/Admitting Diagnosis ED Disposition    ED Disposition Condition [place]: [place] [100120]  Level of Care: Med-Surg [16]  Diagnosis: GI bleed [993570]  Admitting Physician: Odessa Fleming  Attending Physician: Odessa Fleming  Estimated length of stay: past midnight tomorrow  Certification:: I certify this patient will need inpatient services for at least 2 midnights  PT Class (Do Not Modify): Inpatient [101]  PT Acc Code (Do Not Modify): Private [1]       Medical/Surgery History Past Medical History:  Diagnosis Date  . CAD (coronary artery disease)   . Carpal tunnel syndrome   . GERD (gastroesophageal reflux disease)   . History of shingles   . Hormone receptor positive breast cancer ([place])   . Hyperlipemia   . Hypertension   . Stroke [facility])    Past Surgical History:  Procedure Laterality Date  . ABDOMINAL HYSTERECTOMY    . APPENDECTOMY    . cornoary angioplasty    . ENDOSCOPIC RETROGRADE CHOLANGIOPANCREATOGRAPHY (ERCP) WITH PROPOFOL N/A 11/10/2017   Procedure: ENDOSCOPIC RETROGRADE CHOLANGIOPANCREATOGRAPHY (ERCP) WITH PROPOFOL;  Surgeon: Lucilla Lame, MD;  Location: [facility] ENDOSCOPY;  Service: Endoscopy;  Laterality: N/A;   . ESOPHAGOGASTRODUODENOSCOPY (EGD) WITH PROPOFOL N/A 11/06/2017   Procedure: ESOPHAGOGASTRODUODENOSCOPY (EGD) WITH PROPOFOL;  Surgeon: Lucilla Lame, MD;  Location: [facility] ENDOSCOPY;  Service: Endoscopy;  Laterality: N/A;     IV Location/Drains/Wounds Patient Lines/Drains/Airways Status   Active Line/Drains/Airways    Name:   Placement date:   Placement time:   Site:   Days:   Peripheral IV 03/22/18 Right Antecubital   03/22/18    0852    Antecubital   less than 1   Peripheral IV 03/22/18 Right Antecubital   03/22/18    1779    Antecubital   less than 1   Pressure Injury 02/21/18 Unstageable - Full thickness tissue loss in which the base of the ulcer is covered by slough (yellow, tan, gray, green or brown) and/or eschar (tan, brown or black) in the wound bed.   02/21/18    1552     29          Intake/Output Last 24 hours No intake or output data in the 24 hours ending 03/22/18 1443  Labs/Imaging Results for orders placed or performed during the hospital encounter of 03/22/18 (from the past 48 hour(s))  Ethanol     Status: None   Collection Time: 03/22/18  9:30 AM  Result Value Ref Range   Alcohol, Ethyl (B) <10 <10 mg/dL    Comment: (NOTE) Lowest detectable limit for serum alcohol is 10 mg/dL. For medical purposes only. Performed at [facility], [address], [place], [place] [address]   CBC with Differential  Status: Abnormal   Collection Time: 03/22/18  9:30 AM  Result Value Ref Range   WBC 19.3 (H) 4.0 - 10.5 K/uL   RBC 3.78 (L) 3.87 - 5.11 MIL/uL   Hemoglobin 10.0 (L) 12.0 - 15.0 g/dL   HCT 31.7 (L) 36.0 - 46.0 %   MCV 83.9 80.0 - 100.0 fL   MCH 26.5 26.0 - 34.0 pg   MCHC 31.5 30.0 - 36.0 g/dL   RDW 15.1 11.5 - 15.5 %   Platelets 430 (H) 150 - 400 K/uL   nRBC 0.0 0.0 - 0.2 %   Neutrophils Relative % 84 %   Neutro Abs 16.3 (H) 1.7 - 7.7 K/uL   Lymphocytes Relative 10 %   Lymphs Abs 1.9 0.7 - 4.0 K/uL   Monocytes Relative 5 %   Monocytes Absolute 0.9  0.1 - 1.0 K/uL   Eosinophils Relative 0 %   Eosinophils Absolute 0.0 0.0 - 0.5 K/uL   Basophils Relative 0 %   Basophils Absolute 0.0 0.0 - 0.1 K/uL   Immature Granulocytes 1 %   Abs Immature Granulocytes 0.11 (H) 0.00 - 0.07 K/uL    Comment: Performed at [facility], [address] [place], [place] [address]  Protime-INR     Status: Abnormal   Collection Time: 03/22/18  9:30 AM  Result Value Ref Range   Prothrombin Time 18.6 (H) 11.4 - 15.2 seconds   INR 1.57     Comment: Performed at [facility], [address] [place], [place] [address]  APTT     Status: Abnormal   Collection Time: 03/22/18  9:30 AM  Result Value Ref Range   aPTT 37 (H) 24 - 36 seconds    Comment:        IF BASELINE aPTT IS ELEVATED, SUGGEST PATIENT RISK ASSESSMENT BE USED TO DETERMINE APPROPRIATE ANTICOAGULANT THERAPY. Performed at [facility], [address] [place], [place] [address]   Comprehensive metabolic panel     Status: Abnormal   Collection Time: 03/22/18  9:30 AM  Result Value Ref Range   Sodium 140 135 - 145 mmol/L   Potassium 4.1 3.5 - 5.1 mmol/L   Chloride 103 98 - 111 mmol/L   CO2 28 22 - 32 mmol/L   Glucose, Bld 151 (H) 70 - 99 mg/dL   BUN 36 (H) 8 - 23 mg/dL   Creatinine, Ser 1.72 (H) 0.44 - 1.00 mg/dL   Calcium 9.4 8.9 - 10.3 mg/dL   Total Protein 6.2 (L) 6.5 - 8.1 g/dL   Albumin 3.0 (L) 3.5 - 5.0 g/dL   AST 25 15 - 41 U/L   ALT 15 0 - 44 U/L   Alkaline Phosphatase 55 38 - 126 U/L   Total Bilirubin 0.6 0.3 - 1.2 mg/dL   GFR calc non Af Amer 27 (L) >60 mL/min   GFR calc Af Amer 31 (L) >60 mL/min   Anion gap 9 5 - 15    Comment: Performed at [facility], [address] [place], [place] [address]  Lipase, blood     Status: None   Collection Time: 03/22/18  9:30 AM  Result Value Ref Range   Lipase 34 11 - 51 U/L    Comment: Performed at [facility], [address], [place], [place] [address]  ABO/Rh     Status: None    Collection Time: 03/22/18  9:30 AM  Result Value Ref Range   ABO/RH(D)      A  POS Performed at [facility], [address] [place], [place] [address]   Type and screen [place]     Status: None (Preliminary result)   Collection Time: 03/22/18  9:51 AM  Result Value Ref Range   ABO/RH(D) A POS    Antibody Screen NEG    Sample Expiration 03/25/2018    Unit Number [id number]    Blood Component Type RED CELLS,LR    Unit division 00    Status of Unit ALLOCATED    Transfusion Status OK TO TRANSFUSE    Crossmatch Result      Compatible Performed at [facility], [address] [place], [place] [address]    Unit Number [id number]    Blood Component Type RED CELLS,LR    Unit division 00    Status of Unit ALLOCATED    Transfusion Status OK TO TRANSFUSE    Crossmatch Result Compatible   Prepare RBC     Status: None (Preliminary result)   Collection Time: 03/22/18  2:22 PM  Result Value Ref Range   Order Confirmation      ORDER PROCESSED BY BLOOD BANK Performed at [facility], [address] [place], [place] [address]    Ct Abdomen Pelvis Wo Contrast  Result Date: 03/22/2018 CLINICAL DATA:  Nausea and vomiting.  L3 ground emesis. EXAM: CT ABDOMEN AND PELVIS WITHOUT CONTRAST TECHNIQUE: Multidetector CT imaging of the abdomen and pelvis was performed following the standard protocol without IV contrast. COMPARISON:  CT abdomen pelvis dated October 30, 2017. FINDINGS: Lower chest: No acute abnormality. Unchanged ectasia of the descending thoracic aorta measuring up to 3.3 cm. Hepatobiliary: No focal liver abnormality is seen. No gallstones, gallbladder wall thickening, or biliary dilatation. Pancreas: Unremarkable. No pancreatic ductal dilatation or surrounding inflammatory changes. Spleen: Normal in size. Unchanged 1.9 cm low-density lesion in the inferior spleen. Adrenals/Urinary Tract: The adrenal glands are unremarkable.  Unchanged severe left renal atrophy with multiple left-sided cysts. Unchanged moderate left hydroureteronephrosis to the level of the distal ureter. The left distal ureter remains poorly visualized and possibly atretic. Unchanged high density material within the left renal collecting system. Unchanged right renal cyst. No right-sided hydronephrosis. The bladder is unremarkable. Stomach/Bowel: Unchanged small to moderate hiatal hernia with mild distension of the lower esophagus and proximal stomach. No bowel obstruction. No bowel wall thickening or surrounding inflammatory changes. There are a few sigmoid colonic diverticula. The appendix is surgically absent. Vascular/Lymphatic: Aortic atherosclerosis. 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 No enlarged abdominal or pelvic lymph nodes. Reproductive: Status post hysterectomy. No adnexal masses. Other: No abdominal wall hernia or abnormality. No abdominopelvic ascites. No pneumoperitoneum. Musculoskeletal: No acute or significant osseous findings. Unchanged chronic T12 and L1 compression deformities. IMPRESSION: 1. Unchanged small to moderate hiatal hernia with new mild distension of the distal esophagus and proximal stomach. No bowel obstruction. 2. 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 3. Severe aortic atherosclerosis (ICD10-I70.0). Unchanged prominent celiac and SMA origin stenoses. Electronically Signed   By: Titus Dubin M.D.   On: 03/22/2018 12:14   Dg Chest Portable 1 View  Result Date: 03/22/2018 CLINICAL DATA:  Abdominal pain and nausea. EXAM: PORTABLE CHEST 1 VIEW COMPARISON:  02/21/2018 and 10/30/2017 FINDINGS: The heart size and pulmonary vascularity are normal and the lungs are clear. Tortuosity of the thoracic aorta with extensive aortic calcification. No significant bone abnormality. Old healed fracture of the  distal right  clavicle. IMPRESSION: No acute abnormality. Aortic Atherosclerosis (ICD10-I70.0). Electronically Signed   By: Lorriane Shire M.D.   On: 03/22/2018 11:09    Pending Labs Unresulted Labs (From admission, onward)    Start     Ordered   03/22/18 1045  Urinalysis, Complete w Microscopic  ONCE - STAT,   STAT     03/22/18 1045   03/22/18 1045  Urine culture  Once,   STAT     03/22/18 1045   Signed and Held  Hemoglobin  Now then every 8 hours,   R     Signed and Held   Signed and Held  Basic metabolic panel  Tomorrow morning,   R     Signed and Held          Vitals/Pain Today's Vitals   03/22/18 1300 03/22/18 1330 03/22/18 1400 03/22/18 1415  BP: 126/61 (!) 150/75 (!) 146/72   Pulse: (!) 122 (!) 123 (!) 122 (!) 133  Resp:  (!) 25 (!) 5 20  Temp:      TempSrc:      SpO2: 96% 97% 96% 97%  Weight:      Height:      PainSc:        Isolation Precautions No active isolations  Medications Medications  pantoprazole (PROTONIX) 80 mg in sodium chloride 0.9 % 250 mL (0.32 mg/mL) infusion (has no administration in time range)  pantoprazole (PROTONIX) injection 40 mg (has no administration in time range)  0.9 %  sodium chloride infusion (has no administration in time range)  sodium chloride 0.9 % bolus 1,000 mL (0 mLs Intravenous Stopped 03/22/18 1051)  ondansetron (ZOFRAN) injection 4 mg (4 mg Intravenous Given 03/22/18 1006)  sodium chloride 0.9 % bolus 1,000 mL (0 mLs Intravenous Stopped 03/22/18 1208)  morphine 4 MG/ML injection 4 mg (4 mg Intravenous Given 03/22/18 1104)  famotidine (PEPCID) IVPB 20 mg premix (0 mg Intravenous Stopped 03/22/18 1442)  ondansetron (ZOFRAN) injection 4 mg (4 mg Intravenous Given 03/22/18 1418)    Mobility non-ambulatory Low fall risk   Focused Assessments    Recommendations: See Admitting Provider Note  Report given to:   Additional Notes:

## 2018-03-22 NOTE — Consult Note (Signed)
Cephas Darby, MD [address]  [place]  [place], [place] [address]  Main: [phone]  Fax: [phone] Pager: [phone]   Consultation  Referring Provider:     No ref. provider found Primary Care Physician:  Baxter Hire, MD Primary Gastroenterologist:  Dr. Sherri Sear         Reason for Consultation:     Coffee-ground emesis  Date of Admission:  03/22/2018 Date of Consultation:  03/22/2018         HPI:   Sonya [name] is a 85 y.o. female with history of hypertension, hypothyroidism, coronary disease, status post PCI with stent placement in 2013, stroke in 09/2017 on aspirin 81, Eliquis, choledocholithiasis status post ERCP with biliary sphincterotomy and stone extraction came from home yesterday as she had several episodes of coffee-ground emesis.  Patient lives with her son who takes care of her at home.  Patient was cooperating with PT and was tolerating p.o. well until yesterday.  She had poor appetite and started throwing up coffee-ground/black material.  This prompted her to the ER visit by EMS. Her vitals are stable in the ER, hemoglobin 10 on admission, baseline 11.6 in 02/2018.  Dropped to 8.7 on the floor.  Her BUN and creatinine are also elevated.  Patient's last dose of Eliquis was last night according to her son.  Patient appeared pale when I saw her but her vitals were stable.  She was answering questions and able to follow commands.  Patient is started on pantoprazole drip and GI is consulted for further evaluation  Patient had an EGD in 10/2017 by Dr. Allen Norris for dysphagia and was dilated at the GE junction for esophageal stenosis  NSAIDs: Aspirin 81  Antiplts/Anticoagulants/Anti thrombotics: Eliquis and aspirin 81 for history of stroke  GI Procedures:  EGD 11/06/2017 - Large hiatal hernia. - Benign-appearing esophageal stenosis. Dilated. - Gastritis. - Normal examined duodenum. - No specimens collected.  Past Medical History:  Diagnosis Date  .  CAD (coronary artery disease)   . Carpal tunnel syndrome   . GERD (gastroesophageal reflux disease)   . History of shingles   . Hormone receptor positive breast cancer ([place])   . Hyperlipemia   . Hypertension   . Stroke [facility])     Past Surgical History:  Procedure Laterality Date  . ABDOMINAL HYSTERECTOMY    . APPENDECTOMY    . cornoary angioplasty    . ENDOSCOPIC RETROGRADE CHOLANGIOPANCREATOGRAPHY (ERCP) WITH PROPOFOL N/A 11/10/2017   Procedure: ENDOSCOPIC RETROGRADE CHOLANGIOPANCREATOGRAPHY (ERCP) WITH PROPOFOL;  Surgeon: Lucilla Lame, MD;  Location: [facility] ENDOSCOPY;  Service: Endoscopy;  Laterality: N/A;  . ESOPHAGOGASTRODUODENOSCOPY (EGD) WITH PROPOFOL N/A 11/06/2017   Procedure: ESOPHAGOGASTRODUODENOSCOPY (EGD) WITH PROPOFOL;  Surgeon: Lucilla Lame, MD;  Location: [facility] ENDOSCOPY;  Service: Endoscopy;  Laterality: N/A;    Prior to Admission medications   Medication Sig Start Date End Date Taking? Authorizing Provider  amLODipine (NORVASC) 10 MG tablet Take 1 tablet (10 mg total) by mouth daily. 02/24/18  Yes Ojie, Jude, MD  apixaban (ELIQUIS) 5 MG TABS tablet Take 1 tablet (5 mg total) by mouth 2 (two) times daily. Two tablets twice a day through 11/17/2017 then one tablet twice a day starting 11/18/2017 02/24/18  Yes Ojie, Jude, MD  aspirin EC 81 MG EC tablet Take 1 tablet (81 mg total) by mouth daily. 11/04/17  Yes Salary, Montell D, MD  b complex vitamins tablet Take 1 tablet by mouth daily.   Yes [provider]  bisacodyl (DUCODYL) 5 MG EC tablet Take 2 tablets (10 mg total) by mouth daily. 02/24/18  Yes Ojie, Jude, MD  Cholecalciferol (VITAMIN D3) 1000 units CAPS Take 3,000 Units by mouth at bedtime.  09/07/17  Yes [provider]  cloNIDine (CATAPRES) 0.2 MG tablet Take 1 tablet (0.2 mg total) by mouth 2 (two) times daily. Patient taking differently: Take 0.1 mg by mouth 2 (two) times daily.  10/15/17  Yes Emokpae, Courage, MD  gabapentin (NEURONTIN) 100 MG capsule Take  100 mg by mouth 3 (three) times daily. 03/15/18 03/15/19 Yes [provider]  lansoprazole (PREVACID) 15 MG capsule Take 15 mg by mouth at bedtime.    Yes [provider]  levothyroxine (SYNTHROID, LEVOTHROID) 25 MCG tablet Take 25 mcg by mouth daily before breakfast.  09/10/17  Yes [provider]  Melatonin 3 MG TABS Take 3 mg by mouth at bedtime as needed.   Yes [provider]  ondansetron (ZOFRAN-ODT) 4 MG disintegrating tablet Take 1 tablet (4 mg total) by mouth every 8 (eight) hours as needed for nausea or vomiting. 02/24/18  Yes Ojie, Jude, MD  oxyCODONE (OXY IR/ROXICODONE) 5 MG immediate release tablet Take 1 tablet (5 mg total) by mouth every 6 (six) hours as needed for severe pain. Patient taking differently: Take 5 mg by mouth every 4 (four) hours as needed for severe pain.  11/16/17  Yes Wieting, Richard, MD  polyethylene glycol (MIRALAX / GLYCOLAX) packet Take 17 g by mouth daily as needed for moderate constipation. 11/16/17  Yes Wieting, Richard, MD  potassium chloride SA (K-DUR,KLOR-CON) 20 MEQ tablet Take 20 mEq by mouth daily. 03/15/18 03/15/19 Yes [provider]  simvastatin (ZOCOR) 40 MG tablet Take 1 tablet (40 mg total) by mouth daily at 6 PM. 02/24/18  Yes Ojie, Jude, MD  venlafaxine XR (EFFEXOR-XR) 150 MG 24 hr capsule Take 150 mg by mouth at bedtime. 09/10/17  Yes [provider]   Current Facility-Administered Medications:  .  0.9 %  sodium chloride infusion, , Intravenous, Continuous, Fritzi Mandes, MD, Last Rate: 100 mL/hr at 03/22/18 1556 .  0.9 %  sodium chloride infusion, 10 mL/hr, Intravenous, Once, Carrie Mew, MD, Stopped at 03/22/18 1554 .  acetaminophen (TYLENOL) tablet 650 mg, 650 mg, Oral, Q6H PRN **OR** acetaminophen (TYLENOL) suppository 650 mg, 650 mg, Rectal, Q6H PRN, Fritzi Mandes, MD .  Derrill Memo ON 03/23/2018] levothyroxine (SYNTHROID, LEVOTHROID) tablet 25 mcg, 25 mcg, Oral, QAC breakfast, Fritzi Mandes, MD .   Melatonin TABS 2.5 mg, 2.5 mg, Oral, QHS PRN, Fritzi Mandes, MD .  ondansetron (ZOFRAN) tablet 4 mg, 4 mg, Oral, Q6H PRN **OR** ondansetron (ZOFRAN) injection 4 mg, 4 mg, Intravenous, Q6H PRN, Fritzi Mandes, MD .  ondansetron (ZOFRAN-ODT) disintegrating tablet 4 mg, 4 mg, Oral, Q8H PRN, Fritzi Mandes, MD .  pantoprazole (PROTONIX) 80 mg in sodium chloride 0.9 % 250 mL (0.32 mg/mL) infusion, 8 mg/hr, Intravenous, Continuous, Fritzi Mandes, MD, Last Rate: 25 mL/hr at 03/22/18 1508, 8 mg/hr at 03/22/18 1508 .  [START ON 03/26/2018] pantoprazole (PROTONIX) injection 40 mg, 40 mg, Intravenous, Q12H, Fritzi Mandes, MD .  venlafaxine XR (EFFEXOR-XR) 24 hr capsule 150 mg, 150 mg, Oral, QHS, Fritzi Mandes, MD   Family History  Problem Relation Age of Onset  . Hypertension Son   . Ovarian cancer Mother   . Stroke Father   . Stroke Son      Social History   Tobacco Use  . Smoking status: Former Smoker  Packs/day: 0.50    Years: 20.00    Pack years: 10.00    Types: Cigarettes    Last attempt to quit: 02/17/1998    Years since quitting: 20.1  . Smokeless tobacco: Never Used  Substance Use Topics  . Alcohol use: Not Currently  . Drug use: Never    Allergies as of 03/22/2018 - Review Complete 03/22/2018  Allergen Reaction Noted  . Clopidogrel Nausea And Vomiting 10/12/2017  . Omeprazole Other (See Comments) 10/12/2017    Review of Systems:    All systems reviewed and negative except where noted in HPI.   Physical Exam:  Vital signs in last 24 hours: Temp:  [97 F (36.1 C)-98.8 F (37.1 C)] 98.8 F (37.1 C) (02/03 1532) Pulse Rate:  [112-133] 124 (02/03 1532) Resp:  [5-25] 18 (02/03 1532) BP: (115-157)/(61-79) 119/63 (02/03 1532) SpO2:  [96 %-100 %] 99 % (02/03 1532) Weight:  [49.9 kg] 49.9 kg (02/03 0855)   General:   Pleasant, cooperative in NAD Head:  Normocephalic and atraumatic. Eyes:   No icterus.   Conjunctiva pale. PERRLA. Ears:  Normal auditory acuity. Neck:  Supple; no masses or  thyroidomegaly Lungs: Respirations even and unlabored. Lungs clear to auscultation bilaterally.   No wheezes, crackles, or rhonchi.  Heart:  Regular rate and rhythm;  Without murmur, clicks, rubs or gallops Abdomen:  Soft, nondistended, nontender. Normal bowel sounds. No appreciable masses or hepatomegaly.  No rebound or guarding.  Rectal:  Not performed. Msk:  Symmetrical without gross deformities.  Extremities:  Without edema, cyanosis or clubbing. Neurologic:  Alert and oriented x3; weakness from prior stroke Skin:  Intact without significant lesions or rashes. Psych:  Alert and cooperative. Normal affect.  LAB RESULTS: CBC Latest Ref Rng & Units 03/22/2018 03/22/2018 02/24/2018  WBC 4.0 - 10.5 K/uL - 19.3(H) 11.5(H)  Hemoglobin 12.0 - 15.0 g/dL 8.7(L) 10.0(L) 11.6(L)  Hematocrit 36.0 - 46.0 % 27.3(L) 31.7(L) 36.6  Platelets 150 - 400 K/uL - 430(H) 366    BMET BMP Latest Ref Rng & Units 03/22/2018 02/24/2018 02/23/2018  Glucose 70 - 99 mg/dL 151(H) 121(H) 109(H)  BUN 8 - 23 mg/dL 36(H) 6(L) 5(L)  Creatinine 0.44 - 1.00 mg/dL 1.72(H) 0.54 0.45  Sodium 135 - 145 mmol/L 140 137 139  Potassium 3.5 - 5.1 mmol/L 4.1 3.5 3.5  Chloride 98 - 111 mmol/L 103 106 104  CO2 22 - 32 mmol/L 28 23 26   Calcium 8.9 - 10.3 mg/dL 9.4 8.4(L) 8.4(L)    LFT Hepatic Function Latest Ref Rng & Units 03/22/2018 02/21/2018 11/11/2017  Total Protein 6.5 - 8.1 g/dL 6.2(L) 7.2 4.4(L)  Albumin 3.5 - 5.0 g/dL 3.0(L) 3.9 2.0(L)  AST 15 - 41 U/L 25 29 23   ALT 0 - 44 U/L 15 20 10   Alk Phosphatase 38 - 126 U/L 55 76 62  Total Bilirubin 0.3 - 1.2 mg/dL 0.6 0.9 0.5  Bilirubin, Direct 0.0 - 0.2 mg/dL - - -     STUDIES: Ct Abdomen Pelvis Wo Contrast  Result Date: 03/22/2018 CLINICAL DATA:  Nausea and vomiting.  L3 ground emesis. EXAM: CT ABDOMEN AND PELVIS WITHOUT CONTRAST TECHNIQUE: Multidetector CT imaging of the abdomen and pelvis was performed following the standard protocol without IV contrast. COMPARISON:  CT abdomen  pelvis dated October 30, 2017. FINDINGS: Lower chest: No acute abnormality. Unchanged ectasia of the descending thoracic aorta measuring up to 3.3 cm. Hepatobiliary: No focal liver abnormality is seen. No gallstones, gallbladder wall thickening, or biliary dilatation. Pancreas:  Unremarkable. No pancreatic ductal dilatation or surrounding inflammatory changes. Spleen: Normal in size. Unchanged 1.9 cm low-density lesion in the inferior spleen. Adrenals/Urinary Tract: The adrenal glands are unremarkable. Unchanged severe left renal atrophy with multiple left-sided cysts. Unchanged moderate left hydroureteronephrosis to the level of the distal ureter. The left distal ureter remains poorly visualized and possibly atretic. Unchanged high density material within the left renal collecting system. Unchanged right renal cyst. No right-sided hydronephrosis. The bladder is unremarkable. Stomach/Bowel: Unchanged small to moderate hiatal hernia with mild distension of the lower esophagus and proximal stomach. No bowel obstruction. No bowel wall thickening or surrounding inflammatory changes. There are a few sigmoid colonic diverticula. The appendix is surgically absent. Vascular/Lymphatic: Aortic atherosclerosis. 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 No enlarged abdominal or pelvic lymph nodes. Reproductive: Status post hysterectomy. No adnexal masses. Other: No abdominal wall hernia or abnormality. No abdominopelvic ascites. No pneumoperitoneum. Musculoskeletal: No acute or significant osseous findings. Unchanged chronic T12 and L1 compression deformities. IMPRESSION: 1. Unchanged small to moderate hiatal hernia with new mild distension of the distal esophagus and proximal stomach. No bowel obstruction. 2. 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 3. Severe aortic atherosclerosis (ICD10-I70.0). 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 Electronically Signed   By: Titus Dubin M.D.   On: 03/22/2018 12:14   Dg Chest Portable 1 View  Result Date: 03/22/2018 CLINICAL DATA:  Abdominal pain and nausea. EXAM: PORTABLE CHEST 1 VIEW COMPARISON:  02/21/2018 and 10/30/2017 FINDINGS: The heart size and pulmonary vascularity are normal and the lungs are clear. Tortuosity of the thoracic aorta with extensive aortic calcification. No significant bone abnormality. Old healed fracture of the distal right clavicle. IMPRESSION: No acute abnormality. Aortic Atherosclerosis (ICD10-I70.0). Electronically Signed   By: Lorriane Shire M.D.   On: 03/22/2018 11:09      Impression / Plan:   Sonya [name] is a 85 y.o. female with history of stroke, coronary artery disease, choledocholithiasis status post ERCP in 10/2017 admitted with several episodes of coffee-ground emesis, AKI  Coffee-ground emesis Patient has history of hiatal hernia.  Differentials include Mallory-Weiss tear or peptic ulcer disease or Cameron ulcers or erosive esophagitis Hold Eliquis Continue pantoprazole drip Aggressive resuscitation with IV fluids N.p.o. past midnight EGD tomorrow  Discussed recommendations with patient and her son  Thank you for involving me in the care of this patient.  We will follow along with you    LOS: 0 days   Sherri Sear, MD  03/22/2018, 5:43 PM   Note: This dictation was prepared with Dragon dictation along with smaller phrase technology. Any transcriptional errors that result from this process are unintentional.

## 2018-03-22 NOTE — ED Provider Notes (Addendum)
[facility] Emergency Department Provider Note  ____________________________________________  Time seen: Approximately 1:03 PM  I have reviewed the triage vital signs and the nursing notes.   HISTORY  Chief Complaint Nausea and Emesis    HPI Sonya [name] is a 85 y.o. female with a history of CAD hypertension and strokes who comes the ED complaining of nausea and vomiting since last night.  She reports that she is seeing some black or bloody substances in the emesis.  Denies bloody stool.  She is on Eliquis for her previous strokes.  No fevers or chills.  Does have upper abdominal pain related to the vomiting and nausea.  No aggravating or alleviating factors.      Past Medical History:  Diagnosis Date  . CAD (coronary artery disease)   . Carpal tunnel syndrome   . GERD (gastroesophageal reflux disease)   . History of shingles   . Hormone receptor positive breast cancer ([place])   . Hyperlipemia   . Hypertension   . Stroke [facility])      Patient Active Problem List   Diagnosis Date Noted  . Pressure injury of skin 02/22/2018  . Decubitus ulcer of left ankle, unstageable ([place]) 02/22/2018  . Sepsis ([place]) 02/21/2018  . Calculus of common duct without obstruction   . Abnormal findings on diagnostic imaging of liver   . Problems with swallowing and mastication   . Acute gastritis without hemorrhage   . Stricture and stenosis of esophagus   . AKI (acute kidney injury) ([place]) 11/05/2017  . Nausea & vomiting 11/05/2017  . Choledocholithiasis   . Dysphasia   . Abdominal pain 10/30/2017  . Cerebral embolism with cerebral infarction 10/13/2017  . CVA (cerebral vascular accident) ([place]) 10/13/2017  . Hypertension 10/12/2017  . Hypertensive urgency 10/12/2017  . Hypothyroidism 10/12/2017  . Left-sided weakness 10/12/2017  . Elevated troponin 10/12/2017  . Unknown when suspected stroke patient was last well 10/12/2017  . History of depression 12/05/2016  .  Hypothyroidism due to acquired atrophy of thyroid 06/05/2014  . Chronic renal insufficiency 11/28/2013  . GERD (gastroesophageal reflux disease) 11/28/2013  . H/O vitamin D deficiency 11/28/2013  . History of ischemic heart disease 11/28/2013  . Hyperlipidemia 11/28/2013     Past Surgical History:  Procedure Laterality Date  . ABDOMINAL HYSTERECTOMY    . APPENDECTOMY    . cornoary angioplasty    . ENDOSCOPIC RETROGRADE CHOLANGIOPANCREATOGRAPHY (ERCP) WITH PROPOFOL N/A 11/10/2017   Procedure: ENDOSCOPIC RETROGRADE CHOLANGIOPANCREATOGRAPHY (ERCP) WITH PROPOFOL;  Surgeon: Lucilla Lame, MD;  Location: [facility] ENDOSCOPY;  Service: Endoscopy;  Laterality: N/A;  . ESOPHAGOGASTRODUODENOSCOPY (EGD) WITH PROPOFOL N/A 11/06/2017   Procedure: ESOPHAGOGASTRODUODENOSCOPY (EGD) WITH PROPOFOL;  Surgeon: Lucilla Lame, MD;  Location: [facility] ENDOSCOPY;  Service: Endoscopy;  Laterality: N/A;     Prior to Admission medications   Medication Sig Start Date End Date Taking? Authorizing Provider  amLODipine (NORVASC) 10 MG tablet Take 1 tablet (10 mg total) by mouth daily. 02/24/18  Yes Ojie, Jude, MD  apixaban (ELIQUIS) 5 MG TABS tablet Take 1 tablet (5 mg total) by mouth 2 (two) times daily. Two tablets twice a day through 11/17/2017 then one tablet twice a day starting 11/18/2017 02/24/18  Yes Ojie, Jude, MD  aspirin EC 81 MG EC tablet Take 1 tablet (81 mg total) by mouth daily. 11/04/17  Yes Salary, Montell D, MD  b complex vitamins tablet Take 1 tablet by mouth daily.   Yes [provider]  bisacodyl (DUCODYL) 5 MG EC tablet  Take 2 tablets (10 mg total) by mouth daily. 02/24/18  Yes Ojie, Jude, MD  Cholecalciferol (VITAMIN D3) 1000 units CAPS Take 3,000 Units by mouth at bedtime.  09/07/17  Yes [provider]  cloNIDine (CATAPRES) 0.2 MG tablet Take 1 tablet (0.2 mg total) by mouth 2 (two) times daily. Patient taking differently: Take 0.1 mg by mouth 2 (two) times daily.  10/15/17  Yes Emokpae, Courage,  MD  gabapentin (NEURONTIN) 100 MG capsule Take 100 mg by mouth 3 (three) times daily. 03/15/18 03/15/19 Yes [provider]  lansoprazole (PREVACID) 15 MG capsule Take 15 mg by mouth at bedtime.    Yes [provider]  levothyroxine (SYNTHROID, LEVOTHROID) 25 MCG tablet Take 25 mcg by mouth daily before breakfast.  09/10/17  Yes [provider]  Melatonin 3 MG TABS Take 3 mg by mouth at bedtime as needed.   Yes [provider]  ondansetron (ZOFRAN-ODT) 4 MG disintegrating tablet Take 1 tablet (4 mg total) by mouth every 8 (eight) hours as needed for nausea or vomiting. 02/24/18  Yes Ojie, Jude, MD  oxyCODONE (OXY IR/ROXICODONE) 5 MG immediate release tablet Take 1 tablet (5 mg total) by mouth every 6 (six) hours as needed for severe pain. Patient taking differently: Take 5 mg by mouth every 4 (four) hours as needed for severe pain.  11/16/17  Yes Wieting, Richard, MD  polyethylene glycol (MIRALAX / GLYCOLAX) packet Take 17 g by mouth daily as needed for moderate constipation. 11/16/17  Yes Wieting, Richard, MD  potassium chloride SA (K-DUR,KLOR-CON) 20 MEQ tablet Take 20 mEq by mouth daily. 03/15/18 03/15/19 Yes [provider]  simvastatin (ZOCOR) 40 MG tablet Take 1 tablet (40 mg total) by mouth daily at 6 PM. 02/24/18  Yes Ojie, Jude, MD  venlafaxine XR (EFFEXOR-XR) 150 MG 24 hr capsule Take 150 mg by mouth at bedtime. 09/10/17  Yes [provider]     Allergies Clopidogrel and Omeprazole   Family History  Problem Relation Age of Onset  . Hypertension Son   . Ovarian cancer Mother   . Stroke Father   . Stroke Son     Social History Social History   Tobacco Use  . Smoking status: Former Smoker    Packs/day: 0.50    Years: 20.00    Pack years: 10.00    Types: Cigarettes    Last attempt to quit: 02/17/1998    Years since quitting: 20.1  . Smokeless tobacco: Never Used  Substance Use Topics  . Alcohol use: Not Currently  . Drug use:  Never    Review of Systems  Constitutional:   No fever or chills.  ENT:   No sore throat. No rhinorrhea. Cardiovascular:   No chest pain or syncope. Respiratory:   No dyspnea or cough. Gastrointestinal:   Positive as above for abdominal pain and vomiting Musculoskeletal:   Negative for focal pain or swelling All other systems reviewed and are negative except as documented above in ROS and HPI.  ____________________________________________   PHYSICAL EXAM:  VITAL SIGNS: ED Triage Vitals  Enc Vitals Group     BP 03/22/18 0854 115/62     Pulse Rate 03/22/18 0854 (!) 121     Resp 03/22/18 0854 (!) 22     Temp 03/22/18 0854 (!) 97 F (36.1 C)     Temp Source 03/22/18 0854 Oral     SpO2 03/22/18 0854 100 %     Weight 03/22/18 0855 110 lb (49.9 kg)  Height 03/22/18 0855 5' (1.524 m)     Head Circumference --      Peak Flow --      Pain Score 03/22/18 0855 8     Pain Loc --      Pain Edu? --      Excl. in [employer]? --     Vital signs reviewed, nursing assessments reviewed.   Constitutional:   Alert and oriented.  Ill-appearing. Eyes:   Conjunctivae are normal. EOMI. PERRL. ENT      Head:   Normocephalic and atraumatic.      Nose:   No congestion/rhinnorhea.       Mouth/Throat:   MMM, no pharyngeal erythema. No peritonsillar mass.       Neck:   No meningismus. Full ROM. Hematological/Lymphatic/Immunilogical:   No cervical lymphadenopathy. Cardiovascular:   RRR. Symmetric bilateral radial and DP pulses.  No murmurs. Cap refill less than 2 seconds. Respiratory:   Normal respiratory effort without tachypnea/retractions. Breath sounds are clear and equal bilaterally. No wheezes/rales/rhonchi. Gastrointestinal:   Soft with mild epigastric tenderness. Non distended. There is no CVA tenderness.  No rebound, rigidity, or guarding.  Rectal exam performed with nurse at bedside, reveals melanotic stool, strongly Hemoccult positive Musculoskeletal:   Normal range of motion in all  extremities. No joint effusions.  No lower extremity tenderness.  No edema. Neurologic:   Normal speech and language.  Motor grossly intact. No acute focal neurologic deficits are appreciated.  Skin:    Skin is warm, dry and intact. No rash noted.  No petechiae, purpura, or bullae.  ____________________________________________    LABS (pertinent positives/negatives) (all labs ordered are listed, but only abnormal results are displayed) Labs Reviewed  CBC WITH DIFFERENTIAL/PLATELET - Abnormal; Notable for the following components:      Result Value   WBC 19.3 (*)    RBC 3.78 (*)    Hemoglobin 10.0 (*)    HCT 31.7 (*)    Platelets 430 (*)    Neutro Abs 16.3 (*)    Abs Immature Granulocytes 0.11 (*)    All other components within normal limits  PROTIME-INR - Abnormal; Notable for the following components:   Prothrombin Time 18.6 (*)    All other components within normal limits  APTT - Abnormal; Notable for the following components:   aPTT 37 (*)    All other components within normal limits  COMPREHENSIVE METABOLIC PANEL - Abnormal; Notable for the following components:   Glucose, Bld 151 (*)    BUN 36 (*)    Creatinine, Ser 1.72 (*)    Total Protein 6.2 (*)    Albumin 3.0 (*)    GFR calc non Af Amer 27 (*)    GFR calc Af Amer 31 (*)    All other components within normal limits  URINE CULTURE  ETHANOL  LIPASE, BLOOD  URINALYSIS, COMPLETE (UACMP) WITH MICROSCOPIC  TYPE AND SCREEN   ____________________________________________   EKG  Interpreted by me Sinus tachycardia rate 122, normal axis and intervals.  Normal QRS ST segments and T waves.  ____________________________________________    RADIOLOGY  Ct Abdomen Pelvis Wo Contrast  Result Date: 03/22/2018 CLINICAL DATA:  Nausea and vomiting.  L3 ground emesis. EXAM: CT ABDOMEN AND PELVIS WITHOUT CONTRAST TECHNIQUE: Multidetector CT imaging of the abdomen and pelvis was performed following the standard protocol  without IV contrast. COMPARISON:  CT abdomen pelvis dated October 30, 2017. FINDINGS: Lower chest: No acute abnormality. Unchanged ectasia of the descending thoracic aorta measuring  up to 3.3 cm. Hepatobiliary: No focal liver abnormality is seen. No gallstones, gallbladder wall thickening, or biliary dilatation. Pancreas: Unremarkable. No pancreatic ductal dilatation or surrounding inflammatory changes. Spleen: Normal in size. Unchanged 1.9 cm low-density lesion in the inferior spleen. Adrenals/Urinary Tract: The adrenal glands are unremarkable. Unchanged severe left renal atrophy with multiple left-sided cysts. Unchanged moderate left hydroureteronephrosis to the level of the distal ureter. The left distal ureter remains poorly visualized and possibly atretic. Unchanged high density material within the left renal collecting system. Unchanged right renal cyst. No right-sided hydronephrosis. The bladder is unremarkable. Stomach/Bowel: Unchanged small to moderate hiatal hernia with mild distension of the lower esophagus and proximal stomach. No bowel obstruction. No bowel wall thickening or surrounding inflammatory changes. There are a few sigmoid colonic diverticula. The appendix is surgically absent. Vascular/Lymphatic: Aortic atherosclerosis. 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 No enlarged abdominal or pelvic lymph nodes. Reproductive: Status post hysterectomy. No adnexal masses. Other: No abdominal wall hernia or abnormality. No abdominopelvic ascites. No pneumoperitoneum. Musculoskeletal: No acute or significant osseous findings. Unchanged chronic T12 and L1 compression deformities. IMPRESSION: 1. Unchanged small to moderate hiatal hernia with new mild distension of the distal esophagus and proximal stomach. No bowel obstruction. 2. 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 3. Severe aortic  atherosclerosis (ICD10-I70.0). Unchanged prominent celiac and SMA origin stenoses. Electronically Signed   By: Titus Dubin M.D.   On: 03/22/2018 12:14   Dg Chest Portable 1 View  Result Date: 03/22/2018 CLINICAL DATA:  Abdominal pain and nausea. EXAM: PORTABLE CHEST 1 VIEW COMPARISON:  02/21/2018 and 10/30/2017 FINDINGS: The heart size and pulmonary vascularity are normal and the lungs are clear. Tortuosity of the thoracic aorta with extensive aortic calcification. No significant bone abnormality. Old healed fracture of the distal right clavicle. IMPRESSION: No acute abnormality. Aortic Atherosclerosis (ICD10-I70.0). Electronically Signed   By: Lorriane Shire M.D.   On: 03/22/2018 11:09    ____________________________________________   PROCEDURES .Critical Care Performed by: Carrie Mew, MD Authorized by: Carrie Mew, MD   Critical care provider statement:    Critical care time (minutes):  35   Critical care time was exclusive of:  Separately billable procedures and treating other patients   Critical care was necessary to treat or prevent imminent or life-threatening deterioration of the following conditions:  Shock and circulatory failure   Critical care was time spent personally by me on the following activities:  Development of treatment plan with patient or surrogate, discussions with consultants, evaluation of patient's response to treatment, examination of patient, obtaining history from patient or surrogate, ordering and performing treatments and interventions, ordering and review of laboratory studies, ordering and review of radiographic studies, pulse oximetry, re-evaluation of patient's condition and review of old charts    ____________________________________________  DIFFERENTIAL DIAGNOSIS   Pancreatitis, upper GI bleed, peptic ulcer disease  CLINICAL IMPRESSION / ASSESSMENT AND PLAN / ED COURSE  Pertinent labs & imaging results that were available during my  care of the patient were reviewed by me and considered in my medical decision making (see chart for details).    Patient presents with nausea and vomiting, found to have upper GI bleed based on exam with melanotic stool.  Patient is on Eliquis as well.  She is tachycardic but blood pressure is okay.  Will check labs, IV fluids for hydration and hemodynamic support, plan for hospitalization.  Clinical Course as of  Mar 22 1349  Mon Mar 22, 2018  0957 PIV placed by me under [place] visualization due to multiple failed nursing attempts and necessity of obtaining blood samples.    [PS]  1045 Cbc shows acute phase reaction. No clear infectious source on exam. Will check ua, cxr. Doubt sepsis.  Leading impression is UGIB causing tachycardia, dehydartion, and AKI from volume loss.   [PS]  1751 CT scan negative for acute intra-abdominal pathology.  With reported PPI allergy, start IV Pepcid for upper GI bleed, will hospitalize for further evaluation and management.   [PS]    Clinical Course User Index [PS] Carrie Mew, MD     ----------------------------------------- 2:24 PM on 03/22/2018 -----------------------------------------  Patient had recurrent hematemesis, about 300 mL's of frank blood.  Heart rate increased as well from 1 1 5-1 2 5.  Blood pressure remained stable.  Ordered emergent transfusion of 2 units type-specific blood.  Discussed with blood bank.  ____________________________________________   FINAL CLINICAL IMPRESSION(S) / ED DIAGNOSES    Final diagnoses:  Upper GI bleed  Dehydration  Anticoagulated  Hypovolemic shock   ED Discharge Orders    None      Portions of this note were generated with dragon dictation software. Dictation errors may occur despite best attempts at proofreading.   Carrie Mew, MD 03/22/18 1352    Carrie Mew, MD 03/22/18 [phone]

## 2018-03-22 NOTE — Progress Notes (Signed)
MEWS yellow, turned RED for elevated pulse; throwing up at the time, pulse now 116, MEWS back to yellow; Hgb 7.9; On-call Hospitalist paged to report MEW score; awaiting callback. Barbaraann Faster, RN 11:48 PM 03/22/2018

## 2018-03-23 ENCOUNTER — Inpatient Hospital Stay: Payer: Medicare PPO | Admitting: Registered Nurse

## 2018-03-23 ENCOUNTER — Encounter
Admission: EM | Disposition: A | Discharge status: Home Health | Payer: Self-pay | Source: Home / Self Care | Attending: Internal Medicine

## 2018-03-23 ENCOUNTER — Encounter: Payer: Self-pay | Admitting: Anesthesiology

## 2018-03-23 DIAGNOSIS — K920 Hematemesis: Secondary | ICD-10-CM

## 2018-03-23 DIAGNOSIS — K31811 Angiodysplasia of stomach and duodenum with bleeding: Principal | ICD-10-CM

## 2018-03-23 DIAGNOSIS — K449 Diaphragmatic hernia without obstruction or gangrene: Secondary | ICD-10-CM

## 2018-03-23 HISTORY — PX: ESOPHAGOGASTRODUODENOSCOPY: SHX5428

## 2018-03-23 LAB — HEMOGLOBIN
HEMOGLOBIN: 7.6 g/dL — AB (ref 12.0–15.0)
HEMOGLOBIN: 8.3 g/dL — AB (ref 12.0–15.0)
Hemoglobin: 7.8 g/dL — ABNORMAL LOW (ref 12.0–15.0)

## 2018-03-23 LAB — BASIC METABOLIC PANEL
ANION GAP: 8 (ref 5–15)
BUN: 26 mg/dL — ABNORMAL HIGH (ref 8–23)
CO2: 26 mmol/L (ref 22–32)
Calcium: 8.3 mg/dL — ABNORMAL LOW (ref 8.9–10.3)
Chloride: 110 mmol/L (ref 98–111)
Creatinine, Ser: 0.91 mg/dL (ref 0.44–1.00)
GFR calc Af Amer: >60 mL/min (ref >60)
GFR calc non Af Amer: 58 mL/min — ABNORMAL LOW (ref >60)
Glucose, Bld: 96 mg/dL (ref 70–99)
Potassium: 2.9 mmol/L — ABNORMAL LOW (ref 3.5–5.1)
Sodium: 144 mmol/L (ref 135–145)

## 2018-03-23 LAB — MAGNESIUM: MAGNESIUM: 1.8 mg/dL (ref 1.7–2.4)

## 2018-03-23 LAB — POTASSIUM: Potassium: 3 mmol/L — ABNORMAL LOW (ref 3.5–5.1)

## 2018-03-23 SURGERY — EGD (ESOPHAGOGASTRODUODENOSCOPY)
Anesthesia: General

## 2018-03-23 MED ORDER — PROPOFOL 10 MG/ML IV BOLUS
INTRAVENOUS | Status: DC | PRN
  Administered 2018-03-23: 50 mg via INTRAVENOUS

## 2018-03-23 MED ORDER — SODIUM CHLORIDE 0.9 % IV SOLN: INTRAVENOUS | Status: DC

## 2018-03-23 MED ORDER — APIXABAN 2.5 MG PO TABS
2.5000 mg | ORAL_TABLET | Freq: Two times a day (BID) | ORAL | Status: DC
  Administered 2018-03-23 – 2018-03-24 (×2): 2.5 mg via ORAL
  Filled 2018-03-23 (×2): qty 1

## 2018-03-23 MED ORDER — MORPHINE SULFATE (PF) 2 MG/ML IV SOLN
1.0000 mg | INTRAVENOUS | Status: AC
  Administered 2018-03-23: 1 mg via INTRAVENOUS
  Filled 2018-03-23: qty 1

## 2018-03-23 MED ORDER — COLLAGENASE 250 UNIT/GM EX OINT
TOPICAL_OINTMENT | Freq: Every day | CUTANEOUS | Status: DC
  Administered 2018-03-23 – 2018-03-24 (×2): via TOPICAL
  Filled 2018-03-23: qty 30

## 2018-03-23 MED ORDER — PROPOFOL 500 MG/50ML IV EMUL
INTRAVENOUS | Status: DC | PRN
  Administered 2018-03-23: 140 ug/kg/min via INTRAVENOUS

## 2018-03-23 MED ORDER — BOOST PLUS PO LIQD
237.0000 mL | Freq: Three times a day (TID) | ORAL | Status: DC
  Administered 2018-03-23 – 2018-03-24 (×2): 237 mL via ORAL
  Filled 2018-03-23: qty 237

## 2018-03-23 MED ORDER — POTASSIUM CHLORIDE 10 MEQ/100ML IV SOLN
10.0000 meq | INTRAVENOUS | Status: AC
  Administered 2018-03-23 (×2): 10 meq via INTRAVENOUS
  Filled 2018-03-23 (×3): qty 100

## 2018-03-23 MED ORDER — POTASSIUM CHLORIDE 10 MEQ/100ML IV SOLN
10.0000 meq | Freq: Once | INTRAVENOUS | Status: AC
  Administered 2018-03-23: 10 meq via INTRAVENOUS

## 2018-03-23 MED ORDER — POTASSIUM CHLORIDE CRYS ER 20 MEQ PO TBCR
40.0000 meq | EXTENDED_RELEASE_TABLET | Freq: Once | ORAL | Status: AC
  Administered 2018-03-23: 40 meq via ORAL
  Filled 2018-03-23: qty 2

## 2018-03-23 MED ORDER — HYDROCODONE-ACETAMINOPHEN 5-325 MG PO TABS
1.0000 | ORAL_TABLET | Freq: Four times a day (QID) | ORAL | Status: DC | PRN
  Administered 2018-03-23 – 2018-03-24 (×3): 1 via ORAL
  Filled 2018-03-23 (×3): qty 1

## 2018-03-23 NOTE — Progress Notes (Signed)
[place] [facility] [place] NAME: Sonya [name]    MR#:  [id number]  DATE OF BIRTH:  September 25, 1932  SUBJECTIVE: Admitted for melena, GI bleed, for endoscopy today.  Denies abdominal pain but states that she uses ibuprofen/Advil on a regular basis for joint pains  CHIEF COMPLAINT:   Chief Complaint  Patient presents with  . Nausea  . Emesis    REVIEW OF SYSTEMS:   ROS CONSTITUTIONAL: No fever, fatigue or weakness.  EYES: No blurred or double vision.  EARS, NOSE, AND THROAT: No tinnitus or ear pain.  RESPIRATORY: No cough, shortness of breath, wheezing or hemoptysis.  CARDIOVASCULAR: No chest pain, orthopnea, edema.  GASTROINTESTINAL: No nausea, vomiting, diarrhea or abdominal pain.  GENITOURINARY: No dysuria, hematuria.  ENDOCRINE: No polyuria, nocturia,  HEMATOLOGY: No anemia, easy bruising or bleeding SKIN: No rash or lesion. MUSCULOSKELETAL: Patient has joint pains due to previous stroke NEUROLOGIC: No tingling, numbness, weakness.  PSYCHIATRY: No anxiety or depression.   DRUG ALLERGIES:   Allergies  Allergen Reactions  . Clopidogrel Nausea And Vomiting     reported by [place] 11/24/13  . Omeprazole Other (See Comments)    Unknown reaction - reported by [place] 11/24/13    VITALS:  Blood pressure (!) 155/67, pulse (!) 119, temperature 98.2 F (36.8 C), temperature source Oral, resp. rate 20, height 5' (1.524 m), weight 49.9 kg, SpO2 100 %.  PHYSICAL EXAMINATION:  GENERAL:  85 y.o.-year-old patient lying in the bed with no acute distress.  EYES: Pupils equal, round, reactive to light and accommodation. No scleral icterus. Extraocular muscles intact.  HEENT: Head atraumatic, normocephalic. Oropharynx and nasopharynx clear.  NECK:  Supple, no jugular venous distention. No thyroid enlargement, no tenderness.  LUNGS: Normal breath sounds bilaterally, no wheezing, rales,rhonchi or  crepitation. No use of accessory muscles of respiration.  CARDIOVASCULAR: S1, S2 normal. No murmurs, rubs, or gallops.  ABDOMEN: Soft, nontender, nondistended. Bowel sounds present. No organomegaly or mass.  EXTREMITIES: No pedal edema, cyanosis, or clubbing.  NEUROLOGIC: Cranial nerves II through XII are intact. Muscle strength 5/5 in all extremities. Sensation intact. Gait not checked.  PSYCHIATRIC: The patient is alert and oriented x 3.  SKIN: No obvious rash, lesion, or ulcer.    LABORATORY PANEL:   CBC Recent Labs  Lab 03/22/18 0930 03/22/18 1530  03/23/18 0739  WBC 19.3*  --   --   --   HGB 10.0* 8.7*   < > 7.8*  HCT 31.7* 27.3*  --   --   PLT 430*  --   --   --    < > = values in this interval not displayed.   ------------------------------------------------------------------------------------------------------------------  Chemistries  Recent Labs  Lab 03/22/18 0930 03/23/18 0739  NA 140 144  K 4.1 2.9*  CL 103 110  CO2 28 26  GLUCOSE 151* 96  BUN 36* 26*  CREATININE 1.72* 0.91  CALCIUM 9.4 8.3*  MG  --  1.8  AST 25  --   ALT 15  --   ALKPHOS 55  --   BILITOT 0.6  --    ------------------------------------------------------------------------------------------------------------------  Cardiac Enzymes No results for input(s): TROPONINI in the last 168 hours. ------------------------------------------------------------------------------------------------------------------  RADIOLOGY:  Ct Abdomen Pelvis Wo Contrast  Result Date: 03/22/2018 CLINICAL DATA:  Nausea and vomiting.  L3 ground emesis. EXAM: CT ABDOMEN AND PELVIS WITHOUT CONTRAST TECHNIQUE: Multidetector CT imaging of the abdomen and pelvis was performed following  the standard protocol without IV contrast. COMPARISON:  CT abdomen pelvis dated October 30, 2017. FINDINGS: Lower chest: No acute abnormality. Unchanged ectasia of the descending thoracic aorta measuring up to 3.3 cm. Hepatobiliary: No  focal liver abnormality is seen. No gallstones, gallbladder wall thickening, or biliary dilatation. Pancreas: Unremarkable. No pancreatic ductal dilatation or surrounding inflammatory changes. Spleen: Normal in size. Unchanged 1.9 cm low-density lesion in the inferior spleen. Adrenals/Urinary Tract: The adrenal glands are unremarkable. Unchanged severe left renal atrophy with multiple left-sided cysts. Unchanged moderate left hydroureteronephrosis to the level of the distal ureter. The left distal ureter remains poorly visualized and possibly atretic. Unchanged high density material within the left renal collecting system. Unchanged right renal cyst. No right-sided hydronephrosis. The bladder is unremarkable. Stomach/Bowel: Unchanged small to moderate hiatal hernia with mild distension of the lower esophagus and proximal stomach. No bowel obstruction. No bowel wall thickening or surrounding inflammatory changes. There are a few sigmoid colonic diverticula. The appendix is surgically absent. Vascular/Lymphatic: Aortic atherosclerosis. 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 No enlarged abdominal or pelvic lymph nodes. Reproductive: Status post hysterectomy. No adnexal masses. Other: No abdominal wall hernia or abnormality. No abdominopelvic ascites. No pneumoperitoneum. Musculoskeletal: No acute or significant osseous findings. Unchanged chronic T12 and L1 compression deformities. IMPRESSION: 1. Unchanged small to moderate hiatal hernia with new mild distension of the distal esophagus and proximal stomach. No bowel obstruction. 2. 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 3. Severe aortic atherosclerosis (ICD10-I70.0). Unchanged prominent celiac and SMA origin stenoses. Electronically Signed   By: Titus Dubin M.D.   On: 03/22/2018 12:14   Dg Chest Portable 1 View  Result Date:  03/22/2018 CLINICAL DATA:  Abdominal pain and nausea. EXAM: PORTABLE CHEST 1 VIEW COMPARISON:  02/21/2018 and 10/30/2017 FINDINGS: The heart size and pulmonary vascularity are normal and the lungs are clear. Tortuosity of the thoracic aorta with extensive aortic calcification. No significant bone abnormality. Old healed fracture of the distal right clavicle. IMPRESSION: No acute abnormality. Aortic Atherosclerosis (ICD10-I70.0). Electronically Signed   By: Lorriane Shire M.D.   On: 03/22/2018 11:09    EKG:   Orders placed or performed during the hospital encounter of 03/22/18  . ED EKG  . ED EKG    ASSESSMENT AND PLAN:   85 year old female with hypertension, hypothyroidism, CAD status post PCI, stent placement in 2013, history of previous stroke on aspirin, Eliquis comes from home because of several episodes of coffee-ground vomiting. 1.  GI bleed likely upper GI bleed in setting of Eliquis, NSAID use: EGD today, continue Protonix drip, hold Eliquis.  No indication for blood transfusion, continue IV fluids.  Hemoglobin stable at 7.8. 2.  Hypokalemia: Replace the potassium. 3.  UTI: Follow urine cultures, add Rocephin.  #4 depression: Continue Effexor 150 mg at bedtime 5.  History of DVT, patient has history of superficial and the deep VT, followed by vascular Dr. Delana Meyer, on Eliquis.  Patient also has left subclavian stenosis. #6 history of previous stroke with left residual weakness, patient gets pains on the left side.  Will be on Tylenol for pain control, oxycodone as well.  Discontinue ibuprofen, Advil, told the patient about that.  Possible gastritis from combination of Eliquis, and ascites so patient should stay away from NSAID's.  Reviewed records from epic care everywhere.  All the records are reviewed and case discussed with Care Management/Social Workerr. Management plans discussed with the patient,  family and they are in agreement.  CODE STATUS: full  TOTAL TIME TAKING CARE OF  THIS PATIENT: 38 minutes.   POSSIBLE D/C IN 1-2 DAYS, DEPENDING ON CLINICAL CONDITION. More than 50% time spent in counseling, coordination of care.  Epifanio Lesches M.D on 03/23/2018 at 12:08 PM  Between 7am to 6pm - Pager - [phone]  After 6pm go to [email or web address] - password [email or web address] [facility]  Office  [phone]  CC: Primary care physician; Baxter Hire, MD   Note: This dictation was prepared with Dragon dictation along with smaller phrase technology. Any transcriptional errors that result from this process are unintentional.

## 2018-03-23 NOTE — Progress Notes (Signed)
Pt alert, verbal with son at bedside. Discharge instructions provided for son. Report called in to nurse, Kathlee Nations, RN. Pt to return back to unit. No noted distress.

## 2018-03-23 NOTE — Anesthesia Postprocedure Evaluation (Signed)
Anesthesia Post Note  Patient: Sonya [name]  Procedure(s) Performed: ESOPHAGOGASTRODUODENOSCOPY (EGD) (N/A )  Patient location during evaluation: Endoscopy Anesthesia Type: General Level of consciousness: awake and alert Pain management: pain level controlled Vital Signs Assessment: post-procedure vital signs reviewed and stable Respiratory status: spontaneous breathing, nonlabored ventilation, respiratory function stable and patient connected to nasal cannula oxygen Cardiovascular status: blood pressure returned to baseline and stable Postop Assessment: no apparent nausea or vomiting Anesthetic complications: no     Last Vitals:  Vitals:   03/23/18 1323 03/23/18 1414  BP: (!) 132/54 (!) 150/53  Pulse: (!) 109 (!) 111  Resp: 20 19  Temp: (!) 36.1 C (!) 36.4 C  SpO2: 99% 98%    Last Pain:  Vitals:   03/23/18 1414  TempSrc: Oral  PainSc:                  [name]

## 2018-03-23 NOTE — Progress Notes (Signed)
Dr. Vianne Bulls notified of moderate-severe chronic pain, NPO for EGD this am; Acknowledged; Order for Morphine 1mg  X 1, IVP. Franki Alcaide K, RN7:06 AM 03/23/2018

## 2018-03-23 NOTE — Consult Note (Signed)
[place] Nurse wound consult note Reason for Consult:unstageable pressure injury to left elbow.  Unstageable pressure injury to left foot Left toe with eschar to tip Moisture associated skin damage to left gluteal fold Wound type:PRessure and moisture CVA with left hemiparesis Pressure Injury POA: Yes Measurement:L elbow:  1 cm x 1 cm 100% slough LEft foot:  1 cm x 0.5 cm 100% slough LEft toe tip:  0.3 cm intact eschar 1 cm x 1 cm x 0.1 cm left gluteal Wound bed:see above Drainage (amount, consistency, odor) minimal serosanguinous Periwound:thin skin Dressing procedure/placement/frequency:Cleanse wounds to left elbow and left foot with NS and pat dry.  Apply santyl to wound bed.  Cover with NS moist gauze.  Secure with dry dressing.  Change daily.  Barrier cream to buttocks twice daily an PRN soilage Will not follow at this time.  Please re-consult if needed.  Domenic Moras MSN, RN, FNP-BC CWON Wound, Ostomy, Continence Nurse Pager [phone]

## 2018-03-23 NOTE — Progress Notes (Signed)
Patient complaining of pain all over; NPO for EGD this am; Hospitalist paged for IV pain medication. Awaiting callback. Barbaraann Faster, RN 7:02 AM 03/23/2018

## 2018-03-23 NOTE — Progress Notes (Signed)
Dr. Ara Kussmaul notified of MEWS changes; acknowledged; no new orders. Barbaraann Faster, RN 12:57 AM 03/23/2018

## 2018-03-23 NOTE — Care Management Note (Signed)
Case Management Note  Patient Details  Name: Sonya [name] MRN: [id number] Date of Birth: Mar 02, 1932  Subjective/Objective:                 presents from home with black colored emesis. Recent discharge 02/24/18 home with [facility] physical therapy. Hemoglobin 7.8 today. so far, she has not received a transfusion. For EGD today  Action/Plan: Notified [facility] of admission and informed patient was also receiving nursing along with physical therapy  Expected Discharge Date:                  Expected Discharge Plan:     In-House Referral:     Discharge planning Services     Post Acute Care Choice:    Choice offered to:     DME Arranged:    DME Agency:     HH Arranged:    HH Agency:     Status of Service:     If discussed at [employer] of [employer], dates discussed:    Additional Comments:  Katrina Stack, RN 03/23/2018, 3:16 PM

## 2018-03-23 NOTE — Anesthesia Post-op Follow-up Note (Signed)
Anesthesia QCDR form completed.        

## 2018-03-23 NOTE — Transfer of Care (Signed)
Immediate Anesthesia Transfer of Care Note  Patient: Sonya [name]  Procedure(s) Performed: ESOPHAGOGASTRODUODENOSCOPY (EGD) (N/A )  Patient Location: PACU  Anesthesia Type:General  Level of Consciousness: sedated  Airway & Oxygen Therapy: Patient Spontanous Breathing and Patient connected to face mask oxygen  Post-op Assessment: Report given to RN and Post -op Vital signs reviewed and stable  Post vital signs: Reviewed and stable  Last Vitals:  Vitals Value Taken Time  BP 132/54 03/23/2018  1:23 PM  Temp 36.1 C 03/23/2018  1:23 PM  Pulse 109 03/23/2018  1:23 PM  Resp 20 03/23/2018  1:23 PM  SpO2 99 % 03/23/2018  1:23 PM  Vitals shown include unvalidated device data.  Last Pain:  Vitals:   03/23/18 1323  TempSrc:   PainSc: 0-No pain      Patients Stated Pain Goal: 5 (29/04/75 3391)  Complications: No apparent anesthesia complications

## 2018-03-23 NOTE — Anesthesia Preprocedure Evaluation (Signed)
Anesthesia Evaluation  Patient identified by MRN, date of birth, ID band Patient awake    Reviewed: Allergy & Precautions, NPO status , Patient's Chart, lab work & pertinent test results, reviewed documented beta blocker date and time   Airway Mallampati: II  TM Distance: >3 FB     Dental  (+) Chipped   Pulmonary former smoker,           Cardiovascular hypertension, Pt. on medications + CAD and + Cardiac Stents       Neuro/Psych  Neuromuscular disease CVA, Residual Symptoms    GI/Hepatic GERD  ,  Endo/Other  Hypothyroidism   Renal/GU Renal disease     Musculoskeletal   Abdominal   Peds  Hematology   Anesthesia Other Findings EF 60-65 6 mo ago.  Reproductive/Obstetrics                             Anesthesia Physical Anesthesia Plan  ASA: III  Anesthesia Plan: General   Post-op Pain Management:    Induction: Intravenous  PONV Risk Score and Plan:   Airway Management Planned:   Additional Equipment:   Intra-op Plan:   Post-operative Plan:   Informed Consent: I have reviewed the patients History and Physical, chart, labs and discussed the procedure including the risks, benefits and alternatives for the proposed anesthesia with the patient or authorized representative who has indicated his/her understanding and acceptance.       Plan Discussed with: CRNA  Anesthesia Plan Comments:         Anesthesia Quick Evaluation

## 2018-03-23 NOTE — Op Note (Addendum)
[facility]enterology Patient Name: Sonya [name] Procedure Date: 03/23/2018 12:56 PM MRN: [id number] Account #: [id number] Date of Birth: 1932-08-21 Admit Type: Inpatient Age: 85 Room: [facility] ENDO ROOM 1 Gender: Female Note Status: Finalized Procedure:            Upper GI endoscopy Indications:          Coffee-ground emesis Providers:            Lin Landsman MD, MD Referring MD:         Baxter Hire, MD (Referring MD) Medicines:            Monitored Anesthesia Care Complications:        No immediate complications. Estimated blood loss: None. Procedure:            Pre-Anesthesia Assessment:                       - Prior to the procedure, a History and Physical was                        performed, and patient medications and allergies were                        reviewed. The patient is competent. The risks and                        benefits of the procedure and the sedation options and                        risks were discussed with the patient. All questions                        were answered and informed consent was obtained.                        Patient identification and proposed procedure were                        verified by the physician, the nurse, the                        anesthesiologist, the anesthetist and the technician in                        the pre-procedure area in the procedure room in the                        endoscopy suite. Mental Status Examination: alert and                        oriented. Airway Examination: normal oropharyngeal                        airway and neck mobility. Respiratory Examination:                        clear to auscultation. CV Examination: normal.                        Prophylactic Antibiotics: The patient does not require  prophylactic antibiotics. Prior Anticoagulants: The                        patient has taken Eliquis (apixaban), last dose was 2            days prior to procedure. ASA Grade Assessment: III - A                        patient with severe systemic disease. After reviewing                        the risks and benefits, the patient was deemed in                        satisfactory condition to undergo the procedure. The                        anesthesia plan was to use monitored anesthesia care                        (MAC). Immediately prior to administration of                        medications, the patient was re-assessed for adequacy                        to receive sedatives. The heart rate, respiratory rate,                        oxygen saturations, blood pressure, adequacy of                        pulmonary ventilation, and response to care were                        monitored throughout the procedure. The physical status                        of the patient was re-assessed after the procedure.                       After obtaining informed consent, the endoscope was                        passed under direct vision. Throughout the procedure,                        the patient's blood pressure, pulse, and oxygen                        saturations were monitored continuously. The Endoscope                        was introduced through the mouth, and advanced to the                        second part of duodenum. The upper GI endoscopy was                        accomplished without difficulty. The  patient tolerated                        the procedure well. Findings:      The duodenal bulb and second portion of the duodenum were normal.      Striped mildly erythematous mucosa without bleeding was found in the       entire examined stomach.      A medium-sized hiatal hernia was present.      Two diminutive bleeding lesions, either from angioectasias or friable       mucosa were found on the lesser curvature of the proximal gastric body.       Fulguration to stop the bleeding by bipolar probe was successful.        Estimated blood loss: none. Impression:           - Normal duodenal bulb and second portion of the                        duodenum.                       - Erythematous mucosa in the stomach.                       - Medium-sized hiatal hernia.                       - Two bleeding angioectasias or friable mucosa in the                        stomach. Treated with bipolar cautery.                       - No specimens collected. Recommendation:       - Return patient to hospital ward for ongoing care.                       - Cardiac diet today.                       - Continue present medications.                       - Use Prevacid (lansoprazole) 30 mg PO daily.                       - Return to my office in 4 weeks.                       - Resume Eliquis (apixaban) at prior dose today. Refer                        to primary physician for further adjustment of therapy. Procedure Code(s):    --- Professional ---                       [phone], Esophagogastroduodenoscopy, flexible, transoral;                        with control of bleeding, any method Diagnosis Code(s):    --- Professional ---  K31.89, Other diseases of stomach and duodenum                       K44.9, Diaphragmatic hernia without obstruction or                        gangrene                       K31.811, Angiodysplasia of stomach and duodenum with                        bleeding                       K92.0, Hematemesis CPT copyright 2018 American Medical Association. All rights reserved. The codes documented in this report are preliminary and upon coder review may  be revised to meet current compliance requirements. Dr. Ulyess Mort Lin Landsman MD, MD 03/23/2018 1:21:57 PM This report has been signed electronically. Number of Addenda: 0 Note Initiated On: 03/23/2018 12:56 PM      [facility]

## 2018-03-24 ENCOUNTER — Encounter: Payer: Self-pay | Admitting: Gastroenterology

## 2018-03-24 LAB — HEMOGLOBIN: HEMOGLOBIN: 7.5 g/dL — AB (ref 12.0–15.0)

## 2018-03-24 LAB — POTASSIUM: Potassium: 3.2 mmol/L — ABNORMAL LOW (ref 3.5–5.1)

## 2018-03-24 MED ORDER — LANSOPRAZOLE 30 MG PO CPDR: 30.0000 mg | DELAYED_RELEASE_CAPSULE | Freq: Every day | ORAL | 1 refills | Status: DC

## 2018-03-24 MED ORDER — POTASSIUM CHLORIDE CRYS ER 20 MEQ PO TBCR: 40.0000 meq | EXTENDED_RELEASE_TABLET | Freq: Once | ORAL | Status: DC

## 2018-03-24 MED ORDER — APIXABAN 2.5 MG PO TABS: 2.5000 mg | ORAL_TABLET | Freq: Two times a day (BID) | ORAL | 0 refills | Status: DC

## 2018-03-24 MED ORDER — COLLAGENASE 250 UNIT/GM EX OINT: TOPICAL_OINTMENT | Freq: Every day | CUTANEOUS | 0 refills | Status: DC

## 2018-03-24 MED ORDER — HYDROCODONE-ACETAMINOPHEN 5-325 MG PO TABS: 1.0000 | ORAL_TABLET | Freq: Four times a day (QID) | ORAL | 0 refills | Status: DC | PRN

## 2018-03-24 NOTE — Discharge Planning (Signed)
Patient IV x2 removed.  RN assessment and VS revealed stability for DC to home with resumption of HH RN and PT ([facility]).  Discharge papers given, explained and educated. Informed of suggested FU appt and appt made.  Scripts printed and escribed to pharm, per patient request.  Once ready, will be wheeled to front and family transporting home via car.

## 2018-03-24 NOTE — Progress Notes (Addendum)
Discharge the patient home, resume home health services, decrease Eliquis to 2.5 mg p.o. twice daily, recommended dose of Eliquis after age 85 is 2.5 mg p.o. twice daily.  Patient advised to stay away from NSAID use, use Vicodin for pain control for her arthritis pain.  Same explained to patient son.  Use Vicodin, Zofran as needed for nausea.  Patient says Tylenol does not help, does not went want to take Vicodin but she has GI bleed with NSAID's and Eliquis that is taken care of this admission so she should stay away from .  NSAID's.

## 2018-03-24 NOTE — Care Management Note (Signed)
Case Management Note  Patient Details  Name: Sonya [name] MRN: [id number] Date of Birth: 04/30/1932   Patient to discharge today.  Home health resumption orders are in.  Helene Kelp with [facility] notified of discharge.   Subjective/Objective:                    Action/Plan:   Expected Discharge Date:  03/24/18               Expected Discharge Plan:  [place]  In-House Referral:     Discharge planning Services     Post Acute Care Choice:  Resumption of Svcs/PTA Provider Choice offered to:     DME Arranged:    DME Agency:     HH Arranged:  RN, PT, Nurse's Aide [place] Agency:  [facility] (now [facility])  Status of Service:  Completed, signed off  If discussed at [employer] of Stay Meetings, dates discussed:    Additional Comments:  Beverly Sessions, RN 03/24/2018, 11:27 AM

## 2018-03-25 LAB — URINE CULTURE: Culture: 30000 — AB

## 2018-03-26 ENCOUNTER — Emergency Department: Payer: Medicare PPO

## 2018-03-26 ENCOUNTER — Inpatient Hospital Stay
Admission: EM | Admit: 2018-03-26 | Discharge: 2018-04-01 | DRG: 067 | Disposition: A | Discharge status: Home Health | Payer: Medicare PPO | Attending: Internal Medicine | Admitting: Internal Medicine

## 2018-03-26 ENCOUNTER — Other Ambulatory Visit: Payer: Self-pay

## 2018-03-26 DIAGNOSIS — I2510 Atherosclerotic heart disease of native coronary artery without angina pectoris: Secondary | ICD-10-CM | POA: Diagnosis present

## 2018-03-26 DIAGNOSIS — R531 Weakness: Secondary | ICD-10-CM

## 2018-03-26 DIAGNOSIS — Z8249 Family history of ischemic heart disease and other diseases of the circulatory system: Secondary | ICD-10-CM | POA: Diagnosis not present

## 2018-03-26 DIAGNOSIS — Z888 Allergy status to other drugs, medicaments and biological substances status: Secondary | ICD-10-CM

## 2018-03-26 DIAGNOSIS — Z7902 Long term (current) use of antithrombotics/antiplatelets: Secondary | ICD-10-CM

## 2018-03-26 DIAGNOSIS — Z90710 Acquired absence of both cervix and uterus: Secondary | ICD-10-CM

## 2018-03-26 DIAGNOSIS — I10 Essential (primary) hypertension: Secondary | ICD-10-CM | POA: Diagnosis present

## 2018-03-26 DIAGNOSIS — Z22322 Carrier or suspected carrier of Methicillin resistant Staphylococcus aureus: Secondary | ICD-10-CM | POA: Diagnosis not present

## 2018-03-26 DIAGNOSIS — Z853 Personal history of malignant neoplasm of breast: Secondary | ICD-10-CM | POA: Diagnosis not present

## 2018-03-26 DIAGNOSIS — Z79899 Other long term (current) drug therapy: Secondary | ICD-10-CM | POA: Diagnosis not present

## 2018-03-26 DIAGNOSIS — I672 Cerebral atherosclerosis: Secondary | ICD-10-CM | POA: Diagnosis not present

## 2018-03-26 DIAGNOSIS — R4182 Altered mental status, unspecified: Secondary | ICD-10-CM

## 2018-03-26 DIAGNOSIS — I6503 Occlusion and stenosis of bilateral vertebral arteries: Secondary | ICD-10-CM | POA: Diagnosis present

## 2018-03-26 DIAGNOSIS — R000 Tachycardia, unspecified: Secondary | ICD-10-CM | POA: Diagnosis present

## 2018-03-26 DIAGNOSIS — Z7982 Long term (current) use of aspirin: Secondary | ICD-10-CM

## 2018-03-26 DIAGNOSIS — I708 Atherosclerosis of other arteries: Secondary | ICD-10-CM | POA: Diagnosis present

## 2018-03-26 DIAGNOSIS — E876 Hypokalemia: Secondary | ICD-10-CM | POA: Diagnosis not present

## 2018-03-26 DIAGNOSIS — K219 Gastro-esophageal reflux disease without esophagitis: Secondary | ICD-10-CM | POA: Diagnosis present

## 2018-03-26 DIAGNOSIS — Z7401 Bed confinement status: Secondary | ICD-10-CM

## 2018-03-26 DIAGNOSIS — I69354 Hemiplegia and hemiparesis following cerebral infarction affecting left non-dominant side: Secondary | ICD-10-CM | POA: Diagnosis not present

## 2018-03-26 DIAGNOSIS — Z66 Do not resuscitate: Secondary | ICD-10-CM | POA: Diagnosis present

## 2018-03-26 DIAGNOSIS — D72829 Elevated white blood cell count, unspecified: Secondary | ICD-10-CM | POA: Diagnosis not present

## 2018-03-26 DIAGNOSIS — Z87891 Personal history of nicotine dependence: Secondary | ICD-10-CM

## 2018-03-26 DIAGNOSIS — Z79890 Hormone replacement therapy (postmenopausal): Secondary | ICD-10-CM | POA: Diagnosis not present

## 2018-03-26 DIAGNOSIS — R55 Syncope and collapse: Secondary | ICD-10-CM

## 2018-03-26 DIAGNOSIS — E43 Unspecified severe protein-calorie malnutrition: Secondary | ICD-10-CM

## 2018-03-26 DIAGNOSIS — E785 Hyperlipidemia, unspecified: Secondary | ICD-10-CM | POA: Diagnosis present

## 2018-03-26 DIAGNOSIS — I6523 Occlusion and stenosis of bilateral carotid arteries: Principal | ICD-10-CM | POA: Diagnosis present

## 2018-03-26 DIAGNOSIS — I70208 Unspecified atherosclerosis of native arteries of extremities, other extremity: Secondary | ICD-10-CM | POA: Diagnosis not present

## 2018-03-26 DIAGNOSIS — Z823 Family history of stroke: Secondary | ICD-10-CM

## 2018-03-26 DIAGNOSIS — Z6821 Body mass index (BMI) 21.0-21.9, adult: Secondary | ICD-10-CM

## 2018-03-26 DIAGNOSIS — E039 Hypothyroidism, unspecified: Secondary | ICD-10-CM | POA: Diagnosis present

## 2018-03-26 LAB — MAGNESIUM: Magnesium: 1.8 mg/dL (ref 1.7–2.4)

## 2018-03-26 LAB — URINALYSIS, COMPLETE (UACMP) WITH MICROSCOPIC
Bacteria, UA: NONE SEEN
Bilirubin Urine: NEGATIVE
Glucose, UA: NEGATIVE mg/dL
Hgb urine dipstick: NEGATIVE
Ketones, ur: NEGATIVE mg/dL
Leukocytes, UA: NEGATIVE
Nitrite: NEGATIVE
Protein, ur: NEGATIVE mg/dL
Specific Gravity, Urine: 1.008 (ref 1.005–1.030)
pH: 7 (ref 5.0–8.0)

## 2018-03-26 LAB — COMPREHENSIVE METABOLIC PANEL
ALT: 15 U/L (ref 0–44)
AST: 20 U/L (ref 15–41)
Albumin: 3.8 g/dL (ref 3.5–5.0)
Alkaline Phosphatase: 56 U/L (ref 38–126)
Anion gap: 10 (ref 5–15)
BUN: 15 mg/dL (ref 8–23)
CO2: 29 mmol/L (ref 22–32)
Calcium: 9.3 mg/dL (ref 8.9–10.3)
Chloride: 100 mmol/L (ref 98–111)
Creatinine, Ser: 1.08 mg/dL — ABNORMAL HIGH (ref 0.44–1.00)
GFR calc Af Amer: 55 mL/min — ABNORMAL LOW (ref >60)
GFR calc non Af Amer: 47 mL/min — ABNORMAL LOW (ref >60)
GLUCOSE: 129 mg/dL — AB (ref 70–99)
Potassium: 2.6 mmol/L — CL (ref 3.5–5.1)
Sodium: 139 mmol/L (ref 135–145)
TOTAL PROTEIN: 6.7 g/dL (ref 6.5–8.1)
Total Bilirubin: 0.8 mg/dL (ref 0.3–1.2)

## 2018-03-26 LAB — BPAM RBC
Blood Product Expiration Date: 202002222359
Blood Product Expiration Date: 202002232359
Unit Type and Rh: 6200
Unit Type and Rh: 6200

## 2018-03-26 LAB — CBC
HCT: 29.3 % — ABNORMAL LOW (ref 36.0–46.0)
Hemoglobin: 9.3 g/dL — ABNORMAL LOW (ref 12.0–15.0)
MCH: 26.8 pg (ref 26.0–34.0)
MCHC: 31.7 g/dL (ref 30.0–36.0)
MCV: 84.4 fL (ref 80.0–100.0)
Platelets: 469 10*3/uL — ABNORMAL HIGH (ref 150–400)
RBC: 3.47 MIL/uL — ABNORMAL LOW (ref 3.87–5.11)
RDW: 16 % — ABNORMAL HIGH (ref 11.5–15.5)
WBC: 13.2 10*3/uL — ABNORMAL HIGH (ref 4.0–10.5)
nRBC: 0 % (ref 0.0–0.2)

## 2018-03-26 LAB — TYPE AND SCREEN
ABO/RH(D): A POS
Antibody Screen: NEGATIVE
Unit division: 0
Unit division: 0

## 2018-03-26 LAB — INFLUENZA PANEL BY PCR (TYPE A & B)
Influenza A By PCR: NEGATIVE
Influenza B By PCR: NEGATIVE

## 2018-03-26 LAB — TSH: TSH: 4.487 u[IU]/mL (ref 0.350–4.500)

## 2018-03-26 LAB — TROPONIN I: Troponin I: <0.03 ng/mL (ref <0.03)

## 2018-03-26 MED ORDER — GABAPENTIN 100 MG PO CAPS
100.0000 mg | ORAL_CAPSULE | Freq: Once | ORAL | Status: AC
  Administered 2018-03-26: 100 mg via ORAL
  Filled 2018-03-26: qty 1

## 2018-03-26 MED ORDER — APIXABAN 2.5 MG PO TABS
2.5000 mg | ORAL_TABLET | Freq: Two times a day (BID) | ORAL | Status: DC
  Administered 2018-03-26 – 2018-03-29 (×6): 2.5 mg via ORAL
  Filled 2018-03-26 (×6): qty 1

## 2018-03-26 MED ORDER — SODIUM CHLORIDE 0.9 % IV SOLN
Freq: Once | INTRAVENOUS | Status: AC
  Administered 2018-03-26: 11:00:00 via INTRAVENOUS

## 2018-03-26 MED ORDER — PANTOPRAZOLE SODIUM 20 MG PO TBEC
20.0000 mg | DELAYED_RELEASE_TABLET | Freq: Every day | ORAL | Status: DC
  Administered 2018-03-27 – 2018-04-01 (×5): 20 mg via ORAL
  Filled 2018-03-26 (×7): qty 1

## 2018-03-26 MED ORDER — ASPIRIN EC 81 MG PO TBEC
81.0000 mg | DELAYED_RELEASE_TABLET | Freq: Every day | ORAL | Status: DC
  Administered 2018-03-27 – 2018-03-30 (×4): 81 mg via ORAL
  Filled 2018-03-26 (×4): qty 1

## 2018-03-26 MED ORDER — GABAPENTIN 100 MG PO CAPS
100.0000 mg | ORAL_CAPSULE | Freq: Three times a day (TID) | ORAL | Status: DC
  Administered 2018-03-26 – 2018-04-01 (×14): 100 mg via ORAL
  Filled 2018-03-26 (×15): qty 1

## 2018-03-26 MED ORDER — CLONIDINE HCL 0.1 MG PO TABS
0.1000 mg | ORAL_TABLET | Freq: Two times a day (BID) | ORAL | Status: DC
  Administered 2018-03-26 – 2018-04-01 (×10): 0.1 mg via ORAL
  Filled 2018-03-26 (×10): qty 1

## 2018-03-26 MED ORDER — SODIUM CHLORIDE 0.9 % IV BOLUS
500.0000 mL | Freq: Once | INTRAVENOUS | Status: AC
  Administered 2018-03-26: 500 mL via INTRAVENOUS

## 2018-03-26 MED ORDER — VITAMIN D3 25 MCG (1000 UNIT) PO TABS
3000.0000 [IU] | ORAL_TABLET | Freq: Every day | ORAL | Status: DC
  Administered 2018-03-26 – 2018-03-31 (×6): 3000 [IU] via ORAL
  Filled 2018-03-26 (×13): qty 3

## 2018-03-26 MED ORDER — METOPROLOL TARTRATE 25 MG PO TABS
25.0000 mg | ORAL_TABLET | Freq: Two times a day (BID) | ORAL | Status: DC
  Administered 2018-03-26 – 2018-04-01 (×11): 25 mg via ORAL
  Filled 2018-03-26 (×12): qty 1

## 2018-03-26 MED ORDER — AMLODIPINE BESYLATE 10 MG PO TABS
10.0000 mg | ORAL_TABLET | Freq: Every day | ORAL | Status: DC
  Administered 2018-03-27 – 2018-03-28 (×2): 10 mg via ORAL
  Filled 2018-03-26 (×2): qty 1

## 2018-03-26 MED ORDER — ONDANSETRON 4 MG PO TBDP
4.0000 mg | ORAL_TABLET | Freq: Three times a day (TID) | ORAL | Status: DC | PRN
  Administered 2018-03-27 – 2018-03-30 (×2): 4 mg via ORAL
  Filled 2018-03-26 (×4): qty 1

## 2018-03-26 MED ORDER — ATORVASTATIN CALCIUM 20 MG PO TABS
20.0000 mg | ORAL_TABLET | Freq: Every day | ORAL | Status: DC
  Administered 2018-03-27 – 2018-03-31 (×5): 20 mg via ORAL
  Filled 2018-03-26 (×6): qty 1

## 2018-03-26 MED ORDER — LEVOTHYROXINE SODIUM 25 MCG PO TABS
25.0000 ug | ORAL_TABLET | Freq: Every day | ORAL | Status: DC
  Administered 2018-03-27 – 2018-04-01 (×5): 25 ug via ORAL
  Filled 2018-03-26 (×5): qty 1

## 2018-03-26 MED ORDER — B COMPLEX PO TABS: 1.0000 | ORAL_TABLET | Freq: Every day | ORAL | Status: DC

## 2018-03-26 MED ORDER — DOCUSATE SODIUM 100 MG PO CAPS: 100.0000 mg | ORAL_CAPSULE | Freq: Two times a day (BID) | ORAL | Status: DC | PRN

## 2018-03-26 MED ORDER — B COMPLEX-C PO TABS
1.0000 | ORAL_TABLET | Freq: Every day | ORAL | Status: DC
  Administered 2018-03-26 – 2018-04-01 (×6): 1 via ORAL
  Filled 2018-03-26 (×7): qty 1

## 2018-03-26 MED ORDER — BISACODYL 5 MG PO TBEC
10.0000 mg | DELAYED_RELEASE_TABLET | Freq: Every day | ORAL | Status: DC
  Administered 2018-03-27 – 2018-03-29 (×3): 10 mg via ORAL
  Administered 2018-03-30: 11:00:00 5 mg via ORAL
  Administered 2018-04-01: 10 mg via ORAL
  Filled 2018-03-26 (×5): qty 2

## 2018-03-26 MED ORDER — SIMVASTATIN 20 MG PO TABS: 40.0000 mg | ORAL_TABLET | Freq: Every day | ORAL | Status: DC

## 2018-03-26 MED ORDER — HYDROCODONE-ACETAMINOPHEN 5-325 MG PO TABS
1.0000 | ORAL_TABLET | Freq: Once | ORAL | Status: AC
  Administered 2018-03-26: 1 via ORAL
  Filled 2018-03-26: qty 1

## 2018-03-26 MED ORDER — VENLAFAXINE HCL ER 75 MG PO CP24
150.0000 mg | ORAL_CAPSULE | Freq: Every day | ORAL | Status: DC
  Administered 2018-03-26 – 2018-03-31 (×6): 150 mg via ORAL
  Filled 2018-03-26 (×6): qty 2

## 2018-03-26 MED ORDER — MELATONIN 5 MG PO TABS
2.5000 mg | ORAL_TABLET | Freq: Every evening | ORAL | Status: DC | PRN
  Filled 2018-03-26: qty 0.5

## 2018-03-26 MED ORDER — POTASSIUM CHLORIDE CRYS ER 20 MEQ PO TBCR
40.0000 meq | EXTENDED_RELEASE_TABLET | Freq: Two times a day (BID) | ORAL | Status: DC
  Administered 2018-03-26 – 2018-03-28 (×6): 40 meq via ORAL
  Filled 2018-03-26: qty 4
  Filled 2018-03-26 (×5): qty 2

## 2018-03-26 MED ORDER — HYDROCODONE-ACETAMINOPHEN 5-325 MG PO TABS
1.0000 | ORAL_TABLET | Freq: Four times a day (QID) | ORAL | Status: DC | PRN
  Administered 2018-03-26 – 2018-03-30 (×4): 1 via ORAL
  Filled 2018-03-26 (×4): qty 1

## 2018-03-26 NOTE — ED Notes (Signed)
Floor aware ED is code [facility]-made aware in need of someone to come get pt

## 2018-03-26 NOTE — ED Notes (Signed)
Attempted IV without success, IV team consult put in

## 2018-03-26 NOTE — ED Provider Notes (Signed)
[facility] Emergency Department Provider Note   ____________________________________________    I have reviewed the triage vital signs and the nursing notes.   HISTORY  Chief Complaint Altered Mental Status     HPI Sonya [name] is a 85 y.o. female who presents with reported altered mental status.  Per son patient has a history of CVA which is left her weak on her left side.  Patient is essentially bedbound.  This morning son was preparing her breakfast put her on the bedside commode and when he came back found her slumped over not responding.  Patient is back to her baseline now per son.  Review of medical records observed the patient was recently here and admitted for age upper GI bleed.  Patient does complain of some abdominal discomfort but primarily complains of pain in her left arm and left leg for which she was started on gabapentin 1 week ago  Past Medical History:  Diagnosis Date  . CAD (coronary artery disease)   . Carpal tunnel syndrome   . GERD (gastroesophageal reflux disease)   . History of shingles   . Hormone receptor positive breast cancer ([place])   . Hyperlipemia   . Hypertension   . Stroke [facility])     Patient Active Problem List   Diagnosis Date Noted  . Coffee ground emesis   . AVM (arteriovenous malformation) of stomach, acquired with hemorrhage   . Upper GI bleed 03/22/2018  . Pressure injury of skin 02/22/2018  . Decubitus ulcer of left ankle, unstageable ([place]) 02/22/2018  . Sepsis ([place]) 02/21/2018  . Calculus of common duct without obstruction   . Abnormal findings on diagnostic imaging of liver   . Problems with swallowing and mastication   . Acute gastritis without hemorrhage   . Stricture and stenosis of esophagus   . AKI (acute kidney injury) ([place]) 11/05/2017  . Nausea & vomiting 11/05/2017  . Choledocholithiasis   . Dysphasia   . Abdominal pain 10/30/2017  . Cerebral embolism with cerebral infarction 10/13/2017   . CVA (cerebral vascular accident) ([place]) 10/13/2017  . Hypertension 10/12/2017  . Hypertensive urgency 10/12/2017  . Hypothyroidism 10/12/2017  . Left-sided weakness 10/12/2017  . Elevated troponin 10/12/2017  . Unknown when suspected stroke patient was last well 10/12/2017  . History of depression 12/05/2016  . Hypothyroidism due to acquired atrophy of thyroid 06/05/2014  . Chronic renal insufficiency 11/28/2013  . GERD (gastroesophageal reflux disease) 11/28/2013  . H/O vitamin D deficiency 11/28/2013  . History of ischemic heart disease 11/28/2013  . Hyperlipidemia 11/28/2013    Past Surgical History:  Procedure Laterality Date  . ABDOMINAL HYSTERECTOMY    . APPENDECTOMY    . cornoary angioplasty    . ENDOSCOPIC RETROGRADE CHOLANGIOPANCREATOGRAPHY (ERCP) WITH PROPOFOL N/A 11/10/2017   Procedure: ENDOSCOPIC RETROGRADE CHOLANGIOPANCREATOGRAPHY (ERCP) WITH PROPOFOL;  Surgeon: Lucilla Lame, MD;  Location: [facility] ENDOSCOPY;  Service: Endoscopy;  Laterality: N/A;  . ESOPHAGOGASTRODUODENOSCOPY N/A 03/23/2018   Procedure: ESOPHAGOGASTRODUODENOSCOPY (EGD);  Surgeon: Lin Landsman, MD;  Location: [facility] ENDOSCOPY;  Service: Gastroenterology;  Laterality: N/A;  . ESOPHAGOGASTRODUODENOSCOPY (EGD) WITH PROPOFOL N/A 11/06/2017   Procedure: ESOPHAGOGASTRODUODENOSCOPY (EGD) WITH PROPOFOL;  Surgeon: Lucilla Lame, MD;  Location: [facility] ENDOSCOPY;  Service: Endoscopy;  Laterality: N/A;    Prior to Admission medications   Medication Sig Start Date End Date Taking? Authorizing Provider  amLODipine (NORVASC) 10 MG tablet Take 1 tablet (10 mg total) by mouth daily. 02/24/18  Yes Otila Back, MD  apixaban Arne [place])  2.5 MG TABS tablet Take 1 tablet (2.5 mg total) by mouth 2 (two) times daily. 03/24/18  Yes Epifanio Lesches, MD  aspirin EC 81 MG EC tablet Take 1 tablet (81 mg total) by mouth daily. 11/04/17  Yes Salary, Montell D, MD  b complex vitamins tablet Take 1 tablet by mouth daily.   Yes [provider]  bisacodyl (DUCODYL) 5 MG EC tablet Take 2 tablets (10 mg total) by mouth daily. 02/24/18  Yes Ojie, Jude, MD  Cholecalciferol (VITAMIN D3) 1000 units CAPS Take 3,000 Units by mouth at bedtime.  09/07/17  Yes [provider]  cloNIDine (CATAPRES) 0.2 MG tablet Take 1 tablet (0.2 mg total) by mouth 2 (two) times daily. Patient taking differently: Take 0.1 mg by mouth 2 (two) times daily.  10/15/17  Yes Roxan Hockey, MD  collagenase (SANTYL) ointment Apply topically daily. 03/24/18  Yes Epifanio Lesches, MD  gabapentin (NEURONTIN) 100 MG capsule Take 100 mg by mouth 3 (three) times daily. 03/15/18 03/15/19 Yes [provider]  HYDROcodone-acetaminophen (NORCO/VICODIN) 5-325 MG tablet Take 1 tablet by mouth every 6 (six) hours as needed for moderate pain or severe pain. 03/24/18  Yes Epifanio Lesches, MD  lansoprazole (PREVACID) 30 MG capsule Take 1 capsule (30 mg total) by mouth daily. 03/24/18 03/24/19 Yes Epifanio Lesches, MD  levothyroxine (SYNTHROID, LEVOTHROID) 25 MCG tablet Take 25 mcg by mouth daily before breakfast.  09/10/17  Yes [provider]  Melatonin 3 MG TABS Take 3 mg by mouth at bedtime as needed (sleep).    Yes [provider]  ondansetron (ZOFRAN) 4 MG tablet Take 4 mg by mouth every 8 (eight) hours as needed for nausea. 03/19/18  Yes [provider]  polyethylene glycol (MIRALAX / GLYCOLAX) packet Take 17 g by mouth daily as needed for moderate constipation. 11/16/17  Yes Wieting, Richard, MD  potassium chloride SA (K-DUR,KLOR-CON) 20 MEQ tablet Take 20 mEq by mouth daily. 03/15/18 03/15/19 Yes [provider]  simvastatin (ZOCOR) 40 MG tablet Take 1 tablet (40 mg total) by mouth daily at 6 PM. 02/24/18  Yes Ojie, Jude, MD  venlafaxine XR (EFFEXOR-XR) 150 MG 24 hr capsule Take 150 mg by mouth at bedtime. 09/10/17  Yes [provider]  ondansetron (ZOFRAN-ODT) 4 MG disintegrating tablet Take 1 tablet (4 mg  total) by mouth every 8 (eight) hours as needed for nausea or vomiting. 02/24/18   Stark Jock Jude, MD     Allergies Clopidogrel and Omeprazole  Family History  Problem Relation Age of Onset  . Hypertension Son   . Ovarian cancer Mother   . Stroke Father   . Stroke Son     Social History Social History   Tobacco Use  . Smoking status: Former Smoker    Packs/day: 0.50    Years: 20.00    Pack years: 10.00    Types: Cigarettes    Last attempt to quit: 02/17/1998    Years since quitting: 20.1  . Smokeless tobacco: Never Used  Substance Use Topics  . Alcohol use: Not Currently  . Drug use: Never    Review of Systems  Constitutional: No fever/chills Eyes: No visual changes.  ENT: No sore throat. Cardiovascular: Denies chest pain. Respiratory: Denies shortness of breath. Gastrointestinal: As above Genitourinary: Negative for dysuria. Musculoskeletal: Negative for back pain. Skin: Negative for rash. Neurological: Weakness left side chronic   ____________________________________________   PHYSICAL EXAM:  VITAL SIGNS: ED Triage Vitals [03/26/18 0944]  Enc Vitals Group  BP (!) 145/50     Pulse Rate 100     Resp 18     Temp (!) 97.5 F (36.4 C)     Temp Source Axillary     SpO2 100 %     Weight 50 kg (110 lb 3.7 oz)     Height 1.524 m (5')     Head Circumference      Peak Flow      Pain Score      Pain Loc      Pain Edu?      Excl. in [place]?     Constitutional: Alert and oriented. Eyes: Conjunctivae are normal.   Nose: No congestion/rhinnorhea. Mouth/Throat: Mucous membranes are moist.    Cardiovascular: Tachycardia, regular rhythm. Grossly normal heart sounds.  Good peripheral circulation. Respiratory: Normal respiratory effort.  No retractions. Lungs CTAB. Gastrointestinal: Soft and nontender. No distention.    Musculoskeletal:   Warm and well perfused Neurologic:   No gross focal neurologic deficits are appreciated.  Skin:  Skin is warm, dry and intact.  No rash noted. Psychiatric: Mood and affect are normal. Speech and behavior are normal.  ____________________________________________   LABS (all labs ordered are listed, but only abnormal results are displayed)  Labs Reviewed  CBC - Abnormal; Notable for the following components:      Result Value   WBC 13.2 (*)    RBC 3.47 (*)    Hemoglobin 9.3 (*)    HCT 29.3 (*)    RDW 16.0 (*)    Platelets 469 (*)    All other components within normal limits  COMPREHENSIVE METABOLIC PANEL - Abnormal; Notable for the following components:   Potassium 2.6 (*)    Glucose, Bld 129 (*)    Creatinine, Ser 1.08 (*)    GFR calc non Af Amer 47 (*)    GFR calc Af Amer 55 (*)    All other components within normal limits  URINALYSIS, COMPLETE (UACMP) WITH MICROSCOPIC - Abnormal; Notable for the following components:   Color, Urine YELLOW (*)    APPearance CLEAR (*)    All other components within normal limits  TROPONIN I  INFLUENZA PANEL BY PCR (TYPE A & B)   ____________________________________________  EKG  None ____________________________________________  RADIOLOGY  Chest x-ray unremarkable ____________________________________________   PROCEDURES  Procedure(s) performed: No  Procedures   Critical Care performed: No ____________________________________________   INITIAL IMPRESSION / ASSESSMENT AND PLAN / ED COURSE  Pertinent labs & imaging results that were available during my care of the patient were reviewed by me and considered in my medical decision making (see chart for details).  Patient presents with episode of weakness as described above.  Possible near syncopal episode but given extensive past medical history certainly concern for infection versus GI bleeding as well.  Pending labs, chest x-ray, urinalysis  Lab work overall reassuring except for mild hypokalemia and mild elevation white blood cell count.  No obvious infectious source however patient continually  tachycardic, have discussed with the hospitalist for admission for further evaluation    ____________________________________________   FINAL CLINICAL IMPRESSION(S) / ED DIAGNOSES  Final diagnoses:  Weakness  Altered mental status, unspecified altered mental status type        Note:  This document was prepared using Dragon voice recognition software and may include unintentional dictation errors.   Lavonia Drafts, MD 03/26/18 [phone]

## 2018-03-26 NOTE — Progress Notes (Signed)
PHARMACIST - PHYSICIAN ORDER COMMUNICATION  CONCERNING:  Simvastatin 40 mg daily and amlodipine - rhabdomyolysis risk   RECOMMENDATION(s): Patients on amlodipine and simvastatin >20mg /day have reported cases of rhabdomyolysis. Pharmacist is to assess simvastatin dose. If > 20 mg, substitute atorvastatin (Lipitor) 1mg  for each 2mg  simvastatin.  Simvastatin 40mg  has been changed to Atorvastatin 20mg  per protocol.   Pernell Dupre, PharmD, BCPS Clinical Pharmacist 03/26/2018 8:17 PM

## 2018-03-26 NOTE — Progress Notes (Signed)
Family Meeting Note  Advance Directive:yes  Today a meeting took place with the Patient and son.   The following clinical team members were present during this meeting:MD  The following were discussed:Patient's diagnosis: Hypokalemia, altered mental status, GI bleed, stroke, Patient's progosis: Unable to determine and Goals for treatment: DNR  Additional follow-up to be provided: PMD  Time spent during discussion:20 minutes  Vaughan Basta, MD

## 2018-03-26 NOTE — H&P (Signed)
[facility] [place] NAME: Sonya [name]    MR#:  [id number]  DATE OF BIRTH:  1933-02-02  DATE OF ADMISSION:  03/26/2018  PRIMARY CARE PHYSICIAN: Baxter Hire, MD   REQUESTING/REFERRING PHYSICIAN: Corky Downs  CHIEF COMPLAINT:   Chief Complaint  Patient presents with  . Altered Mental Status    HISTORY OF PRESENT ILLNESS: Sonya [name]  is a 85 y.o. female with a known history of coronary artery disease, gastroesophageal reflux disease, hyperlipidemia, hypertension, stroke in August 2019 and since then bedbound with left-sided weakness-has physical therapist coming at home and son takes care of. She was admitted to hospital for GI bleed and after EGD and found some angiectatic mucosa and lesions in the stomach which was treated with cautery-hemoglobin was stable and discharged home after advised by GI to resume her Eliquis dose. Son was with her in the morning put her on toilet, while preparing breakfast for her.  When he went back and check on her she was slumped over and not very well responsive-he tried to wake her up but she was not clearly awake so he brought to the emergency room. Patient had some complaint of nausea but denies any other complaints.  Later on patient becomes fully alert and oriented.  Noted to have hypokalemia and elevated blood pressure and heart rate in ER so given to hospitalist team for management of these issues.  PAST MEDICAL HISTORY:   Past Medical History:  Diagnosis Date  . CAD (coronary artery disease)   . Carpal tunnel syndrome   . GERD (gastroesophageal reflux disease)   . History of shingles   . Hormone receptor positive breast cancer ([place])   . Hyperlipemia   . Hypertension   . Stroke [facility])     PAST SURGICAL HISTORY:  Past Surgical History:  Procedure Laterality Date  . ABDOMINAL HYSTERECTOMY    . APPENDECTOMY    . cornoary angioplasty    . ENDOSCOPIC RETROGRADE CHOLANGIOPANCREATOGRAPHY (ERCP)  WITH PROPOFOL N/A 11/10/2017   Procedure: ENDOSCOPIC RETROGRADE CHOLANGIOPANCREATOGRAPHY (ERCP) WITH PROPOFOL;  Surgeon: Lucilla Lame, MD;  Location: [facility] ENDOSCOPY;  Service: Endoscopy;  Laterality: N/A;  . ESOPHAGOGASTRODUODENOSCOPY N/A 03/23/2018   Procedure: ESOPHAGOGASTRODUODENOSCOPY (EGD);  Surgeon: Lin Landsman, MD;  Location: [facility] ENDOSCOPY;  Service: Gastroenterology;  Laterality: N/A;  . ESOPHAGOGASTRODUODENOSCOPY (EGD) WITH PROPOFOL N/A 11/06/2017   Procedure: ESOPHAGOGASTRODUODENOSCOPY (EGD) WITH PROPOFOL;  Surgeon: Lucilla Lame, MD;  Location: [facility] ENDOSCOPY;  Service: Endoscopy;  Laterality: N/A;    SOCIAL HISTORY:  Social History   Tobacco Use  . Smoking status: Former Smoker    Packs/day: 0.50    Years: 20.00    Pack years: 10.00    Types: Cigarettes    Last attempt to quit: 02/17/1998    Years since quitting: 20.1  . Smokeless tobacco: Never Used  Substance Use Topics  . Alcohol use: Not Currently    FAMILY HISTORY:  Family History  Problem Relation Age of Onset  . Hypertension Son   . Ovarian cancer Mother   . Stroke Father   . Stroke Son     DRUG ALLERGIES:  Allergies  Allergen Reactions  . Clopidogrel Nausea And Vomiting     reported by [place] 11/24/13  . Omeprazole Other (See Comments)    Unknown reaction - reported by [place] 11/24/13    REVIEW OF SYSTEMS:   CONSTITUTIONAL: No fever, have fatigue or weakness.  EYES: No blurred or double  vision.  EARS, NOSE, AND THROAT: No tinnitus or ear pain.  RESPIRATORY: No cough, shortness of breath, wheezing or hemoptysis.  CARDIOVASCULAR: No chest pain, orthopnea, edema.  GASTROINTESTINAL: Some nausea, no vomiting, diarrhea or abdominal pain.  GENITOURINARY: No dysuria, hematuria.  ENDOCRINE: No polyuria, nocturia,  HEMATOLOGY: No anemia, easy bruising or bleeding SKIN: No rash or lesion. MUSCULOSKELETAL: No joint pain or arthritis.   NEUROLOGIC: No  tingling, numbness, weakness.  PSYCHIATRY: No anxiety or depression.   MEDICATIONS AT HOME:  Prior to Admission medications   Medication Sig Start Date End Date Taking? Authorizing Provider  amLODipine (NORVASC) 10 MG tablet Take 1 tablet (10 mg total) by mouth daily. 02/24/18  Yes Ojie, Jude, MD  apixaban (ELIQUIS) 2.5 MG TABS tablet Take 1 tablet (2.5 mg total) by mouth 2 (two) times daily. 03/24/18  Yes Epifanio Lesches, MD  aspirin EC 81 MG EC tablet Take 1 tablet (81 mg total) by mouth daily. 11/04/17  Yes Salary, Montell D, MD  b complex vitamins tablet Take 1 tablet by mouth daily.   Yes [provider]  bisacodyl (DUCODYL) 5 MG EC tablet Take 2 tablets (10 mg total) by mouth daily. 02/24/18  Yes Ojie, Jude, MD  Cholecalciferol (VITAMIN D3) 1000 units CAPS Take 3,000 Units by mouth at bedtime.  09/07/17  Yes [provider]  cloNIDine (CATAPRES) 0.2 MG tablet Take 1 tablet (0.2 mg total) by mouth 2 (two) times daily. Patient taking differently: Take 0.1 mg by mouth 2 (two) times daily.  10/15/17  Yes Roxan Hockey, MD  collagenase (SANTYL) ointment Apply topically daily. 03/24/18  Yes Epifanio Lesches, MD  gabapentin (NEURONTIN) 100 MG capsule Take 100 mg by mouth 3 (three) times daily. 03/15/18 03/15/19 Yes [provider]  HYDROcodone-acetaminophen (NORCO/VICODIN) 5-325 MG tablet Take 1 tablet by mouth every 6 (six) hours as needed for moderate pain or severe pain. 03/24/18  Yes Epifanio Lesches, MD  lansoprazole (PREVACID) 30 MG capsule Take 1 capsule (30 mg total) by mouth daily. 03/24/18 03/24/19 Yes Epifanio Lesches, MD  levothyroxine (SYNTHROID, LEVOTHROID) 25 MCG tablet Take 25 mcg by mouth daily before breakfast.  09/10/17  Yes [provider]  Melatonin 3 MG TABS Take 3 mg by mouth at bedtime as needed (sleep).    Yes [provider]  ondansetron (ZOFRAN) 4 MG tablet Take 4 mg by mouth every 8 (eight) hours as needed for nausea. 03/19/18   Yes [provider]  polyethylene glycol (MIRALAX / GLYCOLAX) packet Take 17 g by mouth daily as needed for moderate constipation. 11/16/17  Yes Wieting, Richard, MD  potassium chloride SA (K-DUR,KLOR-CON) 20 MEQ tablet Take 20 mEq by mouth daily. 03/15/18 03/15/19 Yes [provider]  simvastatin (ZOCOR) 40 MG tablet Take 1 tablet (40 mg total) by mouth daily at 6 PM. 02/24/18  Yes Ojie, Jude, MD  venlafaxine XR (EFFEXOR-XR) 150 MG 24 hr capsule Take 150 mg by mouth at bedtime. 09/10/17  Yes [provider]  ondansetron (ZOFRAN-ODT) 4 MG disintegrating tablet Take 1 tablet (4 mg total) by mouth every 8 (eight) hours as needed for nausea or vomiting. 02/24/18   Stark Jock Jude, MD      PHYSICAL EXAMINATION:   VITAL SIGNS: Blood pressure (!) 186/76, pulse (!) 107, temperature 98.1 F (36.7 C), temperature source Oral, resp. rate 15, height 5' (1.524 m), weight 50 kg, SpO2 100 %.  GENERAL:  85 y.o.-year-old patient lying in the bed with no acute distress.  EYES: Pupils equal,  round, reactive to light and accommodation. No scleral icterus. Extraocular muscles intact.  HEENT: Head atraumatic, normocephalic. Oropharynx and nasopharynx clear.  NECK:  Supple, no jugular venous distention. No thyroid enlargement, no tenderness.  LUNGS: Normal breath sounds bilaterally, no wheezing, rales,rhonchi or crepitation. No use of accessory muscles of respiration.  CARDIOVASCULAR: S1, S2 normal. No murmurs, rubs, or gallops.  ABDOMEN: Soft, nontender, nondistended. Bowel sounds present. No organomegaly or mass.  EXTREMITIES: No pedal edema, cyanosis, or clubbing.  NEUROLOGIC: Cranial nerves II through XII are intact. Muscle strength 4/5 in right-sided extremities 1/5 in left lower limb and 0 /5 in left upper. Sensation intact. Gait not checked.  PSYCHIATRIC: The patient is alert and oriented x 3.  SKIN: No obvious rash, lesion, or ulcer.   LABORATORY PANEL:   CBC Recent Labs  Lab  03/22/18 0930 03/22/18 1530  03/23/18 0739 03/23/18 1547 03/23/18 2310 03/24/18 0737 03/26/18 0950  WBC 19.3*  --   --   --   --   --   --  13.2*  HGB 10.0* 8.7*   < > 7.8* 7.6* 8.3* 7.5* 9.3*  HCT 31.7* 27.3*  --   --   --   --   --  29.3*  PLT 430*  --   --   --   --   --   --  469*  MCV 83.9  --   --   --   --   --   --  84.4  MCH 26.5  --   --   --   --   --   --  26.8  MCHC 31.5  --   --   --   --   --   --  31.7  RDW 15.1  --   --   --   --   --   --  16.0*  LYMPHSABS 1.9  --   --   --   --   --   --   --   MONOABS 0.9  --   --   --   --   --   --   --   EOSABS 0.0  --   --   --   --   --   --   --   BASOSABS 0.0  --   --   --   --   --   --   --    < > = values in this interval not displayed.   ------------------------------------------------------------------------------------------------------------------  Chemistries  Recent Labs  Lab 03/22/18 0930 03/23/18 0739 03/23/18 1226 03/24/18 0502 03/26/18 0950  NA 140 144  --   --  139  K 4.1 2.9* 3.0* 3.2* 2.6*  CL 103 110  --   --  100  CO2 28 26  --   --  29  GLUCOSE 151* 96  --   --  129*  BUN 36* 26*  --   --  15  CREATININE 1.72* 0.91  --   --  1.08*  CALCIUM 9.4 8.3*  --   --  9.3  MG  --  1.8  --   --   --   AST 25  --   --   --  20  ALT 15  --   --   --  15  ALKPHOS 55  --   --   --  56  BILITOT 0.6  --   --   --  0.8   ------------------------------------------------------------------------------------------------------------------ estimated creatinine clearance is 28.3 mL/min (A) (by C-G formula based on SCr of 1.08 mg/dL (H)). ------------------------------------------------------------------------------------------------------------------ No results for input(s): TSH, T4TOTAL, T3FREE, THYROIDAB in the last 72 hours.  Invalid input(s): FREET3   Coagulation profile Recent Labs  Lab 03/22/18 0930  INR 1.57    ------------------------------------------------------------------------------------------------------------------- No results for input(s): DDIMER in the last 72 hours. -------------------------------------------------------------------------------------------------------------------  Cardiac Enzymes Recent Labs  Lab 03/26/18 0950  TROPONINI <0.03   ------------------------------------------------------------------------------------------------------------------ Invalid input(s): POCBNP  ---------------------------------------------------------------------------------------------------------------  Urinalysis    Component Value Date/Time   COLORURINE YELLOW (A) 03/26/2018 0950   APPEARANCEUR CLEAR (A) 03/26/2018 0950   LABSPEC 1.008 03/26/2018 0950   PHURINE 7.0 03/26/2018 0950   GLUCOSEU NEGATIVE 03/26/2018 0950   HGBUR NEGATIVE 03/26/2018 0950   BILIRUBINUR NEGATIVE 03/26/2018 0950   KETONESUR NEGATIVE 03/26/2018 0950   PROTEINUR NEGATIVE 03/26/2018 0950   NITRITE NEGATIVE 03/26/2018 0950   LEUKOCYTESUR NEGATIVE 03/26/2018 0950     RADIOLOGY: Dg Chest Portable 1 View  Result Date: 03/26/2018 CLINICAL DATA:  Altered mental status. Nausea and left arm pain. History of stroke. EXAM: PORTABLE CHEST 1 VIEW COMPARISON:  03/22/2018 and 02/21/2018. FINDINGS: 0948 hours. The heart size and mediastinal contours are stable. There is extensive aortic atherosclerosis and mild tortuosity. The lungs are clear. There is no pleural effusion or pneumothorax. Old fracture of the distal right clavicle noted. No acute osseous findings are seen. IMPRESSION: Stable chest.  No active cardiopulmonary process. Electronically Signed   By: Richardean Sale M.D.   On: 03/26/2018 10:05    EKG: Orders placed or performed during the hospital encounter of 03/22/18  . ED EKG  . ED EKG    IMPRESSION AND PLAN:  *Hypokalemia and generalized weakness Likely malnutrition Check magnesium. Patient had  significant and recurrent hypokalemia during last admission also. We will replace oral currently and recheck tomorrow.  *Altered mental status This happened at home, patient is currently alert and oriented. UA is negative, chest x-ray is clear, influenza test is still pending.  Son denies any fever.  *Recent GI bleed Continue PPI twice daily and continue anticoagulation as permitted by GI.  *Uncontrolled hypertension Continue amlodipine and clonidine, add metoprolol.  *Tachycardia Add metoprolol, waiting for flu result, other infection reports are negative.  * hypothyroidism Continue levothyroxine.  Check TSH.  *History of stroke Patient is on Eliquis, continue the same.  All the records are reviewed and case discussed with ED provider. Management plans discussed with the patient, family and they are in agreement.  CODE STATUS: DNR Code Status History    Date Active Date Inactive Code Status Order ID Comments User Context   03/22/2018 1536 03/24/2018 1438 Full Code [id number]  Fritzi Mandes, MD Inpatient   02/21/2018 1200 02/24/2018 1921 Full Code [id number]  Hillary Bow, MD ED   11/06/2017 0040 11/16/2017 2146 DNR [id number]  Lance Coon, MD Inpatient   10/30/2017 1648 11/03/2017 1703 DNR [id number]  Dustin Flock, MD Inpatient   10/12/2017 2234 10/15/2017 2130 Full Code [id number]  Opyd, Ilene Qua, MD ED     Discussed with patient's son in the room.  TOTAL TIME TAKING CARE OF THIS PATIENT: 50 minutes.    Vaughan Basta M.D on 03/26/2018   Between 7am to 6pm - Pager - [phone]  After 6pm go to [email or web address] - password [email or web address] [facility]  Office  [phone]  CC: Primary care physician; Baxter Hire, MD   Note: This dictation was prepared with Dragon dictation  along with smaller phrase technology. Any transcriptional errors that result from this process are unintentional.

## 2018-03-26 NOTE — ED Triage Notes (Addendum)
PT came to ED via EMS from home. Per son, found pt acting unusual, slumped over and drooling. Upon EMS arrival pt was alert and oriented. PT now c/o nausea and left arm pain. History of stroke in August with left sided deficits.

## 2018-03-26 NOTE — ED Notes (Signed)
IV team at bedside 

## 2018-03-26 NOTE — ED Notes (Signed)
Hospitalist to bedside at this time 

## 2018-03-27 ENCOUNTER — Inpatient Hospital Stay: Payer: Medicare PPO

## 2018-03-27 LAB — CBC
HCT: 26.8 % — ABNORMAL LOW (ref 36.0–46.0)
Hemoglobin: 8.4 g/dL — ABNORMAL LOW (ref 12.0–15.0)
MCH: 26.3 pg (ref 26.0–34.0)
MCHC: 31.3 g/dL (ref 30.0–36.0)
MCV: 84 fL (ref 80.0–100.0)
Platelets: 375 10*3/uL (ref 150–400)
RBC: 3.19 MIL/uL — ABNORMAL LOW (ref 3.87–5.11)
RDW: 16.5 % — ABNORMAL HIGH (ref 11.5–15.5)
WBC: 11 10*3/uL — ABNORMAL HIGH (ref 4.0–10.5)
nRBC: 0 % (ref 0.0–0.2)

## 2018-03-27 LAB — BASIC METABOLIC PANEL
Anion gap: 8 (ref 5–15)
BUN: 13 mg/dL (ref 8–23)
CO2: 27 mmol/L (ref 22–32)
Calcium: 8.4 mg/dL — ABNORMAL LOW (ref 8.9–10.3)
Chloride: 104 mmol/L (ref 98–111)
Creatinine, Ser: 0.82 mg/dL (ref 0.44–1.00)
GFR calc Af Amer: >60 mL/min (ref >60)
GFR calc non Af Amer: >60 mL/min (ref >60)
Glucose, Bld: 76 mg/dL (ref 70–99)
POTASSIUM: 3.2 mmol/L — AB (ref 3.5–5.1)
SODIUM: 139 mmol/L (ref 135–145)

## 2018-03-27 LAB — HEMOGLOBIN AND HEMATOCRIT, BLOOD
HCT: 28.9 % — ABNORMAL LOW (ref 36.0–46.0)
Hemoglobin: 8.9 g/dL — ABNORMAL LOW (ref 12.0–15.0)

## 2018-03-27 LAB — MRSA PCR SCREENING: MRSA by PCR: POSITIVE — AB

## 2018-03-27 LAB — POTASSIUM: POTASSIUM: 3.3 mmol/L — AB (ref 3.5–5.1)

## 2018-03-27 LAB — GLUCOSE, CAPILLARY: Glucose-Capillary: 114 mg/dL — ABNORMAL HIGH (ref 70–99)

## 2018-03-27 MED ORDER — POTASSIUM CHLORIDE CRYS ER 20 MEQ PO TBCR: 40.0000 meq | EXTENDED_RELEASE_TABLET | Freq: Once | ORAL | Status: DC

## 2018-03-27 MED ORDER — METOPROLOL TARTRATE 25 MG PO TABS: 25.0000 mg | ORAL_TABLET | Freq: Two times a day (BID) | ORAL | 0 refills | Status: DC

## 2018-03-27 MED ORDER — B COMPLEX-C PO TABS: 1.0000 | ORAL_TABLET | Freq: Every day | ORAL | Status: DC

## 2018-03-27 MED ORDER — POTASSIUM CHLORIDE CRYS ER 20 MEQ PO TBCR
20.0000 meq | EXTENDED_RELEASE_TABLET | Freq: Once | ORAL | Status: AC
  Administered 2018-03-27: 10:00:00 20 meq via ORAL
  Filled 2018-03-27: qty 1

## 2018-03-27 NOTE — Progress Notes (Signed)
This RT responded to Rapid Response to this patients room. Approximate time spent- 10 minutes.

## 2018-03-27 NOTE — Discharge Summary (Signed)
[facility] [place] NAME: Sonya [name]    MR#:  [id number]  DATE OF BIRTH:  [birth year]  DATE OF ADMISSION:  03/26/2018 ADMITTING PHYSICIAN: Vaughan Basta, MD  DATE OF DISCHARGE:  03/27/18 PRIMARY CARE PHYSICIAN: Baxter Hire, MD    ADMISSION DIAGNOSIS:  Weakness [R53.1] Altered mental status, unspecified altered mental status type [R41.82]  DISCHARGE DIAGNOSIS:  Principal Problem:   Hypokalemia   SECONDARY DIAGNOSIS:   Past Medical History:  Diagnosis Date  . CAD (coronary artery disease)   . Carpal tunnel syndrome   . GERD (gastroesophageal reflux disease)   . History of shingles   . Hormone receptor positive breast cancer ([place])   . Hyperlipemia   . Hypertension   . Stroke [facility])     HOSPITAL COURSE:   HISTORY OF PRESENT ILLNESS: Sonya [name]  is a 85 y.o. female with a known history of coronary artery disease, gastroesophageal reflux disease, hyperlipidemia, hypertension, stroke in August 2019 and since then bedbound with left-sided weakness-has physical therapist coming at home and son takes care of. She was admitted to hospital for GI bleed and after EGD and found some angiectatic mucosa and lesions in the stomach which was treated with cautery-hemoglobin was stable and discharged home after advised by GI to resume her Eliquis dose. Son was with her in the morning put her on toilet, while preparing breakfast for her.  When he went back and check on her she was slumped over and not very well responsive-he tried to wake her up but she was not clearly awake so he brought to the emergency room. Patient had some complaint of nausea but denies any other complaints.  Later on patient becomes fully alert and oriented.  Noted to have hypokalemia and elevated blood pressure and heart rate in ER so given to hospitalist team for management of these issues.  Hypokalemia and generalized weakness Likely  malnutrition-dietitian consult Normal magnesium Patient had significant and recurrent hypokalemia during last admission also. Repleted orally and start her on potassium rich diet PCP to consider checking potassium level during the follow-up visit Outpatient follow-up with nephrology as recommended   *Altered mental status-resolved This happened at home, patient is currently alert and oriented. UA is negative, chest x-ray is clear, influenza test is negative.  Son denies any fever.  *Recent GI bleed Continue PPI twice daily and continue anticoagulation as recommended by GI.  *Uncontrolled hypertension Continue amlodipine and clonidine, added metoprolol.  *Tachycardia Resolved after adding metoprolol  * hypothyroidism Continue levothyroxine.    Normal TSH.  *History of stroke Patient is on Eliquis, continue the same.  DISCHARGE CONDITIONS:  stable  CONSULTS OBTAINED:     PROCEDURES none   DRUG ALLERGIES:   Allergies  Allergen Reactions  . Clopidogrel Nausea And Vomiting     reported by [place] 11/24/13  . Omeprazole Other (See Comments)    Unknown reaction - reported by [place] 11/24/13    DISCHARGE MEDICATIONS:   Allergies as of 03/27/2018      Reactions   Clopidogrel Nausea And Vomiting    reported by [place] 11/24/13   Omeprazole Other (See Comments)   Unknown reaction - reported by [place] 11/24/13      Medication List    TAKE these medications   amLODipine 10 MG tablet Commonly known as:  NORVASC Take 1 tablet (10 mg total) by mouth daily.  apixaban 2.5 MG Tabs tablet Commonly known as:  ELIQUIS Take 1 tablet (2.5 mg total) by mouth 2 (two) times daily.   aspirin 81 MG EC tablet Take 1 tablet (81 mg total) by mouth daily.   b complex vitamins tablet Take 1 tablet by mouth daily.   B-complex with vitamin C tablet Take 1 tablet by mouth daily. Start  taking on:  March 28, 2018   bisacodyl 5 MG EC tablet Commonly known as:  DUCODYL Take 2 tablets (10 mg total) by mouth daily.   cloNIDine 0.2 MG tablet Commonly known as:  CATAPRES Take 1 tablet (0.2 mg total) by mouth 2 (two) times daily. What changed:  how much to take   collagenase ointment Commonly known as:  SANTYL Apply topically daily.   gabapentin 100 MG capsule Commonly known as:  NEURONTIN Take 100 mg by mouth 3 (three) times daily.   HYDROcodone-acetaminophen 5-325 MG tablet Commonly known as:  NORCO/VICODIN Take 1 tablet by mouth every 6 (six) hours as needed for moderate pain or severe pain.   lansoprazole 30 MG capsule Commonly known as:  PREVACID Take 1 capsule (30 mg total) by mouth daily.   levothyroxine 25 MCG tablet Commonly known as:  SYNTHROID, LEVOTHROID Take 25 mcg by mouth daily before breakfast.   Melatonin 3 MG Tabs Take 3 mg by mouth at bedtime as needed (sleep).   metoprolol tartrate 25 MG tablet Commonly known as:  LOPRESSOR Take 1 tablet (25 mg total) by mouth 2 (two) times daily.   ondansetron 4 MG disintegrating tablet Commonly known as:  ZOFRAN-ODT Take 1 tablet (4 mg total) by mouth every 8 (eight) hours as needed for nausea or vomiting.   ondansetron 4 MG tablet Commonly known as:  ZOFRAN Take 4 mg by mouth every 8 (eight) hours as needed for nausea.   polyethylene glycol packet Commonly known as:  MIRALAX / GLYCOLAX Take 17 g by mouth daily as needed for moderate constipation.   potassium chloride SA 20 MEQ tablet Commonly known as:  K-DUR,KLOR-CON Take 20 mEq by mouth daily.   simvastatin 40 MG tablet Commonly known as:  ZOCOR Take 1 tablet (40 mg total) by mouth daily at 6 PM.   venlafaxine XR 150 MG 24 hr capsule Commonly known as:  EFFEXOR-XR Take 150 mg by mouth at bedtime.   Vitamin D3 25 MCG (1000 UT) Caps Take 3,000 Units by mouth at bedtime.        DISCHARGE INSTRUCTIONS:   Follow-up with primary  care physician in 3 days and please check potassium  Follow-up with nephrology Dr. Zollie Scale in a week DIET:  Cardiac diet  DISCHARGE CONDITION:  Stable  ACTIVITY:  Activity as tolerated  OXYGEN:  Home Oxygen: No.   Oxygen Delivery: room air  DISCHARGE LOCATION:  home   If you experience worsening of your admission symptoms, develop shortness of breath, life threatening emergency, suicidal or homicidal thoughts you must seek medical attention immediately by calling 911 or calling your MD immediately  if symptoms less severe.  You Must read complete instructions/literature along with all the possible adverse reactions/side effects for all the Medicines you take and that have been prescribed to you. Take any new Medicines after you have completely understood and accpet all the possible adverse reactions/side effects.   Please note  You were cared for by a hospitalist during your hospital stay. If you have any questions about your discharge medications or the care you received while you were  in the hospital after you are discharged, you can call the unit and asked to speak with the hospitalist on call if the hospitalist that took care of you is not available. Once you are discharged, your primary care physician will handle any further medical issues. Please note that NO REFILLS for any discharge medications will be authorized once you are discharged, as it is imperative that you return to your primary care physician (or establish a relationship with a primary care physician if you do not have one) for your aftercare needs so that they can reassess your need for medications and monitor your lab values.     Today  Chief Complaint  Patient presents with  . Altered Mental Status   Patient is feeling much better reports she is at her baseline and wants to go home.  Oral potassium supplements given, will be seen by dietitian before her discharge  ROS:  CONSTITUTIONAL: Denies fevers, chills.  Denies any fatigue, weakness.  EYES: Denies blurry vision, double vision, eye pain. EARS, NOSE, THROAT: Denies tinnitus, ear pain, hearing loss. RESPIRATORY: Denies cough, wheeze, shortness of breath.  CARDIOVASCULAR: Denies chest pain, palpitations, edema.  GASTROINTESTINAL: Denies nausea, vomiting, diarrhea, abdominal pain. Denies bright red blood per rectum. GENITOURINARY: Denies dysuria, hematuria. ENDOCRINE: Denies nocturia or thyroid problems. HEMATOLOGIC AND LYMPHATIC: Denies easy bruising or bleeding. SKIN: Denies rash or lesion. MUSCULOSKELETAL: Denies pain in neck, back, shoulder, knees, hips or arthritic symptoms.  NEUROLOGIC: Denies paralysis, paresthesias.  PSYCHIATRIC: Denies anxiety or depressive symptoms.   VITAL SIGNS:  Blood pressure (!) 143/56, pulse 83, temperature 97.9 F (36.6 C), temperature source Oral, resp. rate 16, height 5' (1.524 m), weight 50 kg, SpO2 99 %.  I/O:    Intake/Output Summary (Last 24 hours) at 03/27/2018 1413 Last data filed at 03/27/2018 1411 Gross per 24 hour  Intake 500 ml  Output 1300 ml  Net -800 ml    PHYSICAL EXAMINATION:  GENERAL:  85 y.o.-year-old patient lying in the bed with no acute distress.  EYES: Pupils equal, round, reactive to light and accommodation. No scleral icterus. Extraocular muscles intact.  HEENT: Head atraumatic, normocephalic. Oropharynx and nasopharynx clear.  NECK:  Supple, no jugular venous distention. No thyroid enlargement, no tenderness.  LUNGS: Normal breath sounds bilaterally, no wheezing, rales,rhonchi or crepitation. No use of accessory muscles of respiration.  CARDIOVASCULAR: S1, S2 normal. No murmurs, rubs, or gallops.  ABDOMEN: Soft, non-tender, non-distended. Bowel sounds present.  EXTREMITIES: No pedal edema, cyanosis, or clubbing.  NEUROLOGIC: Awake, alert and oriented x3 sensation intact. Gait not checked.  PSYCHIATRIC: The patient is alert and oriented x 3.  SKIN: No obvious rash, lesion, or  ulcer.   DATA REVIEW:   CBC Recent Labs  Lab 03/27/18 0641  WBC 11.0*  HGB 8.4*  HCT 26.8*  PLT 375    Chemistries  Recent Labs  Lab 03/26/18 0950 03/27/18 0641  NA 139 139  K 2.6* 3.2*  CL 100 104  CO2 29 27  GLUCOSE 129* 76  BUN 15 13  CREATININE 1.08* 0.82  CALCIUM 9.3 8.4*  MG 1.8  --   AST 20  --   ALT 15  --   ALKPHOS 56  --   BILITOT 0.8  --     Cardiac Enzymes Recent Labs  Lab 03/26/18 0950  TROPONINI <0.03    Microbiology Results  Results for orders placed or performed during the hospital encounter of 03/26/18  MRSA PCR Screening     Status:  Abnormal   Collection Time: 03/27/18  2:49 AM  Result Value Ref Range Status   MRSA by PCR POSITIVE (A) NEGATIVE Final    Comment:        The GeneXpert MRSA Assay (FDA approved for NASAL specimens only), is one component of a comprehensive MRSA colonization surveillance program. It is not intended to diagnose MRSA infection nor to guide or monitor treatment for MRSA infections. RESULT CALLED TO, READ BACK BY AND VERIFIED WITH: TEMEKA COLES ON 03/27/2018 AT 0423 QSD Performed at [facility], [address] [place], [place] [address]     RADIOLOGY:  Dg Chest Portable 1 View  Result Date: 03/26/2018 CLINICAL DATA:  Altered mental status. Nausea and left arm pain. History of stroke. EXAM: PORTABLE CHEST 1 VIEW COMPARISON:  03/22/2018 and 02/21/2018. FINDINGS: 0948 hours. The heart size and mediastinal contours are stable. There is extensive aortic atherosclerosis and mild tortuosity. The lungs are clear. There is no pleural effusion or pneumothorax. Old fracture of the distal right clavicle noted. No acute osseous findings are seen. IMPRESSION: Stable chest.  No active cardiopulmonary process. Electronically Signed   By: Richardean Sale M.D.   On: 03/26/2018 10:05    EKG:   Orders placed or performed during the hospital encounter of 03/22/18  . ED EKG  . ED EKG      Management plans  discussed with the patient, and son at bedside, they are  in agreement.  CODE STATUS:     Code Status Orders  (From admission, onward)         Start     Ordered   03/26/18 1753  Do not attempt resuscitation (DNR)  Continuous    Question Answer Comment  In the event of cardiac or respiratory ARREST Do not call a "code blue"   In the event of cardiac or respiratory ARREST Do not perform Intubation, CPR, defibrillation or ACLS   In the event of cardiac or respiratory ARREST Use medication by any route, position, wound care, and other measures to relive pain and suffering. May use oxygen, suction and manual treatment of airway obstruction as needed for comfort.      03/26/18 1752        Code Status History    Date Active Date Inactive Code Status Order ID Comments User Context   03/22/2018 1536 03/24/2018 1438 Full Code [id number]  Fritzi Mandes, MD Inpatient   02/21/2018 1200 02/24/2018 1921 Full Code [id number]  Hillary Bow, MD ED   11/06/2017 0040 11/16/2017 2146 DNR [id number]  Lance Coon, MD Inpatient   10/30/2017 1648 11/03/2017 1703 DNR [id number]  Dustin Flock, MD Inpatient   10/12/2017 2234 10/15/2017 2130 Full Code [id number]  Opyd, Ilene Qua, MD ED      TOTAL TIME TAKING CARE OF THIS PATIENT: 43  minutes.   Note: This dictation was prepared with Dragon dictation along with smaller phrase technology. Any transcriptional errors that result from this process are unintentional.   @[facility] @  on 03/27/2018 at 2:13 PM  Between 7am to 6pm - Pager - [phone]  After 6pm go to [email or web address] - password [email or web address] [facility]  Office  [phone]  CC: Primary care physician; Baxter Hire, MD

## 2018-03-27 NOTE — Progress Notes (Signed)
   03/27/18 1500  Clinical Encounter Type  Visited With Patient and family together  Visit Type Code  Referral From Nurse  Spiritual Encounters  Spiritual Needs Emotional  Stress Factors  Patient Stress Factors Exhausted;Health changes  Family Stress Factors Health changes  Ch responded to a RRT. Pt was in distress refusing to get her blood drawn for lab tests. Pt's son was out in the hallway. Ch and the care team comforted pt and encouraged her to comply and pt did. Ch left the room in order to respond to another page. Ch will follow up with the family later.

## 2018-03-27 NOTE — Significant Event (Signed)
Rapid Response Event Note  Overview:   Event Type: Neurologic, Cardiac  Initial Focused Assessment: called for rapid response, possible vasovagal/ syncopal episode while using toilet. Pt carried back to bed, came around within a few minutes insisting "she is going home today". VSS. Son at bedside convincing his mother to stay in hospital at this time.   Interventions: Dr Margaretmary Eddy notified, ordered cardio consult, head ct, and echo.   Plan of Care (if not transferred): Meghan RN to call if further assistance needed.  Event Summary: Name of Physician Notified: Gouru at 1440  Name of Consulting Physician Notified: kowalski at    Outcome: Stayed in room and stabalized     Sonya [name] A

## 2018-03-27 NOTE — Progress Notes (Signed)
[facility] [place] NAME: Sonya [name]    MR#:  [id number]  DATE OF BIRTH:  06-06-1932  SUBJECTIVE:  CHIEF COMPLAINT: Patient is doing fine back to her normal baseline and wanted to go home but just prior to the discharge patient was nauseous and had a syncopal episode.  Son was concerned as this is happening multiple times.  Rapid response was called for syncopal episode and discharge got canceled  REVIEW OF SYSTEMS:  CONSTITUTIONAL: No fever, fatigue or weakness.  EYES: No blurred or double vision.  EARS, NOSE, AND THROAT: No tinnitus or ear pain.  RESPIRATORY: No cough, shortness of breath, wheezing or hemoptysis.  CARDIOVASCULAR: No chest pain, orthopnea, edema.  GASTROINTESTINAL: No nausea, vomiting, diarrhea or abdominal pain.  GENITOURINARY: No dysuria, hematuria.  ENDOCRINE: No polyuria, nocturia,  HEMATOLOGY: No anemia, easy bruising or bleeding SKIN: No rash or lesion. MUSCULOSKELETAL: No joint pain or arthritis.   NEUROLOGIC: No tingling, numbness, weakness.  PSYCHIATRY: No anxiety or depression.   DRUG ALLERGIES:   Allergies  Allergen Reactions  . Clopidogrel Nausea And Vomiting     reported by [place] 11/24/13  . Omeprazole Other (See Comments)    Unknown reaction - reported by [place] 11/24/13    VITALS:  Blood pressure (!) 133/58, pulse 61, temperature 97.9 F (36.6 C), temperature source Oral, resp. rate 20, height 5' (1.524 m), weight 50 kg, SpO2 100 %.  PHYSICAL EXAMINATION:  GENERAL:  85 y.o.-year-old patient lying in the bed with no acute distress.  EYES: Pupils equal, round, reactive to light and accommodation. No scleral icterus. Extraocular muscles intact.  HEENT: Head atraumatic, normocephalic. Oropharynx and nasopharynx clear.  NECK:  Supple, no jugular venous distention. No thyroid enlargement, no tenderness.  LUNGS: Normal breath sounds bilaterally, no  wheezing, rales,rhonchi or crepitation. No use of accessory muscles of respiration.  CARDIOVASCULAR: S1, S2 normal. No murmurs, rubs, or gallops.  ABDOMEN: Soft, nontender, nondistended. Bowel sounds present.  EXTREMITIES: No pedal edema, cyanosis, or clubbing.  NEUROLOGIC: Awake alert and oriented x3 sensation intact. Gait not checked.  PSYCHIATRIC: The patient is alert and oriented x 3.  SKIN: No obvious rash, lesion, or ulcer.    LABORATORY PANEL:   CBC Recent Labs  Lab 03/27/18 0641  WBC 11.0*  HGB 8.4*  HCT 26.8*  PLT 375   ------------------------------------------------------------------------------------------------------------------  Chemistries  Recent Labs  Lab 03/26/18 0950 03/27/18 0641  NA 139 139  K 2.6* 3.2*  CL 100 104  CO2 29 27  GLUCOSE 129* 76  BUN 15 13  CREATININE 1.08* 0.82  CALCIUM 9.3 8.4*  MG 1.8  --   AST 20  --   ALT 15  --   ALKPHOS 56  --   BILITOT 0.8  --    ------------------------------------------------------------------------------------------------------------------  Cardiac Enzymes Recent Labs  Lab 03/26/18 0950  TROPONINI <0.03   ------------------------------------------------------------------------------------------------------------------  RADIOLOGY:  Dg Chest Portable 1 View  Result Date: 03/26/2018 CLINICAL DATA:  Altered mental status. Nausea and left arm pain. History of stroke. EXAM: PORTABLE CHEST 1 VIEW COMPARISON:  03/22/2018 and 02/21/2018. FINDINGS: 0948 hours. The heart size and mediastinal contours are stable. There is extensive aortic atherosclerosis and mild tortuosity. The lungs are clear. There is no pleural effusion or pneumothorax. Old fracture of the distal right clavicle noted. No acute osseous findings are seen. IMPRESSION: Stable chest.  No active cardiopulmonary process. Electronically Signed   By: Gwyndolyn Saxon  Lin Landsman M.D.   On: 03/26/2018 10:05    EKG:   Orders placed or performed during the  hospital encounter of 03/22/18  . ED EKG  . ED EKG    ASSESSMENT AND PLAN:   Syncope looks like vasovagal  Telemetry CT head stat Carotid Dopplers and echocardiogram Cardiology consult to Dr. Nehemiah Massed Neurochecks, PT evaluation Check orthostatics Check CBC stat given the recent history of GI bleed   Hypokalemia and generalized weakness Likely malnutrition-dietitian consult Normal magnesium Patient had significant and recurrent hypokalemia during last admission also. Repleted orally and start her on potassium rich diet PCP to consider checking potassium level during the follow-up visit Outpatient follow-up with nephrology as recommended   *Altered mental status-resolved This happened at home, patient is currently alert and oriented. UA is negative, chest x-ray is clear, influenza test is negative. Son denies any fever.  *Recent GI bleed Continue PPI twice daily and continue anticoagulation as recommended by GI.  *Uncontrolled hypertension Continue amlodipine and clonidine, added metoprolol.  *Tachycardia Resolved after adding metoprolol  * hypothyroidism Continue levothyroxine.   Normal TSH.  *History of stroke Patient is on Eliquis, continue the same.   All the records are reviewed and case discussed with Care Management/Social Workerr. Management plans discussed with the patient, family and they are in agreement.  CODE STATUS: DNR  TOTAL TIME TAKING CARE OF THIS PATIENT: 36  minutes.   POSSIBLE D/C IN 1-2 DAYS, DEPENDING ON CLINICAL CONDITION.  Note: This dictation was prepared with Dragon dictation along with smaller phrase technology. Any transcriptional errors that result from this process are unintentional.   Nicholes Mango M.D on 03/27/2018 at 2:50 PM  Between 7am to 6pm - Pager - [phone] After 6pm go to [email or web address] - password [email or web address] [facility]  Office  [phone]  CC: Primary care physician; Baxter Hire,  MD

## 2018-03-28 ENCOUNTER — Inpatient Hospital Stay
Admit: 2018-03-28 | Discharge: 2018-03-28 | Disposition: A | Discharge status: Home / Self Care | Payer: Medicare PPO | Attending: Internal Medicine | Admitting: Internal Medicine

## 2018-03-28 ENCOUNTER — Inpatient Hospital Stay: Payer: Medicare PPO

## 2018-03-28 LAB — BASIC METABOLIC PANEL
Anion gap: 7 (ref 5–15)
BUN: 15 mg/dL (ref 8–23)
CO2: 27 mmol/L (ref 22–32)
Calcium: 8.5 mg/dL — ABNORMAL LOW (ref 8.9–10.3)
Chloride: 103 mmol/L (ref 98–111)
Creatinine, Ser: 0.9 mg/dL (ref 0.44–1.00)
GFR calc Af Amer: >60 mL/min (ref >60)
GFR calc non Af Amer: 59 mL/min — ABNORMAL LOW (ref >60)
Glucose, Bld: 95 mg/dL (ref 70–99)
Potassium: 3.2 mmol/L — ABNORMAL LOW (ref 3.5–5.1)
SODIUM: 137 mmol/L (ref 135–145)

## 2018-03-28 LAB — CBC
HCT: 26.8 % — ABNORMAL LOW (ref 36.0–46.0)
Hemoglobin: 8.5 g/dL — ABNORMAL LOW (ref 12.0–15.0)
MCH: 26.5 pg (ref 26.0–34.0)
MCHC: 31.7 g/dL (ref 30.0–36.0)
MCV: 83.5 fL (ref 80.0–100.0)
Platelets: 373 10*3/uL (ref 150–400)
RBC: 3.21 MIL/uL — ABNORMAL LOW (ref 3.87–5.11)
RDW: 16.3 % — ABNORMAL HIGH (ref 11.5–15.5)
WBC: 15.3 10*3/uL — ABNORMAL HIGH (ref 4.0–10.5)
nRBC: 0 % (ref 0.0–0.2)

## 2018-03-28 LAB — MAGNESIUM: Magnesium: 1.6 mg/dL — ABNORMAL LOW (ref 1.7–2.4)

## 2018-03-28 MED ORDER — MAGNESIUM SULFATE 2 GM/50ML IV SOLN
2.0000 g | Freq: Once | INTRAVENOUS | Status: AC
  Administered 2018-03-28: 2 g via INTRAVENOUS
  Filled 2018-03-28: qty 50

## 2018-03-28 MED ORDER — AMLODIPINE BESYLATE 5 MG PO TABS
5.0000 mg | ORAL_TABLET | Freq: Every day | ORAL | Status: DC
  Administered 2018-03-29 – 2018-04-01 (×4): 5 mg via ORAL
  Filled 2018-03-28 (×4): qty 1

## 2018-03-28 MED ORDER — IOHEXOL 350 MG/ML SOLN
75.0000 mL | Freq: Once | INTRAVENOUS | Status: AC | PRN
  Administered 2018-03-28: 10:00:00 75 mL via INTRAVENOUS

## 2018-03-28 MED ORDER — POTASSIUM CHLORIDE 10 MEQ/100ML IV SOLN
10.0000 meq | INTRAVENOUS | Status: AC
  Administered 2018-03-28 (×4): 10 meq via INTRAVENOUS
  Filled 2018-03-28 (×2): qty 100

## 2018-03-28 NOTE — Progress Notes (Signed)
*  PRELIMINARY RESULTS* Echocardiogram 2D Echocardiogram has been performed.  [place] 03/28/2018, 2:49 PM

## 2018-03-28 NOTE — Progress Notes (Signed)
PHARMACY CONSULT NOTE - FOLLOW UP  Pharmacy Consult for Electrolyte Monitoring and Replacement   Recent Labs: Potassium (mmol/L)  Date Value  03/28/2018 3.2 (L)   Magnesium (mg/dL)  Date Value  03/28/2018 1.6 (L)   Calcium (mg/dL)  Date Value  03/28/2018 8.5 (L)   Albumin (g/dL)  Date Value  03/26/2018 3.8   Sodium (mmol/L)  Date Value  03/28/2018 137     Assessment: 85 year old female admitted with hypokalemia. Patient on potassium 40 mEq PO BID with potassium remaining approximately 3.2. Per Nephrology, suspect nutritional deficiency.  Goal of Therapy:  Potassium 3.5-4 Magnesium ~ 2  Plan:  Mg 1.6: magnesium 2 g IV x 1 K 3.2: patient receiving 40 mEq PO BID. Nephrology ordered additional 10 mEq IV x 4.  Will follow up with morning labs.  Tawnya Crook, PharmD Pharmacy Resident  03/28/2018 12:51 PM

## 2018-03-28 NOTE — Consult Note (Signed)
[facility] CONSULT NOTE    Date: 03/28/2018                  Patient Name:  Ieva Much  MRN: [id number]  DOB: [birth year]  Age / Sex: 85 y.o., female         PCP: Gracelyn Nurse, MD                 Service Requesting Consult: Hospitalist                 Reason for Consult: Hypokalemia            History of Present Illness: Patient is a 85 y.o. female with a PMHx of coronary artery disease, GERD, history of breast cancer, hyperlipidemia, hypertension, history of CVA, carpal tunnel syndrome, history of left-sided weakness, who was admitted to [facility] on 03/26/2018 for evaluation of altered mental status.  Patient had recent hospitalization for GI bleed which showed angiectatic mucosa and lesions in the stomach treated with cautery.  Patient has had some recent nausea.  We are asked to see her for evaluation management of hypokalemia which is apparently recurrent.  Serum potassium serum potassium currently 3.2 but has been as low as 2.6.  She has been receiving oral potassium.  Serum magnesium was normal at 1.8.  Patient is not on any diuretics at the moment.   Medications: Outpatient medications: Medications Prior to Admission  Medication Sig Dispense Refill Last Dose  . amLODipine (NORVASC) 10 MG tablet Take 1 tablet (10 mg total) by mouth daily. 30 tablet 0 03/25/2018 at 0800  . apixaban (ELIQUIS) 2.5 MG TABS tablet Take 1 tablet (2.5 mg total) by mouth 2 (two) times daily. 60 tablet 0 03/25/2018 at 1900  . aspirin EC 81 MG EC tablet Take 1 tablet (81 mg total) by mouth daily. 180 tablet 0 03/25/2018 at 0800  . b complex vitamins tablet Take 1 tablet by mouth daily.   03/25/2018 at 0800  . bisacodyl (DUCODYL) 5 MG EC tablet Take 2 tablets (10 mg total) by mouth daily. 30 tablet 0 03/25/2018 at 0800  . Cholecalciferol (VITAMIN D3) 1000 units CAPS Take 3,000 Units by mouth at bedtime.    03/25/2018 at 2000  . cloNIDine (CATAPRES) 0.2 MG tablet Take 1 tablet (0.2 mg total) by  mouth 2 (two) times daily. (Patient taking differently: Take 0.1 mg by mouth 2 (two) times daily. ) 60 tablet 3 03/25/2018 at 1900  . collagenase (SANTYL) ointment Apply topically daily. 15 g 0 03/25/2018 at 0800  . gabapentin (NEURONTIN) 100 MG capsule Take 100 mg by mouth 3 (three) times daily.   03/25/2018 at 1900  . HYDROcodone-acetaminophen (NORCO/VICODIN) 5-325 MG tablet Take 1 tablet by mouth every 6 (six) hours as needed for moderate pain or severe pain. 30 tablet 0 Past Week at PRN  . lansoprazole (PREVACID) 30 MG capsule Take 1 capsule (30 mg total) by mouth daily. 30 capsule 1 03/25/2018 at 0800  . levothyroxine (SYNTHROID, LEVOTHROID) 25 MCG tablet Take 25 mcg by mouth daily before breakfast.    03/25/2018 at 0700  . Melatonin 3 MG TABS Take 3 mg by mouth at bedtime as needed (sleep).    Unknown at PRN  . ondansetron (ZOFRAN) 4 MG tablet Take 4 mg by mouth every 8 (eight) hours as needed for nausea.   03/26/2018 at PRN  . polyethylene glycol (MIRALAX / GLYCOLAX) packet Take 17 g by mouth daily as needed for moderate  constipation. 30 each 0 Unknown at PRN  . potassium chloride SA (K-DUR,KLOR-CON) 20 MEQ tablet Take 20 mEq by mouth daily.   03/25/2018 at 0800  . simvastatin (ZOCOR) 40 MG tablet Take 1 tablet (40 mg total) by mouth daily at 6 PM. 30 tablet 0 03/25/2018 at 1900  . venlafaxine XR (EFFEXOR-XR) 150 MG 24 hr capsule Take 150 mg by mouth at bedtime.   03/25/2018 at 2000  . ondansetron (ZOFRAN-ODT) 4 MG disintegrating tablet Take 1 tablet (4 mg total) by mouth every 8 (eight) hours as needed for nausea or vomiting. 20 tablet 0     Current medications: Current Facility-Administered Medications  Medication Dose Route Frequency Provider Last Rate Last Dose  . [START ON 03/29/2018] amLODipine (NORVASC) tablet 5 mg  5 mg Oral Daily Dalia Heading, MD      . apixaban Everlene Balls) tablet 2.5 mg  2.5 mg Oral BID Altamese Dilling, MD   2.5 mg at 03/28/18 1152  . aspirin EC tablet 81 mg  81 mg Oral  Daily Altamese Dilling, MD   81 mg at 03/28/18 1152  . atorvastatin (LIPITOR) tablet 20 mg  20 mg Oral q1800 Hallaji, Mardene Speak, RPH   20 mg at 03/28/18 1152  . B-complex with vitamin C tablet 1 tablet  1 tablet Oral Daily Altamese Dilling, MD   1 tablet at 03/28/18 1152  . bisacodyl (DULCOLAX) EC tablet 10 mg  10 mg Oral Daily Altamese Dilling, MD   10 mg at 03/28/18 1153  . cholecalciferol (VITAMIN D) tablet 3,000 Units  3,000 Units Oral QHS Altamese Dilling, MD   3,000 Units at 03/27/18 2155  . cloNIDine (CATAPRES) tablet 0.1 mg  0.1 mg Oral BID Altamese Dilling, MD   0.1 mg at 03/28/18 1153  . docusate sodium (COLACE) capsule 100 mg  100 mg Oral BID PRN Altamese Dilling, MD      . gabapentin (NEURONTIN) capsule 100 mg  100 mg Oral TID Altamese Dilling, MD   100 mg at 03/28/18 1152  . HYDROcodone-acetaminophen (NORCO/VICODIN) 5-325 MG per tablet 1 tablet  1 tablet Oral Q6H PRN Altamese Dilling, MD   1 tablet at 03/26/18 2233  . levothyroxine (SYNTHROID, LEVOTHROID) tablet 25 mcg  25 mcg Oral QAC breakfast Altamese Dilling, MD   25 mcg at 03/28/18 1152  . magnesium sulfate IVPB 2 g 50 mL  2 g Intravenous Once Gouru, Aruna, MD 50 mL/hr at 03/28/18 1149 2 g at 03/28/18 1149  . Melatonin TABS 2.5 mg  2.5 mg Oral QHS PRN Altamese Dilling, MD      . metoprolol tartrate (LOPRESSOR) tablet 25 mg  25 mg Oral BID Altamese Dilling, MD   25 mg at 03/28/18 1152  . ondansetron (ZOFRAN-ODT) disintegrating tablet 4 mg  4 mg Oral Q8H PRN Altamese Dilling, MD   4 mg at 03/27/18 2155  . pantoprazole (PROTONIX) EC tablet 20 mg  20 mg Oral Daily Altamese Dilling, MD   20 mg at 03/28/18 1152  . potassium chloride 10 mEq in 100 mL IVPB  10 mEq Intravenous Q1 Hr x 4 Amyiah Gaba, MD 100 mL/hr at 03/28/18 1152 10 mEq at 03/28/18 1152  . potassium chloride SA (K-DUR,KLOR-CON) CR tablet 40 mEq  40 mEq Oral BID Altamese Dilling, MD   40 mEq  at 03/28/18 1152  . venlafaxine XR (EFFEXOR-XR) 24 hr capsule 150 mg  150 mg Oral QHS Altamese Dilling, MD   150 mg at 03/27/18 2155  Allergies: Allergies  Allergen Reactions  . Clopidogrel Nausea And Vomiting     reported by [facility] 11/24/13  . Omeprazole Other (See Comments)    Unknown reaction - reported by [facility] 11/24/13      Past Medical History: Past Medical History:  Diagnosis Date  . CAD (coronary artery disease)   . Carpal tunnel syndrome   . GERD (gastroesophageal reflux disease)   . History of shingles   . Hormone receptor positive breast cancer (HCC)   . Hyperlipemia   . Hypertension   . Stroke [facility])      Past Surgical History: Past Surgical History:  Procedure Laterality Date  . ABDOMINAL HYSTERECTOMY    . APPENDECTOMY    . cornoary angioplasty    . ENDOSCOPIC RETROGRADE CHOLANGIOPANCREATOGRAPHY (ERCP) WITH PROPOFOL N/A 11/10/2017   Procedure: ENDOSCOPIC RETROGRADE CHOLANGIOPANCREATOGRAPHY (ERCP) WITH PROPOFOL;  Surgeon: Midge Minium, MD;  Location: [facility] ENDOSCOPY;  Service: Endoscopy;  Laterality: N/A;  . ESOPHAGOGASTRODUODENOSCOPY N/A 03/23/2018   Procedure: ESOPHAGOGASTRODUODENOSCOPY (EGD);  Surgeon: Toney Reil, MD;  Location: [facility] ENDOSCOPY;  Service: Gastroenterology;  Laterality: N/A;  . ESOPHAGOGASTRODUODENOSCOPY (EGD) WITH PROPOFOL N/A 11/06/2017   Procedure: ESOPHAGOGASTRODUODENOSCOPY (EGD) WITH PROPOFOL;  Surgeon: Midge Minium, MD;  Location: [facility] ENDOSCOPY;  Service: Endoscopy;  Laterality: N/A;     Family History: Family History  Problem Relation Age of Onset  . Hypertension Son   . Ovarian cancer Mother   . Stroke Father   . Stroke Son      Social History: Social History   Socioeconomic History  . Marital status: Married    Spouse name: Not on file  . Number of children: Not on file  . Years of education: Not on file  . Highest education level: Not on file  Occupational  History  . Not on file  Social Needs  . Financial resource strain: Not on file  . Food insecurity:    Worry: Not on file    Inability: Not on file  . Transportation needs:    Medical: Not on file    Non-medical: Not on file  Tobacco Use  . Smoking status: Former Smoker    Packs/day: 0.50    Years: 20.00    Pack years: 10.00    Types: Cigarettes    Last attempt to quit: 02/17/1998    Years since quitting: 20.1  . Smokeless tobacco: Never Used  Substance and Sexual Activity  . Alcohol use: Not Currently  . Drug use: Never  . Sexual activity: Not on file  Lifestyle  . Physical activity:    Days per week: Not on file    Minutes per session: Not on file  . Stress: Not on file  Relationships  . Social connections:    Talks on phone: Not on file    Gets together: Not on file    Attends religious service: Not on file    Active member of club or organization: Not on file    Attends meetings of clubs or organizations: Not on file    Relationship status: Not on file  . Intimate partner violence:    Fear of current or ex partner: Not on file    Emotionally abused: Not on file    Physically abused: Not on file    Forced sexual activity: Not on file  Other Topics Concern  . Not on file  Social History Narrative  . Not on file     Review of Systems: Unable  to obtain secondary to altered mental status  Vital Signs: Blood pressure (!) 142/59, pulse 76, temperature 98 F (36.7 C), temperature source Oral, resp. rate 17, height 5' (1.524 m), weight 50 kg, SpO2 100 %.  Weight trends: Filed Weights   03/26/18 0944  Weight: 50 kg    Physical Exam: General: NAD, laying in bed  Head: Normocephalic, atraumatic.  Eyes: Anicteric, EOMI  Nose: Mucous membranes moist, not inflammed, nonerythematous.  Throat: Oropharynx nonerythematous, no exudate appreciated.   Neck: Supple, trachea midline.  Lungs:  Normal respiratory effort. Clear to auscultation BL without crackles or wheezes.   Heart: RRR. S1 and S2 normal without gallop, murmur, or rubs.  Abdomen:  BS normoactive. Soft, Nondistended, non-tender.  No masses or organomegaly.  Extremities: No pretibial edema.  Neurologic: Awake, but confused  Skin: No visible rashes, scars.    Lab results: Basic Metabolic Panel: Recent Labs  Lab 03/23/18 0739  03/26/18 0950 03/27/18 0641 03/27/18 1443 03/28/18 0418  NA 144  --  139 139  --  137  K 2.9*   < > 2.6* 3.2* 3.3* 3.2*  CL 110  --  100 104  --  103  CO2 26  --  29 27  --  27  GLUCOSE 96  --  129* 76  --  95  BUN 26*  --  15 13  --  15  CREATININE 0.91  --  1.08* 0.82  --  0.90  CALCIUM 8.3*  --  9.3 8.4*  --  8.5*  MG 1.8  --  1.8  --   --  1.6*   < > = values in this interval not displayed.    Liver Function Tests: Recent Labs  Lab 03/22/18 0930 03/26/18 0950  AST 25 20  ALT 15 15  ALKPHOS 55 56  BILITOT 0.6 0.8  PROT 6.2* 6.7  ALBUMIN 3.0* 3.8   Recent Labs  Lab 03/22/18 0930  LIPASE 34   No results for input(s): AMMONIA in the last 168 hours.  CBC: Recent Labs  Lab 03/22/18 0930  03/26/18 0950 03/27/18 0641 03/27/18 1443 03/28/18 0418  WBC 19.3*  --  13.2* 11.0*  --  15.3*  NEUTROABS 16.3*  --   --   --   --   --   HGB 10.0*   < > 9.3* 8.4* 8.9* 8.5*  HCT 31.7*   < > 29.3* 26.8* 28.9* 26.8*  MCV 83.9  --  84.4 84.0  --  83.5  PLT 430*  --  469* 375  --  373   < > = values in this interval not displayed.    Cardiac Enzymes: Recent Labs  Lab 03/26/18 0950  TROPONINI <0.03    BNP: Invalid input(s): POCBNP  CBG: Recent Labs  Lab 03/27/18 1440  GLUCAP 114*    Microbiology: Results for orders placed or performed during the hospital encounter of 03/26/18  MRSA PCR Screening     Status: Abnormal   Collection Time: 03/27/18  2:49 AM  Result Value Ref Range Status   MRSA by PCR POSITIVE (A) NEGATIVE Final    Comment:        The GeneXpert MRSA Assay (FDA approved for NASAL specimens only), is one component of  a comprehensive MRSA colonization surveillance program. It is not intended to diagnose MRSA infection nor to guide or monitor treatment for MRSA infections. RESULT CALLED TO, READ BACK BY AND VERIFIED WITH: TEMEKA COLES ON 03/27/2018 AT 0423 QSD Performed  at [facility], [address] [place], [place] [address]     Coagulation Studies: No results for input(s): LABPROT, INR in the last 72 hours.  Urinalysis: Recent Labs    03/26/18 0950  COLORURINE YELLOW*  LABSPEC 1.008  PHURINE 7.0  GLUCOSEU NEGATIVE  HGBUR NEGATIVE  BILIRUBINUR NEGATIVE  KETONESUR NEGATIVE  PROTEINUR NEGATIVE  NITRITE NEGATIVE  LEUKOCYTESUR NEGATIVE      Imaging: Ct Head Wo Contrast  Result Date: 03/27/2018 CLINICAL DATA:  Recurrent syncope EXAM: CT HEAD WITHOUT CONTRAST TECHNIQUE: Contiguous axial images were obtained from the base of the skull through the vertex without intravenous contrast. COMPARISON:  02/21/2018 FINDINGS: Brain: Remote right MCA territory infarct affecting the posterior insula, lateral frontal lobe, and anterior parietal lobe. No acute or interval infarct. No hemorrhage, hydrocephalus, or masslike finding. Cerebral volume loss without specific pattern Vascular: No hyperdense vessel or unexpected calcification. Skull: Negative Sinuses/Orbits: Bilateral cataract resection IMPRESSION: 1. No acute finding. 2. Large remote right MCA branch infarct. Electronically Signed   By: Marnee Spring M.D.   On: 03/27/2018 15:24   Ct Angio Neck W Or Wo Contrast  Result Date: 03/28/2018 CLINICAL DATA:  Recurrent syncope EXAM: CT ANGIOGRAPHY NECK TECHNIQUE: Multidetector CT imaging of the neck was performed using the standard protocol during bolus administration of intravenous contrast. Multiplanar CT image reconstructions and MIPs were obtained to evaluate the vascular anatomy. Carotid stenosis measurements (when applicable) are obtained utilizing NASCET criteria, using the distal internal  carotid diameter as the denominator. CONTRAST:  75mL OMNIPAQUE IOHEXOL 350 MG/ML SOLN COMPARISON:  10/13/2017 FINDINGS: Aortic arch: Severe atherosclerosis with irregular plaque diffusely. Aneurysmal widening of the transverse arch 3.7 cm, non progressed. 2 vessel branching Right carotid system: Diffuse atheromatous plaque on the brachiocephalic, common carotid, and at the bifurcation. No ulceration or flow limiting stenosis. Left carotid system: Diffuse atheromatous wall thickening of the common carotid. Irregular mixed density plaque at the ICA bulb with 70% stenosis based on axial slice measurement. ICA looping. Vertebral arteries: Extensive subclavian atherosclerotic plaque. Calcified plaque at the proximal left subclavian causes 65% stenosis. There is severe atheromatous narrowing of both V1 segments. Otherwise the vertebral arteries are widely patent to the basilar. Skeleton: Cervical spine degeneration. Healed right distal clavicle fracture. Other neck: Interval increased density and volume loss of the right parotid. No surrounding inflammation or discrete mass is seen. Upper chest: No acute finding.  Mild emphysema. IMPRESSION: 1. Severe atherosclerosis. 2. In this patient with history of syncope there is bilateral advanced vertebral origin stenosis and ~65% narrowing of the proximal left subclavian. 3. Bilateral cervical carotid atherosclerosis with up to 70% stenosis at the left ICA bulb. 4. 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 Electronically Signed   By: Marnee Spring M.D.   On: 03/28/2018 10:28   US Carotid Bilateral  Result Date: 03/28/2018 CLINICAL DATA:  85 year old female history of syncope. EXAM: BILATERAL CAROTID DUPLEX ULTRASOUND TECHNIQUE: Wallace Cullens scale imaging, color Doppler and duplex ultrasound were performed of bilateral carotid and vertebral arteries in the neck. COMPARISON:  No prior duplex FINDINGS: Criteria:  Quantification of carotid stenosis is based on velocity parameters that correlate the residual internal carotid diameter with NASCET-based stenosis levels, using the diameter of the distal internal carotid lumen as the denominator for stenosis measurement. The following velocity measurements were obtained: RIGHT ICA:  Systolic 125 cm/sec, Diastolic 12 cm/sec CCA:  64 cm/sec SYSTOLIC ICA/CCA RATIO:  2.0 ECA:  61 cm/sec LEFT ICA:  Systolic 202 cm/sec, Diastolic 30 cm/sec CCA:  47 cm/sec SYSTOLIC ICA/CCA RATIO:  4.3 ECA:  60 cm/sec Right Brachial SBP: Not acquired Left Brachial SBP: Not acquired RIGHT CAROTID ARTERY: Calcifications of the right common carotid artery. Intermediate waveform maintained. Moderate heterogeneous and partially calcified plaque at the right carotid bifurcation. Significant shadowing low resistance waveform of the right ICA. Tortuosity RIGHT VERTEBRAL ARTERY: Antegrade flow with low resistance waveform. LEFT CAROTID ARTERY: Calcifications of the left common carotid artery. Intermediate waveform maintained. Moderate heterogeneous and partially calcified plaque at the left carotid bifurcation. Significant shadowing low resistance waveform of the left ICA. Tortuosity LEFT VERTEBRAL ARTERY:  Antegrade flow with postobstructive waveform IMPRESSION: Right: Heterogeneous and partially calcified plaque at the right carotid bifurcation contributes to 50%-69% stenosis by established duplex criteria. Left: Heterogeneous plaque at the left carotid bifurcation, with discordant results regarding degree of stenosis by established duplex criteria. Peak velocity suggests 50%-69% stenosis, with the ICA/ CCA ratio suggesting a higher degree stenosis. If establishing a more accurate degree of stenosis is required, cerebral angiogram should be considered, or as a second best test, CTA. Parvus tardus waveform of the left vertebral artery suggests more proximal stenosis. Signed, Yvone Neu. Reyne Dumas, RPVI [facility] Radiology Electronically Signed   By: Gilmer Mor D.O.   On: 03/28/2018 08:18      Assessment & Plan: Pt is a 85 y.o. female with a PMHx of coronary artery disease, GERD, history of breast cancer, hyperlipidemia, hypertension, history of CVA, carpal tunnel syndrome, history of left-sided weakness, who was admitted to [facility] on 03/26/2018 for evaluation of altered mental status.  1.  Hypokalemia. 2.  Altered mental status 3.  Hypertension.   Plan: Patient seen and evaluated for hypokalemia.  Serum potassium currently up to 3.2.  Suspect most likely that she is nutritionally deficient as she has had recent nausea and diminished p.o. intake.  TSH was performed and was found to be normal.  We will administer potassium chloride 40 mEq IV x1 today.  Follow-up serum potassium tomorrow.  Magnesium was checked earlier and was within the normal range at 1.8.  However patient receiving additional magnesium today as well.  Further plan as patient progresses.  Thanks for consultation.

## 2018-03-28 NOTE — Consult Note (Signed)
Cardiology Consultation Note    Patient ID: Sonya [name], MRN: [id number], DOB/AGE: [birth year] 83 y.o. Admit date: 03/26/2018   Date of Consult: 03/28/2018 Primary Physician: Baxter Hire, MD Primary Cardiologist: Dr. Ubaldo Glassing  Chief Complaint: Dr. Ubaldo Glassing Reason for Consultation: syncope Requesting MD: syncope  HPI: Sonya [name] is a 85 y.o. female with history of right CVA with subsequent left-sided parapsoas who has had several episodes of dizziness and syncope.  She is a slightly difficult historian however her son is with her and cooperates that she has had a number of syncopal episodes predominantly during straining and having a bowel movement.  It appears to occur only at that time.  She has not had any significant syncope on other occasions.  During her hospitalization she was attempting to have a bowel movement which was somewhat difficult she subsequently became unresponsive.  She was recently admitted for an upper GI bleed.  She has not noted any appreciable change in her neurologic status other than during the episodes of syncope.  She does have a history of severe left internal carotid artery disease as well as vertebral disease.  Was seen by vascular surgery and felt to be a possible candidate for intervention.  She is currently stable.  She is currently treated with Eliquis however will hold this in case of possible intervention.  Past Medical History:  Diagnosis Date  . CAD (coronary artery disease)   . Carpal tunnel syndrome   . GERD (gastroesophageal reflux disease)   . History of shingles   . Hormone receptor positive breast cancer ([place])   . Hyperlipemia   . Hypertension   . Stroke [facility])       Surgical History:  Past Surgical History:  Procedure Laterality Date  . ABDOMINAL HYSTERECTOMY    . APPENDECTOMY    . cornoary angioplasty    . ENDOSCOPIC RETROGRADE CHOLANGIOPANCREATOGRAPHY (ERCP) WITH PROPOFOL N/A 11/10/2017   Procedure: ENDOSCOPIC RETROGRADE  CHOLANGIOPANCREATOGRAPHY (ERCP) WITH PROPOFOL;  Surgeon: Lucilla Lame, MD;  Location: [facility] ENDOSCOPY;  Service: Endoscopy;  Laterality: N/A;  . ESOPHAGOGASTRODUODENOSCOPY N/A 03/23/2018   Procedure: ESOPHAGOGASTRODUODENOSCOPY (EGD);  Surgeon: Lin Landsman, MD;  Location: [facility] ENDOSCOPY;  Service: Gastroenterology;  Laterality: N/A;  . ESOPHAGOGASTRODUODENOSCOPY (EGD) WITH PROPOFOL N/A 11/06/2017   Procedure: ESOPHAGOGASTRODUODENOSCOPY (EGD) WITH PROPOFOL;  Surgeon: Lucilla Lame, MD;  Location: [facility] ENDOSCOPY;  Service: Endoscopy;  Laterality: N/A;     Home Meds: Prior to Admission medications   Medication Sig Start Date End Date Taking? Authorizing Provider  amLODipine (NORVASC) 10 MG tablet Take 1 tablet (10 mg total) by mouth daily. 02/24/18  Yes Ojie, Jude, MD  apixaban (ELIQUIS) 2.5 MG TABS tablet Take 1 tablet (2.5 mg total) by mouth 2 (two) times daily. 03/24/18  Yes Epifanio Lesches, MD  aspirin EC 81 MG EC tablet Take 1 tablet (81 mg total) by mouth daily. 11/04/17  Yes Salary, Montell D, MD  b complex vitamins tablet Take 1 tablet by mouth daily.   Yes [provider]  bisacodyl (DUCODYL) 5 MG EC tablet Take 2 tablets (10 mg total) by mouth daily. 02/24/18  Yes Ojie, Jude, MD  Cholecalciferol (VITAMIN D3) 1000 units CAPS Take 3,000 Units by mouth at bedtime.  09/07/17  Yes [provider]  cloNIDine (CATAPRES) 0.2 MG tablet Take 1 tablet (0.2 mg total) by mouth 2 (two) times daily. Patient taking differently: Take 0.1 mg by mouth 2 (two) times daily.  10/15/17  Yes Roxan Hockey, MD  collagenase (SANTYL) ointment  Apply topically daily. 03/24/18  Yes Epifanio Lesches, MD  gabapentin (NEURONTIN) 100 MG capsule Take 100 mg by mouth 3 (three) times daily. 03/15/18 03/15/19 Yes [provider]  HYDROcodone-acetaminophen (NORCO/VICODIN) 5-325 MG tablet Take 1 tablet by mouth every 6 (six) hours as needed for moderate pain or severe pain. 03/24/18  Yes Epifanio Lesches, MD  lansoprazole (PREVACID) 30 MG capsule Take 1 capsule (30 mg total) by mouth daily. 03/24/18 03/24/19 Yes Epifanio Lesches, MD  levothyroxine (SYNTHROID, LEVOTHROID) 25 MCG tablet Take 25 mcg by mouth daily before breakfast.  09/10/17  Yes [provider]  Melatonin 3 MG TABS Take 3 mg by mouth at bedtime as needed (sleep).    Yes [provider]  ondansetron (ZOFRAN) 4 MG tablet Take 4 mg by mouth every 8 (eight) hours as needed for nausea. 03/19/18  Yes [provider]  polyethylene glycol (MIRALAX / GLYCOLAX) packet Take 17 g by mouth daily as needed for moderate constipation. 11/16/17  Yes Wieting, Richard, MD  potassium chloride SA (K-DUR,KLOR-CON) 20 MEQ tablet Take 20 mEq by mouth daily. 03/15/18 03/15/19 Yes [provider]  simvastatin (ZOCOR) 40 MG tablet Take 1 tablet (40 mg total) by mouth daily at 6 PM. 02/24/18  Yes Ojie, Jude, MD  venlafaxine XR (EFFEXOR-XR) 150 MG 24 hr capsule Take 150 mg by mouth at bedtime. 09/10/17  Yes [provider]  B Complex-C (B-COMPLEX WITH VITAMIN C) tablet Take 1 tablet by mouth daily. 03/28/18   Nicholes Mango, MD  metoprolol tartrate (LOPRESSOR) 25 MG tablet Take 1 tablet (25 mg total) by mouth 2 (two) times daily. 03/27/18   Gouru, Illene Silver, MD  ondansetron (ZOFRAN-ODT) 4 MG disintegrating tablet Take 1 tablet (4 mg total) by mouth every 8 (eight) hours as needed for nausea or vomiting. 02/24/18   Otila Back, MD    Inpatient Medications:  . [START ON 03/29/2018] amLODipine  5 mg Oral Daily  . apixaban  2.5 mg Oral BID  . aspirin EC  81 mg Oral Daily  . atorvastatin  20 mg Oral q1800  . B-complex with vitamin C  1 tablet Oral Daily  . bisacodyl  10 mg Oral Daily  . cholecalciferol  3,000 Units Oral QHS  . cloNIDine  0.1 mg Oral BID  . gabapentin  100 mg Oral TID  . levothyroxine  25 mcg Oral QAC breakfast  . metoprolol tartrate  25 mg Oral BID  . pantoprazole  20 mg Oral Daily  . potassium chloride  40  mEq Oral BID  . venlafaxine XR  150 mg Oral QHS     Allergies:  Allergies  Allergen Reactions  . Clopidogrel Nausea And Vomiting     reported by Lynn 11/24/13  . Omeprazole Other (See Comments)    Unknown reaction - reported by Rainier 11/24/13    Social History   Socioeconomic History  . Marital status: Married    Spouse name: Not on file  . Number of children: Not on file  . Years of education: Not on file  . Highest education level: Not on file  Occupational History  . Not on file  Social Needs  . Financial resource strain: Not on file  . Food insecurity:    Worry: Not on file    Inability: Not on file  . Transportation needs:    Medical: Not on file    Non-medical: Not on file  Tobacco Use  . Smoking status: Former  Smoker    Packs/day: 0.50    Years: 20.00    Pack years: 10.00    Types: Cigarettes    Last attempt to quit: 02/17/1998    Years since quitting: 20.1  . Smokeless tobacco: Never Used  Substance and Sexual Activity  . Alcohol use: Not Currently  . Drug use: Never  . Sexual activity: Not on file  Lifestyle  . Physical activity:    Days per week: Not on file    Minutes per session: Not on file  . Stress: Not on file  Relationships  . Social connections:    Talks on phone: Not on file    Gets together: Not on file    Attends religious service: Not on file    Active member of club or organization: Not on file    Attends meetings of clubs or organizations: Not on file    Relationship status: Not on file  . Intimate partner violence:    Fear of current or ex partner: Not on file    Emotionally abused: Not on file    Physically abused: Not on file    Forced sexual activity: Not on file  Other Topics Concern  . Not on file  Social History Narrative  . Not on file     Family History  Problem Relation Age of Onset  . Hypertension Son   . Ovarian cancer Mother   . Stroke Father   . Stroke Son       Review of Systems: A 12-system review of systems was performed and is negative except as noted in the HPI.  Labs: Recent Labs    03/26/18 0950  TROPONINI <0.03   Lab Results  Component Value Date   WBC 15.3 (H) 03/28/2018   HGB 8.5 (L) 03/28/2018   HCT 26.8 (L) 03/28/2018   MCV 83.5 03/28/2018   PLT 373 03/28/2018    Recent Labs  Lab 03/26/18 0950  03/28/18 0418  NA 139   < > 137  K 2.6*   < > 3.2*  CL 100   < > 103  CO2 29   < > 27  BUN 15   < > 15  CREATININE 1.08*   < > 0.90  CALCIUM 9.3   < > 8.5*  PROT 6.7  --   --   BILITOT 0.8  --   --   ALKPHOS 56  --   --   ALT 15  --   --   AST 20  --   --   GLUCOSE 129*   < > 95   < > = values in this interval not displayed.   Lab Results  Component Value Date   CHOL 145 10/13/2017   HDL 38 (L) 10/13/2017   LDLCALC 76 10/13/2017   TRIG 156 (H) 10/13/2017   No results found for: DDIMER  Radiology/Studies:  Ct Abdomen Pelvis Wo Contrast  Result Date: 03/22/2018 CLINICAL DATA:  Nausea and vomiting.  L3 ground emesis. EXAM: CT ABDOMEN AND PELVIS WITHOUT CONTRAST TECHNIQUE: Multidetector CT imaging of the abdomen and pelvis was performed following the standard protocol without IV contrast. COMPARISON:  CT abdomen pelvis dated October 30, 2017. FINDINGS: Lower chest: No acute abnormality. Unchanged ectasia of the descending thoracic aorta measuring up to 3.3 cm. Hepatobiliary: No focal liver abnormality is seen. No gallstones, gallbladder wall thickening, or biliary dilatation. Pancreas: Unremarkable. No pancreatic ductal dilatation or surrounding inflammatory changes. Spleen: Normal in size. Unchanged 1.9 cm  low-density lesion in the inferior spleen. Adrenals/Urinary Tract: The adrenal glands are unremarkable. Unchanged severe left renal atrophy with multiple left-sided cysts. Unchanged moderate left hydroureteronephrosis to the level of the distal ureter. The left distal ureter remains poorly visualized and possibly  atretic. Unchanged high density material within the left renal collecting system. Unchanged right renal cyst. No right-sided hydronephrosis. The bladder is unremarkable. Stomach/Bowel: Unchanged small to moderate hiatal hernia with mild distension of the lower esophagus and proximal stomach. No bowel obstruction. No bowel wall thickening or surrounding inflammatory changes. There are a few sigmoid colonic diverticula. The appendix is surgically absent. Vascular/Lymphatic: Aortic atherosclerosis. 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 No enlarged abdominal or pelvic lymph nodes. Reproductive: Status post hysterectomy. No adnexal masses. Other: No abdominal wall hernia or abnormality. No abdominopelvic ascites. No pneumoperitoneum. Musculoskeletal: No acute or significant osseous findings. Unchanged chronic T12 and L1 compression deformities. IMPRESSION: 1. Unchanged small to moderate hiatal hernia with new mild distension of the distal esophagus and proximal stomach. No bowel obstruction. 2. 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 3. Severe aortic atherosclerosis (ICD10-I70.0). Unchanged prominent celiac and SMA origin stenoses. Electronically Signed   By: Titus Dubin M.D.   On: 03/22/2018 12:14   Ct Head Wo Contrast  Result Date: 03/27/2018 CLINICAL DATA:  Recurrent syncope EXAM: CT HEAD WITHOUT CONTRAST TECHNIQUE: Contiguous axial images were obtained from the base of the skull through the vertex without intravenous contrast. COMPARISON:  02/21/2018 FINDINGS: Brain: Remote right MCA territory infarct affecting the posterior insula, lateral frontal lobe, and anterior parietal lobe. No acute or interval infarct. No hemorrhage, hydrocephalus, or masslike finding. Cerebral volume loss without specific pattern Vascular: No hyperdense vessel or unexpected calcification. Skull: Negative  Sinuses/Orbits: Bilateral cataract resection IMPRESSION: 1. No acute finding. 2. Large remote right MCA branch infarct. Electronically Signed   By: Monte Fantasia M.D.   On: 03/27/2018 15:24   Ct Angio Neck W Or Wo Contrast  Result Date: 03/28/2018 CLINICAL DATA:  Recurrent syncope EXAM: CT ANGIOGRAPHY NECK TECHNIQUE: Multidetector CT imaging of the neck was performed using the standard protocol during bolus administration of intravenous contrast. Multiplanar CT image reconstructions and MIPs were obtained to evaluate the vascular anatomy. Carotid stenosis measurements (when applicable) are obtained utilizing NASCET criteria, using the distal internal carotid diameter as the denominator. CONTRAST:  57mL OMNIPAQUE IOHEXOL 350 MG/ML SOLN COMPARISON:  10/13/2017 FINDINGS: Aortic arch: Severe atherosclerosis with irregular plaque diffusely. Aneurysmal widening of the transverse arch 3.7 cm, non progressed. 2 vessel branching Right carotid system: Diffuse atheromatous plaque on the brachiocephalic, common carotid, and at the bifurcation. No ulceration or flow limiting stenosis. Left carotid system: Diffuse atheromatous wall thickening of the common carotid. Irregular mixed density plaque at the ICA bulb with 70% stenosis based on axial slice measurement. ICA looping. Vertebral arteries: Extensive subclavian atherosclerotic plaque. Calcified plaque at the proximal left subclavian causes 65% stenosis. There is severe atheromatous narrowing of both V1 segments. Otherwise the vertebral arteries are widely patent to the basilar. Skeleton: Cervical spine degeneration. Healed right distal clavicle fracture. Other neck: Interval increased density and volume loss of the right parotid. No surrounding inflammation or discrete mass is seen. Upper chest: No acute finding.  Mild emphysema. IMPRESSION: 1. Severe atherosclerosis. 2. In this patient with history of syncope there is bilateral advanced vertebral origin stenosis and  ~65% narrowing of the proximal left subclavian. 3. Bilateral cervical carotid  atherosclerosis with up to 70% stenosis at the left ICA bulb. 4. 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 Electronically Signed   By: Monte Fantasia M.D.   On: 03/28/2018 10:28   US Carotid Bilateral  Result Date: 03/28/2018 CLINICAL DATA:  85 year old female history of syncope. EXAM: BILATERAL CAROTID DUPLEX ULTRASOUND TECHNIQUE: Pearline Cables scale imaging, color Doppler and duplex ultrasound were performed of bilateral carotid and vertebral arteries in the neck. COMPARISON:  No prior duplex FINDINGS: Criteria: Quantification of carotid stenosis is based on velocity parameters that correlate the residual internal carotid diameter with NASCET-based stenosis levels, using the diameter of the distal internal carotid lumen as the denominator for stenosis measurement. The following velocity measurements were obtained: RIGHT ICA:  Systolic 883 cm/sec, Diastolic 12 cm/sec CCA:  64 cm/sec SYSTOLIC ICA/CCA RATIO:  2.0 ECA:  61 cm/sec LEFT ICA:  Systolic 254 cm/sec, Diastolic 30 cm/sec CCA:  47 cm/sec SYSTOLIC ICA/CCA RATIO:  4.3 ECA:  60 cm/sec Right Brachial SBP: Not acquired Left Brachial SBP: Not acquired RIGHT CAROTID ARTERY: Calcifications of the right common carotid artery. Intermediate waveform maintained. Moderate heterogeneous and partially calcified plaque at the right carotid bifurcation. Significant shadowing low resistance waveform of the right ICA. Tortuosity RIGHT VERTEBRAL ARTERY: Antegrade flow with low resistance waveform. LEFT CAROTID ARTERY: Calcifications of the left common carotid artery. Intermediate waveform maintained. Moderate heterogeneous and partially calcified plaque at the left carotid bifurcation. Significant shadowing low resistance waveform of the left ICA. Tortuosity LEFT VERTEBRAL ARTERY:  Antegrade flow with postobstructive waveform IMPRESSION:  Right: Heterogeneous and partially calcified plaque at the right carotid bifurcation contributes to 50%-69% stenosis by established duplex criteria. Left: Heterogeneous plaque at the left carotid bifurcation, with discordant results regarding degree of stenosis by established duplex criteria. Peak velocity suggests 50%-69% stenosis, with the ICA/ CCA ratio suggesting a higher degree stenosis. If establishing a more accurate degree of stenosis is required, cerebral angiogram should be considered, or as a second best test, CTA. Parvus tardus waveform of the left vertebral artery suggests more proximal stenosis. Signed, Dulcy Fanny. Dellia Nims, RPVI [facility] Radiology Electronically Signed   By: Corrie Mckusick D.O.   On: 03/28/2018 08:18   Dg Chest Portable 1 View  Result Date: 03/26/2018 CLINICAL DATA:  Altered mental status. Nausea and left arm pain. History of stroke. EXAM: PORTABLE CHEST 1 VIEW COMPARISON:  03/22/2018 and 02/21/2018. FINDINGS: 0948 hours. The heart size and mediastinal contours are stable. There is extensive aortic atherosclerosis and mild tortuosity. The lungs are clear. There is no pleural effusion or pneumothorax. Old fracture of the distal right clavicle noted. No acute osseous findings are seen. IMPRESSION: Stable chest.  No active cardiopulmonary process. Electronically Signed   By: Richardean Sale M.D.   On: 03/26/2018 10:05   Dg Chest Portable 1 View  Result Date: 03/22/2018 CLINICAL DATA:  Abdominal pain and nausea. EXAM: PORTABLE CHEST 1 VIEW COMPARISON:  02/21/2018 and 10/30/2017 FINDINGS: The heart size and pulmonary vascularity are normal and the lungs are clear. Tortuosity of the thoracic aorta with extensive aortic calcification. No significant bone abnormality. Old healed fracture of the distal right clavicle. IMPRESSION: No acute abnormality. Aortic Atherosclerosis (ICD10-I70.0). Electronically Signed   By: Lorriane Shire M.D.    On: 03/22/2018 11:09    Wt Readings from Last 3 Encounters:  03/26/18 50 kg  03/22/18 49.9 kg  02/23/18 45.4 kg    EKG:  Sinus rhythm with nonspecific ST-T wave changes.  Physical Exam:  Blood pressure (!) 142/59, pulse 76, temperature 98 F (36.7 C), temperature source Oral, resp. rate 17, height 5' (1.524 m), weight 50 kg, SpO2 100 %. Body mass index is 21.53 kg/m. General: Well developed, well nourished, in no acute distress. Head: Normocephalic, atraumatic, sclera non-icteric, no xanthomas, nares are without discharge.  Neck: Bilateral carotid bruits.. JVD not elevated. Lungs: Clear bilaterally to auscultation without wheezes, rales, or rhonchi. Breathing is unlabored. Heart: RRR with S1 S2. No murmurs, rubs, or gallops appreciated. Abdomen: Soft, non-tender, non-distended with normoactive bowel sounds. No hepatomegaly. No rebound/guarding. No obvious abdominal masses. Msk:  Strength and tone appear normal for age. Extremities: No clubbing or cyanosis. No edema.  Distal pedal pulses are 2+ and equal bilaterally. Neuro: Alert and oriented X 3.  Left body weakness. Psych:  Responds to questions appropriately with a normal affect.     Assessment and Plan  Patient with history recurrent syncope.  These episodes seem to occur in the setting of vasovagal type events occurring while having a bowel movement frequently.  She does have a history of left body CVA with significant carotid disease and is being seen by vascular surgery.  She also has significant hypokalemia on occasion.  Nephrology is seen today and felt patient may be nutritionally deficient.  Would continue with current regimen although consider holding the Eliquis for possible vascular intervention on her carotids.  Will review her echo today when available.  Will reduce her amlodipine from 10 to 5 mg daily to allow somewhat greater buffer in her blood pressure should she become transiently hypotensive during straining causing  cerebral hypoperfusion given her restricted carotid flow.  Will follow for arrhythmias.  Signed, Teodoro Spray MD 03/28/2018, 3:34 PM Pager: [phone]

## 2018-03-28 NOTE — Evaluation (Signed)
Physical Therapy Evaluation Patient Details Name: Sonya [name] MRN: [id number] DOB: Dec 04, 1932 Today's Date: 03/28/2018   History of Present Illness  Patient is an 85 y/o female that presents with syncopal episode at home while on commode and was found by son. 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   Clinical Impression  Patient had a CVA last summer, which has led to a downward spiral in her mobility level per son. She has largely only been able to transfer recently due to ongoing L sided weakness. She has had several episodes of decreased consciousness while on the commode recently, was alert and oriented in this session but has lost considerable strength. She required significant assistance for bed mobility and sitting balance at the EOB. She was unable to stand adequately as her LEs began sliding off the EOB, and was returned to supine after several minutes. Discussed with son who states this is below her baseline and that she has done well before at skilled rehab. She would likely benefit from skilled rehab facility after discharge to improve her sitting balance and OOB mobility.     Follow Up Recommendations SNF    Equipment Recommendations  (TBD at next venue of care)    Recommendations for Other Services       Precautions / Restrictions Precautions Precautions: Fall Restrictions Other Position/Activity Restrictions: L sided weakness      Mobility  Bed Mobility Overal bed mobility: Needs Assistance Bed Mobility: Supine to Sit;Sit to Supine     Supine to sit: Mod assist Sit to supine: Mod assist   General bed mobility comments: Patient is able to complete minimal hip abduction to get hip to EOB, requires assistance for trunk management.  Transfers                 General transfer comment: Deferred  Ambulation/Gait                Stairs            Wheelchair Mobility     Modified Rankin (Stroke Patients Only)       Balance Overall balance assessment: Needs assistance Sitting-balance support: Single extremity supported Sitting balance-Leahy Scale: Fair       Standing balance-Leahy Scale: Zero                               Pertinent Vitals/Pain Pain Assessment: (Patient moaned at time with bed mobility, did not indicate any specific locations of pain)    Home Living Family/patient expects to be discharged to:: Private residence Living Arrangements: Spouse/significant other Available Help at Discharge: Family;Available 24 hours/day(Son can help as needed. ) Type of Home: House Home Access: Ramped entrance     Home Layout: One level Home Equipment: Wheelchair - manual;Bedside commode;Cane - single point;Hospital bed      Prior Function Level of Independence: Needs assistance   Gait / Transfers Assistance Needed: Son/spouse assist with bed mobility and transfers, pt non-ambulatory; recent fall with pt sliding out of recliner, no other recent falls  ADL's / Homemaking Assistance Needed: Son/spouse assist with all ADLs  Comments: At baseline, mod indep with assist device for ADLs, household mobility; since CVA (approx 2 weeks prior), requiring up to max assist +2 for all functional      Hand Dominance   Dominant Hand: Right    Extremity/Trunk Assessment  Upper Extremity Assessment Upper Extremity Assessment: LUE deficits/detail LUE Deficits / Details: Significant loss of strength, unable to determine specific myotomes as patient had difficulty following commands    Lower Extremity Assessment Lower Extremity Assessment: LLE deficits/detail LLE Deficits / Details: Significant loss of strength, unable to determine specific myotomes as patient had difficulty following commands       Communication   Communication: No difficulties  Cognition Arousal/Alertness: Awake/alert Behavior During Therapy: WFL for tasks  assessed/performed;Flat affect Overall Cognitive Status: History of cognitive impairments - at baseline                                        General Comments      Exercises Other Exercises Other Exercises: Assisted RN staff with rolling and cleaning up patient after bowel movement with mod A provided from therapist.    Assessment/Plan    PT Assessment Patient needs continued PT services  PT Problem List Decreased strength;Decreased balance;Decreased range of motion;Decreased activity tolerance;Decreased mobility;Decreased knowledge of use of DME;Decreased safety awareness;Decreased knowledge of precautions       PT Treatment Interventions DME instruction;Functional mobility training;Gait training;Therapeutic activities;Stair training;Therapeutic exercise;Neuromuscular re-education;Balance training    PT Goals (Current goals can be found in the Care Plan section)  Acute Rehab PT Goals Patient Stated Goal: To return home safely  PT Goal Formulation: With patient/family Time For Goal Achievement: 04/11/18 Potential to Achieve Goals: Fair    Frequency Min 2X/week   Barriers to discharge        Co-evaluation               AM-PAC PT "6 Clicks" Mobility  Outcome Measure Help needed turning from your back to your side while in a flat bed without using bedrails?: A Lot Help needed moving from lying on your back to sitting on the side of a flat bed without using bedrails?: A Lot Help needed moving to and from a bed to a chair (including a wheelchair)?: Total Help needed standing up from a chair using your arms (e.g., wheelchair or bedside chair)?: Total Help needed to walk in hospital room?: Total Help needed climbing 3-5 steps with a railing? : Total 6 Click Score: 8    End of Session Equipment Utilized During Treatment: Gait belt Activity Tolerance: Patient tolerated treatment well;Patient limited by fatigue Patient left: in bed;with call bell/phone  within reach;with bed alarm set;with family/visitor present Nurse Communication: Mobility status PT Visit Diagnosis: Muscle weakness (generalized) (M62.81);Difficulty in walking, not elsewhere classified (R26.2)    Time: 3557-3220 PT Time Calculation (min) (ACUTE ONLY): 23 min   Charges:   PT Evaluation $PT Eval Moderate Complexity: 1 Mod PT Treatments $Therapeutic Activity: 8-22 mins    Royce Macadamia PT, DPT, CSCS      03/28/2018, 3:54 PM

## 2018-03-28 NOTE — Progress Notes (Signed)
[facility] [place] NAME: Sonya [name]    MR#:  [id number]  DATE OF BIRTH:  09-Aug-1932  SUBJECTIVE:  CHIEF COMPLAINT: Patient is doing okay.  No other episodes of syncope Son at bedside  REVIEW OF SYSTEMS:  CONSTITUTIONAL: No fever, fatigue or weakness.  EYES: No blurred or double vision.  EARS, NOSE, AND THROAT: No tinnitus or ear pain.  RESPIRATORY: No cough, shortness of breath, wheezing or hemoptysis.  CARDIOVASCULAR: No chest pain, orthopnea, edema.  GASTROINTESTINAL: No nausea, vomiting, diarrhea or abdominal pain.  GENITOURINARY: No dysuria, hematuria.  ENDOCRINE: No polyuria, nocturia,  HEMATOLOGY: No anemia, easy bruising or bleeding SKIN: No rash or lesion. MUSCULOSKELETAL: No joint pain or arthritis.   NEUROLOGIC: No tingling, numbness, weakness.  PSYCHIATRY: No anxiety or depression.   DRUG ALLERGIES:   Allergies  Allergen Reactions  . Clopidogrel Nausea And Vomiting     reported by [facility] 11/24/13  . Omeprazole Other (See Comments)    Unknown reaction - reported by [facility] 11/24/13    VITALS:  Blood pressure (!) 142/59, pulse 76, temperature 98 F (36.7 C), temperature source Oral, resp. rate 17, height 5' (1.524 m), weight 50 kg, SpO2 100 %.  PHYSICAL EXAMINATION:  GENERAL:  85 y.o.-year-old patient lying in the bed with no acute distress.  EYES: Pupils equal, round, reactive to light and accommodation. No scleral icterus. Extraocular muscles intact.  HEENT: Head atraumatic, normocephalic. Oropharynx and nasopharynx clear.  NECK:  Supple, no jugular venous distention. No thyroid enlargement, no tenderness.  LUNGS: Normal breath sounds bilaterally, no wheezing, rales,rhonchi or crepitation. No use of accessory muscles of respiration.  CARDIOVASCULAR: S1, S2 normal. No murmurs, rubs, or gallops.  ABDOMEN: Soft, nontender, nondistended. Bowel sounds present.   EXTREMITIES: No pedal edema, cyanosis, or clubbing.  NEUROLOGIC: Awake alert and oriented x3 sensation intact. Gait not checked.  PSYCHIATRIC: The patient is alert and oriented x 3.  SKIN: No obvious rash, lesion, or ulcer.    LABORATORY PANEL:   CBC Recent Labs  Lab 03/28/18 0418  WBC 15.3*  HGB 8.5*  HCT 26.8*  PLT 373   ------------------------------------------------------------------------------------------------------------------  Chemistries  Recent Labs  Lab 03/26/18 0950  03/28/18 0418  NA 139   < > 137  K 2.6*   < > 3.2*  CL 100   < > 103  CO2 29   < > 27  GLUCOSE 129*   < > 95  BUN 15   < > 15  CREATININE 1.08*   < > 0.90  CALCIUM 9.3   < > 8.5*  MG 1.8  --  1.6*  AST 20  --   --   ALT 15  --   --   ALKPHOS 56  --   --   BILITOT 0.8  --   --    < > = values in this interval not displayed.   ------------------------------------------------------------------------------------------------------------------  Cardiac Enzymes Recent Labs  Lab 03/26/18 0950  TROPONINI <0.03   ------------------------------------------------------------------------------------------------------------------  RADIOLOGY:  Ct Head Wo Contrast  Result Date: 03/27/2018 CLINICAL DATA:  Recurrent syncope EXAM: CT HEAD WITHOUT CONTRAST TECHNIQUE: Contiguous axial images were obtained from the base of the skull through the vertex without intravenous contrast. COMPARISON:  02/21/2018 FINDINGS: Brain: Remote right MCA territory infarct affecting the posterior insula, lateral frontal lobe, and anterior parietal lobe. No acute or interval infarct. No hemorrhage, hydrocephalus, or masslike finding. Cerebral volume loss  without specific pattern Vascular: No hyperdense vessel or unexpected calcification. Skull: Negative Sinuses/Orbits: Bilateral cataract resection IMPRESSION: 1. No acute finding. 2. Large remote right MCA branch infarct. Electronically Signed   By: Monte Fantasia M.D.   On:  03/27/2018 15:24   Ct Angio Neck W Or Wo Contrast  Result Date: 03/28/2018 CLINICAL DATA:  Recurrent syncope EXAM: CT ANGIOGRAPHY NECK TECHNIQUE: Multidetector CT imaging of the neck was performed using the standard protocol during bolus administration of intravenous contrast. Multiplanar CT image reconstructions and MIPs were obtained to evaluate the vascular anatomy. Carotid stenosis measurements (when applicable) are obtained utilizing NASCET criteria, using the distal internal carotid diameter as the denominator. CONTRAST:  67mL OMNIPAQUE IOHEXOL 350 MG/ML SOLN COMPARISON:  10/13/2017 FINDINGS: Aortic arch: Severe atherosclerosis with irregular plaque diffusely. Aneurysmal widening of the transverse arch 3.7 cm, non progressed. 2 vessel branching Right carotid system: Diffuse atheromatous plaque on the brachiocephalic, common carotid, and at the bifurcation. No ulceration or flow limiting stenosis. Left carotid system: Diffuse atheromatous wall thickening of the common carotid. Irregular mixed density plaque at the ICA bulb with 70% stenosis based on axial slice measurement. ICA looping. Vertebral arteries: Extensive subclavian atherosclerotic plaque. Calcified plaque at the proximal left subclavian causes 65% stenosis. There is severe atheromatous narrowing of both V1 segments. Otherwise the vertebral arteries are widely patent to the basilar. Skeleton: Cervical spine degeneration. Healed right distal clavicle fracture. Other neck: Interval increased density and volume loss of the right parotid. No surrounding inflammation or discrete mass is seen. Upper chest: No acute finding.  Mild emphysema. IMPRESSION: 1. Severe atherosclerosis. 2. In this patient with history of syncope there is bilateral advanced vertebral origin stenosis and ~65% narrowing of the proximal left subclavian. 3. Bilateral cervical carotid atherosclerosis with up to 70% stenosis at the left ICA bulb. 4. 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 Electronically Signed   By: Monte Fantasia M.D.   On: 03/28/2018 10:28   US Carotid Bilateral  Result Date: 03/28/2018 CLINICAL DATA:  85 year old female history of syncope. EXAM: BILATERAL CAROTID DUPLEX ULTRASOUND TECHNIQUE: Pearline Cables scale imaging, color Doppler and duplex ultrasound were performed of bilateral carotid and vertebral arteries in the neck. COMPARISON:  No prior duplex FINDINGS: Criteria: Quantification of carotid stenosis is based on velocity parameters that correlate the residual internal carotid diameter with NASCET-based stenosis levels, using the diameter of the distal internal carotid lumen as the denominator for stenosis measurement. The following velocity measurements were obtained: RIGHT ICA:  Systolic 425 cm/sec, Diastolic 12 cm/sec CCA:  64 cm/sec SYSTOLIC ICA/CCA RATIO:  2.0 ECA:  61 cm/sec LEFT ICA:  Systolic 956 cm/sec, Diastolic 30 cm/sec CCA:  47 cm/sec SYSTOLIC ICA/CCA RATIO:  4.3 ECA:  60 cm/sec Right Brachial SBP: Not acquired Left Brachial SBP: Not acquired RIGHT CAROTID ARTERY: Calcifications of the right common carotid artery. Intermediate waveform maintained. Moderate heterogeneous and partially calcified plaque at the right carotid bifurcation. Significant shadowing low resistance waveform of the right ICA. Tortuosity RIGHT VERTEBRAL ARTERY: Antegrade flow with low resistance waveform. LEFT CAROTID ARTERY: Calcifications of the left common carotid artery. Intermediate waveform maintained. Moderate heterogeneous and partially calcified plaque at the left carotid bifurcation. Significant shadowing low resistance waveform of the left ICA. Tortuosity LEFT VERTEBRAL ARTERY:  Antegrade flow with postobstructive waveform IMPRESSION: Right: Heterogeneous and partially calcified plaque at the right carotid bifurcation contributes to 50%-69% stenosis by established duplex criteria. Left: Heterogeneous plaque at  the left carotid bifurcation, with discordant results regarding degree of stenosis by established duplex criteria. Peak velocity suggests 50%-69% stenosis, with the ICA/ CCA ratio suggesting a higher degree stenosis. If establishing a more accurate degree of stenosis is required, cerebral angiogram should be considered, or as a second best test, CTA. Parvus tardus waveform of the left vertebral artery suggests more proximal stenosis. Signed, Dulcy Fanny. Dellia Nims, RPVI [facility] Electronically Signed   By: Corrie Mckusick D.O.   On: 03/28/2018 08:18    EKG:   Orders placed or performed during the hospital encounter of 03/22/18  . ED EKG  . ED EKG    ASSESSMENT AND PLAN:   Syncope looks like vasovagal , high-grade stenosis of ICAs Telemetry monitoring CT head no acute findings Carotid Dopplers-revealed high-grade stenosis CT angiogram is done revealing 70% stenosis , vascular surgery consult placed as the patient is having recurrent syncopal episode  echocardiogram Cardiology consult to Dr. Ubaldo Glassing Neurochecks PT evaluation Check orthostatics Check CBC stat given the recent history of GI bleed   Hypokalemia and generalized weakness Likely malnutrition-dietitian consult Normal magnesium Patient had significant and recurrent hypokalemia during last admission also. Repleted orally and start her on potassium rich diet Seen by nephrology Dr. Zollie Scale, giving IV potassium for better absorption PCP to consider checking potassium level during the follow-up visit Goal is to keep potassium greater than 4 and magnesium greater than 2   *Altered mental status-resolved This happened at home, patient is currently alert and oriented. UA is negative, chest x-ray is clear, influenza test is negative. Son denies any fever.  *Recent GI bleed Continue PPI twice daily and continue anticoagulation as recommended by GI.  *Uncontrolled  hypertension Continue amlodipine and clonidine, added metoprolol.  *Tachycardia Resolved after adding metoprolol  * hypothyroidism Continue levothyroxine.   Normal TSH.  *History of stroke Patient is on Eliquis, continue the same.   All the records are reviewed and case discussed with Care Management/Social Workerr. Management plans discussed with the patient, family and they are in agreement.  CODE STATUS: DNR  TOTAL TIME TAKING CARE OF THIS PATIENT: 36  minutes.   POSSIBLE D/C IN 1-2 DAYS, DEPENDING ON CLINICAL CONDITION.  Note: This dictation was prepared with Dragon dictation along with smaller phrase technology. Any transcriptional errors that result from this process are unintentional.   Nicholes Mango M.D on 03/28/2018 at 12:49 PM  Between 7am to 6pm - Pager - [phone] After 6pm go to [email or web address] - password [email or web address] [facility]  Office  [phone]  CC: Primary care physician; Baxter Hire, MD

## 2018-03-28 NOTE — Consult Note (Signed)
[facility] VASCULAR & VEIN SPECIALISTS Vascular Consult Note  MRN : [id number]  Sonya [name] is a 85 y.o. (15-Jun-1932) female who presents with chief complaint of syncope.    Chief Complaint  Patient presents with  . Altered Mental Status  .  History of Present Illness: 85 yo female with history of R CVA Aug 2018 and subsequent left sided paresis, with repeat episodes of syncope since CVA.  Per patient she has had episodes of dizziness and passing out since her CVA.  She does not complain of new or worsening weakness, difficulty swallowing or speaking.  She states she is still in therapy for her left sided weakness.  Also states she is bed-bound.  Currently on ASA, Eliquis, and statin therapy.  Recent admission for UGI bleed s/p endoscopic treatment, discharged to home and resumed Eliquis, returned to ED due to syncope at home.    Current Facility-Administered Medications  Medication Dose Route Frequency Provider Last Rate Last Dose  . [START ON 03/29/2018] amLODipine (NORVASC) tablet 5 mg  5 mg Oral Daily Teodoro Spray, MD      . apixaban Arne Cleveland) tablet 2.5 mg  2.5 mg Oral BID Vaughan Basta, MD   2.5 mg at 03/28/18 1152  . aspirin EC tablet 81 mg  81 mg Oral Daily Vaughan Basta, MD   81 mg at 03/28/18 1152  . atorvastatin (LIPITOR) tablet 20 mg  20 mg Oral q1800 Hallaji, Dani Gobble, RPH   20 mg at 03/28/18 1152  . B-complex with vitamin C tablet 1 tablet  1 tablet Oral Daily Vaughan Basta, MD   1 tablet at 03/28/18 1152  . bisacodyl (DULCOLAX) EC tablet 10 mg  10 mg Oral Daily Vaughan Basta, MD   10 mg at 03/28/18 1153  . cholecalciferol (VITAMIN D) tablet 3,000 Units  3,000 Units Oral QHS Vaughan Basta, MD   3,000 Units at 03/27/18 2155  . cloNIDine (CATAPRES) tablet 0.1 mg  0.1 mg Oral BID Vaughan Basta, MD   0.1 mg at 03/28/18 1153  . docusate sodium (COLACE) capsule 100 mg  100 mg Oral BID PRN Vaughan Basta, MD      .  gabapentin (NEURONTIN) capsule 100 mg  100 mg Oral TID Vaughan Basta, MD   100 mg at 03/28/18 1152  . HYDROcodone-acetaminophen (NORCO/VICODIN) 5-325 MG per tablet 1 tablet  1 tablet Oral Q6H PRN Vaughan Basta, MD   1 tablet at 03/26/18 2233  . levothyroxine (SYNTHROID, LEVOTHROID) tablet 25 mcg  25 mcg Oral QAC breakfast Vaughan Basta, MD   25 mcg at 03/28/18 1152  . Melatonin TABS 2.5 mg  2.5 mg Oral QHS PRN Vaughan Basta, MD      . metoprolol tartrate (LOPRESSOR) tablet 25 mg  25 mg Oral BID Vaughan Basta, MD   25 mg at 03/28/18 1152  . ondansetron (ZOFRAN-ODT) disintegrating tablet 4 mg  4 mg Oral Q8H PRN Vaughan Basta, MD   4 mg at 03/27/18 2155  . pantoprazole (PROTONIX) EC tablet 20 mg  20 mg Oral Daily Vaughan Basta, MD   20 mg at 03/28/18 1152  . potassium chloride SA (K-DUR,KLOR-CON) CR tablet 40 mEq  40 mEq Oral BID Vaughan Basta, MD   40 mEq at 03/28/18 1152  . venlafaxine XR (EFFEXOR-XR) 24 hr capsule 150 mg  150 mg Oral QHS Vaughan Basta, MD   150 mg at 03/27/18 2155    Past Medical History:  Diagnosis Date  . CAD (coronary artery disease)   . Carpal  tunnel syndrome   . GERD (gastroesophageal reflux disease)   . History of shingles   . Hormone receptor positive breast cancer ([place])   . Hyperlipemia   . Hypertension   . Stroke [facility])     Past Surgical History:  Procedure Laterality Date  . ABDOMINAL HYSTERECTOMY    . APPENDECTOMY    . cornoary angioplasty    . ENDOSCOPIC RETROGRADE CHOLANGIOPANCREATOGRAPHY (ERCP) WITH PROPOFOL N/A 11/10/2017   Procedure: ENDOSCOPIC RETROGRADE CHOLANGIOPANCREATOGRAPHY (ERCP) WITH PROPOFOL;  Surgeon: Lucilla Lame, MD;  Location: [facility] ENDOSCOPY;  Service: Endoscopy;  Laterality: N/A;  . ESOPHAGOGASTRODUODENOSCOPY N/A 03/23/2018   Procedure: ESOPHAGOGASTRODUODENOSCOPY (EGD);  Surgeon: Lin Landsman, MD;  Location: [facility] ENDOSCOPY;  Service: Gastroenterology;   Laterality: N/A;  . ESOPHAGOGASTRODUODENOSCOPY (EGD) WITH PROPOFOL N/A 11/06/2017   Procedure: ESOPHAGOGASTRODUODENOSCOPY (EGD) WITH PROPOFOL;  Surgeon: Lucilla Lame, MD;  Location: [facility] ENDOSCOPY;  Service: Endoscopy;  Laterality: N/A;    Social History Social History   Tobacco Use  . Smoking status: Former Smoker    Packs/day: 0.50    Years: 20.00    Pack years: 10.00    Types: Cigarettes    Last attempt to quit: 02/17/1998    Years since quitting: 20.1  . Smokeless tobacco: Never Used  Substance Use Topics  . Alcohol use: Not Currently  . Drug use: Never    Family History Family History  Problem Relation Age of Onset  . Hypertension Son   . Ovarian cancer Mother   . Stroke Father   . Stroke Son     Allergies  Allergen Reactions  . Clopidogrel Nausea And Vomiting     reported by [place] 11/24/13  . Omeprazole Other (See Comments)    Unknown reaction - reported by [place] 11/24/13     REVIEW OF SYSTEMS (Negative unless checked)  Constitutional: [] Weight loss  [] Fever  [] Chills Cardiac: [] Chest pain   [] Chest pressure   [] Palpitations   [] Shortness of breath when laying flat   [] Shortness of breath at rest   [] Shortness of breath with exertion. Vascular:  [] Pain in legs with walking   [] Pain in legs at rest   [] Pain in legs when laying flat   [] Claudication   [] Pain in feet when walking  [] Pain in feet at rest  [] Pain in feet when laying flat   [] History of DVT   [] Phlebitis   [] Swelling in legs   [] Varicose veins   [] Non-healing ulcers Pulmonary:   [] Uses home oxygen   [] Productive cough   [] Hemoptysis   [] Wheeze  [] COPD   [] Asthma Neurologic:  [] Dizziness  [] Blackouts   [] Seizures   [x] History of stroke   [] History of TIA  [] Aphasia   [] Temporary blindness   [] Dysphagia   [x] Weakness or numbness in arms   [x] Weakness or numbness in legs Musculoskeletal:  [] Arthritis   [] Joint swelling   [] Joint pain   [] Low back  pain Hematologic:  [] Easy bruising  [] Easy bleeding   [] Hypercoagulable state   [] Anemic  [] Hepatitis Gastrointestinal:  [] Blood in stool   [] Vomiting blood  [] Gastroesophageal reflux/heartburn   [] Difficulty swallowing. Genitourinary:  [] Chronic kidney disease   [] Difficult urination  [] Frequent urination  [] Burning with urination   [] Blood in urine Skin:  [] Rashes   [] Ulcers   [] Wounds Psychological:  [] History of anxiety   []  History of major depression.  Physical Examination  Vitals:   03/26/18 1958 03/27/18 1442 03/27/18 2004 03/28/18 0551  BP: (!) 143/56 (!) 133/58 (!) 141/57 Marland Kitchen)  142/59  Pulse: 83 61 85 76  Resp: 16 20 19 17   Temp: 97.9 F (36.6 C)  (!) 97.3 F (36.3 C) 98 F (36.7 C)  TempSrc: Oral  Oral Oral  SpO2: 99% 100% 99% 100%  Weight:      Height:       Body mass index is 21.53 kg/m. Gen:  WD/WN, NAD Head: [place]/AT, No temporalis wasting. Prominent temp pulse not noted. Ear/Nose/Throat: Hearing grossly intact, nares w/o erythema or drainage, oropharynx w/o Erythema/Exudate Eyes: Sclera non-icteric, conjunctiva clear Neck: Trachea midline.  No JVD.  Pulmonary:  Good air movement, respirations not labored, equal bilaterally.  Cardiac: RRR, normal S1, S2. Vascular: Extremities without ischemic changes.   Gastrointestinal: soft, non-tender/non-distended. No guarding/reflex.  Musculoskeletal: No deformity or atrophy. No edema. Neurologic: Asymmetrical motor/sensation due to prior stroke (left sided weakness) Psychiatric: Judgment intact, Mood & affect appropriate for pt's clinical situation. Dermatologic: No rashes or ulcers noted.  No cellulitis or open wounds. Lymph : No Cervical, Axillary, or Inguinal lymphadenopathy.   CBC Lab Results  Component Value Date   WBC 15.3 (H) 03/28/2018   HGB 8.5 (L) 03/28/2018   HCT 26.8 (L) 03/28/2018   MCV 83.5 03/28/2018   PLT 373 03/28/2018    BMET    Component Value Date/Time   NA 137 03/28/2018 0418   K 3.2 (L)  03/28/2018 0418   CL 103 03/28/2018 0418   CO2 27 03/28/2018 0418   GLUCOSE 95 03/28/2018 0418   BUN 15 03/28/2018 0418   CREATININE 0.90 03/28/2018 0418   CALCIUM 8.5 (L) 03/28/2018 0418   GFRNONAA 59 (L) 03/28/2018 0418   GFRAA >60 03/28/2018 0418   Estimated Creatinine Clearance: 34 mL/min (by C-G formula based on SCr of 0.9 mg/dL).  COAG Lab Results  Component Value Date   INR 1.57 03/22/2018   INR 1.06 10/30/2017   INR 1.03 10/12/2017    Radiology Ct Abdomen Pelvis Wo Contrast  Result Date: 03/22/2018 CLINICAL DATA:  Nausea and vomiting.  L3 ground emesis. EXAM: CT ABDOMEN AND PELVIS WITHOUT CONTRAST TECHNIQUE: Multidetector CT imaging of the abdomen and pelvis was performed following the standard protocol without IV contrast. COMPARISON:  CT abdomen pelvis dated October 30, 2017. FINDINGS: Lower chest: No acute abnormality. Unchanged ectasia of the descending thoracic aorta measuring up to 3.3 cm. Hepatobiliary: No focal liver abnormality is seen. No gallstones, gallbladder wall thickening, or biliary dilatation. Pancreas: Unremarkable. No pancreatic ductal dilatation or surrounding inflammatory changes. Spleen: Normal in size. Unchanged 1.9 cm low-density lesion in the inferior spleen. Adrenals/Urinary Tract: The adrenal glands are unremarkable. Unchanged severe left renal atrophy with multiple left-sided cysts. Unchanged moderate left hydroureteronephrosis to the level of the distal ureter. The left distal ureter remains poorly visualized and possibly atretic. Unchanged high density material within the left renal collecting system. Unchanged right renal cyst. No right-sided hydronephrosis. The bladder is unremarkable. Stomach/Bowel: Unchanged small to moderate hiatal hernia with mild distension of the lower esophagus and proximal stomach. No bowel obstruction. No bowel wall thickening or surrounding inflammatory changes. There are a few sigmoid colonic diverticula. The appendix is  surgically absent. Vascular/Lymphatic: Aortic atherosclerosis. 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 No enlarged abdominal or pelvic lymph nodes. Reproductive: Status post hysterectomy. No adnexal masses. Other: No abdominal wall hernia or abnormality. No abdominopelvic ascites. No pneumoperitoneum. Musculoskeletal: No acute or significant osseous findings. Unchanged chronic T12 and L1 compression deformities. IMPRESSION: 1. Unchanged small to moderate hiatal hernia with new mild  distension of the distal esophagus and proximal stomach. No bowel obstruction. 2. 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 3. Severe aortic atherosclerosis (ICD10-I70.0). Unchanged prominent celiac and SMA origin stenoses. Electronically Signed   By: Titus Dubin M.D.   On: 03/22/2018 12:14   Ct Head Wo Contrast  Result Date: 03/27/2018 CLINICAL DATA:  Recurrent syncope EXAM: CT HEAD WITHOUT CONTRAST TECHNIQUE: Contiguous axial images were obtained from the base of the skull through the vertex without intravenous contrast. COMPARISON:  02/21/2018 FINDINGS: Brain: Remote right MCA territory infarct affecting the posterior insula, lateral frontal lobe, and anterior parietal lobe. No acute or interval infarct. No hemorrhage, hydrocephalus, or masslike finding. Cerebral volume loss without specific pattern Vascular: No hyperdense vessel or unexpected calcification. Skull: Negative Sinuses/Orbits: Bilateral cataract resection IMPRESSION: 1. No acute finding. 2. Large remote right MCA branch infarct. Electronically Signed   By: Monte Fantasia M.D.   On: 03/27/2018 15:24   Ct Angio Neck W Or Wo Contrast  Result Date: 03/28/2018 CLINICAL DATA:  Recurrent syncope EXAM: CT ANGIOGRAPHY NECK TECHNIQUE: Multidetector CT imaging of the neck was performed using the standard protocol during bolus administration of intravenous  contrast. Multiplanar CT image reconstructions and MIPs were obtained to evaluate the vascular anatomy. Carotid stenosis measurements (when applicable) are obtained utilizing NASCET criteria, using the distal internal carotid diameter as the denominator. CONTRAST:  56mL OMNIPAQUE IOHEXOL 350 MG/ML SOLN COMPARISON:  10/13/2017 FINDINGS: Aortic arch: Severe atherosclerosis with irregular plaque diffusely. Aneurysmal widening of the transverse arch 3.7 cm, non progressed. 2 vessel branching Right carotid system: Diffuse atheromatous plaque on the brachiocephalic, common carotid, and at the bifurcation. No ulceration or flow limiting stenosis. Left carotid system: Diffuse atheromatous wall thickening of the common carotid. Irregular mixed density plaque at the ICA bulb with 70% stenosis based on axial slice measurement. ICA looping. Vertebral arteries: Extensive subclavian atherosclerotic plaque. Calcified plaque at the proximal left subclavian causes 65% stenosis. There is severe atheromatous narrowing of both V1 segments. Otherwise the vertebral arteries are widely patent to the basilar. Skeleton: Cervical spine degeneration. Healed right distal clavicle fracture. Other neck: Interval increased density and volume loss of the right parotid. No surrounding inflammation or discrete mass is seen. Upper chest: No acute finding.  Mild emphysema. IMPRESSION: 1. Severe atherosclerosis. 2. In this patient with history of syncope there is bilateral advanced vertebral origin stenosis and ~65% narrowing of the proximal left subclavian. 3. Bilateral cervical carotid atherosclerosis with up to 70% stenosis at the left ICA bulb. 4. 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 Electronically Signed   By: Monte Fantasia M.D.   On: 03/28/2018 10:28   US Carotid Bilateral  Result Date: 03/28/2018 CLINICAL DATA:  85 year old female history of syncope. EXAM: BILATERAL  CAROTID DUPLEX ULTRASOUND TECHNIQUE: Pearline Cables scale imaging, color Doppler and duplex ultrasound were performed of bilateral carotid and vertebral arteries in the neck. COMPARISON:  No prior duplex FINDINGS: Criteria: Quantification of carotid stenosis is based on velocity parameters that correlate the residual internal carotid diameter with NASCET-based stenosis levels, using the diameter of the distal internal carotid lumen as the denominator for stenosis measurement. The following velocity measurements were obtained: RIGHT ICA:  Systolic 381 cm/sec, Diastolic 12 cm/sec CCA:  64 cm/sec SYSTOLIC ICA/CCA RATIO:  2.0 ECA:  61 cm/sec LEFT ICA:  Systolic 017 cm/sec, Diastolic 30 cm/sec CCA:  47  cm/sec SYSTOLIC ICA/CCA RATIO:  4.3 ECA:  60 cm/sec Right Brachial SBP: Not acquired Left Brachial SBP: Not acquired RIGHT CAROTID ARTERY: Calcifications of the right common carotid artery. Intermediate waveform maintained. Moderate heterogeneous and partially calcified plaque at the right carotid bifurcation. Significant shadowing low resistance waveform of the right ICA. Tortuosity RIGHT VERTEBRAL ARTERY: Antegrade flow with low resistance waveform. LEFT CAROTID ARTERY: Calcifications of the left common carotid artery. Intermediate waveform maintained. Moderate heterogeneous and partially calcified plaque at the left carotid bifurcation. Significant shadowing low resistance waveform of the left ICA. Tortuosity LEFT VERTEBRAL ARTERY:  Antegrade flow with postobstructive waveform IMPRESSION: Right: Heterogeneous and partially calcified plaque at the right carotid bifurcation contributes to 50%-69% stenosis by established duplex criteria. Left: Heterogeneous plaque at the left carotid bifurcation, with discordant results regarding degree of stenosis by established duplex criteria. Peak velocity suggests 50%-69% stenosis, with the ICA/ CCA ratio suggesting a higher degree stenosis. If establishing a more accurate degree of stenosis is  required, cerebral angiogram should be considered, or as a second best test, CTA. Parvus tardus waveform of the left vertebral artery suggests more proximal stenosis. Signed, Dulcy Fanny. Dellia Nims, RPVI [facility] Radiology Electronically Signed   By: Corrie Mckusick D.O.   On: 03/28/2018 08:18   Dg Chest Portable 1 View  Result Date: 03/26/2018 CLINICAL DATA:  Altered mental status. Nausea and left arm pain. History of stroke. EXAM: PORTABLE CHEST 1 VIEW COMPARISON:  03/22/2018 and 02/21/2018. FINDINGS: 0948 hours. The heart size and mediastinal contours are stable. There is extensive aortic atherosclerosis and mild tortuosity. The lungs are clear. There is no pleural effusion or pneumothorax. Old fracture of the distal right clavicle noted. No acute osseous findings are seen. IMPRESSION: Stable chest.  No active cardiopulmonary process. Electronically Signed   By: Richardean Sale M.D.   On: 03/26/2018 10:05   Dg Chest Portable 1 View  Result Date: 03/22/2018 CLINICAL DATA:  Abdominal pain and nausea. EXAM: PORTABLE CHEST 1 VIEW COMPARISON:  02/21/2018 and 10/30/2017 FINDINGS: The heart size and pulmonary vascularity are normal and the lungs are clear. Tortuosity of the thoracic aorta with extensive aortic calcification. No significant bone abnormality. Old healed fracture of the distal right clavicle. IMPRESSION: No acute abnormality. Aortic Atherosclerosis (ICD10-I70.0). Electronically Signed   By: Lorriane Shire M.D.   On: 03/22/2018 11:09      Assessment/Plan 85 yo female with history of CVA and recurrent episodes of syncope.  CTA with bilateral vertebral artery stenosis at the origin, severe LICA stenosis with eggshell calcification, and left SCA stenosis.   Syncope may be due to global hypoperfusion given flow limiting stenoses at bilateral vertebral, left ICA, and left SCA.  Unlikely due to ICA disease alone.  May be a candidate for cerebral angiogram  with possible interventions versus endarterectomy.  However also octogenarian with multiple medical problems with increased surgical risk.    1.  Follow up ECHO results.   2.  Hold Eliquis for possible upcoming procedure.   Murray Hodgkins, MD  03/28/2018 2:47 PM

## 2018-03-29 LAB — BASIC METABOLIC PANEL
Anion gap: 4 — ABNORMAL LOW (ref 5–15)
BUN: 15 mg/dL (ref 8–23)
CO2: 27 mmol/L (ref 22–32)
Calcium: 8.5 mg/dL — ABNORMAL LOW (ref 8.9–10.3)
Chloride: 103 mmol/L (ref 98–111)
Creatinine, Ser: 0.84 mg/dL (ref 0.44–1.00)
GFR calc Af Amer: >60 mL/min (ref >60)
GFR calc non Af Amer: >60 mL/min (ref >60)
Glucose, Bld: 98 mg/dL (ref 70–99)
Potassium: 5 mmol/L (ref 3.5–5.1)
SODIUM: 134 mmol/L — AB (ref 135–145)

## 2018-03-29 LAB — ECHOCARDIOGRAM COMPLETE
HEIGHTINCHES: 60 in
Weight: 1763.68 oz

## 2018-03-29 LAB — MAGNESIUM: MAGNESIUM: 2.1 mg/dL (ref 1.7–2.4)

## 2018-03-29 MED ORDER — ENSURE ENLIVE PO LIQD
237.0000 mL | Freq: Two times a day (BID) | ORAL | Status: DC
  Administered 2018-03-30: 11:00:00 237 mL via ORAL

## 2018-03-29 MED ORDER — LISINOPRIL 5 MG PO TABS
5.0000 mg | ORAL_TABLET | Freq: Every day | ORAL | Status: DC
  Administered 2018-03-29 – 2018-04-01 (×3): 5 mg via ORAL
  Filled 2018-03-29 (×3): qty 1

## 2018-03-29 MED ORDER — PRO-STAT SUGAR FREE PO LIQD
30.0000 mL | Freq: Two times a day (BID) | ORAL | Status: DC
  Administered 2018-03-30 – 2018-03-31 (×2): 30 mL via ORAL

## 2018-03-29 MED ORDER — CHLORHEXIDINE GLUCONATE CLOTH 2 % EX PADS
6.0000 | MEDICATED_PAD | Freq: Every day | CUTANEOUS | Status: DC
  Administered 2018-03-31 – 2018-04-01 (×2): 6 via TOPICAL

## 2018-03-29 MED ORDER — MUPIROCIN 2 % EX OINT
1.0000 "application " | TOPICAL_OINTMENT | Freq: Two times a day (BID) | CUTANEOUS | Status: DC
  Administered 2018-03-29 – 2018-04-01 (×7): 1 via NASAL
  Filled 2018-03-29: qty 22

## 2018-03-29 NOTE — Progress Notes (Signed)
Initial Nutrition Assessment  DOCUMENTATION CODES:   Severe malnutrition in context of chronic illness  INTERVENTION:  High Potassium Foods List  Ensure Enlive po BID, each supplement provides 350 kcal and 20 grams of protein (vanilla) Magic cup TID with meals, each supplement provides 290 kcal and 9 grams of protein (orange cream) Pro stat BID, each supplement provides 100 kcal and 15 grams of protein  MVI   NUTRITION DIAGNOSIS:   Severe Malnutrition related to chronic illness(left sided weakness s/p CVA, CAD) as evidenced by severe muscle depletion, moderate fat depletion, percent weight loss.   GOAL:   Patient will meet greater than or equal to 90% of their needs   MONITOR:   PO intake, Supplement acceptance, Labs  REASON FOR ASSESSMENT:   Consult Assessment of nutrition requirement/status, Diet education  ASSESSMENT:  85 year old female with known history of CAD, GERD, HLD, HTN,  CVA, bedbound, left-sided weakness, recent GI bleed s/p EGD admitted with hypokalemia.    Patient reports feeling nauseated and not well this afternoon at visit. Patient holding rag to her left cheek and responded to RD in a soft whisper making some responses difficult to understand. Patient reports living at home with her husband who is unable to help care for her due to his current medical conditions. She reports limited mobility s/p her stroke in May. Her son checks on them throughout the day and prepares meals for them.   Patient reports 2 meals/day and recalls peas, pintos, creamed pots, salmon, and cube steak. Patient reports eating 50% of lunch stating that feelings of nausea and the different medications she is currently taking has caused decreased appetite.   Patient reports UBW 125 lbs and endorses recent weight losses after her stroke (May 2019)  Spoke with patient about adding ONS at home to provide extra calories and protein in between meals. Patient agreeable and stated that her  husband drinks Ensure BID. Provided pt with handout on high potassium foods and encouraged patient to share with her son to help guide him when purchasing food and preparing meals.   Medications reviewed and include: B-complex w/ vit C Vit D, hydrocodone  Labs: Na 134 (L) replacing  NUTRITION - FOCUSED PHYSICAL EXAM:    Most Recent Value  Orbital Region  Moderate depletion  Upper Arm Region  Moderate depletion  Thoracic and Lumbar Region  Mild depletion  Buccal Region  Severe depletion  Temple Region  Moderate depletion  Clavicle Bone Region  Moderate depletion  Clavicle and Acromion Bone Region  Moderate depletion  Scapular Bone Region  Moderate depletion  Dorsal Hand  Moderate depletion  Patellar Region  Severe depletion  Anterior Thigh Region  Severe depletion  Posterior Calf Region  Severe depletion  Edema (RD Assessment)  None  Hair  Reviewed  Eyes  Reviewed  Mouth  Reviewed  Skin  Reviewed  Nails  Reviewed       Diet Order:   Diet Order            Diet Heart Room service appropriate? Yes; Fluid consistency: Thin  Diet effective now              EDUCATION NEEDS:   Education needs have been addressed  Skin:  Skin Assessment: Reviewed RN Assessment(stage 1: rt heel; multiple small wounds to areas of buttocks, elbow, toe, ankle)  Last BM:  2/6: Type 6 (Small; Brown)  Height:   Ht Readings from Last 1 Encounters:  03/26/18 5' (1.524 m)  Weight:   Wt Readings from Last 1 Encounters:  03/26/18 50 kg    Ideal Body Weight:  45.5 kg  BMI:  Body mass index is 21.53 kg/m.  Estimated Nutritional Needs:   Kcal:  1060-1150  Protein:  55-60 grams  Fluid:  1.2L/day    Lajuan Lines, RD, LDN  After Hours/Weekend Pager: [phone]

## 2018-03-29 NOTE — Discharge Summary (Signed)
Sonya [name], is a 85 y.o. female  DOB 1932/09/15  MRN [id number].  Admission date:  03/22/2018  Admitting Physician  Fritzi Mandes, MD  Discharge Date:  03/24/2018   Primary MD  Baxter Hire, MD  Recommendations for primary care physician for things to follow:   Follow with PCP in 1 week   Admission Diagnosis  Dehydration [E86.0] Upper GI bleed [K92.2] Anticoagulated [Z79.01]   Discharge Diagnosis  Dehydration [E86.0] Upper GI bleed [K92.2] Anticoagulated [Z79.01]    Active Problems:   Upper GI bleed   Coffee ground emesis   AVM (arteriovenous malformation) of stomach, acquired with hemorrhage      Past Medical History:  Diagnosis Date  . CAD (coronary artery disease)   . Carpal tunnel syndrome   . GERD (gastroesophageal reflux disease)   . History of shingles   . Hormone receptor positive breast cancer ([place])   . Hyperlipemia   . Hypertension   . Stroke [facility])     Past Surgical History:  Procedure Laterality Date  . ABDOMINAL HYSTERECTOMY    . APPENDECTOMY    . cornoary angioplasty    . ENDOSCOPIC RETROGRADE CHOLANGIOPANCREATOGRAPHY (ERCP) WITH PROPOFOL N/A 11/10/2017   Procedure: ENDOSCOPIC RETROGRADE CHOLANGIOPANCREATOGRAPHY (ERCP) WITH PROPOFOL;  Surgeon: Lucilla Lame, MD;  Location: [facility] ENDOSCOPY;  Service: Endoscopy;  Laterality: N/A;  . ESOPHAGOGASTRODUODENOSCOPY N/A 03/23/2018   Procedure: ESOPHAGOGASTRODUODENOSCOPY (EGD);  Surgeon: Lin Landsman, MD;  Location: [facility] ENDOSCOPY;  Service: Gastroenterology;  Laterality: N/A;  . ESOPHAGOGASTRODUODENOSCOPY (EGD) WITH PROPOFOL N/A 11/06/2017   Procedure: ESOPHAGOGASTRODUODENOSCOPY (EGD) WITH PROPOFOL;  Surgeon: Lucilla Lame, MD;  Location: [facility] ENDOSCOPY;  Service: Endoscopy;  Laterality: N/A;       History of present illness and  Hospital  Course:     Kindly see H&P for history of present illness and admission details, please review complete Labs, Consult reports and Test reports for all details in brief  HPI  from the history and physical done on the day of admission 85 year old female patient with history of chronic anticoagulation on Eliquis admitted because of coffee-ground vomiting, admitted for the same.   Hospital Course  #1 upper GI bleed, patient had coffee-ground vomiting on Eliquis, admitted to medical floor-hemoglobin stable and hemodynamically stable.  Received Protonix infusion, kept n.p.o., received IV fluids.  Patient had EGD by gastroenterology, which showed normal duodenum, friable mucosa, erythematous mucosa in the stomach, patient had 2 bleeding angiectasis in the stomach which are cauterized, gastroenterology recommended to restart Eliquis at current dose,.  Patient remained hemodynamically stable, continue Protonix infusion, changed to p.o. Protonix.  Discharge home with Prevacid 30 mg p.o. daily. Chronic A. fib: Patient Eliquis dose has been decreased to 2.5 mg p.o. twice daily as per age. 3.  Chronic pains because of history of previous stroke, patient was taking ibuprofen on a daily basis for her pains, advised her to stay away from NSAID is because of GI bleed, patient can take Vicodin, Zofran for nausea.  Patient is hesitant to take any narcotics, but she said Vicodin she she can take.  Spoke with patient son. 4.  Depression: Continue Paxil. 5.  History of DVT, patient has history of superficial and deep DVT, on Eliquis, follows with Dr. Delana Meyer. 6.  UTI: Patient received Rocephin, urine cultures Showed less than 30,000 colonies of Klebsiella.  Discharged without any further antibiotics.  #7. unstageable pressure ulcer of the left third toe, seen by wound care nurse, present on admission, patient also has unstageable left gluteal  ulcer, seen by wound care nurse recommended cleaning the wound, apply Santyl to  wound bed.  Also recommended applying barrier cream to buttocks. Discharge Condition: Stable   Follow UP  Follow-up Information    Baxter Hire, MD. Go on 03/29/2018.   Specialty:  Internal Medicine Why:  @9am  Contact information: [place] [id number] [phone]             Discharge Instructions  and  Discharge Medications      Allergies as of 03/24/2018      Reactions   Clopidogrel Nausea And Vomiting    reported by [place] 11/24/13   Omeprazole Other (See Comments)   Unknown reaction - reported by [place] 11/24/13      Medication List    STOP taking these medications   oxyCODONE 5 MG immediate release tablet Commonly known as:  Oxy IR/ROXICODONE     TAKE these medications   amLODipine 10 MG tablet Commonly known as:  NORVASC Take 1 tablet (10 mg total) by mouth daily.   apixaban 2.5 MG Tabs tablet Commonly known as:  ELIQUIS Take 1 tablet (2.5 mg total) by mouth 2 (two) times daily. What changed:    medication strength  how much to take  additional instructions   aspirin 81 MG EC tablet Take 1 tablet (81 mg total) by mouth daily.   b complex vitamins tablet Take 1 tablet by mouth daily.   bisacodyl 5 MG EC tablet Commonly known as:  DUCODYL Take 2 tablets (10 mg total) by mouth daily.   cloNIDine 0.2 MG tablet Commonly known as:  CATAPRES Take 1 tablet (0.2 mg total) by mouth 2 (two) times daily. What changed:  how much to take   collagenase ointment Commonly known as:  SANTYL Apply topically daily.   gabapentin 100 MG capsule Commonly known as:  NEURONTIN Take 100 mg by mouth 3 (three) times daily.   HYDROcodone-acetaminophen 5-325 MG tablet Commonly known as:  NORCO/VICODIN Take 1 tablet by mouth every 6 (six) hours as needed for moderate pain or severe pain.   lansoprazole 30 MG capsule Commonly known as:  PREVACID Take 1 capsule (30 mg total) by mouth  daily. What changed:    medication strength  how much to take  when to take this   levothyroxine 25 MCG tablet Commonly known as:  SYNTHROID, LEVOTHROID Take 25 mcg by mouth daily before breakfast.   Melatonin 3 MG Tabs Take 3 mg by mouth at bedtime as needed (sleep).   ondansetron 4 MG disintegrating tablet Commonly known as:  ZOFRAN-ODT Take 1 tablet (4 mg total) by mouth every 8 (eight) hours as needed for nausea or vomiting.   polyethylene glycol packet Commonly known as:  MIRALAX / GLYCOLAX Take 17 g by mouth daily as needed for moderate constipation.   potassium chloride SA 20 MEQ tablet Commonly known as:  K-DUR,KLOR-CON Take 20 mEq by mouth daily.   simvastatin 40 MG tablet Commonly known as:  ZOCOR Take 1 tablet (40 mg total) by mouth daily at 6 PM.   venlafaxine XR 150 MG 24 hr capsule Commonly known as:  EFFEXOR-XR Take 150 mg by mouth at bedtime.   Vitamin D3 25 MCG (1000 UT) Caps Take 3,000 Units by mouth at bedtime.         Diet and Activity recommendation: See Discharge Instructions above   Consults obtained -gastroenterology   Major procedures and Radiology Reports - PLEASE  review detailed and final reports for all details, in brief -      Ct Abdomen Pelvis Wo Contrast  Result Date: 03/22/2018 CLINICAL DATA:  Nausea and vomiting.  L3 ground emesis. EXAM: CT ABDOMEN AND PELVIS WITHOUT CONTRAST TECHNIQUE: Multidetector CT imaging of the abdomen and pelvis was performed following the standard protocol without IV contrast. COMPARISON:  CT abdomen pelvis dated October 30, 2017. FINDINGS: Lower chest: No acute abnormality. Unchanged ectasia of the descending thoracic aorta measuring up to 3.3 cm. Hepatobiliary: No focal liver abnormality is seen. No gallstones, gallbladder wall thickening, or biliary dilatation. Pancreas: Unremarkable. No pancreatic ductal dilatation or surrounding inflammatory changes. Spleen: Normal in size. Unchanged 1.9 cm  low-density lesion in the inferior spleen. Adrenals/Urinary Tract: The adrenal glands are unremarkable. Unchanged severe left renal atrophy with multiple left-sided cysts. Unchanged moderate left hydroureteronephrosis to the level of the distal ureter. The left distal ureter remains poorly visualized and possibly atretic. Unchanged high density material within the left renal collecting system. Unchanged right renal cyst. No right-sided hydronephrosis. The bladder is unremarkable. Stomach/Bowel: Unchanged small to moderate hiatal hernia with mild distension of the lower esophagus and proximal stomach. No bowel obstruction. No bowel wall thickening or surrounding inflammatory changes. There are a few sigmoid colonic diverticula. The appendix is surgically absent. Vascular/Lymphatic: Aortic atherosclerosis. 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 No enlarged abdominal or pelvic lymph nodes. Reproductive: Status post hysterectomy. No adnexal masses. Other: No abdominal wall hernia or abnormality. No abdominopelvic ascites. No pneumoperitoneum. Musculoskeletal: No acute or significant osseous findings. Unchanged chronic T12 and L1 compression deformities. IMPRESSION: 1. Unchanged small to moderate hiatal hernia with new mild distension of the distal esophagus and proximal stomach. No bowel obstruction. 2. 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 3. Severe aortic atherosclerosis (ICD10-I70.0). Unchanged prominent celiac and SMA origin stenoses. Electronically Signed   By: Titus Dubin M.D.   On: 03/22/2018 12:14   Ct Head Wo Contrast  Result Date: 03/27/2018 CLINICAL DATA:  Recurrent syncope EXAM: CT HEAD WITHOUT CONTRAST TECHNIQUE: Contiguous axial images were obtained from the base of the skull through the vertex without intravenous contrast. COMPARISON:  02/21/2018 FINDINGS: Brain: Remote right  MCA territory infarct affecting the posterior insula, lateral frontal lobe, and anterior parietal lobe. No acute or interval infarct. No hemorrhage, hydrocephalus, or masslike finding. Cerebral volume loss without specific pattern Vascular: No hyperdense vessel or unexpected calcification. Skull: Negative Sinuses/Orbits: Bilateral cataract resection IMPRESSION: 1. No acute finding. 2. Large remote right MCA branch infarct. Electronically Signed   By: Monte Fantasia M.D.   On: 03/27/2018 15:24   Ct Angio Neck W Or Wo Contrast  Result Date: 03/28/2018 CLINICAL DATA:  Recurrent syncope EXAM: CT ANGIOGRAPHY NECK TECHNIQUE: Multidetector CT imaging of the neck was performed using the standard protocol during bolus administration of intravenous contrast. Multiplanar CT image reconstructions and MIPs were obtained to evaluate the vascular anatomy. Carotid stenosis measurements (when applicable) are obtained utilizing NASCET criteria, using the distal internal carotid diameter as the denominator. CONTRAST:  90mL OMNIPAQUE IOHEXOL 350 MG/ML SOLN COMPARISON:  10/13/2017 FINDINGS: Aortic arch: Severe atherosclerosis with irregular plaque diffusely. Aneurysmal widening of the transverse arch 3.7 cm, non progressed. 2 vessel branching Right carotid system: Diffuse atheromatous plaque on the brachiocephalic, common carotid, and at the bifurcation. No ulceration or flow limiting stenosis. Left carotid system: Diffuse atheromatous wall thickening of the common carotid. Irregular mixed density  plaque at the ICA bulb with 70% stenosis based on axial slice measurement. ICA looping. Vertebral arteries: Extensive subclavian atherosclerotic plaque. Calcified plaque at the proximal left subclavian causes 65% stenosis. There is severe atheromatous narrowing of both V1 segments. Otherwise the vertebral arteries are widely patent to the basilar. Skeleton: Cervical spine degeneration. Healed right distal clavicle fracture. Other neck:  Interval increased density and volume loss of the right parotid. No surrounding inflammation or discrete mass is seen. Upper chest: No acute finding.  Mild emphysema. IMPRESSION: 1. Severe atherosclerosis. 2. In this patient with history of syncope there is bilateral advanced vertebral origin stenosis and ~65% narrowing of the proximal left subclavian. 3. Bilateral cervical carotid atherosclerosis with up to 70% stenosis at the left ICA bulb. 4. 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 Electronically Signed   By: Monte Fantasia M.D.   On: 03/28/2018 10:28   US Carotid Bilateral  Result Date: 03/28/2018 CLINICAL DATA:  85 year old female history of syncope. EXAM: BILATERAL CAROTID DUPLEX ULTRASOUND TECHNIQUE: Pearline Cables scale imaging, color Doppler and duplex ultrasound were performed of bilateral carotid and vertebral arteries in the neck. COMPARISON:  No prior duplex FINDINGS: Criteria: Quantification of carotid stenosis is based on velocity parameters that correlate the residual internal carotid diameter with NASCET-based stenosis levels, using the diameter of the distal internal carotid lumen as the denominator for stenosis measurement. The following velocity measurements were obtained: RIGHT ICA:  Systolic 272 cm/sec, Diastolic 12 cm/sec CCA:  64 cm/sec SYSTOLIC ICA/CCA RATIO:  2.0 ECA:  61 cm/sec LEFT ICA:  Systolic 536 cm/sec, Diastolic 30 cm/sec CCA:  47 cm/sec SYSTOLIC ICA/CCA RATIO:  4.3 ECA:  60 cm/sec Right Brachial SBP: Not acquired Left Brachial SBP: Not acquired RIGHT CAROTID ARTERY: Calcifications of the right common carotid artery. Intermediate waveform maintained. Moderate heterogeneous and partially calcified plaque at the right carotid bifurcation. Significant shadowing low resistance waveform of the right ICA. Tortuosity RIGHT VERTEBRAL ARTERY: Antegrade flow with low resistance waveform. LEFT CAROTID ARTERY: Calcifications of  the left common carotid artery. Intermediate waveform maintained. Moderate heterogeneous and partially calcified plaque at the left carotid bifurcation. Significant shadowing low resistance waveform of the left ICA. Tortuosity LEFT VERTEBRAL ARTERY:  Antegrade flow with postobstructive waveform IMPRESSION: Right: Heterogeneous and partially calcified plaque at the right carotid bifurcation contributes to 50%-69% stenosis by established duplex criteria. Left: Heterogeneous plaque at the left carotid bifurcation, with discordant results regarding degree of stenosis by established duplex criteria. Peak velocity suggests 50%-69% stenosis, with the ICA/ CCA ratio suggesting a higher degree stenosis. If establishing a more accurate degree of stenosis is required, cerebral angiogram should be considered, or as a second best test, CTA. Parvus tardus waveform of the left vertebral artery suggests more proximal stenosis. Signed, Dulcy Fanny. Dellia Nims, RPVI [facility] Electronically Signed   By: Corrie Mckusick D.O.   On: 03/28/2018 08:18   Dg Chest Portable 1 View  Result Date: 03/26/2018 CLINICAL DATA:  Altered mental status. Nausea and left arm pain. History of stroke. EXAM: PORTABLE CHEST 1 VIEW COMPARISON:  03/22/2018 and 02/21/2018. FINDINGS: 0948 hours. The heart size and mediastinal contours are stable. There is extensive aortic atherosclerosis and mild tortuosity. The lungs are clear. There is no pleural effusion or pneumothorax. Old fracture of the distal right clavicle noted. No acute osseous findings are seen. IMPRESSION: Stable chest.  No active cardiopulmonary process. Electronically Signed   By:  Richardean Sale M.D.   On: 03/26/2018 10:05   Dg Chest Portable 1 View  Result Date: 03/22/2018 CLINICAL DATA:  Abdominal pain and nausea. EXAM: PORTABLE CHEST 1 VIEW COMPARISON:  02/21/2018 and 10/30/2017 FINDINGS: The heart size and pulmonary vascularity are  normal and the lungs are clear. Tortuosity of the thoracic aorta with extensive aortic calcification. No significant bone abnormality. Old healed fracture of the distal right clavicle. IMPRESSION: No acute abnormality. Aortic Atherosclerosis (ICD10-I70.0). Electronically Signed   By: Lorriane Shire M.D.   On: 03/22/2018 11:09    Micro Results    Recent Results (from the past 240 hour(s))  Urine culture     Status: Abnormal   Collection Time: 03/22/18  8:35 PM  Result Value Ref Range Status   Specimen Description   Final    URINE, RANDOM Performed at [facility], [address], [place], [place] [address]    Special Requests   Final    NONE Performed at [facility], [place], [place] [address]    Culture 30,000 COLONIES/mL KLEBSIELLA PNEUMONIAE (A)  Final   Report Status 03/25/2018 FINAL  Final   Organism ID, Bacteria KLEBSIELLA PNEUMONIAE (A)  Final      Susceptibility   Klebsiella pneumoniae - MIC*    AMPICILLIN >=32 RESISTANT Resistant     CEFAZOLIN <=4 SENSITIVE Sensitive     CEFTRIAXONE <=1 SENSITIVE Sensitive     CIPROFLOXACIN <=0.25 SENSITIVE Sensitive     GENTAMICIN <=1 SENSITIVE Sensitive     IMIPENEM <=0.25 SENSITIVE Sensitive     NITROFURANTOIN <=16 SENSITIVE Sensitive     TRIMETH/SULFA <=20 SENSITIVE Sensitive     AMPICILLIN/SULBACTAM 4 SENSITIVE Sensitive     PIP/TAZO <=4 SENSITIVE Sensitive     Extended ESBL NEGATIVE Sensitive     * 30,000 COLONIES/mL KLEBSIELLA PNEUMONIAE  MRSA PCR Screening     Status: Abnormal   Collection Time: 03/27/18  2:49 AM  Result Value Ref Range Status   MRSA by PCR POSITIVE (A) NEGATIVE Final    Comment:        The GeneXpert MRSA Assay (FDA approved for NASAL specimens only), is one component of a comprehensive MRSA colonization surveillance program. It is not intended to diagnose MRSA infection nor to guide or monitor treatment for MRSA infections. RESULT CALLED TO, READ BACK BY AND  VERIFIED WITH: TEMEKA COLES ON 03/27/2018 AT 0423 QSD Performed at [facility], [address], [place], [place] [address]     Spoke with patient son at bedside, reviewed discharge medications, discharged home in stable condition, resume home health services with PT, RN.,  Nurses aide.   Today   Subjective:   Essica Kiker today has no headache,no chest abdominal pain,no new weakness tingling or numbness, feels much better wants to go home today.  Objective:   Blood pressure (!) 169/64, pulse (!) 117, temperature 98.6 F (37 C), temperature source Oral, resp. rate 20, height 5' (1.524 m), weight 49.9 kg, SpO2 100 %.  No intake or output data in the 24 hours ending 03/29/18 1034  Exam Awake Alert, Oriented x 3, No new F.N deficits, Normal affect [place].AT,PERRAL Supple Neck,No JVD, No cervical lymphadenopathy appriciated.  Symmetrical Chest wall movement, Good air movement bilaterally, CTAB RRR,No Gallops,Rubs or new Murmurs, No Parasternal Heave +ve B.Sounds, Abd Soft, Non tender, No organomegaly appriciated, No rebound -guarding or rigidity. No Cyanosis, Clubbing or edema, No new Rash or bruise  Data Review   CBC w  Diff:  Lab Results  Component Value Date   WBC 15.3 (H) 03/28/2018   HGB 8.5 (L) 03/28/2018   HCT 26.8 (L) 03/28/2018   PLT 373 03/28/2018   LYMPHOPCT 10 03/22/2018   MONOPCT 5 03/22/2018   EOSPCT 0 03/22/2018   BASOPCT 0 03/22/2018    CMP:  Lab Results  Component Value Date   NA 134 (L) 03/29/2018   K 5.0 03/29/2018   CL 103 03/29/2018   CO2 27 03/29/2018   BUN 15 03/29/2018   CREATININE 0.84 03/29/2018   PROT 6.7 03/26/2018   ALBUMIN 3.8 03/26/2018   BILITOT 0.8 03/26/2018   ALKPHOS 56 03/26/2018   AST 20 03/26/2018   ALT 15 03/26/2018  .   Total Time in preparing paper work, data evaluation and todays exam - 35 minutes  Epifanio Lesches M.D on 03/24/2018 at 10:34 AM    Note: This dictation was prepared with Dragon dictation  along with smaller phrase technology. Any transcriptional errors that result from this process are unintentional.

## 2018-03-29 NOTE — Care Management Important Message (Signed)
Important Message  Patient Details  Name: Kirstina Leinweber MRN: [id number] Date of Birth: [birth year]   Medicare Important Message Given:  Yes    Shelbie Hutching, RN 03/29/2018, 12:59 PM

## 2018-03-29 NOTE — Progress Notes (Signed)
[facility]  ROUNDING NOTE   Subjective:   Son and grandson at bedside.   K 5.  PO KCl  Objective:  Vital signs in last 24 hours:  Temp:  [97.7 F (36.5 C)-98.1 F (36.7 C)] 97.9 F (36.6 C) (02/10 0543) Pulse Rate:  [63-70] 70 (02/10 0543) Resp:  [16-20] 16 (02/10 0543) BP: (120-140)/(45-63) 140/58 (02/10 0543) SpO2:  [98 %-99 %] 99 % (02/10 0543)  Weight change:  Filed Weights   03/26/18 0944  Weight: 50 kg    Intake/Output: I/O last 3 completed shifts: In: 457.2 [P.O.:120; IV Piggyback:337.2] Out: 202 [Urine:200; Stool:2]   Intake/Output this shift:  No intake/output data recorded.  Physical Exam: General: NAD,   Head: Normocephalic, atraumatic. Moist oral mucosal membranes  Eyes: Anicteric, PERRL  Neck: Supple, trachea midline  Lungs:  Clear to auscultation  Heart: Regular rate and rhythm  Abdomen:  Soft, nontender,   Extremities: no peripheral edema.  Neurologic: Alert to self and place  Skin: No lesions        Basic Metabolic Panel: Recent Labs  Lab 03/23/18 0739  03/26/18 0950 03/27/18 0641 03/27/18 1443 03/28/18 0418 03/29/18 0434  NA 144  --  139 139  --  137 134*  K 2.9*   < > 2.6* 3.2* 3.3* 3.2* 5.0  CL 110  --  100 104  --  103 103  CO2 26  --  29 27  --  27 27  GLUCOSE 96  --  129* 76  --  95 98  BUN 26*  --  15 13  --  15 15  CREATININE 0.91  --  1.08* 0.82  --  0.90 0.84  CALCIUM 8.3*  --  9.3 8.4*  --  8.5* 8.5*  MG 1.8  --  1.8  --   --  1.6* 2.1   < > = values in this interval not displayed.    Liver Function Tests: Recent Labs  Lab 03/26/18 0950  AST 20  ALT 15  ALKPHOS 56  BILITOT 0.8  PROT 6.7  ALBUMIN 3.8   No results for input(s): LIPASE, AMYLASE in the last 168 hours. No results for input(s): AMMONIA in the last 168 hours.  CBC: Recent Labs  Lab 03/22/18 1530  03/24/18 0737 03/26/18 0950 03/27/18 0641 03/27/18 1443 03/28/18 0418  WBC  --   --   --  13.2* 11.0*  --  15.3*  HGB 8.7*   < >  7.5* 9.3* 8.4* 8.9* 8.5*  HCT 27.3*  --   --  29.3* 26.8* 28.9* 26.8*  MCV  --   --   --  84.4 84.0  --  83.5  PLT  --   --   --  469* 375  --  373   < > = values in this interval not displayed.    Cardiac Enzymes: Recent Labs  Lab 03/26/18 0950  TROPONINI <0.03    BNP: Invalid input(s): POCBNP  CBG: Recent Labs  Lab 03/27/18 1440  GLUCAP 114*    Microbiology: Results for orders placed or performed during the hospital encounter of 03/26/18  MRSA PCR Screening     Status: Abnormal   Collection Time: 03/27/18  2:49 AM  Result Value Ref Range Status   MRSA by PCR POSITIVE (A) NEGATIVE Final    Comment:        The GeneXpert MRSA Assay (FDA approved for NASAL specimens only), is one component of a comprehensive MRSA colonization  surveillance program. It is not intended to diagnose MRSA infection nor to guide or monitor treatment for MRSA infections. RESULT CALLED TO, READ BACK BY AND VERIFIED WITH: TEMEKA COLES ON 03/27/2018 AT 0423 QSD Performed at [facility], [address] [place], [place] [address]     Coagulation Studies: No results for input(s): LABPROT, INR in the last 72 hours.  Urinalysis: No results for input(s): COLORURINE, LABSPEC, PHURINE, GLUCOSEU, HGBUR, BILIRUBINUR, KETONESUR, PROTEINUR, UROBILINOGEN, NITRITE, LEUKOCYTESUR in the last 72 hours.  Invalid input(s): APPERANCEUR    Imaging: Ct Head Wo Contrast  Result Date: 03/27/2018 CLINICAL DATA:  Recurrent syncope EXAM: CT HEAD WITHOUT CONTRAST TECHNIQUE: Contiguous axial images were obtained from the base of the skull through the vertex without intravenous contrast. COMPARISON:  02/21/2018 FINDINGS: Brain: Remote right MCA territory infarct affecting the posterior insula, lateral frontal lobe, and anterior parietal lobe. No acute or interval infarct. No hemorrhage, hydrocephalus, or masslike finding. Cerebral volume loss without specific pattern Vascular: No hyperdense vessel or  unexpected calcification. Skull: Negative Sinuses/Orbits: Bilateral cataract resection IMPRESSION: 1. No acute finding. 2. Large remote right MCA branch infarct. Electronically Signed   By: Monte Fantasia M.D.   On: 03/27/2018 15:24   Ct Angio Neck W Or Wo Contrast  Result Date: 03/28/2018 CLINICAL DATA:  Recurrent syncope EXAM: CT ANGIOGRAPHY NECK TECHNIQUE: Multidetector CT imaging of the neck was performed using the standard protocol during bolus administration of intravenous contrast. Multiplanar CT image reconstructions and MIPs were obtained to evaluate the vascular anatomy. Carotid stenosis measurements (when applicable) are obtained utilizing NASCET criteria, using the distal internal carotid diameter as the denominator. CONTRAST:  85mL OMNIPAQUE IOHEXOL 350 MG/ML SOLN COMPARISON:  10/13/2017 FINDINGS: Aortic arch: Severe atherosclerosis with irregular plaque diffusely. Aneurysmal widening of the transverse arch 3.7 cm, non progressed. 2 vessel branching Right carotid system: Diffuse atheromatous plaque on the brachiocephalic, common carotid, and at the bifurcation. No ulceration or flow limiting stenosis. Left carotid system: Diffuse atheromatous wall thickening of the common carotid. Irregular mixed density plaque at the ICA bulb with 70% stenosis based on axial slice measurement. ICA looping. Vertebral arteries: Extensive subclavian atherosclerotic plaque. Calcified plaque at the proximal left subclavian causes 65% stenosis. There is severe atheromatous narrowing of both V1 segments. Otherwise the vertebral arteries are widely patent to the basilar. Skeleton: Cervical spine degeneration. Healed right distal clavicle fracture. Other neck: Interval increased density and volume loss of the right parotid. No surrounding inflammation or discrete mass is seen. Upper chest: No acute finding.  Mild emphysema. IMPRESSION: 1. Severe atherosclerosis. 2. In this patient with history of syncope there is bilateral  advanced vertebral origin stenosis and ~65% narrowing of the proximal left subclavian. 3. Bilateral cervical carotid atherosclerosis with up to 70% stenosis at the left ICA bulb. 4. 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 Electronically Signed   By: Monte Fantasia M.D.   On: 03/28/2018 10:28   US Carotid Bilateral  Result Date: 03/28/2018 CLINICAL DATA:  85 year old female history of syncope. EXAM: BILATERAL CAROTID DUPLEX ULTRASOUND TECHNIQUE: Pearline Cables scale imaging, color Doppler and duplex ultrasound were performed of bilateral carotid and vertebral arteries in the neck. COMPARISON:  No prior duplex FINDINGS: Criteria: Quantification of carotid stenosis is based on velocity parameters that correlate the residual internal carotid diameter with NASCET-based stenosis levels, using the diameter of the distal internal carotid lumen as the denominator for stenosis measurement. The following velocity measurements were  obtained: RIGHT ICA:  Systolic 765 cm/sec, Diastolic 12 cm/sec CCA:  64 cm/sec SYSTOLIC ICA/CCA RATIO:  2.0 ECA:  61 cm/sec LEFT ICA:  Systolic 465 cm/sec, Diastolic 30 cm/sec CCA:  47 cm/sec SYSTOLIC ICA/CCA RATIO:  4.3 ECA:  60 cm/sec Right Brachial SBP: Not acquired Left Brachial SBP: Not acquired RIGHT CAROTID ARTERY: Calcifications of the right common carotid artery. Intermediate waveform maintained. Moderate heterogeneous and partially calcified plaque at the right carotid bifurcation. Significant shadowing low resistance waveform of the right ICA. Tortuosity RIGHT VERTEBRAL ARTERY: Antegrade flow with low resistance waveform. LEFT CAROTID ARTERY: Calcifications of the left common carotid artery. Intermediate waveform maintained. Moderate heterogeneous and partially calcified plaque at the left carotid bifurcation. Significant shadowing low resistance waveform of the left ICA. Tortuosity LEFT VERTEBRAL ARTERY:  Antegrade flow with  postobstructive waveform IMPRESSION: Right: Heterogeneous and partially calcified plaque at the right carotid bifurcation contributes to 50%-69% stenosis by established duplex criteria. Left: Heterogeneous plaque at the left carotid bifurcation, with discordant results regarding degree of stenosis by established duplex criteria. Peak velocity suggests 50%-69% stenosis, with the ICA/ CCA ratio suggesting a higher degree stenosis. If establishing a more accurate degree of stenosis is required, cerebral angiogram should be considered, or as a second best test, CTA. Parvus tardus waveform of the left vertebral artery suggests more proximal stenosis. Signed, Dulcy Fanny. Dellia Nims, RPVI [facility] Radiology Electronically Signed   By: Corrie Mckusick D.O.   On: 03/28/2018 08:18     Medications:    . amLODipine  5 mg Oral Daily  . aspirin EC  81 mg Oral Daily  . atorvastatin  20 mg Oral q1800  . B-complex with vitamin C  1 tablet Oral Daily  . bisacodyl  10 mg Oral Daily  . Chlorhexidine Gluconate Cloth  6 each Topical Q0600  . cholecalciferol  3,000 Units Oral QHS  . cloNIDine  0.1 mg Oral BID  . gabapentin  100 mg Oral TID  . levothyroxine  25 mcg Oral QAC breakfast  . metoprolol tartrate  25 mg Oral BID  . mupirocin ointment  1 application Nasal BID  . pantoprazole  20 mg Oral Daily  . venlafaxine XR  150 mg Oral QHS   docusate sodium, HYDROcodone-acetaminophen, Melatonin, ondansetron  Assessment/ Plan:  [name] is a 85 y.o. white female with coronary artery disease, GERD, history of breast cancer, hyperlipidemia, hypertension, history of CVA, carpal tunnel syndrome, history of left-sided weakness, who was admitted to [facility] on 03/26/2018 for evaluation of altered mental status.  1.  Hypokalemia and hypomagnesemia: secondary to nutritional deficiency - Continue oral replacement   2. Hypertension: 140/58 - clonidine, metoprolol and  amlodipine - Consider adding an ACE-I to assist with potassium. Will start low dose lisinopril.    LOS: 3 Aeriana Speece 2/10/20202:23 PM

## 2018-03-29 NOTE — Progress Notes (Signed)
PHARMACY CONSULT NOTE - FOLLOW UP  Pharmacy Consult for Electrolyte Monitoring and Replacement   Recent Labs: Potassium (mmol/L)  Date Value  03/29/2018 5.0   Magnesium (mg/dL)  Date Value  03/29/2018 2.1   Calcium (mg/dL)  Date Value  03/29/2018 8.5 (L)   Albumin (g/dL)  Date Value  03/26/2018 3.8   Sodium (mmol/L)  Date Value  03/29/2018 134 (L)     Assessment: 85 year old female admitted with hypokalemia. Patient on potassium 40 mEq PO BID with potassium remaining approximately 3.2. Per Nephrology, suspect nutritional deficiency.  Received MagSulfate 2g IV x 1 and KCl 30mEq PO BID and KCl 58mEq IV x4 on 2/9.  Goal of Therapy:  Potassium 3.5-4 Magnesium ~ 2  Plan:  Mg 2.1: no additional supplementation K 5.0: discontinued KCl  31mEq PO bid.  Will follow up with morning labs.  Paulina Fusi, PharmD, BCPS 03/29/2018 9:21 AM

## 2018-03-29 NOTE — Progress Notes (Signed)
[facility] [place] NAME: Sonya [name]    MR#:  [id number]  DATE OF BIRTH:  19-Nov-1932  SUBJECTIVE:  CHIEF COMPLAINT: Patient is doing okay.  Seen by vascular, recommending to hold Eliquis, no other episodes of syncope Son at bedside  REVIEW OF SYSTEMS:  CONSTITUTIONAL: No fever, fatigue or weakness.  EYES: No blurred or double vision.  EARS, NOSE, AND THROAT: No tinnitus or ear pain.  RESPIRATORY: No cough, shortness of breath, wheezing or hemoptysis.  CARDIOVASCULAR: No chest pain, orthopnea, edema.  GASTROINTESTINAL: No nausea, vomiting, diarrhea or abdominal pain.  GENITOURINARY: No dysuria, hematuria.  ENDOCRINE: No polyuria, nocturia,  HEMATOLOGY: No anemia, easy bruising or bleeding SKIN: No rash or lesion. MUSCULOSKELETAL: No joint pain or arthritis.   NEUROLOGIC: No tingling, numbness, weakness.  PSYCHIATRY: No anxiety or depression.   DRUG ALLERGIES:   Allergies  Allergen Reactions  . Clopidogrel Nausea And Vomiting     reported by [place] [facility] 11/24/13  . Omeprazole Other (See Comments)    Unknown reaction - reported by [place] 11/24/13    VITALS:  Blood pressure (!) 140/58, pulse 70, temperature 97.9 F (36.6 C), temperature source Oral, resp. rate 16, height 5' (1.524 m), weight 50 kg, SpO2 99 %.  PHYSICAL EXAMINATION:  GENERAL:  85 y.o.-year-old patient lying in the bed with no acute distress.  EYES: Pupils equal, round, reactive to light and accommodation. No scleral icterus. Extraocular muscles intact.  HEENT: Head atraumatic, normocephalic. Oropharynx and nasopharynx clear.  NECK:  Supple, no jugular venous distention. No thyroid enlargement, no tenderness.  LUNGS: Normal breath sounds bilaterally, no wheezing, rales,rhonchi or crepitation. No use of accessory muscles of respiration.  CARDIOVASCULAR: S1, S2 normal. No murmurs, rubs, or gallops.  ABDOMEN: Soft,  nontender, nondistended. Bowel sounds present.  EXTREMITIES: No pedal edema, cyanosis, or clubbing.  NEUROLOGIC: Awake alert and oriented x3 sensation intact. Gait not checked.  PSYCHIATRIC: The patient is alert and oriented x 3.  SKIN: No obvious rash, lesion, or ulcer.    LABORATORY PANEL:   CBC Recent Labs  Lab 03/28/18 0418  WBC 15.3*  HGB 8.5*  HCT 26.8*  PLT 373   ------------------------------------------------------------------------------------------------------------------  Chemistries  Recent Labs  Lab 03/26/18 0950  03/29/18 0434  NA 139   < > 134*  K 2.6*   < > 5.0  CL 100   < > 103  CO2 29   < > 27  GLUCOSE 129*   < > 98  BUN 15   < > 15  CREATININE 1.08*   < > 0.84  CALCIUM 9.3   < > 8.5*  MG 1.8   < > 2.1  AST 20  --   --   ALT 15  --   --   ALKPHOS 56  --   --   BILITOT 0.8  --   --    < > = values in this interval not displayed.   ------------------------------------------------------------------------------------------------------------------  Cardiac Enzymes Recent Labs  Lab 03/26/18 0950  TROPONINI <0.03   ------------------------------------------------------------------------------------------------------------------  RADIOLOGY:  Ct Head Wo Contrast  Result Date: 03/27/2018 CLINICAL DATA:  Recurrent syncope EXAM: CT HEAD WITHOUT CONTRAST TECHNIQUE: Contiguous axial images were obtained from the base of the skull through the vertex without intravenous contrast. COMPARISON:  02/21/2018 FINDINGS: Brain: Remote right MCA territory infarct affecting the posterior insula, lateral frontal lobe, and anterior parietal lobe. No acute or interval infarct. No  hemorrhage, hydrocephalus, or masslike finding. Cerebral volume loss without specific pattern Vascular: No hyperdense vessel or unexpected calcification. Skull: Negative Sinuses/Orbits: Bilateral cataract resection IMPRESSION: 1. No acute finding. 2. Large remote right MCA branch infarct.  Electronically Signed   By: Monte Fantasia M.D.   On: 03/27/2018 15:24   Ct Angio Neck W Or Wo Contrast  Result Date: 03/28/2018 CLINICAL DATA:  Recurrent syncope EXAM: CT ANGIOGRAPHY NECK TECHNIQUE: Multidetector CT imaging of the neck was performed using the standard protocol during bolus administration of intravenous contrast. Multiplanar CT image reconstructions and MIPs were obtained to evaluate the vascular anatomy. Carotid stenosis measurements (when applicable) are obtained utilizing NASCET criteria, using the distal internal carotid diameter as the denominator. CONTRAST:  39mL OMNIPAQUE IOHEXOL 350 MG/ML SOLN COMPARISON:  10/13/2017 FINDINGS: Aortic arch: Severe atherosclerosis with irregular plaque diffusely. Aneurysmal widening of the transverse arch 3.7 cm, non progressed. 2 vessel branching Right carotid system: Diffuse atheromatous plaque on the brachiocephalic, common carotid, and at the bifurcation. No ulceration or flow limiting stenosis. Left carotid system: Diffuse atheromatous wall thickening of the common carotid. Irregular mixed density plaque at the ICA bulb with 70% stenosis based on axial slice measurement. ICA looping. Vertebral arteries: Extensive subclavian atherosclerotic plaque. Calcified plaque at the proximal left subclavian causes 65% stenosis. There is severe atheromatous narrowing of both V1 segments. Otherwise the vertebral arteries are widely patent to the basilar. Skeleton: Cervical spine degeneration. Healed right distal clavicle fracture. Other neck: Interval increased density and volume loss of the right parotid. No surrounding inflammation or discrete mass is seen. Upper chest: No acute finding.  Mild emphysema. IMPRESSION: 1. Severe atherosclerosis. 2. In this patient with history of syncope there is bilateral advanced vertebral origin stenosis and ~65% narrowing of the proximal left subclavian. 3. Bilateral cervical carotid atherosclerosis with up to 70% stenosis at  the left ICA bulb. 4. 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 Electronically Signed   By: Monte Fantasia M.D.   On: 03/28/2018 10:28   US Carotid Bilateral  Result Date: 03/28/2018 CLINICAL DATA:  85 year old female history of syncope. EXAM: BILATERAL CAROTID DUPLEX ULTRASOUND TECHNIQUE: Pearline Cables scale imaging, color Doppler and duplex ultrasound were performed of bilateral carotid and vertebral arteries in the neck. COMPARISON:  No prior duplex FINDINGS: Criteria: Quantification of carotid stenosis is based on velocity parameters that correlate the residual internal carotid diameter with NASCET-based stenosis levels, using the diameter of the distal internal carotid lumen as the denominator for stenosis measurement. The following velocity measurements were obtained: RIGHT ICA:  Systolic 865 cm/sec, Diastolic 12 cm/sec CCA:  64 cm/sec SYSTOLIC ICA/CCA RATIO:  2.0 ECA:  61 cm/sec LEFT ICA:  Systolic 784 cm/sec, Diastolic 30 cm/sec CCA:  47 cm/sec SYSTOLIC ICA/CCA RATIO:  4.3 ECA:  60 cm/sec Right Brachial SBP: Not acquired Left Brachial SBP: Not acquired RIGHT CAROTID ARTERY: Calcifications of the right common carotid artery. Intermediate waveform maintained. Moderate heterogeneous and partially calcified plaque at the right carotid bifurcation. Significant shadowing low resistance waveform of the right ICA. Tortuosity RIGHT VERTEBRAL ARTERY: Antegrade flow with low resistance waveform. LEFT CAROTID ARTERY: Calcifications of the left common carotid artery. Intermediate waveform maintained. Moderate heterogeneous and partially calcified plaque at the left carotid bifurcation. Significant shadowing low resistance waveform of the left ICA. Tortuosity LEFT VERTEBRAL ARTERY:  Antegrade flow with postobstructive waveform IMPRESSION: Right: Heterogeneous and partially calcified plaque at the right carotid bifurcation contributes to 50%-69% stenosis  by  established duplex criteria. Left: Heterogeneous plaque at the left carotid bifurcation, with discordant results regarding degree of stenosis by established duplex criteria. Peak velocity suggests 50%-69% stenosis, with the ICA/ CCA ratio suggesting a higher degree stenosis. If establishing a more accurate degree of stenosis is required, cerebral angiogram should be considered, or as a second best test, CTA. Parvus tardus waveform of the left vertebral artery suggests more proximal stenosis. Signed, Dulcy Fanny. Dellia Nims, RPVI [facility] Radiology Electronically Signed   By: Corrie Mckusick D.O.   On: 03/28/2018 08:18    EKG:   Orders placed or performed during the hospital encounter of 03/22/18  . ED EKG  . ED EKG    ASSESSMENT AND PLAN:   Syncope looks like vasovagal , high-grade stenosis of ICAs- Telemetry monitoring CT head no acute findings Carotid Dopplers-revealed high-grade stenosis CT angiogram is done revealing 70% stenosis , vascular surgery consult placed as the patient is having recurrent syncopal episode-scheduled  for bilateral carotid angiogram on Wednesday, 03/31/2018 by Dr. Lucky Cowboy Holding aspirin and Eliquis home medications echocardiogram-60 to 65% ejection fraction cavity size was normal Cardiology consult to Dr. Ubaldo Glassing Neurochecks PT evaluation-skilled nursing facility is recommended Check orthostatics Check CBC stat given the recent history of GI bleed   Hypokalemia and generalized weakness Likely malnutrition-dietitian consult Normal magnesium Patient had significant and recurrent hypokalemia during last admission also. Repleted orally and start her on potassium rich diet Seen by nephrology Dr. Zollie Scale, giving IV potassium for better absorption PCP to consider checking potassium level during the follow-up visit Goal is to keep potassium greater than 4 and magnesium greater than 2   *Altered mental status-resolved This  happened at home, patient is currently alert and oriented. UA is negative, chest x-ray is clear, influenza test is negative. Son denies any fever.  *Recent GI bleed Continue PPI twice daily and holding anticoagulation  as recommended by vascular surgery for bilateral carotid angiogram on Wednesday, 03/31/2018 by Dr. Lucky Cowboy  *Uncontrolled hypertension Continue amlodipine and clonidine, added metoprolol.  *Tachycardia Resolved after adding metoprolol  * hypothyroidism Continue levothyroxine.   Normal TSH.  *History of stroke Patient is on Eliquis, continue the same.   All the records are reviewed and case discussed with Care Management/Social Workerr. Management plans discussed with the patient, family and they are in agreement.  CODE STATUS: DNR  TOTAL TIME TAKING CARE OF THIS PATIENT: 36  minutes.   POSSIBLE D/C IN 1-2 DAYS, DEPENDING ON CLINICAL CONDITION.  Note: This dictation was prepared with Dragon dictation along with smaller phrase technology. Any transcriptional errors that result from this process are unintentional.   Nicholes Mango M.D on 03/29/2018 at 2:58 PM  Between 7am to 6pm - Pager - [phone] After 6pm go to [email or web address] - password [email or web address] [facility]  Office  [phone]  CC: Primary care physician; Baxter Hire, MD

## 2018-03-29 NOTE — Clinical Social Work Note (Signed)
Clinical Social Work Assessment  Patient Details  Name: Sonya [name] MRN: [id number] Date of Birth: Aug 04, 1932  Date of referral:  03/29/18               Reason for consult:  Facility Placement                Permission sought to share information with:  Case Manager, Customer service manager, Family Supports Permission granted to share information::  Yes, Verbal Permission Granted  Name::      SNF   Agency::   [place]   Relationship::     Contact Information:     Housing/Transportation Living arrangements for the past 2 months:  Single Family Home Source of Information:  Adult Children Patient Interpreter Needed:  None Criminal Activity/Legal Involvement Pertinent to Current Situation/Hospitalization:  No - Comment as needed Significant Relationships:  Adult Children, Spouse Lives with:  Spouse Do you feel safe going back to the place where you live?  Yes Need for family participation in patient care:  Yes (Comment)  Care giving concerns:  Patient lives with her husband    Facilities manager / plan:  CSW consulted for SNF placement. CSW attempted to meet with patient but she is asleep. CSW contacted patient's son Artice Holohan [phone]. Per son, patient lives with husband and son checks on patient daily and gives her medications. CSW explained PT recommendation of SNF. Son states that he does not want patient to go to SNF and that she will return home. Son states that patient has used [facility] in the past and would like to use them again. CSW notified RNCM of above. CSW signing off. Please re consult if further needs arise.   Employment status:  Retired Nurse, adult PT Recommendations:  [place] / Referral to community resources:  [place]  Patient/Family's Response to care:  Son thanked CSW for assistance   Patient/Family's Understanding of and Emotional Response to Diagnosis,  Current Treatment, and Prognosis:  Son states understanding of current diagnoses and treatment   Emotional Assessment Appearance:  Appears stated age Attitude/Demeanor/Rapport:  Lethargic Affect (typically observed):    Orientation:  Oriented to Self Alcohol / Substance use:  Not Applicable Psych involvement (Current and /or in the community):  No (Comment)  Discharge Needs  Concerns to be addressed:  Discharge Planning Concerns Readmission within the last 30 days:  No Current discharge risk:  Dependent with Mobility Barriers to Discharge:  Continued Medical Work up   [employer], [place] 03/29/2018, 4:03 PM

## 2018-03-29 NOTE — Care Management Note (Signed)
Case Management Note  Patient Details  Name: Sonya [name] MRN: [id number] Date of Birth: 06-Aug-1932  Subjective/Objective:    Patient admitted with hypokalemia.  Patient is from home where she lives with her husband, her son comes and checks on her every day.  Family wants patient to return home with home health services, home health agency choice offered.  Family chooses [facility] they have had services with them in the past, Drue Novel with [facility] notified of pending referral for [facility].  RNCM will cont to follow until discharge. Doran Clay RN BSN [phone]                  Action/Plan:   Expected Discharge Date:  03/27/18               Expected Discharge Plan:  [place]  In-House Referral:     Discharge planning Services  CM Consult  Post Acute Care Choice:    Choice offered to:     DME Arranged:    DME Agency:     HH Arranged:    HH Agency:     Status of Service:  In process, will continue to follow  If discussed at Long Length of Stay Meetings, dates discussed:    Additional Comments:  Shelbie Hutching, RN 03/29/2018, 3:49 PM

## 2018-03-29 NOTE — Progress Notes (Signed)
[facility] Daily Progress Note    Vascular Surgery Communication Note 1) Seen as a consult by Dr. Lincoln Maxin over the weekend. 2) Patient with vertebral and significant left carotid disease.  3) Multiple medical issues increasing surgical risk. 4) Will plan on bilateral carotid angiogram on Wed (03/31/18) with Dr. Lucky Cowboy to assess patient anatomy and degree of vertebral and carotid disease.  5) Will pre-op.  Discussed with Dr. Ellis Parents [place] PA-C 03/29/2018 1:48 PM

## 2018-03-30 ENCOUNTER — Other Ambulatory Visit (INDEPENDENT_AMBULATORY_CARE_PROVIDER_SITE_OTHER): Payer: Self-pay | Admitting: Vascular Surgery

## 2018-03-30 DIAGNOSIS — I6523 Occlusion and stenosis of bilateral carotid arteries: Principal | ICD-10-CM

## 2018-03-30 DIAGNOSIS — E43 Unspecified severe protein-calorie malnutrition: Secondary | ICD-10-CM

## 2018-03-30 LAB — BASIC METABOLIC PANEL
Anion gap: 4 — ABNORMAL LOW (ref 5–15)
BUN: 15 mg/dL (ref 8–23)
CALCIUM: 8.5 mg/dL — AB (ref 8.9–10.3)
CO2: 27 mmol/L (ref 22–32)
Chloride: 103 mmol/L (ref 98–111)
Creatinine, Ser: 0.77 mg/dL (ref 0.44–1.00)
GFR calc non Af Amer: >60 mL/min (ref >60)
Glucose, Bld: 102 mg/dL — ABNORMAL HIGH (ref 70–99)
Potassium: 4.6 mmol/L (ref 3.5–5.1)
Sodium: 134 mmol/L — ABNORMAL LOW (ref 135–145)

## 2018-03-30 LAB — MAGNESIUM: Magnesium: 1.8 mg/dL (ref 1.7–2.4)

## 2018-03-30 MED ORDER — CEFAZOLIN SODIUM-DEXTROSE 2-4 GM/100ML-% IV SOLN
2.0000 g | INTRAVENOUS | Status: AC
  Administered 2018-03-31: 2 g via INTRAVENOUS
  Filled 2018-03-30: qty 100

## 2018-03-30 MED ORDER — SODIUM CHLORIDE 0.9 % IV SOLN
INTRAVENOUS | Status: DC
  Administered 2018-03-31: 03:00:00 via INTRAVENOUS

## 2018-03-30 MED ORDER — TRAMADOL HCL 50 MG PO TABS
50.0000 mg | ORAL_TABLET | Freq: Two times a day (BID) | ORAL | Status: DC | PRN
  Administered 2018-03-31 – 2018-04-01 (×3): 50 mg via ORAL
  Filled 2018-03-30 (×3): qty 1

## 2018-03-30 NOTE — Progress Notes (Signed)
[facility] Cardiology  SUBJECTIVE: Ms. Blomberg is an 85 year old female with a past medical history significant for a right CVA who reports several recent episodes of dizziness and syncope.  Scheduled to undergo bilateral carotid angiogram on 03/31/2018 with Dr. Lucky Cowboy.  Patient is a difficult historian, but denies any current pain.    Vitals:   03/29/18 2053 03/30/18 0203 03/30/18 0203 03/30/18 0404  BP: (!) 155/69 (!) 150/69 (!) 150/69 (!) 148/59  Pulse: 76 79 79 64  Resp: 12 18 18 16   Temp: 97.9 F (36.6 C) 97.6 F (36.4 C) 97.6 F (36.4 C) 98 F (36.7 C)  TempSrc: Oral Oral Oral Oral  SpO2: 97% 99% 99% 97%  Weight:      Height:         Intake/Output Summary (Last 24 hours) at 03/30/2018 9381 Last data filed at 03/30/2018 0640 Gross per 24 hour  Intake -  Output 300 ml  Net -300 ml      PHYSICAL EXAM  General: Well developed, well nourished, in no acute distress HEENT:  Normocephalic and atramatic Neck:  No JVD.  Lungs: Clear bilaterally to auscultation and percussion. Heart: HRRR . Normal S1 and S2 without gallops or murmurs.  Abdomen: Bowel sounds are positive, abdomen soft and non-tender  Msk:  Back normal.  Normal strength and tone for age. Gait not assessed. Extremities: No clubbing, cyanosis or edema.   Neuro: Alert and oriented X 3. Psych:  Good affect, responds appropriately   LABS: Basic Metabolic Panel: Recent Labs    03/29/18 0434 03/30/18 0441  NA 134* 134*  K 5.0 4.6  CL 103 103  CO2 27 27  GLUCOSE 98 102*  BUN 15 15  CREATININE 0.84 0.77  CALCIUM 8.5* 8.5*  MG 2.1 1.8   Liver Function Tests: No results for input(s): AST, ALT, ALKPHOS, BILITOT, PROT, ALBUMIN in the last 72 hours. No results for input(s): LIPASE, AMYLASE in the last 72 hours. CBC: Recent Labs    03/27/18 1443 03/28/18 0418  WBC  --  15.3*  HGB 8.9* 8.5*  HCT 28.9* 26.8*  MCV  --  83.5  PLT  --  373   Cardiac Enzymes: No results for input(s): CKTOTAL, CKMB, CKMBINDEX,  TROPONINI in the last 72 hours. BNP: Invalid input(s): POCBNP D-Dimer: No results for input(s): DDIMER in the last 72 hours. Hemoglobin A1C: No results for input(s): HGBA1C in the last 72 hours. Fasting Lipid Panel: No results for input(s): CHOL, HDL, LDLCALC, TRIG, CHOLHDL, LDLDIRECT in the last 72 hours. Thyroid Function Tests: No results for input(s): TSH, T4TOTAL, T3FREE, THYROIDAB in the last 72 hours.  Invalid input(s): FREET3 Anemia Panel: No results for input(s): VITAMINB12, FOLATE, FERRITIN, TIBC, IRON, RETICCTPCT in the last 72 hours.  Ct Angio Neck W Or Wo Contrast  Result Date: 03/28/2018 CLINICAL DATA:  Recurrent syncope EXAM: CT ANGIOGRAPHY NECK TECHNIQUE: Multidetector CT imaging of the neck was performed using the standard protocol during bolus administration of intravenous contrast. Multiplanar CT image reconstructions and MIPs were obtained to evaluate the vascular anatomy. Carotid stenosis measurements (when applicable) are obtained utilizing NASCET criteria, using the distal internal carotid diameter as the denominator. CONTRAST:  42mL OMNIPAQUE IOHEXOL 350 MG/ML SOLN COMPARISON:  10/13/2017 FINDINGS: Aortic arch: Severe atherosclerosis with irregular plaque diffusely. Aneurysmal widening of the transverse arch 3.7 cm, non progressed. 2 vessel branching Right carotid system: Diffuse atheromatous plaque on the brachiocephalic, common carotid, and at the bifurcation. No ulceration or flow limiting stenosis. Left  carotid system: Diffuse atheromatous wall thickening of the common carotid. Irregular mixed density plaque at the ICA bulb with 70% stenosis based on axial slice measurement. ICA looping. Vertebral arteries: Extensive subclavian atherosclerotic plaque. Calcified plaque at the proximal left subclavian causes 65% stenosis. There is severe atheromatous narrowing of both V1 segments. Otherwise the vertebral arteries are widely patent to the basilar. Skeleton: Cervical spine  degeneration. Healed right distal clavicle fracture. Other neck: Interval increased density and volume loss of the right parotid. No surrounding inflammation or discrete mass is seen. Upper chest: No acute finding.  Mild emphysema. IMPRESSION: 1. Severe atherosclerosis. 2. In this patient with history of syncope there is bilateral advanced vertebral origin stenosis and ~65% narrowing of the proximal left subclavian. 3. Bilateral cervical carotid atherosclerosis with up to 70% stenosis at the left ICA bulb. 4. 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 Electronically Signed   By: Monte Fantasia M.D.   On: 03/28/2018 10:28     Echo: Normal LV function with an EF estimated between 60-65%.    ASSESSMENT AND PLAN:  Principal Problem:   Hypokalemia    1.  Syncope/history of CVA  -Scheduled for bilateral carotid angiogram with Dr Lucky Cowboy on 03/31/18; okay to hold Eliquis;  Optimized to proceed from a cardiovascular perspective   -Echocardiogram revealed normal LV function  2.  Hypertension   -Continue amlodipine 5mg , clonidine, and metoprolol  3.  Hypokalemia   -Resolved; continue to monitor   The history, physical exam findings, and plan of care were all discussed with Dr. Bartholome Bill, and all decision making was made in collaboration.   Avie Arenas  PA-C 03/30/2018 8:37 AM

## 2018-03-30 NOTE — Progress Notes (Signed)
[facility] Daily Progress Note    Subjective: Seen initially by Dr. Lincoln Maxin over the weekend. History of R CVA Aug 2018 and subsequent left sided paresis, with repeat episodes of syncope since CVA.  Per patient she has had episodes of dizziness and passing out since her CVA.  She does not complain of new or worsening weakness, difficulty swallowing or speaking.  She states she is still in therapy for her left sided weakness.  CTA with bilateral vertebral artery stenosis at the origin, severe LICA stenosis with eggshell calcification, and left SCA stenosis.  Objective: Vitals:   03/29/18 2053 03/30/18 0203 03/30/18 0203 03/30/18 0404  BP: (!) 155/69 (!) 150/69 (!) 150/69 (!) 148/59  Pulse: 76 79 79 64  Resp: 12 18 18 16   Temp: 97.9 F (36.6 C) 97.6 F (36.4 C) 97.6 F (36.4 C) 98 F (36.7 C)  TempSrc: Oral Oral Oral Oral  SpO2: 97% 99% 99% 97%  Weight:      Height:        Intake/Output Summary (Last 24 hours) at 03/30/2018 1458 Last data filed at 03/30/2018 0640 Gross per 24 hour  Intake -  Output 300 ml  Net -300 ml   Physical Exam: [name], NAD CV: RRR Pulmonary: CTA Bilaterally Abdomen: Soft, Nontender, Nondistended Vascular: 2+ upper / lower extremities Neuro: Right sided paralysis / weakness noted   Laboratory: CBC    Component Value Date/Time   WBC 15.3 (H) 03/28/2018 0418   HGB 8.5 (L) 03/28/2018 0418   HCT 26.8 (L) 03/28/2018 0418   PLT 373 03/28/2018 0418   BMET    Component Value Date/Time   NA 134 (L) 03/30/2018 0441   K 4.6 03/30/2018 0441   CL 103 03/30/2018 0441   CO2 27 03/30/2018 0441   GLUCOSE 102 (H) 03/30/2018 0441   BUN 15 03/30/2018 0441   CREATININE 0.77 03/30/2018 0441   CALCIUM 8.5 (L) 03/30/2018 0441   GFRNONAA >60 03/30/2018 0441   GFRAA >60 03/30/2018 0441   Assessment/Planning: 85 year old female with past medical history of CVA, CAS and vertebral artery stenosis 1) Plan if for carotid angiogram tomorrow  to assess anatomy / degress of carotid disease to ascertain if patient is candidate for open vs stenting repair 2) Procedure, risks and benefits explained to patient. All questions answered. Patient wishes to proceed. 3) Plan in for carotid angiogram tomorrow with Dr. Lucky Cowboy 4) Will pre-op  Discussed with Dr. Ellis Parents  PA-C 03/30/2018 2:58 PM

## 2018-03-30 NOTE — Progress Notes (Signed)
[facility] [place] NAME: Sonya [name]    MR#:  [id number]  DATE OF BIRTH:  1933/01/01  SUBJECTIVE:  CHIEF COMPLAINT: Patient is doing okay.  Seen by vascular, recommending to hold Eliquis, scheduled for carotid angiogram in a.m. Son at bedside  REVIEW OF SYSTEMS:  CONSTITUTIONAL: No fever, fatigue or weakness.  EYES: No blurred or double vision.  EARS, NOSE, AND THROAT: No tinnitus or ear pain.  RESPIRATORY: No cough, shortness of breath, wheezing or hemoptysis.  CARDIOVASCULAR: No chest pain, orthopnea, edema.  GASTROINTESTINAL: No nausea, vomiting, diarrhea or abdominal pain.  GENITOURINARY: No dysuria, hematuria.  ENDOCRINE: No polyuria, nocturia,  HEMATOLOGY: No anemia, easy bruising or bleeding SKIN: No rash or lesion. MUSCULOSKELETAL: No joint pain or arthritis.   NEUROLOGIC: No tingling, numbness, weakness.  PSYCHIATRY: No anxiety or depression.   DRUG ALLERGIES:   Allergies  Allergen Reactions  . Clopidogrel Nausea And Vomiting     reported by [place] 11/24/13  . Omeprazole Other (See Comments)    Unknown reaction - reported by [place] 11/24/13    VITALS:  Blood pressure (!) 148/59, pulse 64, temperature 98 F (36.7 C), temperature source Oral, resp. rate 16, height 5' (1.524 m), weight 50 kg, SpO2 97 %.  PHYSICAL EXAMINATION:  GENERAL:  85 y.o.-year-old patient lying in the bed with no acute distress.  EYES: Pupils equal, round, reactive to light and accommodation. No scleral icterus. Extraocular muscles intact.  HEENT: Head atraumatic, normocephalic. Oropharynx and nasopharynx clear.  NECK:  Supple, no jugular venous distention. No thyroid enlargement, no tenderness.  LUNGS: Normal breath sounds bilaterally, no wheezing, rales,rhonchi or crepitation. No use of accessory muscles of respiration.  CARDIOVASCULAR: S1, S2 normal. No murmurs, rubs, or gallops.  ABDOMEN:  Soft, nontender, nondistended. Bowel sounds present.  EXTREMITIES: No pedal edema, cyanosis, or clubbing.  NEUROLOGIC: Awake alert and oriented x3 sensation intact. Gait not checked.  PSYCHIATRIC: The patient is alert and oriented x 3.  SKIN: No obvious rash, lesion, or ulcer.    LABORATORY PANEL:   CBC Recent Labs  Lab 03/28/18 0418  WBC 15.3*  HGB 8.5*  HCT 26.8*  PLT 373   ------------------------------------------------------------------------------------------------------------------  Chemistries  Recent Labs  Lab 03/26/18 0950  03/30/18 0441  NA 139   < > 134*  K 2.6*   < > 4.6  CL 100   < > 103  CO2 29   < > 27  GLUCOSE 129*   < > 102*  BUN 15   < > 15  CREATININE 1.08*   < > 0.77  CALCIUM 9.3   < > 8.5*  MG 1.8   < > 1.8  AST 20  --   --   ALT 15  --   --   ALKPHOS 56  --   --   BILITOT 0.8  --   --    < > = values in this interval not displayed.   ------------------------------------------------------------------------------------------------------------------  Cardiac Enzymes Recent Labs  Lab 03/26/18 0950  TROPONINI <0.03   ------------------------------------------------------------------------------------------------------------------  RADIOLOGY:  No results found.  EKG:   Orders placed or performed during the hospital encounter of 03/22/18  . ED EKG  . ED EKG    ASSESSMENT AND PLAN:   Syncope looks like vasovagal , high-grade stenosis of ICAs- Telemetry monitoring CT head no acute findings Carotid Dopplers-revealed high-grade stenosis CT angiogram is done revealing 70% stenosis , vascular  surgery consult placed as the patient is having recurrent syncopal episode-scheduled  for bilateral carotid angiogram on Wednesday, 03/31/2018 by Dr. Lucky Cowboy.  N.p.o. after midnight Holding aspirin and Eliquis home medications echocardiogram-60 to 65% ejection fraction cavity size was normal Cardiology consult to Dr. Ubaldo Glassing Neurochecks PT  evaluation-skilled nursing facility is recommended Check orthostatics Check CBC stat given the recent history of GI bleed   Hypokalemia and generalized weakness From malnutrition-dietitian consult Normal magnesium Patient had significant and recurrent hypokalemia during last admission also. Repleted orally and start her on potassium rich diet Seen by nephrology Dr. Zollie Scale, giving IV potassium for better absorption PCP to consider checking potassium level during the follow-up visit Goal is to keep potassium greater than 4 and magnesium greater than 2  Severe malnutrition seen by dietitian dietary supplements as recommended by them   *Altered mental status-resolved This happened at home, patient is currently alert and oriented. UA is negative, chest x-ray is clear, influenza test is negative. Son denies any fever.  *Recent GI bleed Continue PPI twice daily and holding anticoagulation  as recommended by vascular surgery for bilateral carotid angiogram on Wednesday, 03/31/2018 by Dr. Lucky Cowboy  *Uncontrolled hypertension Continue amlodipine and clonidine, added metoprolol.  *Tachycardia Resolved after adding metoprolol  * hypothyroidism Continue levothyroxine.   Normal TSH.  *History of stroke Patient is on Eliquis, continue the same.  *Generalized weakness PT assessment pending   All the records are reviewed and case discussed with Care Management/Social Workerr. Management plans discussed with the patient, family and they are in agreement.  CODE STATUS: DNR  TOTAL TIME TAKING CARE OF THIS PATIENT: 36  minutes.   POSSIBLE D/C IN 1-2 DAYS, DEPENDING ON CLINICAL CONDITION.  Note: This dictation was prepared with Dragon dictation along with smaller phrase technology. Any transcriptional errors that result from this process are unintentional.   Nicholes Mango M.D on 03/30/2018 at 3:06 PM  Between 7am to 6pm - Pager - [phone] After 6pm go to [email or web address] - password  [facility]  Office  [phone]  CC: Primary care physician; Baxter Hire, MD

## 2018-03-30 NOTE — Progress Notes (Signed)
PHARMACY CONSULT NOTE - FOLLOW UP  Pharmacy Consult for Electrolyte Monitoring and Replacement   Recent Labs: Potassium (mmol/L)  Date Value  03/30/2018 4.6   Magnesium (mg/dL)  Date Value  03/30/2018 1.8   Calcium (mg/dL)  Date Value  03/30/2018 8.5 (L)   Albumin (g/dL)  Date Value  03/26/2018 3.8   Sodium (mmol/L)  Date Value  03/30/2018 134 (L)     Assessment: 85 year old female admitted with hypokalemia. Patient on potassium 40 mEq PO BID with potassium remaining approximately 3.2. Per Nephrology, suspect nutritional deficiency.  Goal of Therapy:  Potassium 3.5-4 Magnesium ~ 2  Plan:  Mg 1.8: no additional supplementation K 4.6: no additional supplementation  Will follow up with morning labs.  Paulina Fusi, PharmD, BCPS 03/30/2018 9:06 AM

## 2018-03-30 NOTE — Progress Notes (Signed)
PT Cancellation Note  Patient Details Name: Teal Raben MRN: [id number] DOB: 10/14/1932   Cancelled Treatment:    Reason Eval/Treat Not Completed: Other (comment): Upon entering room pt found sleeping soundly with family in the room.  Family stated pt very fatigued this date and requested no PT services today.  Will attempt to see pt at a future date/time as appropriate.    Linus Salmons PT, DPT 03/30/18, 3:05 PM

## 2018-03-31 ENCOUNTER — Encounter
Admission: EM | Disposition: A | Discharge status: Home Health | Payer: Self-pay | Source: Home / Self Care | Attending: Internal Medicine

## 2018-03-31 DIAGNOSIS — I6523 Occlusion and stenosis of bilateral carotid arteries: Secondary | ICD-10-CM

## 2018-03-31 DIAGNOSIS — I70208 Unspecified atherosclerosis of native arteries of extremities, other extremity: Secondary | ICD-10-CM

## 2018-03-31 DIAGNOSIS — I672 Cerebral atherosclerosis: Secondary | ICD-10-CM

## 2018-03-31 HISTORY — PX: CAROTID ANGIOGRAPHY: CATH118230

## 2018-03-31 LAB — BASIC METABOLIC PANEL
Anion gap: 5 (ref 5–15)
BUN: 16 mg/dL (ref 8–23)
CO2: 25 mmol/L (ref 22–32)
Calcium: 8.6 mg/dL — ABNORMAL LOW (ref 8.9–10.3)
Chloride: 104 mmol/L (ref 98–111)
Creatinine, Ser: 0.7 mg/dL (ref 0.44–1.00)
GFR calc Af Amer: >60 mL/min (ref >60)
GFR calc non Af Amer: >60 mL/min (ref >60)
Glucose, Bld: 94 mg/dL (ref 70–99)
Potassium: 4.5 mmol/L (ref 3.5–5.1)
Sodium: 134 mmol/L — ABNORMAL LOW (ref 135–145)

## 2018-03-31 LAB — MAGNESIUM: Magnesium: 1.8 mg/dL (ref 1.7–2.4)

## 2018-03-31 SURGERY — CAROTID ANGIOGRAPHY
Anesthesia: Moderate Sedation | Laterality: Bilateral

## 2018-03-31 MED ORDER — HYDROMORPHONE HCL 1 MG/ML IJ SOLN
INTRAMUSCULAR | Status: AC
  Filled 2018-03-31: qty 0.5

## 2018-03-31 MED ORDER — HEPARIN SODIUM (PORCINE) 1000 UNIT/ML IJ SOLN
INTRAMUSCULAR | Status: AC
  Filled 2018-03-31: qty 1

## 2018-03-31 MED ORDER — FENTANYL CITRATE (PF) 100 MCG/2ML IJ SOLN
INTRAMUSCULAR | Status: AC
  Filled 2018-03-31: qty 2

## 2018-03-31 MED ORDER — SODIUM CHLORIDE 0.9 % IV SOLN: INTRAVENOUS | Status: DC

## 2018-03-31 MED ORDER — HEPARIN (PORCINE) IN NACL 1000-0.9 UT/500ML-% IV SOLN
INTRAVENOUS | Status: AC
  Filled 2018-03-31: qty 1000

## 2018-03-31 MED ORDER — DIPHENHYDRAMINE HCL 50 MG/ML IJ SOLN: 50.0000 mg | Freq: Once | INTRAMUSCULAR | Status: DC

## 2018-03-31 MED ORDER — MIDAZOLAM HCL 2 MG/2ML IJ SOLN
INTRAMUSCULAR | Status: DC | PRN
  Administered 2018-03-31: 1 mg via INTRAVENOUS

## 2018-03-31 MED ORDER — MIDAZOLAM HCL 2 MG/ML PO SYRP
8.0000 mg | ORAL_SOLUTION | Freq: Once | ORAL | Status: DC | PRN
  Filled 2018-03-31: qty 4

## 2018-03-31 MED ORDER — MIDAZOLAM HCL 5 MG/5ML IJ SOLN
INTRAMUSCULAR | Status: AC
  Filled 2018-03-31: qty 5

## 2018-03-31 MED ORDER — METHYLPREDNISOLONE SODIUM SUCC 125 MG IJ SOLR: 125.0000 mg | INTRAMUSCULAR | Status: DC | PRN

## 2018-03-31 MED ORDER — FENTANYL CITRATE (PF) 100 MCG/2ML IJ SOLN
INTRAMUSCULAR | Status: DC | PRN
  Administered 2018-03-31: 25 ug via INTRAVENOUS

## 2018-03-31 MED ORDER — ONDANSETRON HCL 4 MG/2ML IJ SOLN: 4.0000 mg | Freq: Four times a day (QID) | INTRAMUSCULAR | Status: DC | PRN

## 2018-03-31 MED ORDER — FAMOTIDINE 20 MG PO TABS: 40.0000 mg | ORAL_TABLET | ORAL | Status: DC | PRN

## 2018-03-31 MED ORDER — HYDROMORPHONE HCL 1 MG/ML IJ SOLN
1.0000 mg | Freq: Once | INTRAMUSCULAR | Status: AC | PRN
  Administered 2018-03-31: 0.5 mg via INTRAVENOUS

## 2018-03-31 MED ORDER — HEPARIN SODIUM (PORCINE) 1000 UNIT/ML IJ SOLN
INTRAMUSCULAR | Status: DC | PRN
  Administered 2018-03-31: 3000 [IU] via INTRAVENOUS

## 2018-03-31 MED ORDER — LIDOCAINE-EPINEPHRINE (PF) 1 %-1:200000 IJ SOLN
INTRAMUSCULAR | Status: AC
  Filled 2018-03-31: qty 30

## 2018-03-31 SURGICAL SUPPLY — 13 items
CATH ANGIO 5F 100CM .035 PIG (CATHETERS) ×2 IMPLANT
CATH BEACON 5 .035 100 JB2 TIP (CATHETERS) ×2 IMPLANT
CATH BEACON 5 .035 100 SIM1 TP (CATHETERS) ×2 IMPLANT
CATH G 5FX100 (CATHETERS) ×2 IMPLANT
DEVICE STARCLOSE SE CLOSURE (Vascular Products) ×2 IMPLANT
DEVICE TORQUE .025-.038 (MISCELLANEOUS) ×2 IMPLANT
GUIDEWIRE ANGLED .035X260CM (WIRE) ×2 IMPLANT
KIT CAROTID MANIFOLD (MISCELLANEOUS) ×2 IMPLANT
PACK ANGIOGRAPHY (CUSTOM PROCEDURE TRAY) ×2 IMPLANT
SHEATH BRITE TIP 5FRX11 (SHEATH) ×2 IMPLANT
SYR MEDRAD MARK V 150ML (SYRINGE) ×2 IMPLANT
WIRE AQUATRACK .035X260CM (WIRE) ×2 IMPLANT
WIRE J 3MM .035X145CM (WIRE) ×2 IMPLANT

## 2018-03-31 NOTE — Progress Notes (Signed)
[facility] NAME: Sonya [name]    MR#:  [id number]  DATE OF BIRTH:  05/06/1932  SUBJECTIVE:  CHIEF COMPLAINT: Patient is doing okay. scheduled for carotid angiogram today.  Patient has chronic left-sided pain from the old history of stroke Son at bedside  REVIEW OF SYSTEMS:  CONSTITUTIONAL: No fever, fatigue or weakness.  EYES: No blurred or double vision.  EARS, NOSE, AND THROAT: No tinnitus or ear pain.  RESPIRATORY: No cough, shortness of breath, wheezing or hemoptysis.  CARDIOVASCULAR: No chest pain, orthopnea, edema.  GASTROINTESTINAL: No nausea, vomiting, diarrhea or abdominal pain.  GENITOURINARY: No dysuria, hematuria.  ENDOCRINE: No polyuria, nocturia,  HEMATOLOGY: No anemia, easy bruising or bleeding SKIN: No rash or lesion. MUSCULOSKELETAL: No joint pain or arthritis.   NEUROLOGIC: No tingling, numbness, weakness.  PSYCHIATRY: No anxiety or depression.   DRUG ALLERGIES:   Allergies  Allergen Reactions  . Clopidogrel Nausea And Vomiting     reported by [facility] 11/24/13  . Omeprazole Other (See Comments)    Unknown reaction - reported by [facility] 11/24/13    VITALS:  Blood pressure (!) 143/60, pulse 62, temperature (!) 97.4 F (36.3 C), temperature source Oral, resp. rate 16, height 5' (1.524 m), weight 50 kg, SpO2 97 %.  PHYSICAL EXAMINATION:  GENERAL:  85 y.o.-year-old patient lying in the bed with no acute distress.  EYES: Pupils equal, round, reactive to light and accommodation. No scleral icterus. Extraocular muscles intact.  HEENT: Head atraumatic, normocephalic. Oropharynx and nasopharynx clear.  NECK:  Supple, no jugular venous distention. No thyroid enlargement, no tenderness.  LUNGS: Normal breath sounds bilaterally, no wheezing, rales,rhonchi or crepitation. No use of accessory muscles of respiration.  CARDIOVASCULAR: S1, S2 normal. No murmurs, rubs,  or gallops.  ABDOMEN: Soft, nontender, nondistended. Bowel sounds present.  EXTREMITIES: No pedal edema, cyanosis, or clubbing.  NEUROLOGIC: Awake alert and oriented x3 sensation intact. Gait not checked.  PSYCHIATRIC: The patient is alert and oriented x 3.  SKIN: No obvious rash, lesion, or ulcer.    LABORATORY PANEL:   CBC Recent Labs  Lab 03/28/18 0418  WBC 15.3*  HGB 8.5*  HCT 26.8*  PLT 373   ------------------------------------------------------------------------------------------------------------------  Chemistries  Recent Labs  Lab 03/26/18 0950  03/31/18 0544  NA 139   < > 134*  K 2.6*   < > 4.5  CL 100   < > 104  CO2 29   < > 25  GLUCOSE 129*   < > 94  BUN 15   < > 16  CREATININE 1.08*   < > 0.70  CALCIUM 9.3   < > 8.6*  MG 1.8   < > 1.8  AST 20  --   --   ALT 15  --   --   ALKPHOS 56  --   --   BILITOT 0.8  --   --    < > = values in this interval not displayed.   ------------------------------------------------------------------------------------------------------------------  Cardiac Enzymes Recent Labs  Lab 03/26/18 0950  TROPONINI <0.03   ------------------------------------------------------------------------------------------------------------------  RADIOLOGY:  No results found.  EKG:   Orders placed or performed during the hospital encounter of 03/22/18  . ED EKG  . ED EKG    ASSESSMENT AND PLAN:   Syncope looks like vasovagal , high-grade stenosis of ICAs- Telemetry monitoring-no erythema he is noticed CT head no acute findings Carotid Dopplers-revealed high-grade stenosis CT  angiogram is done revealing 70% stenosis , vascular surgery consult placed as the patient is having recurrent syncopal episode-scheduled  for bilateral carotid angiogram on Wednesday, 03/31/2018 by Dr. Lucky Cowboy.  N.p.o. after midnight Holding aspirin and Eliquis home medications echocardiogram-60 to 65% ejection fraction cavity size was normal Cardiology consult  to Dr. Ubaldo Glassing Neurochecks PT evaluation-skilled nursing facility is recommended orthostatics are positive   Hypokalemia and generalized weakness From malnutrition-dietitian consult Normal magnesium Patient had significant and recurrent hypokalemia during last admission also. Repleted orally and start her on potassium rich diet Seen by nephrology Dr. Zollie Scale, giving IV potassium for better absorption PCP to consider checking potassium level during the follow-up visit Goal is to keep potassium greater than 4 and magnesium greater than 2  Severe malnutrition seen by dietitian dietary supplements as recommended by them   *Altered mental status-resolved This happened at home, patient is currently alert and oriented. UA is negative, chest x-ray is clear, influenza test is negative. Son denies any fever.  *Recent GI bleed Continue PPI twice daily and holding anticoagulation  as recommended by vascular surgery for bilateral carotid angiogram on Wednesday, 03/31/2018 by Dr. Lucky Cowboy  *Uncontrolled hypertension Continue amlodipine and clonidine, added metoprolol.  *Tachycardia Resolved after adding metoprolol  * hypothyroidism Continue levothyroxine.   Normal TSH.  *History of stroke Patient was on Eliquis Eliquis is on hold for procedure today  *Generalized weakness PT assessment pending   All the records are reviewed and case discussed with Care Management/Social Workerr. Management plans discussed with the patient, family and they are in agreement.  CODE STATUS: DNR  TOTAL TIME TAKING CARE OF THIS PATIENT: 36  minutes.   POSSIBLE D/C IN 1-2 DAYS, DEPENDING ON CLINICAL CONDITION.  Note: This dictation was prepared with Dragon dictation along with smaller phrase technology. Any transcriptional errors that result from this process are unintentional.   Nicholes Mango M.D on 03/31/2018 at 12:43 PM  Between 7am to 6pm - Pager - [phone] After 6pm go to [email or web address] -  password [email or web address] [facility]  Office  [phone]  CC: Primary care physician; Baxter Hire, MD

## 2018-03-31 NOTE — Progress Notes (Signed)
PHARMACY CONSULT NOTE - FOLLOW UP  Pharmacy Consult for Electrolyte Monitoring and Replacement   Recent Labs: Potassium (mmol/L)  Date Value  03/31/2018 4.5   Magnesium (mg/dL)  Date Value  03/31/2018 1.8   Calcium (mg/dL)  Date Value  03/31/2018 8.6 (L)   Albumin (g/dL)  Date Value  03/26/2018 3.8   Sodium (mmol/L)  Date Value  03/31/2018 134 (L)     Assessment: 85 year old female admitted with hypokalemia.   Goal of Therapy:  Potassium 3.5-4 Magnesium ~ 2  Plan:  Mg 1.8: no additional supplementation K 4.5: no additional supplementation  Will follow up with morning labs.  Paulina Fusi, PharmD, BCPS 03/31/2018 11:11 AM

## 2018-03-31 NOTE — Progress Notes (Signed)
Physical Therapy Treatment Patient Details Name: Sonya [name] MRN: [id number] DOB: 1932/07/24 Today's Date: 03/31/2018    History of Present Illness Patient is an 85 y/o female that presents with syncopal episode at home while on commode and was found by son. 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     PT Comments    Pt c/o general pain all over this am.  Had recently received pain medication.  Agrees to supine ex for pain relief as below.  Attempted to reposition per her request on right side but it was too painful and she opted to stay on her back.  Awaiting procedure this pm.  Will continue as appropriate.   Follow Up Recommendations  SNF     Equipment Recommendations       Recommendations for Other Services       Precautions / Restrictions Precautions Precautions: Fall Restrictions Weight Bearing Restrictions: No Other Position/Activity Restrictions: L sided weakness    Mobility  Bed Mobility               General bed mobility comments: refused due to pain  Transfers                    Ambulation/Gait                 Stairs             Wheelchair Mobility    Modified Rankin (Stroke Patients Only)       Balance                                            Cognition Arousal/Alertness: Awake/alert Behavior During Therapy: WFL for tasks assessed/performed Overall Cognitive Status: Within Functional Limits for tasks assessed                                        Exercises Other Exercises Other Exercises: supine AAROM for BLE ankle pumps, heel slides, ab/add x 10 limited by pain    General Comments        Pertinent Vitals/Pain Pain Assessment: 0-10 Pain Score: 8  Pain Location: back, BLE's L UE Pain Descriptors / Indicators: Aching Pain Intervention(s): Premedicated before session;Monitored during  session;Repositioned    Home Living                      Prior Function            PT Goals (current goals can now be found in the care plan section) Progress towards PT goals: Progressing toward goals    Frequency    Min 2X/week      PT Plan Current plan remains appropriate    Co-evaluation              AM-PAC PT "6 Clicks" Mobility   Outcome Measure  Help needed turning from your back to your side while in a flat bed without using bedrails?: A Lot Help needed moving from lying on your back to sitting on the side of a flat bed without using bedrails?: A Lot Help needed moving to and from a bed to a chair (including a wheelchair)?: Total Help needed standing up  from a chair using your arms (e.g., wheelchair or bedside chair)?: Total Help needed to walk in hospital room?: Total Help needed climbing 3-5 steps with a railing? : Total 6 Click Score: 8    End of Session Equipment Utilized During Treatment: Gait belt Activity Tolerance: Patient tolerated treatment well;Patient limited by fatigue Patient left: in bed;with call bell/phone within reach;with bed alarm set;with family/visitor present         Time: 1655-3748 PT Time Calculation (min) (ACUTE ONLY): 10 min  Charges:  $Gait Training: 8-22 mins                     Chesley Noon, PTA 03/31/18, 12:02 PM

## 2018-03-31 NOTE — Op Note (Signed)
[facility]   OPERATIVE NOTE  DATE: 03/31/2018  PRE-OPERATIVE DIAGNOSIS: 1. bilateral carotid artery stenosis 2. Bilateral vertebral artery stenosis 3. Left subclavian artery stenosis  POST-OPERATIVE DIAGNOSIS: Same as above  PROCEDURE: 1.   Ultrasound Guidance for vascular access right femoral artery 2.   Catheter placement into right common carotid artery and into left common carotid artery from right femoral approach 3.   Catheter placement into left subclavian artery and into the innominate artery for evaluation of the subclavian arteries and vertebral arteries 4.   Thoracic aortogram 5.   Cervical and cerebral bilateral carotid angiograms 6.   Bilateral subclavian and vertebral artery angiograms  7.   StarClose closure device right femoral artery  SURGEON: Leotis Pain, MD  ASSISTANT(S): None  ANESTHESIA: Moderate conscious sedation for approximately 40 minutes with 1 mg of Versed and 25 mcg of fentanyl  ESTIMATED BLOOD LOSS: 5 cc  FLUORO TIME: 9.3 minutes  CONTRAST: 80 cc  FINDING(S): 1.  Left subclavian artery with 70 to 80% stenosis.  Separate and distinct origin stenosis of the left vertebral artery in the 90 to 95% range which was then continuous distally to the brain. 2.  No significant innominate artery stenosis 3.  Right subclavian artery without significant stenosis but highly tortuous.  50 to 60% stenosis of the origin of the right vertebral artery which was then continuous distally but small 4.  Right cervical carotid artery with 40 to 50% stenosis although there was some mild ulceration.  Intracranial flow was brisk without focal filling defects in the anterior middle cerebral arteries 5.  Left cervical carotid artery with 85 to 90% stenosis.  External carotid artery was occluded.  Marked tortuosity just distal to the lesion with a 360 degree turn.  Intracranial flow was brisk without focal filling defects in the anterior or middle cerebral  arteries  SPECIMEN(S):  None  INDICATIONS:   Patient is a 85 y.o.female who presents with syncope and confusion.  She has a Scientist, product/process development of medical issues.  She had a CT angiogram of the brain which I had evaluated but was not entirely diagnostic due to the calcific lesions in the multiple areas being of concern.  She had bilateral vertebral artery disease as well as left subclavian artery disease.  She had bilateral carotid artery disease with the left side being worst. Catheter-based angiogram is performed for further evaluation. Risks and benefits are discussed and informed consent was obtained.  DESCRIPTION: After obtaining full informed written consent, the patient was brought back to the operating room and placed supine upon the vascular suite table.  After obtaining adequate anesthesia, the patient was prepped and draped in the standard fashion.  Moderate conscious sedation was administered during a face to face encounter with the patient throughout the procedure with my supervision of the RN administering medicines and monitoring the patients vital signs and mental status throughout from the start of the procedure until the patient was taken to the recovery room. The right femoral artery was visualized with ultrasound and found to be calcific but patent. It was then accessed under direct ultrasound guidance without difficulty with a Seldinger needle. A J-wire and 5 French sheath were placed and a permanent image was recorded. The patient was given 3000 units of intravenous heparin. A pigtail catheter was placed into the ascending aorta and an LAO projection thoracic aortogram was performed. 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  I then selected a JB2 catheter and selectively cannulated the left  subclavian artery with a mild amount of difficulty.  Selective imaging was then performed showing 70 to 80% stenosis which was highly calcific of the left subclavian artery proximal to the vertebral artery takeoff.  The vessel normalized for 1 to 2 cm prior to the vertebral artery takeoff where there was a critical stenosis of 90 to 95% of the origin of the left vertebral artery.  The vessel normalized and was then continuous distally following this.  I then selected a Bentson Hanafee catheter and cannulated the innominate artery advancing into the mid right common carotid artery.  The JB2 catheter would not cross and advanced due to tortuosity.  After the Bentson Hanafee catheter was placed in the mid right common carotid artery selective imaging was performed.  Cervical and cerebral carotid angiography were then performed on the right side initially. The intracranial flow was found to be brisk without obvious intracranial filling deficits.  There was not a lot of cross-filling right to left. The cervical carotid artery was mildly diseased in the 40 to 50% range although a small ulceration was present.  Attempts at advancing the [facility] catheter into the right subclavian artery to get selective imaging there were not fruitful due to marked tortuosity.  The catheter was pulled back to the innominate artery but beyond the takeoff of the left common carotid artery and several images were performed to evaluate the right subclavian and vertebral artery.  The right subclavian artery was not significantly stenotic but was very tortuous.  There was a moderate stenosis in the 50 to 60% range at the origin of the right vertebral artery.  The right vertebral artery was the smaller of the 2 vertebral arteries but was continuous distally.  The angulation of the left common carotid artery off of the innominate artery make cannulation difficult.  I exchanged for a Simmons 1 catheter and was able to tediously cannulate the  left common carotid artery and advanced into the proximal left common carotid artery for imaging distally. Selective imaging was then performed of the cervical and cerebral carotid artery on the left. Intracranial filling was simply good without obvious intracranial filling defects.  There was no left-to-right cross-filling. The cervical carotid artery was highly stenotic in the 85 to 90% range at what would be the bifurcation.  The external carotid artery was occluded with no flow.  There was a 360 degree tortuosity only a few centimeters above the lesion that would preclude safe filter placement. Multiple views were taken in the cervical carotid artery bilaterally. At this point, we had imaging to plan our treatment and we elected to terminate the procedure. The diagnostic catheter was removed. Oblique arteriogram was performed of the right femoral artery and StarClose closure device was deployed in usual fashion with excellent hemostatic result. The patient tolerated the procedure well and was taken to the recovery room in stable condition.  COMPLICATIONS: None  CONDITION: Stable   Leotis Pain 03/31/2018 3:52 PM   This note was created with Dragon Medical transcription system. Any errors in dictation are purely unintentional.

## 2018-03-31 NOTE — H&P (Signed)
[facility] History & Physical Update  The patient was interviewed and re-examined.  The patient's previous History and Physical has been reviewed and is unchanged.  There is no change in the plan of care. We plan to proceed with the scheduled procedure.  Leotis Pain, MD  03/31/2018, 2:46 PM

## 2018-04-01 ENCOUNTER — Encounter: Payer: Self-pay | Admitting: Vascular Surgery

## 2018-04-01 LAB — BASIC METABOLIC PANEL
Anion gap: 5 (ref 5–15)
BUN: 16 mg/dL (ref 8–23)
CHLORIDE: 105 mmol/L (ref 98–111)
CO2: 25 mmol/L (ref 22–32)
CREATININE: 0.76 mg/dL (ref 0.44–1.00)
Calcium: 8.4 mg/dL — ABNORMAL LOW (ref 8.9–10.3)
GFR calc Af Amer: >60 mL/min (ref >60)
GFR calc non Af Amer: >60 mL/min (ref >60)
GLUCOSE: 91 mg/dL (ref 70–99)
Potassium: 4.4 mmol/L (ref 3.5–5.1)
Sodium: 135 mmol/L (ref 135–145)

## 2018-04-01 LAB — CBC
HCT: 26.7 % — ABNORMAL LOW (ref 36.0–46.0)
HEMOGLOBIN: 8.2 g/dL — AB (ref 12.0–15.0)
MCH: 26.3 pg (ref 26.0–34.0)
MCHC: 30.7 g/dL (ref 30.0–36.0)
MCV: 85.6 fL (ref 80.0–100.0)
Platelets: 460 10*3/uL — ABNORMAL HIGH (ref 150–400)
RBC: 3.12 MIL/uL — ABNORMAL LOW (ref 3.87–5.11)
RDW: 16.2 % — ABNORMAL HIGH (ref 11.5–15.5)
WBC: 15.8 10*3/uL — ABNORMAL HIGH (ref 4.0–10.5)
nRBC: 0 % (ref 0.0–0.2)

## 2018-04-01 MED ORDER — LISINOPRIL 5 MG PO TABS: 5.0000 mg | ORAL_TABLET | Freq: Every day | ORAL | 0 refills | Status: DC

## 2018-04-01 MED ORDER — AMLODIPINE BESYLATE 5 MG PO TABS: 5.0000 mg | ORAL_TABLET | Freq: Every day | ORAL | 0 refills | Status: DC

## 2018-04-01 MED ORDER — MORPHINE SULFATE (PF) 2 MG/ML IV SOLN
2.0000 mg | Freq: Four times a day (QID) | INTRAVENOUS | Status: DC | PRN
  Administered 2018-04-01: 03:00:00 2 mg via INTRAVENOUS
  Filled 2018-04-01: qty 1

## 2018-04-01 MED ORDER — ENSURE ENLIVE PO LIQD: 237.0000 mL | Freq: Two times a day (BID) | ORAL | 12 refills | Status: DC

## 2018-04-01 MED ORDER — ASPIRIN EC 325 MG PO TBEC
325.0000 mg | DELAYED_RELEASE_TABLET | Freq: Every day | ORAL | Status: DC
  Administered 2018-04-01: 325 mg via ORAL
  Filled 2018-04-01: qty 1

## 2018-04-01 MED ORDER — APIXABAN 2.5 MG PO TABS: 2.5000 mg | ORAL_TABLET | Freq: Two times a day (BID) | ORAL | Status: DC

## 2018-04-01 MED ORDER — PRO-STAT SUGAR FREE PO LIQD: 30.0000 mL | Freq: Two times a day (BID) | ORAL | 0 refills | Status: DC

## 2018-04-01 MED ORDER — MUPIROCIN 2 % EX OINT: 1.0000 "application " | TOPICAL_OINTMENT | Freq: Two times a day (BID) | CUTANEOUS | 0 refills | Status: AC

## 2018-04-01 MED ORDER — ASPIRIN 325 MG PO TBEC: 325.0000 mg | DELAYED_RELEASE_TABLET | Freq: Every day | ORAL | 0 refills | Status: DC

## 2018-04-01 NOTE — Progress Notes (Signed)
[facility] Daily Progress Note   Subjective: 1 Day Post-Op:  1.   Ultrasound Guidance for vascular access right femoral artery 2.   Catheter placement into right common carotid artery and into left common carotid artery from right femoral approach 3.   Catheter placement into left subclavian artery and into the innominate artery for evaluation of the subclavian arteries and vertebral arteries 4.   Thoracic aortogram 5.   Cervical and cerebral bilateral carotid angiograms 6.   Bilateral subclavian and vertebral artery angiograms  7.   StarClose closure device right femoral artery  Patient complaining of some left arm pain this AM  Objective: Vitals:   03/31/18 1713 03/31/18 1956 04/01/18 0440 04/01/18 0811  BP: (!) 142/59 (!) 125/50 (!) 146/58 (!) 146/58  Pulse: 62 61 72 72  Resp: 18 17 18 16   Temp: (!) 97.4 F (36.3 C) 97.7 F (36.5 C) 97.9 F (36.6 C) 98.2 F (36.8 C)  TempSrc: Oral Oral Oral Oral  SpO2: 96% 97% 96% 95%  Weight:      Height:        Intake/Output Summary (Last 24 hours) at 04/01/2018 1214 Last data filed at 04/01/2018 0500 Gross per 24 hour  Intake 946.16 ml  Output 2350 ml  Net -1403.84 ml   Physical Exam: NAD CV: RRR Pulmonary: CTA Bilaterally Abdomen: Soft, Nontender, Nondistended Right Groin: Procedure dressing in place, clean and dry. No swelling. No drainage. Vascular: Warm, Non-tender, Mild edema   Laboratory: CBC    Component Value Date/Time   WBC 15.8 (H) 04/01/2018 0553   HGB 8.2 (L) 04/01/2018 0553   HCT 26.7 (L) 04/01/2018 0553   PLT 460 (H) 04/01/2018 0553   BMET    Component Value Date/Time   NA 135 04/01/2018 0553   K 4.4 04/01/2018 0553   CL 105 04/01/2018 0553   CO2 25 04/01/2018 0553   GLUCOSE 91 04/01/2018 0553   BUN 16 04/01/2018 0553   CREATININE 0.76 04/01/2018 0553   CALCIUM 8.4 (L) 04/01/2018 0553   GFRNONAA >60 04/01/2018 0553   GFRAA >60 04/01/2018 0553   Assessment/Planning: The  patient is an 85 year old female with severe bilateral carotid disease with multiple medical issues s/p carotid angiogram POD #1 1) Patient with multiple medical issues, high risk for surgery 2) Patient to follow up in three weeks in our office to see Dr. Lucky Cowboy to discuss possible options for repair  3) OK to discharge from vascular standpoint if medical cleared. 4) Vascular to sign off at this time. Please re-consult if needed.   Discussed with Dr. Ellis Parents  PA-C 04/01/2018 12:14 PM

## 2018-04-01 NOTE — Progress Notes (Signed)
PHARMACY CONSULT NOTE - FOLLOW UP  Pharmacy Consult for Electrolyte Monitoring and Replacement   Recent Labs: Potassium (mmol/L)  Date Value  04/01/2018 4.4   Magnesium (mg/dL)  Date Value  03/31/2018 1.8   Calcium (mg/dL)  Date Value  04/01/2018 8.4 (L)   Albumin (g/dL)  Date Value  03/26/2018 3.8   Sodium (mmol/L)  Date Value  04/01/2018 135     Assessment: 85 year old female admitted with hypokalemia.   Goal of Therapy:  Potassium 3.5-4 Magnesium ~ 2  Plan:  K 4.4: no additional supplementation  Will follow up with morning labs.  Paulina Fusi, PharmD, BCPS 04/01/2018 10:53 AM

## 2018-04-01 NOTE — Discharge Instructions (Signed)
Follow-up with primary care physician in 3 days Follow-up with cardiology Dr. Ubaldo Glassing in a week Follow-up with nephrology Dr. Holley Raring  in 2 weeks Follow-up with vascular surgery Dr. Lucky Cowboy in 3 weeks  Vascular Surgery Discharge Instructions 1) Remove right groin dressing tomorrow 2) You may shower as of Friday 3) Please keep groin clean and dry

## 2018-04-01 NOTE — Discharge Summary (Signed)
[facility] [place] NAME: Sonya [name]    MR#:  [id number]  DATE OF BIRTH:  10-16-1932  DATE OF ADMISSION:  03/26/2018 ADMITTING PHYSICIAN: Vaughan Basta, MD  DATE OF DISCHARGE:  04/01/18   PRIMARY CARE PHYSICIAN: Baxter Hire, MD    ADMISSION DIAGNOSIS:  Weakness [R53.1] Altered mental status, unspecified altered mental status type [R41.82]  DISCHARGE DIAGNOSIS:  Principal Problem:   Hypokalemia Active Problems:   Protein-calorie malnutrition, severe Recurrent syncope Bilateral carotid angiogram-needs carotid endarterectomy Generalized weakness SECONDARY DIAGNOSIS:   Past Medical History:  Diagnosis Date  . CAD (coronary artery disease)   . Carpal tunnel syndrome   . GERD (gastroesophageal reflux disease)   . History of shingles   . Hormone receptor positive breast cancer ([place])   . Hyperlipemia   . Hypertension   . Stroke [facility])     HOSPITAL COURSE:    Syncope looks like vasovagal , high-grade stenosis of ICAs- Telemetry monitoring-no arrhythmias noticed CT head no acute findings Carotid Dopplers-revealed high-grade stenosis CT angiogram is done revealing 70% stenosis , vascular surgery consult placed as the patient is having recurrent syncopal episode-scheduled  for bilateral carotid angiogram on Wednesday, 03/31/2018 by Dr. Lucky Cowboy. 03/31/2018 patient had bilateral carotid angiogram and Dr.Dew is recommending carotid endarterectomy, patient to follow-up with Dr. Lucky Cowboy as an outpatient in 3 to 4 weeks echocardiogram-60 to 65% ejection fraction cavity size was normal Cardiology consult to Dr. Ubaldo Glassing, agrees with carotid endarterectomy PT evaluation-skilled nursing facility is recommended.  Patient is refusing to go to SNF, patient prefers going home with home health, son is agreeable orthostatics are positive-patient denies any dizziness   Hypokalemia and generalized weakness From malnutrition-dietitian  consult Normal magnesium Patient had significant and recurrent hypokalemia during last admission also. Repleted orally and start her on potassium rich diet Goal is to keep potassium greater than 4 and magnesium greater than 2  Severe malnutrition seen by dietitian dietary supplements as recommended by them   *Altered mental status-resolved This happened at home, patient is currently alert and oriented. UA is negative, chest x-ray is clear, influenza test isnegative. Son denies any fever.  *Recent GI bleed Continue PPI twice daily  Resumed Eliquis  *Uncontrolled hypertension Better now Continue amlodipine and clonidine, addedmetoprolol.  *Tachycardia Resolved after adding metoprolol  *Leukocytosis secondary to inflammatory reaction from the procedure Patient is a febrile  * hypothyroidism Continue levothyroxine.Normal TSH.  *History of stroke Continue Eliquis and aspirin 325 mg given the ICA stenosis as recommended by vascular surgery  *Generalized weakness PT assessment-recommending SNF patient prefers going home with home health will discharge home with home health  DISCHARGE CONDITIONS:   Fair  CONSULTS OBTAINED:  Treatment Team:  Teodoro Spray, MD Anthonette Legato, MD Murray Hodgkins, MD   PROCEDURES bilateral carotid angiogram  DRUG ALLERGIES:   Allergies  Allergen Reactions  . Clopidogrel Nausea And Vomiting     reported by [place] 11/24/13  . Omeprazole Other (See Comments)    Unknown reaction - reported by [place] 11/24/13    DISCHARGE MEDICATIONS:   Allergies as of 04/01/2018      Reactions   Clopidogrel Nausea And Vomiting    reported by [place] 11/24/13   Omeprazole Other (See Comments)   Unknown reaction - reported by [place] 11/24/13      Medication List    TAKE these medications   amLODipine 5  MG tablet Commonly known as:   NORVASC Take 1 tablet (5 mg total) by mouth daily. Start taking on:  April 02, 2018 What changed:    medication strength  how much to take   apixaban 2.5 MG Tabs tablet Commonly known as:  ELIQUIS Take 1 tablet (2.5 mg total) by mouth 2 (two) times daily.   aspirin 325 MG EC tablet Take 1 tablet (325 mg total) by mouth daily. What changed:    medication strength  how much to take   b complex vitamins tablet Take 1 tablet by mouth daily.   B-complex with vitamin C tablet Take 1 tablet by mouth daily.   bisacodyl 5 MG EC tablet Commonly known as:  DUCODYL Take 2 tablets (10 mg total) by mouth daily.   cloNIDine 0.2 MG tablet Commonly known as:  CATAPRES Take 1 tablet (0.2 mg total) by mouth 2 (two) times daily. What changed:  how much to take   collagenase ointment Commonly known as:  SANTYL Apply topically daily.   feeding supplement (ENSURE ENLIVE) Liqd Take 237 mLs by mouth 2 (two) times daily between meals.   feeding supplement (PRO-STAT SUGAR FREE 64) Liqd Take 30 mLs by mouth 2 (two) times daily.   gabapentin 100 MG capsule Commonly known as:  NEURONTIN Take 100 mg by mouth 3 (three) times daily.   HYDROcodone-acetaminophen 5-325 MG tablet Commonly known as:  NORCO/VICODIN Take 1 tablet by mouth every 6 (six) hours as needed for moderate pain or severe pain.   lansoprazole 30 MG capsule Commonly known as:  PREVACID Take 1 capsule (30 mg total) by mouth daily.   levothyroxine 25 MCG tablet Commonly known as:  SYNTHROID, LEVOTHROID Take 25 mcg by mouth daily before breakfast.   lisinopril 5 MG tablet Commonly known as:  PRINIVIL,ZESTRIL Take 1 tablet (5 mg total) by mouth daily. Start taking on:  April 02, 2018   Melatonin 3 MG Tabs Take 3 mg by mouth at bedtime as needed (sleep).   metoprolol tartrate 25 MG tablet Commonly known as:  LOPRESSOR Take 1 tablet (25 mg total) by mouth 2 (two) times daily.   mupirocin ointment 2 % Commonly  known as:  BACTROBAN Place 1 application into the nose 2 (two) times daily for 3 days.   ondansetron 4 MG disintegrating tablet Commonly known as:  ZOFRAN-ODT Take 1 tablet (4 mg total) by mouth every 8 (eight) hours as needed for nausea or vomiting.   ondansetron 4 MG tablet Commonly known as:  ZOFRAN Take 4 mg by mouth every 8 (eight) hours as needed for nausea.   polyethylene glycol packet Commonly known as:  MIRALAX / GLYCOLAX Take 17 g by mouth daily as needed for moderate constipation.   potassium chloride SA 20 MEQ tablet Commonly known as:  K-DUR,KLOR-CON Take 20 mEq by mouth daily.   simvastatin 40 MG tablet Commonly known as:  ZOCOR Take 1 tablet (40 mg total) by mouth daily at 6 PM.   venlafaxine XR 150 MG 24 hr capsule Commonly known as:  EFFEXOR-XR Take 150 mg by mouth at bedtime.   Vitamin D3 25 MCG (1000 UT) Caps Take 3,000 Units by mouth at bedtime.        DISCHARGE INSTRUCTIONS:  Follow-up with primary care physician in 3 days Follow-up with cardiology Dr. Ubaldo Glassing in a week Follow-up with nephrology Dr. Holley Raring in 2 weeks Follow-up with vascular surgery Dr. Lucky Cowboy in 3 weeks   DIET:  Cardiac diet  DISCHARGE CONDITION:  Stable  ACTIVITY:  Activity as tolerated per PT  OXYGEN:  Home Oxygen: No.   Oxygen Delivery: room air  DISCHARGE LOCATION:  home   If you experience worsening of your admission symptoms, develop shortness of breath, life threatening emergency, suicidal or homicidal thoughts you must seek medical attention immediately by calling 911 or calling your MD immediately  if symptoms less severe.  You Must read complete instructions/literature along with all the possible adverse reactions/side effects for all the Medicines you take and that have been prescribed to you. Take any new Medicines after you have completely understood and accpet all the possible adverse reactions/side effects.   Please note  You were cared for by a hospitalist  during your hospital stay. If you have any questions about your discharge medications or the care you received while you were in the hospital after you are discharged, you can call the unit and asked to speak with the hospitalist on call if the hospitalist that took care of you is not available. Once you are discharged, your primary care physician will handle any further medical issues. Please note that NO REFILLS for any discharge medications will be authorized once you are discharged, as it is imperative that you return to your primary care physician (or establish a relationship with a primary care physician if you do not have one) for your aftercare needs so that they can reassess your need for medications and monitor your lab values.     Today  Chief Complaint  Patient presents with  . Altered Mental Status   Patient is feeling fine.  Denies any complaints or dizziness today.  Okay to discharge patient from cardiology, vascular surgery and nephrology standpoint.  Patient is refusing to go to rehab center , patient and son are agreeable with home with home health  ROS:  CONSTITUTIONAL: Denies fevers, chills. Denies any fatigue, weakness.  EYES: Denies blurry vision, double vision, eye pain. EARS, NOSE, THROAT: Denies tinnitus, ear pain, hearing loss. RESPIRATORY: Denies cough, wheeze, shortness of breath.  CARDIOVASCULAR: Denies chest pain, palpitations, edema.  GASTROINTESTINAL: Denies nausea, vomiting, diarrhea, abdominal pain. Denies bright red blood per rectum. GENITOURINARY: Denies dysuria, hematuria. ENDOCRINE: Denies nocturia or thyroid problems. HEMATOLOGIC AND LYMPHATIC: Denies easy bruising or bleeding. SKIN: Denies rash or lesion. MUSCULOSKELETAL: Reports chronic left-sided pain from previous stroke  nEUROLOGIC: Denies paralysis, paresthesias.  PSYCHIATRIC: Denies anxiety or depressive symptoms.   VITAL SIGNS:  Blood pressure (!) 146/58, pulse 72, temperature 98.2 F (36.8  C), temperature source Oral, resp. rate 16, height 5' (1.524 m), weight 50 kg, SpO2 95 %.  I/O:    Intake/Output Summary (Last 24 hours) at 04/01/2018 1121 Last data filed at 04/01/2018 0500 Gross per 24 hour  Intake 946.16 ml  Output 2350 ml  Net -1403.84 ml    PHYSICAL EXAMINATION:  GENERAL:  85 y.o.-year-old patient lying in the bed with no acute distress.  EYES: Pupils equal, round, reactive to light and accommodation. No scleral icterus. Extraocular muscles intact.  HEENT: Head atraumatic, normocephalic. Oropharynx and nasopharynx clear.  NECK:  Supple, no jugular venous distention. No thyroid enlargement, no tenderness.  LUNGS: Normal breath sounds bilaterally, no wheezing, rales,rhonchi or crepitation. No use of accessory muscles of respiration.  CARDIOVASCULAR: S1, S2 normal. No murmurs, rubs, or gallops.  ABDOMEN: Soft, non-tender, non-distended. Bowel sounds present.  EXTREMITIES: No pedal edema, cyanosis, or clubbing.  NEUROLOGIC: Awake, alert and oriented x3. Sensation intact. Gait not checked.  PSYCHIATRIC: The patient is alert  and oriented x 3.  SKIN: No obvious rash, lesion, or ulcer.   DATA REVIEW:   CBC Recent Labs  Lab 04/01/18 0553  WBC 15.8*  HGB 8.2*  HCT 26.7*  PLT 460*    Chemistries  Recent Labs  Lab 03/26/18 0950  03/31/18 0544 04/01/18 0553  NA 139   < > 134* 135  K 2.6*   < > 4.5 4.4  CL 100   < > 104 105  CO2 29   < > 25 25  GLUCOSE 129*   < > 94 91  BUN 15   < > 16 16  CREATININE 1.08*   < > 0.70 0.76  CALCIUM 9.3   < > 8.6* 8.4*  MG 1.8   < > 1.8  --   AST 20  --   --   --   ALT 15  --   --   --   ALKPHOS 56  --   --   --   BILITOT 0.8  --   --   --    < > = values in this interval not displayed.    Cardiac Enzymes Recent Labs  Lab 03/26/18 0950  TROPONINI <0.03    Microbiology Results  Results for orders placed or performed during the hospital encounter of 03/26/18  MRSA PCR Screening     Status: Abnormal   Collection  Time: 03/27/18  2:49 AM  Result Value Ref Range Status   MRSA by PCR POSITIVE (A) NEGATIVE Final    Comment:        The GeneXpert MRSA Assay (FDA approved for NASAL specimens only), is one component of a comprehensive MRSA colonization surveillance program. It is not intended to diagnose MRSA infection nor to guide or monitor treatment for MRSA infections. RESULT CALLED TO, READ BACK BY AND VERIFIED WITH: TEMEKA COLES ON 03/27/2018 AT 0423 QSD Performed at [facility], [address] [place], [place] [address]     RADIOLOGY:  No results found.  EKG:   Orders placed or performed during the hospital encounter of 03/22/18  . ED EKG  . ED EKG      Management plans discussed with the patient, family and they are in agreement.  CODE STATUS:     Code Status Orders  (From admission, onward)         Start     Ordered   03/26/18 1753  Do not attempt resuscitation (DNR)  Continuous    Question Answer Comment  In the event of cardiac or respiratory ARREST Do not call a "code blue"   In the event of cardiac or respiratory ARREST Do not perform Intubation, CPR, defibrillation or ACLS   In the event of cardiac or respiratory ARREST Use medication by any route, position, wound care, and other measures to relive pain and suffering. May use oxygen, suction and manual treatment of airway obstruction as needed for comfort.      03/26/18 1752        Code Status History    Date Active Date Inactive Code Status Order ID Comments User Context   03/22/2018 1536 03/24/2018 1438 Full Code [id number]  Fritzi Mandes, MD Inpatient   02/21/2018 1200 02/24/2018 1921 Full Code [id number]  Hillary Bow, MD ED   11/06/2017 0040 11/16/2017 2146 DNR [id number]  Lance Coon, MD Inpatient   10/30/2017 1648 11/03/2017 1703 DNR [id number]  Dustin Flock, MD Inpatient   10/12/2017 2234 10/15/2017 2130 Full Code [id number]  [place],  Ilene Qua, MD ED      TOTAL TIME TAKING CARE OF THIS PATIENT: 42 minutes.    Note: This dictation was prepared with Dragon dictation along with smaller phrase technology. Any transcriptional errors that result from this process are unintentional.   @[facility] @  on 04/01/2018 at 11:21 AM  Between 7am to 6pm - Pager - [phone]  After 6pm go to [email or web address] - password [email or web address] [facility]  Office  [phone]  CC: Primary care physician; Baxter Hire, MD

## 2018-04-01 NOTE — Progress Notes (Signed)
ANTICOAGULATION CONSULT NOTE - Initial Consult  Pharmacy Consult for Restarting Apixaban post angriogram Indication: stroke  Allergies  Allergen Reactions  . Clopidogrel Nausea And Vomiting     reported by [place] 11/24/13  . Omeprazole Other (See Comments)    Unknown reaction - reported by [place] 11/24/13    Patient Measurements: Height: 5' (152.4 cm) Weight: 110 lb 3.7 oz (50 kg) IBW/kg (Calculated) : 45.5 Heparin Dosing Weight:    Vital Signs: Temp: 98.2 F (36.8 C) (02/13 0811) Temp Source: Oral (02/13 0811) BP: 146/58 (02/13 0811) Pulse Rate: 72 (02/13 0811)  Labs: Recent Labs    03/30/18 0441 03/31/18 0544 04/01/18 0553  HGB  --   --  8.2*  HCT  --   --  26.7*  PLT  --   --  460*  CREATININE 0.77 0.70 0.76    Estimated Creatinine Clearance: 38.3 mL/min (by C-G formula based on SCr of 0.76 mg/dL).   Medical History: Past Medical History:  Diagnosis Date  . CAD (coronary artery disease)   . Carpal tunnel syndrome   . GERD (gastroesophageal reflux disease)   . History of shingles   . Hormone receptor positive breast cancer ([place])   . Hyperlipemia   . Hypertension   . Stroke [facility])    Assessment: Patient is a 85yo female that is s/p vascular intervention of carotids. Patient was taking apixaban 2.5mg  bid prior to admission and this was held for procedure.   Plan:  Will resume home dose of Apixaban 2.5mg  PO BID with this evenings dose.  Sonya [name], PharmD, BCPS 04/01/2018 10:50 AM

## 2018-04-01 NOTE — Care Management Important Message (Signed)
Important Message  Patient Details  Name: Sonya [name] MRN: [id number] Date of Birth: 1932/12/21   Medicare Important Message Given:  Yes    Shelbie Ammons, RN 04/01/2018, 10:55 AM

## 2018-04-10 ENCOUNTER — Other Ambulatory Visit: Payer: Self-pay

## 2018-04-10 ENCOUNTER — Encounter: Payer: Self-pay | Admitting: *Deleted

## 2018-04-10 ENCOUNTER — Emergency Department: Payer: Medicare Other

## 2018-04-10 ENCOUNTER — Inpatient Hospital Stay
Admission: EM | Admit: 2018-04-10 | Discharge: 2018-04-12 | DRG: 312 | Disposition: A | Discharge status: Home Health | Payer: Medicare Other | Attending: Internal Medicine | Admitting: Internal Medicine

## 2018-04-10 DIAGNOSIS — Z8041 Family history of malignant neoplasm of ovary: Secondary | ICD-10-CM | POA: Diagnosis not present

## 2018-04-10 DIAGNOSIS — N179 Acute kidney failure, unspecified: Secondary | ICD-10-CM

## 2018-04-10 DIAGNOSIS — Z8249 Family history of ischemic heart disease and other diseases of the circulatory system: Secondary | ICD-10-CM

## 2018-04-10 DIAGNOSIS — Z79899 Other long term (current) drug therapy: Secondary | ICD-10-CM

## 2018-04-10 DIAGNOSIS — K921 Melena: Secondary | ICD-10-CM

## 2018-04-10 DIAGNOSIS — I10 Essential (primary) hypertension: Secondary | ICD-10-CM | POA: Diagnosis not present

## 2018-04-10 DIAGNOSIS — I2510 Atherosclerotic heart disease of native coronary artery without angina pectoris: Secondary | ICD-10-CM | POA: Diagnosis present

## 2018-04-10 DIAGNOSIS — Z87891 Personal history of nicotine dependence: Secondary | ICD-10-CM | POA: Diagnosis not present

## 2018-04-10 DIAGNOSIS — Z79890 Hormone replacement therapy (postmenopausal): Secondary | ICD-10-CM | POA: Diagnosis not present

## 2018-04-10 DIAGNOSIS — I69354 Hemiplegia and hemiparesis following cerebral infarction affecting left non-dominant side: Secondary | ICD-10-CM | POA: Diagnosis not present

## 2018-04-10 DIAGNOSIS — Z7401 Bed confinement status: Secondary | ICD-10-CM

## 2018-04-10 DIAGNOSIS — Z7982 Long term (current) use of aspirin: Secondary | ICD-10-CM | POA: Diagnosis not present

## 2018-04-10 DIAGNOSIS — E785 Hyperlipidemia, unspecified: Secondary | ICD-10-CM | POA: Diagnosis not present

## 2018-04-10 DIAGNOSIS — Z7901 Long term (current) use of anticoagulants: Secondary | ICD-10-CM

## 2018-04-10 DIAGNOSIS — K219 Gastro-esophageal reflux disease without esophagitis: Secondary | ICD-10-CM | POA: Diagnosis present

## 2018-04-10 DIAGNOSIS — E039 Hypothyroidism, unspecified: Secondary | ICD-10-CM | POA: Diagnosis not present

## 2018-04-10 DIAGNOSIS — Z853 Personal history of malignant neoplasm of breast: Secondary | ICD-10-CM | POA: Diagnosis not present

## 2018-04-10 DIAGNOSIS — Z888 Allergy status to other drugs, medicaments and biological substances status: Secondary | ICD-10-CM

## 2018-04-10 DIAGNOSIS — I951 Orthostatic hypotension: Principal | ICD-10-CM | POA: Diagnosis present

## 2018-04-10 DIAGNOSIS — D649 Anemia, unspecified: Secondary | ICD-10-CM | POA: Diagnosis present

## 2018-04-10 DIAGNOSIS — Z823 Family history of stroke: Secondary | ICD-10-CM | POA: Diagnosis not present

## 2018-04-10 DIAGNOSIS — R4182 Altered mental status, unspecified: Secondary | ICD-10-CM | POA: Diagnosis present

## 2018-04-10 DIAGNOSIS — Z66 Do not resuscitate: Secondary | ICD-10-CM | POA: Diagnosis not present

## 2018-04-10 DIAGNOSIS — I959 Hypotension, unspecified: Secondary | ICD-10-CM | POA: Diagnosis present

## 2018-04-10 DIAGNOSIS — D72829 Elevated white blood cell count, unspecified: Secondary | ICD-10-CM | POA: Diagnosis present

## 2018-04-10 LAB — BASIC METABOLIC PANEL
Anion gap: 8 (ref 5–15)
BUN: 31 mg/dL — AB (ref 8–23)
CO2: 24 mmol/L (ref 22–32)
Calcium: 9.1 mg/dL (ref 8.9–10.3)
Chloride: 103 mmol/L (ref 98–111)
Creatinine, Ser: 1.59 mg/dL — ABNORMAL HIGH (ref 0.44–1.00)
GFR calc Af Amer: 34 mL/min — ABNORMAL LOW (ref >60)
GFR calc non Af Amer: 30 mL/min — ABNORMAL LOW (ref >60)
Glucose, Bld: 160 mg/dL — ABNORMAL HIGH (ref 70–99)
Potassium: 4.8 mmol/L (ref 3.5–5.1)
Sodium: 135 mmol/L (ref 135–145)

## 2018-04-10 LAB — URINALYSIS, COMPLETE (UACMP) WITH MICROSCOPIC
BACTERIA UA: NONE SEEN
BILIRUBIN URINE: NEGATIVE
Glucose, UA: NEGATIVE mg/dL
Hgb urine dipstick: NEGATIVE
KETONES UR: NEGATIVE mg/dL
Leukocytes,Ua: NEGATIVE
Nitrite: NEGATIVE
Protein, ur: NEGATIVE mg/dL
Specific Gravity, Urine: 1.013 (ref 1.005–1.030)
Squamous Epithelial / HPF: NONE SEEN (ref 0–5)
pH: 5 (ref 5.0–8.0)

## 2018-04-10 LAB — CBC
HEMATOCRIT: 32.7 % — AB (ref 36.0–46.0)
Hemoglobin: 9.8 g/dL — ABNORMAL LOW (ref 12.0–15.0)
MCH: 26 pg (ref 26.0–34.0)
MCHC: 30 g/dL (ref 30.0–36.0)
MCV: 86.7 fL (ref 80.0–100.0)
Platelets: 455 10*3/uL — ABNORMAL HIGH (ref 150–400)
RBC: 3.77 MIL/uL — ABNORMAL LOW (ref 3.87–5.11)
RDW: 15.9 % — ABNORMAL HIGH (ref 11.5–15.5)
WBC: 16.8 10*3/uL — ABNORMAL HIGH (ref 4.0–10.5)
nRBC: 0 % (ref 0.0–0.2)

## 2018-04-10 LAB — TROPONIN I
Troponin I: <0.03 ng/mL (ref <0.03)
Troponin I: <0.03 ng/mL (ref <0.03)

## 2018-04-10 MED ORDER — METOPROLOL TARTRATE 25 MG PO TABS
25.0000 mg | ORAL_TABLET | Freq: Two times a day (BID) | ORAL | Status: DC
  Administered 2018-04-10 – 2018-04-12 (×5): 25 mg via ORAL
  Filled 2018-04-10 (×5): qty 1

## 2018-04-10 MED ORDER — APIXABAN 2.5 MG PO TABS
2.5000 mg | ORAL_TABLET | Freq: Two times a day (BID) | ORAL | Status: DC
  Administered 2018-04-10 – 2018-04-12 (×5): 2.5 mg via ORAL
  Filled 2018-04-10 (×6): qty 1

## 2018-04-10 MED ORDER — GABAPENTIN 100 MG PO CAPS
100.0000 mg | ORAL_CAPSULE | Freq: Three times a day (TID) | ORAL | Status: DC
  Administered 2018-04-10 – 2018-04-12 (×7): 100 mg via ORAL
  Filled 2018-04-10 (×7): qty 1

## 2018-04-10 MED ORDER — MIDODRINE HCL 5 MG PO TABS
2.5000 mg | ORAL_TABLET | Freq: Two times a day (BID) | ORAL | Status: DC
  Administered 2018-04-10 – 2018-04-12 (×4): 2.5 mg via ORAL
  Filled 2018-04-10 (×4): qty 1

## 2018-04-10 MED ORDER — ENSURE ENLIVE PO LIQD
237.0000 mL | Freq: Two times a day (BID) | ORAL | Status: DC
  Administered 2018-04-11 (×2): 237 mL via ORAL

## 2018-04-10 MED ORDER — ONDANSETRON HCL 4 MG/2ML IJ SOLN
4.0000 mg | Freq: Once | INTRAMUSCULAR | Status: AC
  Administered 2018-04-10: 4 mg via INTRAVENOUS

## 2018-04-10 MED ORDER — ACETAMINOPHEN 650 MG RE SUPP: 650.0000 mg | Freq: Four times a day (QID) | RECTAL | Status: DC | PRN

## 2018-04-10 MED ORDER — POLYETHYLENE GLYCOL 3350 17 G PO PACK: 17.0000 g | PACK | Freq: Every day | ORAL | Status: DC | PRN

## 2018-04-10 MED ORDER — ONDANSETRON HCL 4 MG PO TABS: 4.0000 mg | ORAL_TABLET | Freq: Four times a day (QID) | ORAL | Status: DC | PRN

## 2018-04-10 MED ORDER — PRO-STAT SUGAR FREE PO LIQD
30.0000 mL | Freq: Two times a day (BID) | ORAL | Status: DC
  Administered 2018-04-10 – 2018-04-11 (×2): 30 mL via ORAL

## 2018-04-10 MED ORDER — SIMVASTATIN 20 MG PO TABS
40.0000 mg | ORAL_TABLET | Freq: Every day | ORAL | Status: DC
  Administered 2018-04-10 – 2018-04-11 (×2): 40 mg via ORAL
  Filled 2018-04-10 (×2): qty 2

## 2018-04-10 MED ORDER — MELATONIN 5 MG PO TABS
2.5000 mg | ORAL_TABLET | Freq: Every evening | ORAL | Status: DC | PRN
  Filled 2018-04-10: qty 0.5

## 2018-04-10 MED ORDER — HYDROCODONE-ACETAMINOPHEN 5-325 MG PO TABS
1.0000 | ORAL_TABLET | Freq: Four times a day (QID) | ORAL | Status: DC | PRN
  Administered 2018-04-11: 1 via ORAL
  Filled 2018-04-10: qty 1

## 2018-04-10 MED ORDER — BISACODYL 5 MG PO TBEC
10.0000 mg | DELAYED_RELEASE_TABLET | Freq: Every day | ORAL | Status: DC
  Administered 2018-04-10 – 2018-04-12 (×3): 10 mg via ORAL
  Filled 2018-04-10 (×3): qty 2

## 2018-04-10 MED ORDER — ONDANSETRON HCL 4 MG/2ML IJ SOLN: 4.0000 mg | Freq: Four times a day (QID) | INTRAMUSCULAR | Status: DC | PRN

## 2018-04-10 MED ORDER — PANTOPRAZOLE SODIUM 20 MG PO TBEC
20.0000 mg | DELAYED_RELEASE_TABLET | Freq: Two times a day (BID) | ORAL | Status: DC
  Administered 2018-04-10 – 2018-04-12 (×4): 20 mg via ORAL
  Filled 2018-04-10 (×5): qty 1

## 2018-04-10 MED ORDER — SODIUM CHLORIDE 0.9 % IV BOLUS
1000.0000 mL | Freq: Once | INTRAVENOUS | Status: AC
  Administered 2018-04-10: 1000 mL via INTRAVENOUS

## 2018-04-10 MED ORDER — VENLAFAXINE HCL ER 150 MG PO CP24
150.0000 mg | ORAL_CAPSULE | Freq: Every day | ORAL | Status: DC
  Administered 2018-04-10 – 2018-04-11 (×2): 150 mg via ORAL
  Filled 2018-04-10: qty 1
  Filled 2018-04-10: qty 2
  Filled 2018-04-10 (×2): qty 1
  Filled 2018-04-10: qty 2

## 2018-04-10 MED ORDER — ACETAMINOPHEN 325 MG PO TABS: 650.0000 mg | ORAL_TABLET | Freq: Four times a day (QID) | ORAL | Status: DC | PRN

## 2018-04-10 MED ORDER — MAGNESIUM OXIDE 400 (241.3 MG) MG PO TABS
400.0000 mg | ORAL_TABLET | Freq: Every day | ORAL | Status: DC
  Administered 2018-04-10 – 2018-04-12 (×3): 400 mg via ORAL
  Filled 2018-04-10 (×3): qty 1

## 2018-04-10 MED ORDER — ONDANSETRON HCL 4 MG/2ML IJ SOLN
INTRAMUSCULAR | Status: AC
  Administered 2018-04-10: 4 mg via INTRAVENOUS
  Filled 2018-04-10: qty 2

## 2018-04-10 MED ORDER — SODIUM CHLORIDE 0.9 % IV SOLN
INTRAVENOUS | Status: AC
  Administered 2018-04-10 – 2018-04-11 (×2): via INTRAVENOUS

## 2018-04-10 MED ORDER — B COMPLEX-C PO TABS
1.0000 | ORAL_TABLET | Freq: Every day | ORAL | Status: DC
  Administered 2018-04-10 – 2018-04-12 (×3): 1 via ORAL
  Filled 2018-04-10 (×3): qty 1

## 2018-04-10 MED ORDER — AMLODIPINE BESYLATE 5 MG PO TABS
5.0000 mg | ORAL_TABLET | Freq: Every day | ORAL | Status: DC
  Administered 2018-04-10 – 2018-04-12 (×3): 5 mg via ORAL
  Filled 2018-04-10 (×3): qty 1

## 2018-04-10 MED ORDER — LEVOTHYROXINE SODIUM 25 MCG PO TABS
25.0000 ug | ORAL_TABLET | Freq: Every day | ORAL | Status: DC
  Administered 2018-04-11 – 2018-04-12 (×2): 25 ug via ORAL
  Filled 2018-04-10 (×2): qty 1

## 2018-04-10 NOTE — ED Notes (Signed)
Pt placed on bed pan attempting to urinate.

## 2018-04-10 NOTE — ED Notes (Signed)
ED TO INPATIENT HANDOFF REPORT  ED Nurse Name and Phone #:  Levada Dy Reedley Name/Age/Gender Sonya [name] 85 y.o. female Room/Bed: ED06A/ED06A  Code Status   Code Status: Prior  Home/SNF/Other Home Patient oriented to: self, place, time and situation Is this baseline? Yes   Triage Complete: Triage complete  Chief Complaint ams  Triage Note Pt to ED with EMS after family found pt unresponsive. Son was with pt while she was sitting in the house and EMS states her BP was low at 72/35 and pt was hard to arouse. After laying pt on the stretcher she became more responsive and BP increased to 132/50. Son verbalized to EMS that pt has had a hx of BP dropping.  Left sided deficits after a stroke. Pts husband died yesterday.      Allergies Allergies  Allergen Reactions  . Clopidogrel Nausea And Vomiting     reported by Avis 11/24/13  . Omeprazole Other (See Comments)    Unknown reaction - reported by [place] 11/24/13    Level of Care/Admitting Diagnosis ED Disposition    ED Disposition Condition [place]: [place] [100120]  Level of Care: Med-Surg [16]  Diagnosis: Hypotension [122482]  Admitting Physician: Gladstone Lighter [[id number]]  Attending Physician: Gladstone Lighter [[id number]]  Estimated length of stay: past midnight tomorrow  Certification:: I certify this patient will need inpatient services for at least 2 midnights  PT Class (Do Not Modify): Inpatient [101]  PT Acc Code (Do Not Modify): Private [1]       B Medical/Surgery History Past Medical History:  Diagnosis Date  . CAD (coronary artery disease)   . Carpal tunnel syndrome   . GERD (gastroesophageal reflux disease)   . History of shingles   . Hormone receptor positive breast cancer ([place])   . Hyperlipemia   . Hypertension   . Stroke [facility])    left sided weakness   Past Surgical History:  Procedure  Laterality Date  . ABDOMINAL HYSTERECTOMY    . APPENDECTOMY    . CAROTID ANGIOGRAPHY Bilateral 03/31/2018   Procedure: CAROTID ANGIOGRAPHY;  Surgeon: Algernon Huxley, MD;  Location: [place] CV LAB;  Service: Cardiovascular;  Laterality: Bilateral;  . cornoary angioplasty    . ENDOSCOPIC RETROGRADE CHOLANGIOPANCREATOGRAPHY (ERCP) WITH PROPOFOL N/A 11/10/2017   Procedure: ENDOSCOPIC RETROGRADE CHOLANGIOPANCREATOGRAPHY (ERCP) WITH PROPOFOL;  Surgeon: Lucilla Lame, MD;  Location: [facility] ENDOSCOPY;  Service: Endoscopy;  Laterality: N/A;  . ESOPHAGOGASTRODUODENOSCOPY N/A 03/23/2018   Procedure: ESOPHAGOGASTRODUODENOSCOPY (EGD);  Surgeon: Lin Landsman, MD;  Location: [facility] ENDOSCOPY;  Service: Gastroenterology;  Laterality: N/A;  . ESOPHAGOGASTRODUODENOSCOPY (EGD) WITH PROPOFOL N/A 11/06/2017   Procedure: ESOPHAGOGASTRODUODENOSCOPY (EGD) WITH PROPOFOL;  Surgeon: Lucilla Lame, MD;  Location: [facility] ENDOSCOPY;  Service: Endoscopy;  Laterality: N/A;     A IV Location/Drains/Wounds Patient Lines/Drains/Airways Status   Active Line/Drains/Airways    Name:   Placement date:   Placement time:   Site:   Days:   Peripheral IV 04/10/18 Right Hand   04/10/18    0318    Hand   less than 1   Pressure Injury 02/21/18 Stage I -  Intact skin with non-blanchable redness of a localized area usually over a bony prominence.   02/21/18    1552     48   Wound / Incision (Open or Dehisced) 03/22/18 Other (Comment) Buttocks Left;Medial small open shallow wound   03/22/18    1600  Buttocks   19   Wound / Incision (Open or Dehisced) 03/22/18 Other (Comment) Elbow Left   03/22/18    1600    Elbow   19   Wound / Incision (Open or Dehisced) 03/22/18 Other (Comment) Ankle Left   03/22/18    1600    Ankle   19   Wound / Incision (Open or Dehisced) 03/22/18 Other (Comment) Toe (Comment  which one) Left   03/22/18    1600    Toe (Comment  which one)   19          Intake/Output Last 24 hours No intake or output data in the  24 hours ending 04/10/18 0926  Labs/Imaging Results for orders placed or performed during the hospital encounter of 04/10/18 (from the past 48 hour(s))  Basic metabolic panel     Status: Abnormal   Collection Time: 04/10/18  3:18 AM  Result Value Ref Range   Sodium 135 135 - 145 mmol/L   Potassium 4.8 3.5 - 5.1 mmol/L   Chloride 103 98 - 111 mmol/L   CO2 24 22 - 32 mmol/L   Glucose, Bld 160 (H) 70 - 99 mg/dL   BUN 31 (H) 8 - 23 mg/dL   Creatinine, Ser 1.59 (H) 0.44 - 1.00 mg/dL   Calcium 9.1 8.9 - 10.3 mg/dL   GFR calc non Af Amer 30 (L) >60 mL/min   GFR calc Af Amer 34 (L) >60 mL/min   Anion gap 8 5 - 15    Comment: Performed at [facility], [address] [place], [place] [address]  CBC     Status: Abnormal   Collection Time: 04/10/18  3:18 AM  Result Value Ref Range   WBC 16.8 (H) 4.0 - 10.5 K/uL   RBC 3.77 (L) 3.87 - 5.11 MIL/uL   Hemoglobin 9.8 (L) 12.0 - 15.0 g/dL   HCT 32.7 (L) 36.0 - 46.0 %   MCV 86.7 80.0 - 100.0 fL   MCH 26.0 26.0 - 34.0 pg   MCHC 30.0 30.0 - 36.0 g/dL   RDW 15.9 (H) 11.5 - 15.5 %   Platelets 455 (H) 150 - 400 K/uL   nRBC 0.0 0.0 - 0.2 %    Comment: Performed at [facility], [address] [place], [place] [address]  Troponin I - Once     Status: None   Collection Time: 04/10/18  3:18 AM  Result Value Ref Range   Troponin I <0.03 <0.03 ng/mL    Comment: Performed at [facility], [address] [place], [place] [address]  Urinalysis, Complete w Microscopic     Status: Abnormal   Collection Time: 04/10/18  4:45 AM  Result Value Ref Range   Color, Urine AMBER (A) YELLOW    Comment: BIOCHEMICALS MAY BE AFFECTED BY COLOR   APPearance HAZY (A) CLEAR   Specific Gravity, Urine 1.013 1.005 - 1.030   pH 5.0 5.0 - 8.0   Glucose, UA NEGATIVE NEGATIVE mg/dL   Hgb urine dipstick NEGATIVE NEGATIVE   Bilirubin Urine NEGATIVE NEGATIVE   Ketones, ur NEGATIVE NEGATIVE mg/dL   Protein, ur NEGATIVE NEGATIVE mg/dL   Nitrite  NEGATIVE NEGATIVE   Leukocytes,Ua NEGATIVE NEGATIVE   RBC / HPF 0-5 0 - 5 RBC/hpf   WBC, UA 0-5 0 - 5 WBC/hpf   Bacteria, UA NONE SEEN NONE SEEN   Squamous Epithelial / LPF NONE SEEN 0 - 5   Mucus PRESENT    Hyaline Casts,  UA PRESENT     Comment: Performed at [facility], [address] [place], [place] [address]  Troponin I - ONCE - STAT     Status: None   Collection Time: 04/10/18  7:10 AM  Result Value Ref Range   Troponin I <0.03 <0.03 ng/mL    Comment: Performed at [facility], [place], [place] [address]   Ct Head Wo Contrast  Result Date: 04/10/2018 CLINICAL DATA:  85 y/o F; found unresponsive. Left-sided deficits after a stroke. EXAM: CT HEAD WITHOUT CONTRAST TECHNIQUE: Contiguous axial images were obtained from the base of the skull through the vertex without intravenous contrast. COMPARISON:  03/27/2018, 02/21/2018 CT head. 03/27/2018 ultrasound head. FINDINGS: Brain: Stable right frontoparietal, insula, and thalamus chronic infarction. Stable white matter hypodensities compatible with chronic microvascular ischemic changes. Stable volume loss of the brain. No new acute stroke, hemorrhage, mass effect, extra-axial collection, hydrocephalus, or herniation. Vascular: Calcific atherosclerosis of the carotid siphons. No hyperdense vessel identified. Skull: Normal. Negative for fracture or focal lesion. Sinuses/Orbits: No acute finding. Other: None. IMPRESSION: 1. No acute intracranial abnormality identified. 2. Stable chronic right MCA distribution infarction. Stable chronic microvascular ischemic changes and volume loss of the brain. Electronically Signed   By: Kristine Garbe M.D.   On: 04/10/2018 04:03   Dg Chest Port 1 View  Result Date: 04/10/2018 CLINICAL DATA:  85 year old female with altered mental status. EXAM: PORTABLE CHEST 1 VIEW COMPARISON:  Chest radiograph dated 03/26/2018 FINDINGS: The lungs are clear. There is no pleural  effusion or pneumothorax. The cardiac silhouette is within normal limits. There is advanced atherosclerotic calcification of the aorta. Osteopenia. No acute osseous pathology. IMPRESSION: No active disease. Electronically Signed   By: Anner Crete M.D.   On: 04/10/2018 03:39    Pending Labs [employer] (From admission, onward)    Start     Ordered   Signed and Occupational hygienist morning,   R     Signed and Held   Signed and Held  CBC  Tomorrow morning,   R     Signed and Held          Vitals/Pain Today's Vitals   04/10/18 0730 04/10/18 0800 04/10/18 0830 04/10/18 0900  BP: (!) 143/59 (!) 141/51 (!) 149/59 137/66  Pulse: 76 78 79 79  Resp: 13 13 18 13   Temp:      TempSrc:      SpO2: 100% 99% 99% 99%  Weight:      Height:      PainSc:        Isolation Precautions No active isolations  Medications Medications  sodium chloride 0.9 % bolus 1,000 mL (0 mLs Intravenous Stopped 04/10/18 0837)  ondansetron (ZOFRAN) injection 4 mg (4 mg Intravenous Given 04/10/18 0640)    Mobility manual wheelchair High fall risk   Focused Assessments Neuro   R Recommendations: See Admitting Provider Note  Report given to:   Additional Notes:  Pt's husband died yesterday. Pt's grand daughter was admitted to the hospital yesterday as well. Pt under increased stress at home.

## 2018-04-10 NOTE — ED Provider Notes (Signed)
[facility] Emergency Department Provider Note   First MD Initiated Contact with Patient 04/10/18 0319     (approximate)  I have reviewed the triage vital signs and the nursing notes.   HISTORY  Chief Complaint Altered Mental Status    HPI Sonya [name] is a 85 y.o. female with below list of chronic medical conditions including recent hospitalization secondary to her mental status returns to the emergency department with history per EMS being found unresponsive by the patient's son.  EMS states that the patient son noted that she was difficult to arouse while sitting at home.  On EMS arrival blood pressure 72/35.  While in route patient's blood pressure improved to 132/50.  On arrival to the emergency department patient's blood pressure 152/57.  Patient does admit to feeling generalized weakness.  Past Medical History:  Diagnosis Date  . CAD (coronary artery disease)   . Carpal tunnel syndrome   . GERD (gastroesophageal reflux disease)   . History of shingles   . Hormone receptor positive breast cancer ([place])   . Hyperlipemia   . Hypertension   . Stroke [facility])     Patient Active Problem List   Diagnosis Date Noted  . Protein-calorie malnutrition, severe 03/30/2018  . Hypokalemia 03/26/2018  . Coffee ground emesis   . AVM (arteriovenous malformation) of stomach, acquired with hemorrhage   . Upper GI bleed 03/22/2018  . Pressure injury of skin 02/22/2018  . Decubitus ulcer of left ankle, unstageable ([place]) 02/22/2018  . Sepsis ([place]) 02/21/2018  . Calculus of common duct without obstruction   . Abnormal findings on diagnostic imaging of liver   . Problems with swallowing and mastication   . Acute gastritis without hemorrhage   . Stricture and stenosis of esophagus   . AKI (acute kidney injury) ([place]) 11/05/2017  . Nausea & vomiting 11/05/2017  . Choledocholithiasis   . Dysphasia   . Abdominal pain 10/30/2017  . Cerebral embolism with cerebral  infarction 10/13/2017  . CVA (cerebral vascular accident) ([place]) 10/13/2017  . Hypertension 10/12/2017  . Hypertensive urgency 10/12/2017  . Hypothyroidism 10/12/2017  . Left-sided weakness 10/12/2017  . Elevated troponin 10/12/2017  . Unknown when suspected stroke patient was last well 10/12/2017  . History of depression 12/05/2016  . Hypothyroidism due to acquired atrophy of thyroid 06/05/2014  . Chronic renal insufficiency 11/28/2013  . GERD (gastroesophageal reflux disease) 11/28/2013  . H/O vitamin D deficiency 11/28/2013  . History of ischemic heart disease 11/28/2013  . Hyperlipidemia 11/28/2013    Past Surgical History:  Procedure Laterality Date  . ABDOMINAL HYSTERECTOMY    . APPENDECTOMY    . CAROTID ANGIOGRAPHY Bilateral 03/31/2018   Procedure: CAROTID ANGIOGRAPHY;  Surgeon: Algernon Huxley, MD;  Location: [place] CV LAB;  Service: Cardiovascular;  Laterality: Bilateral;  . cornoary angioplasty    . ENDOSCOPIC RETROGRADE CHOLANGIOPANCREATOGRAPHY (ERCP) WITH PROPOFOL N/A 11/10/2017   Procedure: ENDOSCOPIC RETROGRADE CHOLANGIOPANCREATOGRAPHY (ERCP) WITH PROPOFOL;  Surgeon: Lucilla Lame, MD;  Location: [facility] ENDOSCOPY;  Service: Endoscopy;  Laterality: N/A;  . ESOPHAGOGASTRODUODENOSCOPY N/A 03/23/2018   Procedure: ESOPHAGOGASTRODUODENOSCOPY (EGD);  Surgeon: Lin Landsman, MD;  Location: [facility] ENDOSCOPY;  Service: Gastroenterology;  Laterality: N/A;  . ESOPHAGOGASTRODUODENOSCOPY (EGD) WITH PROPOFOL N/A 11/06/2017   Procedure: ESOPHAGOGASTRODUODENOSCOPY (EGD) WITH PROPOFOL;  Surgeon: Lucilla Lame, MD;  Location: [facility] ENDOSCOPY;  Service: Endoscopy;  Laterality: N/A;    Prior to Admission medications   Medication Sig Start Date End Date Taking? Authorizing Provider  amLODipine (NORVASC) 5 MG  tablet Take 1 tablet (5 mg total) by mouth daily. 04/02/18  Yes Gouru, Illene Silver, MD  apixaban (ELIQUIS) 2.5 MG TABS tablet Take 1 tablet (2.5 mg total) by mouth 2 (two) times daily. 03/24/18   Yes Epifanio Lesches, MD  aspirin EC 325 MG EC tablet Take 1 tablet (325 mg total) by mouth daily. 04/01/18  Yes Gouru, Illene Silver, MD  b complex vitamins tablet Take 1 tablet by mouth daily.   Yes [provider]  B Complex-C (B-COMPLEX WITH VITAMIN C) tablet Take 1 tablet by mouth daily. 03/28/18  Yes Gouru, Illene Silver, MD  bisacodyl (DUCODYL) 5 MG EC tablet Take 2 tablets (10 mg total) by mouth daily. 02/24/18  Yes Ojie, Jude, MD  Cholecalciferol (VITAMIN D3) 1000 units CAPS Take 3,000 Units by mouth at bedtime.  09/07/17  Yes [provider]  cloNIDine (CATAPRES) 0.2 MG tablet Take 1 tablet (0.2 mg total) by mouth 2 (two) times daily. Patient taking differently: Take 0.1 mg by mouth 2 (two) times daily.  10/15/17  Yes Emokpae, Courage, MD  hydrochlorothiazide (HYDRODIURIL) 12.5 MG tablet Take 12.5 mg by mouth daily. 04/05/18 04/05/19 Yes [provider]  lansoprazole (PREVACID) 30 MG capsule Take 1 capsule (30 mg total) by mouth daily. 03/24/18 03/24/19 Yes Epifanio Lesches, MD  levothyroxine (SYNTHROID, LEVOTHROID) 25 MCG tablet Take 25 mcg by mouth daily before breakfast.  09/10/17  Yes [provider]  lisinopril (PRINIVIL,ZESTRIL) 5 MG tablet Take 1 tablet (5 mg total) by mouth daily. 04/02/18  Yes Gouru, Illene Silver, MD  magnesium oxide (MAG-OX) 400 MG tablet Take 400 mg by mouth daily.   Yes [provider]  Melatonin 3 MG TABS Take 3 mg by mouth at bedtime as needed (sleep).    Yes [provider]  metoprolol tartrate (LOPRESSOR) 25 MG tablet Take 1 tablet (25 mg total) by mouth 2 (two) times daily. 03/27/18  Yes Gouru, Aruna, MD  ondansetron (ZOFRAN) 4 MG tablet Take 4 mg by mouth every 8 (eight) hours as needed for nausea. 03/19/18  Yes [provider]  ondansetron (ZOFRAN-ODT) 4 MG disintegrating tablet Take 1 tablet (4 mg total) by mouth every 8 (eight) hours as needed for nausea or vomiting. 02/24/18  Yes Ojie, Jude, MD  polyethylene glycol (MIRALAX  / GLYCOLAX) packet Take 17 g by mouth daily as needed for moderate constipation. 11/16/17  Yes Wieting, Richard, MD  potassium chloride SA (K-DUR,KLOR-CON) 20 MEQ tablet Take 20 mEq by mouth daily. 03/15/18 03/15/19 Yes [provider]  simvastatin (ZOCOR) 40 MG tablet Take 1 tablet (40 mg total) by mouth daily at 6 PM. 02/24/18  Yes Ojie, Jude, MD  venlafaxine XR (EFFEXOR-XR) 150 MG 24 hr capsule Take 150 mg by mouth at bedtime. 09/10/17  Yes [provider]  Amino Acids-Protein Hydrolys (FEEDING SUPPLEMENT, PRO-STAT SUGAR FREE 64,) LIQD Take 30 mLs by mouth 2 (two) times daily. 04/01/18   Gouru, Illene Silver, MD  feeding supplement, ENSURE ENLIVE, (ENSURE ENLIVE) LIQD Take 237 mLs by mouth 2 (two) times daily between meals. 04/01/18   Gouru, Illene Silver, MD  gabapentin (NEURONTIN) 100 MG capsule Take 100 mg by mouth 3 (three) times daily. 03/15/18 03/15/19  [provider]  HYDROcodone-acetaminophen (NORCO/VICODIN) 5-325 MG tablet Take 1 tablet by mouth every 6 (six) hours as needed for moderate pain or severe pain. 03/24/18   Epifanio Lesches, MD    Allergies Clopidogrel and Omeprazole  Family History  Problem Relation Age of Onset  . Hypertension Son   .  Ovarian cancer Mother   . Stroke Father   . Stroke Son     Social History Social History   Tobacco Use  . Smoking status: Former Smoker    Packs/day: 0.50    Years: 20.00    Pack years: 10.00    Types: Cigarettes    Last attempt to quit: 02/17/1998    Years since quitting: 20.1  . Smokeless tobacco: Never Used  Substance Use Topics  . Alcohol use: Not Currently  . Drug use: Never    Review of Systems Constitutional: No fever/chills Eyes: No visual changes. ENT: No sore throat. Cardiovascular: Denies chest pain. Respiratory: Denies shortness of breath. Gastrointestinal: No abdominal pain.  No nausea, no vomiting.  No diarrhea.  No constipation. Genitourinary: Negative for dysuria. Musculoskeletal: Negative for  neck pain.  Negative for back pain. Integumentary: Negative for rash. Neurological: Negative for headaches, focal weakness or numbness.   ____________________________________________   PHYSICAL EXAM:  VITAL SIGNS: ED Triage Vitals  Enc Vitals Group     BP 04/10/18 0319 (!) 152/57     Pulse Rate 04/10/18 0319 68     Resp 04/10/18 0319 16     Temp 04/10/18 0319 97.6 F (36.4 C)     Temp Source 04/10/18 0319 Oral     SpO2 04/10/18 0319 99 %     Weight 04/10/18 0315 50 kg (110 lb 3.7 oz)     Height 04/10/18 0315 1.524 m (5')     Head Circumference --      Peak Flow --      Pain Score 04/10/18 0314 0     Pain Loc --      Pain Edu? --      Excl. in [place]? --     Constitutional: Alert and oriented.  Somnolent but aroused by verbal stimuli  eyes: Conjunctivae are normal.  Head: Atraumatic. Mouth/Throat: Mucous membranes are moist.  Oropharynx non-erythematous. Neck: No stridor.   Cardiovascular: Normal rate, regular rhythm. Good peripheral circulation. Grossly normal heart sounds. Respiratory: Normal respiratory effort.  No retractions. Lungs CTAB. Gastrointestinal: Soft and nontender. No distention.  Guaiac positive Musculoskeletal: No lower extremity tenderness nor edema. No gross deformities of extremities. Neurologic:  Normal speech and language. No gross focal neurologic deficits are appreciated.  Skin:  Skin is warm, dry and intact. No rash noted. Psychiatric: Mood and affect are normal. Speech and behavior are normal.  ____________________________________________   LABS (all labs ordered are listed, but only abnormal results are displayed)  Labs Reviewed  BASIC METABOLIC PANEL - Abnormal; Notable for the following components:      Result Value   Glucose, Bld 160 (*)    BUN 31 (*)    Creatinine, Ser 1.59 (*)    GFR calc non Af Amer 30 (*)    GFR calc Af Amer 34 (*)    All other components within normal limits  CBC - Abnormal; Notable for the following components:     WBC 16.8 (*)    RBC 3.77 (*)    Hemoglobin 9.8 (*)    HCT 32.7 (*)    RDW 15.9 (*)    Platelets 455 (*)    All other components within normal limits  URINALYSIS, COMPLETE (UACMP) WITH MICROSCOPIC - Abnormal; Notable for the following components:   Color, Urine AMBER (*)    APPearance HAZY (*)    All other components within normal limits  TROPONIN I  TROPONIN I   ____________________________________________  EKG  ED  ECG REPORT I, [place] Gresia Isidoro, the attending physician, personally viewed and interpreted this ECG.   Date: 04/10/2018  EKG Time: 3:21 AM  Rate: 66  Rhythm: Normal  Axis: Normal  Intervals: Normal  ST&T Change: None  ____________________________________________  RADIOLOGY I, [place] N Zeev Deakins, personally viewed and evaluated these images (plain radiographs) as part of my medical decision making, as well as reviewing the written report by the radiologist.  ED MD interpretation: No acute intracranial abnormality identified on CT head  Chest x-ray revealed no active disease dialysis.  Official radiology report(s): Ct Head Wo Contrast  Result Date: 04/10/2018 CLINICAL DATA:  85 y/o F; found unresponsive. Left-sided deficits after a stroke. EXAM: CT HEAD WITHOUT CONTRAST TECHNIQUE: Contiguous axial images were obtained from the base of the skull through the vertex without intravenous contrast. COMPARISON:  03/27/2018, 02/21/2018 CT head. 03/27/2018 ultrasound head. FINDINGS: Brain: Stable right frontoparietal, insula, and thalamus chronic infarction. Stable white matter hypodensities compatible with chronic microvascular ischemic changes. Stable volume loss of the brain. No new acute stroke, hemorrhage, mass effect, extra-axial collection, hydrocephalus, or herniation. Vascular: Calcific atherosclerosis of the carotid siphons. No hyperdense vessel identified. Skull: Normal. Negative for fracture or focal lesion. Sinuses/Orbits: No acute finding. Other: None.  IMPRESSION: 1. No acute intracranial abnormality identified. 2. Stable chronic right MCA distribution infarction. Stable chronic microvascular ischemic changes and volume loss of the brain. Electronically Signed   By: Kristine Garbe M.D.   On: 04/10/2018 04:03   Dg Chest Port 1 View  Result Date: 04/10/2018 CLINICAL DATA:  85 year old female with altered mental status. EXAM: PORTABLE CHEST 1 VIEW COMPARISON:  Chest radiograph dated 03/26/2018 FINDINGS: The lungs are clear. There is no pleural effusion or pneumothorax. The cardiac silhouette is within normal limits. There is advanced atherosclerotic calcification of the aorta. Osteopenia. No acute osseous pathology. IMPRESSION: No active disease. Electronically Signed   By: Anner Crete M.D.   On: 04/10/2018 03:39      Procedures   ____________________________________________   INITIAL IMPRESSION / ASSESSMENT AND PLAN / ED COURSE  As part of my medical decision making, I reviewed the following data within the electronic MEDICAL RECORD NUMBER   85 year old female presented with above-stated history and physical exam.  Laboratory data revealed BUN and creatinine 31 and 1.59 hemoglobin of 9.8 with a hematocrit of 32.7 and persistently elevated white blood cell count which is currently 16.8.  Considered possibly of acute kidney injury given the fact that the patient's previous BUN/creatinine were normal also considered possibility of possible GI bleed and as such guaiac was performed which was positive.  Patient discussed with Dr. Tressia Miners for hospital admission for further evaluation and management of acute kidney injury and GI bleed. ____________________________________________  FINAL CLINICAL IMPRESSION(S) / ED DIAGNOSES  Final diagnoses:  AKI (acute kidney injury) ([place])  Gastrointestinal hemorrhage with melena     MEDICATIONS GIVEN DURING THIS VISIT:  Medications  sodium chloride 0.9 % bolus 1,000 mL (1,000 mLs Intravenous  New Bag/Given 04/10/18 0624)  ondansetron (ZOFRAN) injection 4 mg (4 mg Intravenous Given 04/10/18 3335)     ED Discharge Orders    None       Note:  This document was prepared using Dragon voice recognition software and may include unintentional dictation errors.   Gregor Hams, MD 04/10/18 2157

## 2018-04-10 NOTE — ED Triage Notes (Signed)
Pt to ED with EMS after family found pt unresponsive. Son was with pt while she was sitting in the house and EMS states her BP was low at 72/35 and pt was hard to arouse. After laying pt on the stretcher she became more responsive and BP increased to 132/50. Son verbalized to EMS that pt has had a hx of BP dropping.  Left sided deficits after a stroke. Pts husband died yesterday.

## 2018-04-10 NOTE — ED Notes (Signed)
Attempted to call report to [place], RN on 1C. They are unable to take pt at this time due to not having any off unit tele boxes.

## 2018-04-10 NOTE — H&P (Addendum)
[facility] [place] NAME: Sonya [name]    MR#:  [id number]  DATE OF BIRTH:  1932-03-01  DATE OF ADMISSION:  04/10/2018  PRIMARY CARE PHYSICIAN: Baxter Hire, MD   REQUESTING/REFERRING PHYSICIAN: Dr. Marjean Donna  CHIEF COMPLAINT:   Chief Complaint  Patient presents with  . Altered Mental Status    HISTORY OF PRESENT ILLNESS:  Sonya [name]  is a 85 y.o. female with a known history of stroke with left sided weakness on Eliquis, CAD, GERD, HTN, history of breast cancer who had 2 hospitalizations within the past month 1 for GI bleed and another last week for orthostatic hypotension and syncope is brought back again this morning secondary to dizziness and near syncopal episode after standing up.  Patient is a poor historian, most of the history is obtained from her grandson at bedside.  Patient's husband of 33 years passed away unexpectedly yesterday morning.  Since patient's last discharge last week, her son has been staying at her place to help her.  Her stroke from 6 months ago has left her with left-sided hemiplegia and she has been mostly bedbound and needing 1-2 person assist if she needs to use the bedside commode.  She was admitted last week with orthostatic hypotension as well as given IV fluids.  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  Family wanted to get this taken care of as outpatient.  She also has 85 to 90% left cervical carotid artery stenosis.  Patient complains of dizziness whenever she stands up, she is on blood pressure medications to address her hypertension. Urine analysis negative for any infection.  In the ED she is noted to have an elevated BUN and creatinine.  CT head is without any acute findings  PAST MEDICAL HISTORY:   Past Medical History:  Diagnosis Date  . CAD (coronary artery disease)   . Carpal tunnel syndrome     . GERD (gastroesophageal reflux disease)   . History of shingles   . Hormone receptor positive breast cancer ([place])   . Hyperlipemia   . Hypertension   . Stroke [facility])    left sided weakness    PAST SURGICAL HISTORY:   Past Surgical History:  Procedure Laterality Date  . ABDOMINAL HYSTERECTOMY    . APPENDECTOMY    . CAROTID ANGIOGRAPHY Bilateral 03/31/2018   Procedure: CAROTID ANGIOGRAPHY;  Surgeon: Algernon Huxley, MD;  Location: [facility];  Service: Cardiovascular;  Laterality: Bilateral;  . cornoary angioplasty    . ENDOSCOPIC RETROGRADE CHOLANGIOPANCREATOGRAPHY (ERCP) WITH PROPOFOL N/A 11/10/2017   Procedure: ENDOSCOPIC RETROGRADE CHOLANGIOPANCREATOGRAPHY (ERCP) WITH PROPOFOL;  Surgeon: Lucilla Lame, MD;  Location: [facility] ENDOSCOPY;  Service: Endoscopy;  Laterality: N/A;  . ESOPHAGOGASTRODUODENOSCOPY N/A 03/23/2018   Procedure: ESOPHAGOGASTRODUODENOSCOPY (EGD);  Surgeon: Lin Landsman, MD;  Location: [facility] ENDOSCOPY;  Service: Gastroenterology;  Laterality: N/A;  . ESOPHAGOGASTRODUODENOSCOPY (EGD) WITH PROPOFOL N/A 11/06/2017   Procedure: ESOPHAGOGASTRODUODENOSCOPY (EGD) WITH PROPOFOL;  Surgeon: Lucilla Lame, MD;  Location: [facility] ENDOSCOPY;  Service: Endoscopy;  Laterality: N/A;    SOCIAL HISTORY:   Social History   Tobacco Use  . Smoking status: Former Smoker    Packs/day: 0.50    Years: 20.00    Pack years: 10.00    Types: Cigarettes    Last attempt to quit: 02/17/1998    Years since quitting: 20.1  . Smokeless tobacco: Never Used  Substance Use Topics  . Alcohol use: Not Currently    FAMILY HISTORY:   Family History  Problem Relation Age of Onset  . Hypertension Son   . Ovarian cancer Mother   . Stroke Father   . Stroke Son     DRUG ALLERGIES:   Allergies  Allergen Reactions  . Clopidogrel Nausea And Vomiting     reported by [place] 11/24/13  . Omeprazole Other (See Comments)    Unknown reaction - reported by [place] 11/24/13    REVIEW OF SYSTEMS:   Review of Systems  Constitutional: Positive for malaise/fatigue. Negative for chills, fever and weight loss.  HENT: Negative for ear discharge, ear pain, hearing loss and nosebleeds.   Eyes: Negative for blurred vision, double vision and photophobia.  Respiratory: Negative for cough, hemoptysis, shortness of breath and wheezing.   Cardiovascular: Negative for chest pain, palpitations, orthopnea and leg swelling.  Gastrointestinal: Negative for abdominal pain, constipation, diarrhea, heartburn, melena, nausea and vomiting.  Genitourinary: Negative for dysuria, frequency, hematuria and urgency.  Musculoskeletal: Positive for falls. Negative for back pain, myalgias and neck pain.  Skin: Negative for rash.  Neurological: Positive for dizziness and focal weakness. Negative for tingling, tremors, sensory change, speech change and headaches.  Endo/Heme/Allergies: Does not bruise/bleed easily.  Psychiatric/Behavioral: Negative for depression.    MEDICATIONS AT HOME:   Prior to Admission medications   Medication Sig Start Date End Date Taking? Authorizing Provider  amLODipine (NORVASC) 5 MG tablet Take 1 tablet (5 mg total) by mouth daily. 04/02/18  Yes Gouru, Illene Silver, MD  apixaban (ELIQUIS) 2.5 MG TABS tablet Take 1 tablet (2.5 mg total) by mouth 2 (two) times daily. 03/24/18  Yes Epifanio Lesches, MD  aspirin EC 325 MG EC tablet Take 1 tablet (325 mg total) by mouth daily. 04/01/18  Yes Gouru, Illene Silver, MD  b complex vitamins tablet Take 1 tablet by mouth daily.   Yes [provider]  B Complex-C (B-COMPLEX WITH VITAMIN C) tablet Take 1 tablet by mouth daily. 03/28/18  Yes Gouru, Illene Silver, MD  bisacodyl (DUCODYL) 5 MG EC tablet Take 2 tablets (10 mg total) by mouth daily. 02/24/18  Yes Ojie, Jude, MD  Cholecalciferol (VITAMIN D3) 1000 units CAPS Take 3,000 Units by mouth at bedtime.  09/07/17  Yes [provider]  cloNIDine (CATAPRES) 0.2 MG  tablet Take 1 tablet (0.2 mg total) by mouth 2 (two) times daily. Patient taking differently: Take 0.1 mg by mouth 2 (two) times daily.  10/15/17  Yes Emokpae, Courage, MD  hydrochlorothiazide (HYDRODIURIL) 12.5 MG tablet Take 12.5 mg by mouth daily. 04/05/18 04/05/19 Yes [provider]  lansoprazole (PREVACID) 30 MG capsule Take 1 capsule (30 mg total) by mouth daily. 03/24/18 03/24/19 Yes Epifanio Lesches, MD  levothyroxine (SYNTHROID, LEVOTHROID) 25 MCG tablet Take 25 mcg by mouth daily before breakfast.  09/10/17  Yes [provider]  lisinopril (PRINIVIL,ZESTRIL) 5 MG tablet Take 1 tablet (5 mg total) by mouth daily. 04/02/18  Yes Gouru, Illene Silver, MD  magnesium oxide (MAG-OX) 400 MG tablet Take 400 mg by mouth daily.   Yes [provider]  Melatonin 3 MG TABS Take 3 mg by mouth at bedtime as needed (sleep).    Yes [provider]  metoprolol tartrate (LOPRESSOR) 25 MG tablet Take 1 tablet (25 mg total) by mouth 2 (two) times daily. 03/27/18  Yes Gouru, Aruna, MD  ondansetron (ZOFRAN) 4 MG tablet Take 4 mg by mouth every  8 (eight) hours as needed for nausea. 03/19/18  Yes [provider]  ondansetron (ZOFRAN-ODT) 4 MG disintegrating tablet Take 1 tablet (4 mg total) by mouth every 8 (eight) hours as needed for nausea or vomiting. 02/24/18  Yes Ojie, Jude, MD  polyethylene glycol (MIRALAX / GLYCOLAX) packet Take 17 g by mouth daily as needed for moderate constipation. 11/16/17  Yes Wieting, Richard, MD  potassium chloride SA (K-DUR,KLOR-CON) 20 MEQ tablet Take 20 mEq by mouth daily. 03/15/18 03/15/19 Yes [provider]  simvastatin (ZOCOR) 40 MG tablet Take 1 tablet (40 mg total) by mouth daily at 6 PM. 02/24/18  Yes Ojie, Jude, MD  venlafaxine XR (EFFEXOR-XR) 150 MG 24 hr capsule Take 150 mg by mouth at bedtime. 09/10/17  Yes [provider]  Amino Acids-Protein Hydrolys (FEEDING SUPPLEMENT, PRO-STAT SUGAR FREE 64,) LIQD Take 30 mLs by mouth 2 (two)  times daily. 04/01/18   Gouru, Illene Silver, MD  feeding supplement, ENSURE ENLIVE, (ENSURE ENLIVE) LIQD Take 237 mLs by mouth 2 (two) times daily between meals. 04/01/18   Gouru, Illene Silver, MD  gabapentin (NEURONTIN) 100 MG capsule Take 100 mg by mouth 3 (three) times daily. 03/15/18 03/15/19  [provider]  HYDROcodone-acetaminophen (NORCO/VICODIN) 5-325 MG tablet Take 1 tablet by mouth every 6 (six) hours as needed for moderate pain or severe pain. 03/24/18   Epifanio Lesches, MD      VITAL SIGNS:  Blood pressure 137/66, pulse 79, temperature 97.6 F (36.4 C), temperature source Oral, resp. rate 13, height 5' (1.524 m), weight 50 kg, SpO2 99 %.  PHYSICAL EXAMINATION:   Physical Exam  GENERAL:  85 y.o.-year-old patient lying in the bed with no acute distress.  EYES: Pupils equal, round, reactive to light and accommodation. No scleral icterus. Extraocular muscles intact.  HEENT: Head atraumatic, normocephalic. Oropharynx and nasopharynx clear.  NECK:  Supple, no jugular venous distention. No thyroid enlargement, no tenderness.  LUNGS: Normal breath sounds bilaterally, no wheezing, rales,rhonchi or crepitation. No use of accessory muscles of respiration. Decreased bibasilar breath sounds CARDIOVASCULAR: S1, S2 normal. No  rubs, or gallops. 2/6 systolic murmur present ABDOMEN: Soft, nontender, nondistended. Bowel sounds present. No organomegaly or mass.  EXTREMITIES: No pedal edema, cyanosis, or clubbing.  NEUROLOGIC: Cranial nerves II through XII are intact. Muscle strength 5/5 in RUE and RLE< 2/5 in LLE and 0/5 in LUE. Sensation intact. Gait not checked.  PSYCHIATRIC: The patient is alert and oriented x 3.  SKIN: No obvious rash, lesion, or ulcer.   LABORATORY PANEL:   CBC Recent Labs  Lab 04/10/18 0318  WBC 16.8*  HGB 9.8*  HCT 32.7*  PLT 455*   ------------------------------------------------------------------------------------------------------------------  Chemistries    Recent Labs  Lab 04/10/18 0318  NA 135  K 4.8  CL 103  CO2 24  GLUCOSE 160*  BUN 31*  CREATININE 1.59*  CALCIUM 9.1   ------------------------------------------------------------------------------------------------------------------  Cardiac Enzymes Recent Labs  Lab 04/10/18 0710  TROPONINI <0.03   ------------------------------------------------------------------------------------------------------------------  RADIOLOGY:  Ct Head Wo Contrast  Result Date: 04/10/2018 CLINICAL DATA:  84 y/o F; found unresponsive. Left-sided deficits after a stroke. EXAM: CT HEAD WITHOUT CONTRAST TECHNIQUE: Contiguous axial images were obtained from the base of the skull through the vertex without intravenous contrast. COMPARISON:  03/27/2018, 02/21/2018 CT head. 03/27/2018 ultrasound head. FINDINGS: Brain: Stable right frontoparietal, insula, and thalamus chronic infarction. Stable white matter hypodensities compatible with chronic microvascular ischemic changes. Stable volume loss of the brain. No new acute stroke, hemorrhage, mass effect, extra-axial  collection, hydrocephalus, or herniation. Vascular: Calcific atherosclerosis of the carotid siphons. No hyperdense vessel identified. Skull: Normal. Negative for fracture or focal lesion. Sinuses/Orbits: No acute finding. Other: None. IMPRESSION: 1. No acute intracranial abnormality identified. 2. Stable chronic right MCA distribution infarction. Stable chronic microvascular ischemic changes and volume loss of the brain. Electronically Signed   By: Kristine Garbe M.D.   On: 04/10/2018 04:03   Dg Chest Port 1 View  Result Date: 04/10/2018 CLINICAL DATA:  85 year old female with altered mental status. EXAM: PORTABLE CHEST 1 VIEW COMPARISON:  Chest radiograph dated 03/26/2018 FINDINGS: The lungs are clear. There is no pleural effusion or pneumothorax. The cardiac silhouette is within normal limits. There is advanced atherosclerotic calcification  of the aorta. Osteopenia. No acute osseous pathology. IMPRESSION: No active disease. Electronically Signed   By: Anner Crete M.D.   On: 04/10/2018 03:39    EKG:   Orders placed or performed during the hospital encounter of 04/10/18  . ED EKG  . ED EKG  . EKG 12-Lead  . EKG 12-Lead    IMPRESSION AND PLAN:   Ahnesty Finfrock  is a 85 y.o. female with a known history of stroke with left sided weakness on Eliquis, CAD, GERD, HTN, history of breast cancer who had 2 hospitalizations within the past month 1 for GI bleed and another last week for orthostatic hypotension and syncope is brought back again this morning secondary to dizziness and near syncopal episode after standing up.  1.  Orthostatic hypotension-no underlying infection has been identified. -IV fluids since BUN and creatinine are elevated.  Hold some of her blood pressure medications -Continue to check orthostatics as tolerated -Concerning for left-sided vascular occlusions are partly responsible for her symptoms.  Will touch base with vascular this admission -Monitor on off unit telemetry  2.  Leukocytosis-has been persistent since the last couple of months.  Check a differential count in a.m.  Hemoglobin has been low which is her baseline.  Platelets are within normal limits.  No source of infection identified.  3.  Acute renal failure-likely prerenal.  Continue to monitor with fluids and avoid nephrotoxins  4.  History of stroke and vascular occlusions-concern for paroxysmal A. fib.  Continue Eliquis at adjusted dose given her history of GI bleed  5.  Hypothyroidism-continue Synthroid  6.  DVT prophylaxis-patient is on Eliquis  Mostly bedbound at baseline.  PT/OT consult   All the records are reviewed and case discussed with ED provider. Management plans discussed with the patient, family and they are in agreement.  CODE STATUS: DNR  TOTAL TIME TAKING CARE OF THIS PATIENT: 51 minutes.    Gladstone Lighter M.D  on 04/10/2018 at 9:37 AM  Between 7am to 6pm - Pager - [phone]  After 6pm go to [email or web address] - password [email or web address] [facility]  Office  [phone]  CC: Primary care physician; Baxter Hire, MD

## 2018-04-10 NOTE — ED Notes (Signed)
MD at bedside to assess patient.

## 2018-04-10 NOTE — ED Notes (Signed)
Sarah, EDT called to transport pt to the floor.

## 2018-04-10 NOTE — Progress Notes (Signed)
   [place] at [facility] Day: 0 days Sonya [name] is a 85 y.o. female presenting with Altered Mental Status .   Advance care planning discussed with patient  And her grandson at bedside.  Patient is alert and oriented and was able to participate in a conversation. All questions in regards to overall condition and expected prognosis answered.  Patient and family stated that this conversation has happened in the past and patient clearly mentions that she would like to be a DNR.   The decision was made to continue current code status  CODE STATUS: DNR Time spent: 18 minutes

## 2018-04-10 NOTE — ED Notes (Signed)
Called bed placement to check on status of pt getting bed on 2A since we are short on tele boxed. They will reassign pt to bed on 2A

## 2018-04-10 NOTE — ED Notes (Addendum)
Pt resting in room. Son at bedside. Pt is easily arousable, no neuro changes noted. Lights dimmed. Bed locked and in lowest position.

## 2018-04-10 NOTE — ED Notes (Signed)
MD at bedside. Pt reporting chronic pain in her back and verbalized to MD that she took 1 Vicodin pill for pain.

## 2018-04-10 NOTE — Evaluation (Signed)
Physical Therapy Evaluation Patient Details Name: Sonya [name] MRN: [id number] DOB: 11/21/1932 Today's Date: 04/10/2018   History of Present Illness  85 y/o female that presents with AMS with dizziness upon standing at home to transfer.  Pt has just lost her husband of 58 years yesterday.  Has been hospitalized with syncopal episodes and GI bleed in the last month, now returning with another near syncopal episode.  PMHx:  L hemiparesis from infarction of right frontoparietal, insula, and thalamic regions, atherosclerosis, orthostatic hypotension, leukocytosis, GI bleed, Acute renal failure,shingles, CAD, CTS, HTN, HLD, a-fib, breast CA   Clinical Impression  Pt was seen for mobility and strengthening to increase her tolerance for sitting balance and see if she is appropriate to go directly home.  Her concern is that pt is wanting to resist efforts to gain control of sitting balance and demonstrate midline postural awareness.  Her efforts are unfocused to the task, and therefore recommend PT see her in an inpt rehab setting to allow a safer transition home.  Per pt the son is working part time and therefore would be less ideal for her care needs.    Follow Up Recommendations SNF    Equipment Recommendations  None recommended by PT    Recommendations for Other Services       Precautions / Restrictions Precautions Precautions: Fall Precaution Comments: L hemiparesis Restrictions Weight Bearing Restrictions: No Other Position/Activity Restrictions: monitor balance with L side instablity      Mobility  Bed Mobility Overal bed mobility: Needs Assistance Bed Mobility: Sit to Supine;Supine to Sit     Supine to sit: Max assist Sit to supine: Max assist      Transfers                 General transfer comment: unsafe to attempt due to pt resisting efforts to steady her sitting posture  Ambulation/Gait                Stairs            Wheelchair Mobility     Modified Rankin (Stroke Patients Only) Modified Rankin (Stroke Patients Only) Pre-Morbid Rankin Score: Moderately severe disability Modified Rankin: Severe disability     Balance Overall balance assessment: Needs assistance Sitting-balance support: Feet supported;Bilateral upper extremity supported Sitting balance-Leahy Scale: Poor Sitting balance - Comments: pt was resisting to extend trunk even with repetitive instructions to remain in upright sitting posture                                     Pertinent Vitals/Pain Pain Assessment: No/denies pain    Home Living Family/patient expects to be discharged to:: Private residence Living Arrangements: Children;Other (Comment)(husband just passed away yesterday) Available Help at Discharge: Family;Available 24 hours/day Type of Home: House Home Access: Ramped entrance     Home Layout: One level Home Equipment: Wheelchair - manual;Bedside commode;Cane - single point;Hospital bed Additional Comments: pt reports she has been able to get into wc with her son's help    Prior Function Level of Independence: Needs assistance   Gait / Transfers Assistance Needed: Son/spouse assist with bed mobility and transfers, pt non-ambulatory; recent fall with pt sliding out of recliner, no other recent falls  ADL's / Homemaking Assistance Needed: Son/spouse assist with all ADLs  Comments: help for all mobility from family     Hand Dominance   Dominant Hand: Right  Extremity/Trunk Assessment   Upper Extremity Assessment Upper Extremity Assessment: Generalized weakness LUE Deficits / Details: hemiparesis with dense weakness    Lower Extremity Assessment Lower Extremity Assessment: LLE deficits/detail LLE Deficits / Details: Significant loss of strength, unable to determine specific myotomes as patient had difficulty following commands LLE: Unable to fully assess due to immobilization LLE Coordination: decreased fine  motor;decreased gross motor    Cervical / Trunk Assessment Cervical / Trunk Assessment: Kyphotic  Communication   Communication: No difficulties  Cognition Arousal/Alertness: Lethargic Behavior During Therapy: Flat affect Overall Cognitive Status: No family/caregiver present to determine baseline cognitive functioning                                 General Comments: pt followed mobility instructions poorly but no family for context      General Comments General comments (skin integrity, edema, etc.): sits with poor balance, uable to safely attempt standing    Exercises     Assessment/Plan    PT Assessment Patient needs continued PT services  PT Problem List Decreased strength;Decreased balance;Decreased range of motion;Decreased activity tolerance;Decreased mobility;Decreased knowledge of use of DME;Decreased safety awareness;Decreased knowledge of precautions;Decreased cognition       PT Treatment Interventions DME instruction;Functional mobility training;Gait training;Therapeutic activities;Stair training;Therapeutic exercise;Neuromuscular re-education;Balance training    PT Goals (Current goals can be found in the Care Plan section)  Acute Rehab PT Goals Patient Stated Goal: to get home with son PT Goal Formulation: With patient Time For Goal Achievement: 04/24/18 Potential to Achieve Goals: Fair    Frequency Min 2X/week   Barriers to discharge Decreased caregiver support 2 person assist needed    Co-evaluation               AM-PAC PT "6 Clicks" Mobility  Outcome Measure Help needed turning from your back to your side while in a flat bed without using bedrails?: A Little Help needed moving from lying on your back to sitting on the side of a flat bed without using bedrails?: A Little Help needed moving to and from a bed to a chair (including a wheelchair)?: A Lot Help needed standing up from a chair using your arms (e.g., wheelchair or bedside  chair)?: A Lot Help needed to walk in hospital room?: Total Help needed climbing 3-5 steps with a railing? : Total 6 Click Score: 12    End of Session Equipment Utilized During Treatment: Gait belt Activity Tolerance: Patient tolerated treatment well;Patient limited by fatigue Patient left: in bed;with call bell/phone within reach;with bed alarm set;with family/visitor present Nurse Communication: Mobility status PT Visit Diagnosis: Muscle weakness (generalized) (M62.81);Difficulty in walking, not elsewhere classified (R26.2)    Time: 3825-0539 PT Time Calculation (min) (ACUTE ONLY): 26 min   Charges:   PT Evaluation $PT Eval Moderate Complexity: 1 Mod PT Treatments $Therapeutic Activity: 8-22 mins       Ramond Dial 04/10/2018, 10:43 PM  Mee Hives, PT MS Acute Rehab Dept. Number: [place]

## 2018-04-10 NOTE — ED Notes (Signed)
Call dining services and requested meal tray be sent for pt. Requested that Mashed potatoes and jello be sent for pt so that she is able to eat it.

## 2018-04-11 DIAGNOSIS — R4182 Altered mental status, unspecified: Secondary | ICD-10-CM | POA: Diagnosis not present

## 2018-04-11 DIAGNOSIS — I951 Orthostatic hypotension: Secondary | ICD-10-CM | POA: Diagnosis not present

## 2018-04-11 LAB — BASIC METABOLIC PANEL
Anion gap: 8 (ref 5–15)
BUN: 20 mg/dL (ref 8–23)
CO2: 22 mmol/L (ref 22–32)
Calcium: 8.4 mg/dL — ABNORMAL LOW (ref 8.9–10.3)
Chloride: 108 mmol/L (ref 98–111)
Creatinine, Ser: 0.86 mg/dL (ref 0.44–1.00)
GFR calc Af Amer: >60 mL/min (ref >60)
GFR calc non Af Amer: >60 mL/min (ref >60)
Glucose, Bld: 78 mg/dL (ref 70–99)
Potassium: 3.7 mmol/L (ref 3.5–5.1)
SODIUM: 138 mmol/L (ref 135–145)

## 2018-04-11 LAB — CBC
HCT: 28.6 % — ABNORMAL LOW (ref 36.0–46.0)
Hemoglobin: 8.6 g/dL — ABNORMAL LOW (ref 12.0–15.0)
MCH: 26.3 pg (ref 26.0–34.0)
MCHC: 30.1 g/dL (ref 30.0–36.0)
MCV: 87.5 fL (ref 80.0–100.0)
Platelets: 356 10*3/uL (ref 150–400)
RBC: 3.27 MIL/uL — ABNORMAL LOW (ref 3.87–5.11)
RDW: 15.8 % — ABNORMAL HIGH (ref 11.5–15.5)
WBC: 17.2 10*3/uL — ABNORMAL HIGH (ref 4.0–10.5)
nRBC: 0 % (ref 0.0–0.2)

## 2018-04-11 MED ORDER — CHLORHEXIDINE GLUCONATE CLOTH 2 % EX PADS
6.0000 | MEDICATED_PAD | Freq: Every day | CUTANEOUS | Status: DC
  Administered 2018-04-11 – 2018-04-12 (×2): 6 via TOPICAL

## 2018-04-11 MED ORDER — MUPIROCIN 2 % EX OINT
1.0000 "application " | TOPICAL_OINTMENT | Freq: Two times a day (BID) | CUTANEOUS | Status: DC
  Administered 2018-04-11 (×3): 1 via NASAL
  Filled 2018-04-11: qty 22

## 2018-04-11 NOTE — Plan of Care (Signed)
  Problem: Pain Managment: Goal: General experience of comfort will improve Outcome: Progressing   

## 2018-04-11 NOTE — Plan of Care (Signed)
  Problem: Safety: Goal: Ability to remain free from injury will improve Outcome: Progressing   

## 2018-04-11 NOTE — Progress Notes (Signed)
[name] [place] NAME: Sonya [name]    MR#:  [id number]  DATE OF BIRTH:  07-Jul-1932  SUBJECTIVE:  CHIEF COMPLAINT:   Chief Complaint  Patient presents with  . Altered Mental Status   -Feels some better than yesterday.  Denies any complaints. -Son updated at bedside  REVIEW OF SYSTEMS:  Review of Systems  Constitutional: Positive for malaise/fatigue. Negative for chills and fever.  HENT: Negative for hearing loss and tinnitus.   Eyes: Negative for blurred vision and double vision.  Respiratory: Negative for cough, shortness of breath and wheezing.   Cardiovascular: Negative for chest pain and palpitations.  Gastrointestinal: Negative for abdominal pain, constipation, diarrhea, nausea and vomiting.  Genitourinary: Negative for dysuria.  Musculoskeletal: Positive for myalgias.  Neurological: Positive for dizziness and weakness. Negative for focal weakness, seizures and headaches.  Psychiatric/Behavioral: Negative for depression.    DRUG ALLERGIES:   Allergies  Allergen Reactions  . Clopidogrel Nausea And Vomiting     reported by [facility] 11/24/13  . Omeprazole Other (See Comments)    Unknown reaction - reported by [facility] 11/24/13    VITALS:  Blood pressure (!) 133/53, pulse 76, temperature 98.3 F (36.8 C), temperature source Oral, resp. rate 17, height 5' (1.524 m), weight 43.3 kg, SpO2 97 %.  PHYSICAL EXAMINATION:  Physical Exam  GENERAL:  85 y.o.-year-old patient lying in the bed with no acute distress.  EYES: Pupils equal, round, reactive to light and accommodation. No scleral icterus. Extraocular muscles intact.  HEENT: Head atraumatic, normocephalic. Oropharynx and nasopharynx clear.  NECK:  Supple, no jugular venous distention. No thyroid enlargement, no tenderness.  LUNGS: Normal breath sounds bilaterally, no wheezing, rales,rhonchi or crepitation. No use of accessory  muscles of respiration. Decreased bibasilar breath sounds CARDIOVASCULAR: S1, S2 normal. No  rubs, or gallops. 2/6 systolic murmur present ABDOMEN: Soft, nontender, nondistended. Bowel sounds present. No organomegaly or mass.  EXTREMITIES: No pedal edema, cyanosis, or clubbing.  NEUROLOGIC: Cranial nerves II through XII are intact. Muscle strength 5/5 in RUE and RLE< 2/5 in LLE and 0/5 in LUE. Sensation intact. Gait not checked.  PSYCHIATRIC: The patient is alert and oriented x 3.  SKIN: No obvious rash, lesion, or ulcer.   LABORATORY PANEL:   CBC Recent Labs  Lab 04/11/18 0424  WBC 17.2*  HGB 8.6*  HCT 28.6*  PLT 356   ------------------------------------------------------------------------------------------------------------------  Chemistries  Recent Labs  Lab 04/11/18 0424  NA 138  K 3.7  CL 108  CO2 22  GLUCOSE 78  BUN 20  CREATININE 0.86  CALCIUM 8.4*   ------------------------------------------------------------------------------------------------------------------  Cardiac Enzymes Recent Labs  Lab 04/10/18 0710  TROPONINI <0.03   ------------------------------------------------------------------------------------------------------------------  RADIOLOGY:  Ct Head Wo Contrast  Result Date: 04/10/2018 CLINICAL DATA:  85 y/o F; found unresponsive. Left-sided deficits after a stroke. EXAM: CT HEAD WITHOUT CONTRAST TECHNIQUE: Contiguous axial images were obtained from the base of the skull through the vertex without intravenous contrast. COMPARISON:  03/27/2018, 02/21/2018 CT head. 03/27/2018 ultrasound head. FINDINGS: Brain: Stable right frontoparietal, insula, and thalamus chronic infarction. Stable white matter hypodensities compatible with chronic microvascular ischemic changes. Stable volume loss of the brain. No new acute stroke, hemorrhage, mass effect, extra-axial collection, hydrocephalus, or herniation. Vascular: Calcific atherosclerosis of the carotid  siphons. No hyperdense vessel identified. Skull: Normal. Negative for fracture or focal lesion. Sinuses/Orbits: No acute finding. Other: None. IMPRESSION: 1. No acute intracranial abnormality identified. 2.  Stable chronic right MCA distribution infarction. Stable chronic microvascular ischemic changes and volume loss of the brain. Electronically Signed   By: Kristine Garbe M.D.   On: 04/10/2018 04:03   Dg Chest Port 1 View  Result Date: 04/10/2018 CLINICAL DATA:  85 year old female with altered mental status. EXAM: PORTABLE CHEST 1 VIEW COMPARISON:  Chest radiograph dated 03/26/2018 FINDINGS: The lungs are clear. There is no pleural effusion or pneumothorax. The cardiac silhouette is within normal limits. There is advanced atherosclerotic calcification of the aorta. Osteopenia. No acute osseous pathology. IMPRESSION: No active disease. Electronically Signed   By: Anner Crete M.D.   On: 04/10/2018 03:39    EKG:   Orders placed or performed during the hospital encounter of 04/10/18  . ED EKG  . ED EKG  . EKG 12-Lead  . EKG 12-Lead    ASSESSMENT AND PLAN:   Sonya [name]  is a 85 y.o. female with a known history of stroke with left sided weakness on Eliquis, CAD, GERD, HTN, history of breast cancer who had 2 hospitalizations within the past month 1 for GI bleed and another last week for orthostatic hypotension and syncope is brought back again this morning secondary to dizziness and near syncopal episode after standing up.  1.  Orthostatic hypotension-no underlying infection has been identified. -IV fluids since BUN and creatinine are elevated on admission-improved today..  Hold some of her blood pressure medications -Continue to check orthostatics as tolerated -Concerning for left-sided vascular occlusions are partly responsible for her symptoms.  -Outpatient follow-up scheduled with 2 weeks. -Monitor on off unit telemetry  2.  Leukocytosis-has been persistent since the  last couple of months.  Check a differential count in a.m.  Hemoglobin has been low which is her baseline.  Platelets are within normal limits.  No source of infection identified.  3.  Acute renal failure-likely prerenal.    Improved with fluids and avoid nephrotoxins  4.  History of stroke and vascular occlusions-concern for paroxysmal A. fib.  Continue Eliquis at adjusted dose given her history of GI bleed  5.  Hypothyroidism-continue Synthroid  6.  DVT prophylaxis-patient is on Eliquis  Mostly bedbound at baseline.  PT/OT consult  Home palliative at discharge.  Family interested in taking her home. Son updated at bedside    All the records are reviewed and case discussed with Care Management/Social Workerr. Management plans discussed with the patient, family and they are in agreement.  CODE STATUS: DNR  TOTAL TIME TAKING CARE OF THIS PATIENT: 38 minutes.   POSSIBLE D/C IN 1-2 DAYS, DEPENDING ON CLINICAL CONDITION.   Gladstone Lighter M.D on 04/11/2018 at 1:10 PM  Between 7am to 6pm - Pager - [phone]  After 6pm go to [email or web address] - password [email or web address] [facility]  Office  [phone]  CC: Primary care physician; Baxter Hire, MD

## 2018-04-12 DIAGNOSIS — R4182 Altered mental status, unspecified: Secondary | ICD-10-CM | POA: Diagnosis not present

## 2018-04-12 DIAGNOSIS — I951 Orthostatic hypotension: Secondary | ICD-10-CM | POA: Diagnosis not present

## 2018-04-12 LAB — CBC WITH DIFFERENTIAL/PLATELET
Abs Immature Granulocytes: 0.07 10*3/uL (ref 0.00–0.07)
Basophils Absolute: 0.1 10*3/uL (ref 0.0–0.1)
Basophils Relative: 0 %
Eosinophils Absolute: 0.3 10*3/uL (ref 0.0–0.5)
Eosinophils Relative: 3 %
HCT: 23.7 % — ABNORMAL LOW (ref 36.0–46.0)
Hemoglobin: 7.2 g/dL — ABNORMAL LOW (ref 12.0–15.0)
Immature Granulocytes: 1 %
Lymphocytes Relative: 26 %
Lymphs Abs: 3.1 10*3/uL (ref 0.7–4.0)
MCH: 25.9 pg — AB (ref 26.0–34.0)
MCHC: 30.4 g/dL (ref 30.0–36.0)
MCV: 85.3 fL (ref 80.0–100.0)
MONO ABS: 0.9 10*3/uL (ref 0.1–1.0)
Monocytes Relative: 8 %
Neutro Abs: 7.4 10*3/uL (ref 1.7–7.7)
Neutrophils Relative %: 62 %
Platelets: 319 10*3/uL (ref 150–400)
RBC: 2.78 MIL/uL — ABNORMAL LOW (ref 3.87–5.11)
RDW: 15.9 % — ABNORMAL HIGH (ref 11.5–15.5)
WBC: 11.8 10*3/uL — ABNORMAL HIGH (ref 4.0–10.5)
nRBC: 0 % (ref 0.0–0.2)

## 2018-04-12 LAB — FERRITIN: Ferritin: 26 ng/mL (ref 11–307)

## 2018-04-12 LAB — VITAMIN B12: Vitamin B-12: 448 pg/mL (ref 180–914)

## 2018-04-12 LAB — FOLATE: Folate: 32 ng/mL (ref >5.9)

## 2018-04-12 LAB — RETICULOCYTES
Immature Retic Fract: 22.1 % — ABNORMAL HIGH (ref 2.3–15.9)
RBC.: 2.94 MIL/uL — ABNORMAL LOW (ref 3.87–5.11)
RETIC COUNT ABSOLUTE: 87.6 10*3/uL (ref 19.0–186.0)
Retic Ct Pct: 3 % (ref 0.4–3.1)

## 2018-04-12 LAB — IRON AND TIBC
Iron: 22 ug/dL — ABNORMAL LOW (ref 28–170)
Saturation Ratios: 7 % — ABNORMAL LOW (ref 10.4–31.8)
TIBC: 301 ug/dL (ref 250–450)
UIBC: 279 ug/dL

## 2018-04-12 LAB — HEMOGLOBIN: Hemoglobin: 7.8 g/dL — ABNORMAL LOW (ref 12.0–15.0)

## 2018-04-12 MED ORDER — MIDODRINE HCL 2.5 MG PO TABS: 2.5000 mg | ORAL_TABLET | Freq: Two times a day (BID) | ORAL | 1 refills | Status: DC

## 2018-04-12 MED ORDER — TRAMADOL HCL 50 MG PO TABS
50.0000 mg | ORAL_TABLET | Freq: Four times a day (QID) | ORAL | Status: DC | PRN
  Administered 2018-04-12: 50 mg via ORAL
  Filled 2018-04-12: qty 1

## 2018-04-12 MED ORDER — FERROUS SULFATE 325 (65 FE) MG PO TABS: 325.0000 mg | ORAL_TABLET | Freq: Two times a day (BID) | ORAL | Status: DC

## 2018-04-12 MED ORDER — SODIUM CHLORIDE 0.9 % IV SOLN
200.0000 mg | Freq: Once | INTRAVENOUS | Status: AC
  Administered 2018-04-12: 200 mg via INTRAVENOUS
  Filled 2018-04-12: qty 10

## 2018-04-12 MED ORDER — FERROUS SULFATE 325 (65 FE) MG PO TABS: 325.0000 mg | ORAL_TABLET | Freq: Two times a day (BID) | ORAL | 3 refills | Status: DC

## 2018-04-12 NOTE — Clinical Social Work Note (Signed)
CSW received referral for SNF.  Case discussed with case manager and plan is to discharge home with home health.  CSW to sign off please re-consult if social work needs arise.  Berdell Nevitt R. Meet [place], MSW, LCSW [phone]  

## 2018-04-12 NOTE — Evaluation (Signed)
Occupational Therapy Evaluation Patient Details Name: Sonya [name] MRN: [id number] DOB: 1932-09-29 Today's Date: 04/12/2018    History of Present Illness 85 y/o female that presents with AMS with dizziness upon standing at home to transfer.  Pt has just lost her husband of 45 years yesterday.  Has been hospitalized with syncopal episodes and GI bleed in the last month, now returning with another near syncopal episode.  PMHx:  L hemiparesis from infarction of right frontoparietal, insula, and thalamic regions, atherosclerosis, orthostatic hypotension, leukocytosis, GI bleed, Acute renal failure,shingles, CAD, CTS, HTN, HLD, a-fib, breast CA    Clinical Impression   Pt seen for OT evaluation this date. Prior to hospital admission, pt was primarily at w/c level, requiring Max A from son for w/c transfers, Max A LB bathing/dressing, Mod-Max A for UB bathing/dressing, set up and PRN Min A for self feeding using RUE.  Pt lives in a 1 story home and her son has been living there as well to assist with pt and pt's spouses care. Of note, pt's spouse passed away 4 days ago, on 04-12-2018.  Currently pt demonstrates impairments in sitting balance, strength (weak on R, significant hemiparesis on L), pain (L ankle, L wrist, and back), activity tolerance, and nausea/dizziness with prolonged sitting EOB (approx 5 min). Pt now requires increased assist from baseline for ADL tasks. Pt tolerated sitting EOB with CGA to occasional Min A for sitting balance/posture with occasional verbal cues for self correcting posture with mirror as visual cue/reference. Pt able to brush teeth seated EOB with set up. Pt would benefit from skilled OT to address noted impairments and functional limitations (see below for any additional details) in order to maximize safety and independence while minimizing falls risk and caregiver burden. Upon hospital discharge, recommend pt discharge to home with continued [facility] services, including OT.     Follow Up Recommendations  Home health OT;Supervision/Assistance - 24 hour    Equipment Recommendations  Other (comment)(TBD)    Recommendations for Other Services       Precautions / Restrictions Precautions Precautions: Fall Precaution Comments: L hemiparesis Restrictions Weight Bearing Restrictions: No Other Position/Activity Restrictions: monitor balance with L side instablity      Mobility Bed Mobility Overal bed mobility: Needs Assistance Bed Mobility: Sit to Supine;Supine to Sit     Supine to sit: Max assist Sit to supine: Max assist      Transfers Overall transfer level: Needs assistance Equipment used: None Transfers: Lateral/Scoot Transfers          Lateral/Scoot Transfers: Max assist General transfer comment: Max A for lateral scoots alongside EOB to improve positioning     Balance Overall balance assessment: Needs assistance Sitting-balance support: Single extremity supported;Feet supported Sitting balance-Leahy Scale: Poor Sitting balance - Comments: cues to maintain sitting balance, visual reference from mirror with cues to use and pt able to assist in recorrecting posture, tendency for R lateral and posterior lean Postural control: Right lateral lean;Posterior lean   Standing balance-Leahy Scale: Zero                             ADL either performed or assessed with clinical judgement   ADL Overall ADL's : Needs assistance/impaired Eating/Feeding: Sitting;Set up;Minimal assistance Eating/Feeding Details (indicate cue type and reason): PRN Min A Grooming: Sitting;Set up;Min guard Grooming Details (indicate cue type and reason): CGA for sitting balance, cues to improve posture Upper Body Bathing: Sitting;Moderate assistance;Maximal assistance   Lower  Body Bathing: Maximal assistance;Bed level   Upper Body Dressing : Sitting;Moderate assistance;Maximal assistance   Lower Body Dressing: Bed level;Maximal assistance   Toilet  Transfer: Maximal assistance;BSC;Squat-pivot                   Vision Patient Visual Report: No change from baseline       Perception     Praxis      Pertinent Vitals/Pain Pain Assessment: Faces Faces Pain Scale: Hurts a little bit Pain Location: Back, L foot, L wrist Pain Descriptors / Indicators: Aching Pain Intervention(s): Limited activity within patient's tolerance;Monitored during session;Repositioned     Hand Dominance Right   Extremity/Trunk Assessment Upper Extremity Assessment Upper Extremity Assessment: LUE deficits/detail(RUE weak) LUE Deficits / Details: hemiparesis with significant weakness, stiffness, pain with any movement LUE: Unable to fully assess due to pain LUE Coordination: decreased gross motor;decreased fine motor   Lower Extremity Assessment Lower Extremity Assessment: LLE deficits/detail;Generalized weakness(RLE weak) LLE: Unable to fully assess due to immobilization;Unable to fully assess due to pain LLE Coordination: decreased gross motor;decreased fine motor   Cervical / Trunk Assessment Cervical / Trunk Assessment: Kyphotic   Communication Communication Communication: No difficulties   Cognition Arousal/Alertness: Lethargic Behavior During Therapy: Flat affect Overall Cognitive Status: Difficult to assess                                 General Comments: intermittent alertness, cues to open eyes, pt reports feeling sleepy, follows commands with time/cues, oriented x4   General Comments       Exercises Other Exercises Other Exercises: pt/son instructed in how to best assist pt in bed mobility and transfers to ensure son is maximzing safety for both pt and son as well as to ensure optimal body mechanics   Shoulder Instructions      Home Living Family/patient expects to be discharged to:: Private residence Living Arrangements: Children;Other (Comment)(husband just passed away yesterday; son moved back with parents  to assist with their care about 2 mo ago) Available Help at Discharge: Family;Available 24 hours/day Type of Home: House Home Access: Ramped entrance     Home Layout: One level     Bathroom Shower/Tub: Teacher, early years/pre: Handicapped height Bathroom Accessibility: Yes   Home Equipment: Wheelchair - manual;Bedside commode;Cane - single point;Hospital bed   Additional Comments: pt reports she has been able to get into wc with her son's help      Prior Functioning/Environment Level of Independence: Needs assistance  Gait / Transfers Assistance Needed: Son/spouse assist with bed mobility and transfers, pt non-ambulatory; recent fall with pt sliding out of recliner, no other recent falls ADL's / Homemaking Assistance Needed: Son/spouse assist with all IADLs (cooking, self feeding if needed, meds); [facility] assists with bathing, dressing    Comments: help for all mobility from family; [facility] home [facility] RN/PT/OT/HHA until this hospitalization        OT Problem List: Decreased strength;Decreased range of motion;Decreased coordination;Decreased activity tolerance;Impaired UE functional use;Pain;Impaired balance (sitting and/or standing)      OT Treatment/Interventions: Self-care/ADL training;Balance training;Therapeutic exercise;Neuromuscular education;Therapeutic activities;DME and/or AE instruction;Patient/family education;Manual therapy    OT Goals(Current goals can be found in the care plan section) Acute Rehab OT Goals Patient Stated Goal: to get home with son OT Goal Formulation: With patient/family Time For Goal Achievement: 04/26/18 Potential to Achieve Goals: Good  OT Frequency: Min 1X/week  Barriers to D/C:            Co-evaluation              AM-PAC OT "6 Clicks" Daily Activity     Outcome Measure Help from another person eating meals?: A Little Help from another person taking care of personal grooming?: A Little Help from  another person toileting, which includes using toliet, bedpan, or urinal?: A Lot Help from another person bathing (including washing, rinsing, drying)?: A Lot Help from another person to put on and taking off regular upper body clothing?: A Lot Help from another person to put on and taking off regular lower body clothing?: A Lot 6 Click Score: 14   End of Session    Activity Tolerance: Treatment limited secondary to medical complications (Comment)(became nauseated with 5 min sitting, requiring to lay back down) Patient left: in bed;with call bell/phone within reach;with bed alarm set;with family/visitor present  OT Visit Diagnosis: Other abnormalities of gait and mobility (R26.89);Muscle weakness (generalized) (M62.81);Pain Pain - Right/Left: Left(and back) Pain - part of body: Hand;Ankle and joints of foot                Time: 4696-2952 OT Time Calculation (min): 35 min Charges:  OT General Charges $OT Visit: 1 Visit OT Evaluation $OT Eval Moderate Complexity: 1 Mod OT Treatments $Self Care/Home Management : 8-22 mins  Jeni Salles, MPH, MS, OTR/L ascom [phone] 04/12/18, 1:21 PM

## 2018-04-12 NOTE — Progress Notes (Signed)
[facility] [place] NAME: Sonya [name]    MR#:  [id number]  DATE OF BIRTH:  02/09/1933  SUBJECTIVE:  CHIEF COMPLAINT:   Chief Complaint  Patient presents with  . Altered Mental Status   - complains of left wrist pain - no dizziness while in bed  REVIEW OF SYSTEMS:  Review of Systems  Constitutional: Positive for malaise/fatigue. Negative for chills and fever.  HENT: Negative for hearing loss and tinnitus.   Eyes: Negative for blurred vision and double vision.  Respiratory: Negative for cough, shortness of breath and wheezing.   Cardiovascular: Negative for chest pain and palpitations.  Gastrointestinal: Negative for abdominal pain, constipation, diarrhea, nausea and vomiting.  Genitourinary: Negative for dysuria.  Musculoskeletal: Positive for myalgias.  Neurological: Positive for dizziness and weakness. Negative for focal weakness, seizures and headaches.  Psychiatric/Behavioral: Negative for depression.    DRUG ALLERGIES:   Allergies  Allergen Reactions  . Clopidogrel Nausea And Vomiting     reported by [facility] 11/24/13  . Omeprazole Other (See Comments)    Unknown reaction - reported by [facility] 11/24/13    VITALS:  Blood pressure (!) 139/54, pulse 73, temperature 97.7 F (36.5 C), temperature source Oral, resp. rate 18, height 5' (1.524 m), weight 43 kg, SpO2 100 %.  PHYSICAL EXAMINATION:  Physical Exam  GENERAL:  85 y.o.-year-old patient lying in the bed with no acute distress.  EYES: Pupils equal, round, reactive to light and accommodation. No scleral icterus. Extraocular muscles intact.  HEENT: Head atraumatic, normocephalic. Oropharynx and nasopharynx clear.  NECK:  Supple, no jugular venous distention. No thyroid enlargement, no tenderness.  LUNGS: Normal breath sounds bilaterally, no wheezing, rales,rhonchi or crepitation. No use of accessory muscles of respiration.  Decreased bibasilar breath sounds CARDIOVASCULAR: S1, S2 normal. No  rubs, or gallops. 2/6 systolic murmur present ABDOMEN: Soft, nontender, nondistended. Bowel sounds present. No organomegaly or mass.  EXTREMITIES: No pedal edema, cyanosis, or clubbing.  Swelling of the left arm including the wrist noted.  No concern for any fracture on exam NEUROLOGIC: Cranial nerves II through XII are intact. Muscle strength 5/5 in RUE and RLE, < 2/5 in LLE and 0/5 in LUE. Sensation intact. Gait not checked.  PSYCHIATRIC: The patient is alert and oriented x 3.  SKIN: No obvious rash, lesion, or ulcer.   LABORATORY PANEL:   CBC Recent Labs  Lab 04/12/18 0524  WBC 11.8*  HGB 7.2*  HCT 23.7*  PLT 319   ------------------------------------------------------------------------------------------------------------------  Chemistries  Recent Labs  Lab 04/11/18 0424  NA 138  K 3.7  CL 108  CO2 22  GLUCOSE 78  BUN 20  CREATININE 0.86  CALCIUM 8.4*   ------------------------------------------------------------------------------------------------------------------  Cardiac Enzymes Recent Labs  Lab 04/10/18 0710  TROPONINI <0.03   ------------------------------------------------------------------------------------------------------------------  RADIOLOGY:  No results found.  EKG:   Orders placed or performed during the hospital encounter of 04/10/18  . ED EKG  . ED EKG  . EKG 12-Lead  . EKG 12-Lead    ASSESSMENT AND PLAN:   Sonya [name]  is a 85 y.o. female with a known history of stroke with left sided weakness on Eliquis, CAD, GERD, HTN, history of breast cancer who had 2 hospitalizations within the past month 1 for GI bleed and another last week for orthostatic hypotension and syncope is brought back again this morning secondary to dizziness and near syncopal episode after standing up.  1.  Orthostatic hypotension-no underlying infection has been identified. -IV fluids since  BUN and creatinine are elevated on admission-improved today..  Held some of her blood pressure medications -No orthostatic drop noted. -Concerning for left-sided vascular occlusions are partly responsible for her symptoms.  -Outpatient follow-up scheduled with 2 weeks. -Monitor on off unit telemetry  2.  Leukocytosis-has been persistent since the last couple of months.  -Improved WBC count. Platelets are within normal limits.  No source of infection identified.  3.  Acute renal failure-likely prerenal.    Improved with fluids and avoid nephrotoxins  4.  History of stroke and vascular occlusions-concern for paroxysmal A. fib.  Continue Eliquis at adjusted dose given her history of GI bleed  5.  Acute on chronic anemia-Baseline hemoglobin was around 10, admitted with possible lower GI bleed while on Eliquis, since then hemoglobin has been low at around 8.  This morning dropped to 7.2.  No active bleeding noted. -Iron labs ordered.  Also repeat hemoglobin today.  If continues to be low, will need to hold Eliquis at discharge. -EGD done 3 weeks ago showing minimal gastritis.  6.  DVT prophylaxis-patient is on Eliquis  Mostly bedbound at baseline.  PT/OT consult  Home palliative at discharge.  Family interested in taking her home.  Possible discharge today    All the records are reviewed and case discussed with Care Management/Social Workerr. Management plans discussed with the patient, family and they are in agreement.  CODE STATUS: DNR  TOTAL TIME TAKING CARE OF THIS PATIENT: 38 minutes.   POSSIBLE D/C IN 1-2 DAYS, DEPENDING ON CLINICAL CONDITION.   Gladstone Lighter M.D on 04/12/2018 at 9:12 AM  Between 7am to 6pm - Pager - [phone]  After 6pm go to [email or web address] - password [email or web address] [facility]  Office  [phone]  CC: Primary care physician; Baxter Hire, MD

## 2018-04-12 NOTE — Progress Notes (Signed)
Discharge instructions explained to pt's son and family/ verbalized an understanding/ iv and tele removed/ will transport off unit via wheelchair.

## 2018-04-12 NOTE — Plan of Care (Signed)
  Problem: Nutrition: Goal: Adequate nutrition will be maintained Outcome: Not Progressing   

## 2018-04-12 NOTE — Consult Note (Addendum)
[facility] Nurse wound consult note Reason for Consult:Trauma wound to left elbow, full thickness unstageable pressure injury to left lateral malleolus and left fifth toe near lateral foot aspect. All scabbed Wound type: trauma Pressure Injury POA: Yes Measurement: LEft elbow:  2 cm x 2 cm scabbed LEft lateral malleolus:  1 cm x 2 cm scabbed LEft fifth toe:  0.5 cm x 0.5 cm scabebd Wound JME:QASTMHD Drainage (amount, consistency, odor) scant serosanguinous  No odor Periwound:intact  Dry skin Dressingprocedure/placement/frequency: Cleanse wounds to left elbow, left malleolus and left fifth toe with soap and water.  Apply foam dressing and change every three days and PRN soilage.  Will not follow at this time.  Please re-consult if needed.  Domenic Moras MSN, RN, FNP-BC CWON Wound, Ostomy, Continence Nurse Pager [phone]

## 2018-04-12 NOTE — Care Management Note (Signed)
Case Management Note  Patient Details  Name: Sonya [name] MRN: [id number] Date of Birth: Jun 11, 1932  Subjective/Objective:         Patient is from home; [name] is staying with patient.  She is discharging today to home with son.  She is open to [facility].  Notified Sonya [name] with [facility] of patient DC.  She Does not have difficulty obtaining medications at [facility].  She is current with her PCP, Dr. Edwina [name].  SHe has a wheelchair, and bed similar to hospital bed.  [name] states they have all the equipment they need.  Will DC with son.  No further needs identified.     Expected Discharge Date:  04/12/18               Expected Discharge Plan:  [place]  In-House Referral:     Discharge planning Services  CM Consult  Post Acute Care Choice:    Choice offered to:  Adult Children  DME Arranged:    DME Agency:     HH Arranged:  RN, PT, OT, Nurse's Aide, Social Work [employer] Agency:  [employer] (now [facility])  Status of Service:  Completed, signed off  If discussed at [employer], dates discussed:    Additional Comments:  Sonya Rafter, RN 04/12/2018, 2:26 PM

## 2018-04-13 NOTE — Discharge Summary (Signed)
Sonya [name] at [place] NAME: Sonya [name]    MR#:  [id number]  DATE OF BIRTH:  [birth year]  DATE OF ADMISSION:  04/10/2018   ADMITTING PHYSICIAN: Gladstone Lighter, MD  DATE OF DISCHARGE: 04/12/2018  4:30 PM  PRIMARY CARE PHYSICIAN: Baxter Hire, MD   ADMISSION DIAGNOSIS:   AKI (acute kidney injury) ([place]) [N17.9] Gastrointestinal hemorrhage with melena [K92.1]  DISCHARGE DIAGNOSIS:   Active Problems:   Hypotension   SECONDARY DIAGNOSIS:   Past Medical History:  Diagnosis Date  . CAD (coronary artery disease)   . Carpal tunnel syndrome   . GERD (gastroesophageal reflux disease)   . History of shingles   . Hormone receptor positive breast cancer ([place])   . Hyperlipemia   . Hypertension   . Stroke [facility])    left sided weakness    HOSPITAL COURSE:   Sonya [name] a85 y.o.femalewith a known history of stroke with left sided weaknesson Eliquis, CAD, GERD, HTN,history of breast cancer who had 2 hospitalizations within the past month 1 for GI bleed and another last week for orthostatic hypotension and syncope is brought back again this morning secondary to dizziness and near syncopal episode after standing up.  1. Orthostatic hypotension-no underlying infection has been identified. -received IV fluids since admission,  BUN and creatinine-improved.  -No orthostatic drop noted. -Concerning for left-sided vascular occlusionsarepartly responsible for her symptoms.  -Sent CT angiogram showing significant left carotid, left vertebral occlusions.  Has outpatient vascular follow-up pain 2 weeks. -Outpatient follow-up scheduled  2. Leukocytosis-has been persistent since the last couple of months.  - Platelets are within normal limits. No source of infection identified.  3. Acute renal failure- prerenal.   Improved with fluids and avoid nephrotoxins  4. History of stroke and vascular occlusions-concern for  paroxysmal A. fib. Continue Eliquis at adjusted dose given her history of GI bleed  5.  Acute on chronic anemia-Baseline hemoglobin was around 10, admitted with possible lower GI bleed while on Eliquis, since then hemoglobin has been low at around 8.    No active bleeding noted. -Iron labs indicate significant iron deficiency.  Started her on oral iron tablets.  Received 1 dose of IV iron prior to discharge.  -EGD done 3 weeks ago showing minimal gastritis.  Mostlybedbound at baseline.  Home palliative at discharge.  Patient being discharged home today    DISCHARGE CONDITIONS:   Guarded  CONSULTS OBTAINED:   None  DRUG ALLERGIES:   Allergies  Allergen Reactions  . Clopidogrel Nausea And Vomiting     reported by [place] 11/24/13  . Omeprazole Other (See Comments)    Unknown reaction - reported by [place] 11/24/13   DISCHARGE MEDICATIONS:   Allergies as of 04/12/2018      Reactions   Clopidogrel Nausea And Vomiting    reported by [place] 11/24/13   Omeprazole Other (See Comments)   Unknown reaction - reported by [place] 11/24/13      Medication List    STOP taking these medications   amLODipine 5 MG tablet Commonly known as:  NORVASC   aspirin 325 MG EC tablet   b complex vitamins tablet   cloNIDine 0.2 MG tablet Commonly known as:  CATAPRES   hydrochlorothiazide 12.5 MG tablet Commonly known as:  HYDRODIURIL   lisinopril 5 MG tablet Commonly known as:  PRINIVIL,ZESTRIL   ondansetron 4 MG tablet Commonly known as:  ZOFRAN  potassium chloride SA 20 MEQ tablet Commonly known as:  K-DUR,KLOR-CON     TAKE these medications   apixaban 2.5 MG Tabs tablet Commonly known as:  ELIQUIS Take 1 tablet (2.5 mg total) by mouth 2 (two) times daily.   B-complex with vitamin C tablet Take 1 tablet by mouth daily.   bisacodyl 5 MG EC tablet Commonly known as:  DUCODYL Take 2  tablets (10 mg total) by mouth daily.   feeding supplement (ENSURE ENLIVE) Liqd Take 237 mLs by mouth 2 (two) times daily between meals.   feeding supplement (PRO-STAT SUGAR FREE 64) Liqd Take 30 mLs by mouth 2 (two) times daily.   ferrous sulfate 325 (65 FE) MG tablet Take 1 tablet (325 mg total) by mouth 2 (two) times daily with a meal.   gabapentin 100 MG capsule Commonly known as:  NEURONTIN Take 100 mg by mouth 3 (three) times daily.   HYDROcodone-acetaminophen 5-325 MG tablet Commonly known as:  NORCO/VICODIN Take 1 tablet by mouth every 6 (six) hours as needed for moderate pain or severe pain.   lansoprazole 30 MG capsule Commonly known as:  PREVACID Take 1 capsule (30 mg total) by mouth daily.   levothyroxine 25 MCG tablet Commonly known as:  SYNTHROID, LEVOTHROID Take 25 mcg by mouth daily before breakfast.   magnesium oxide 400 MG tablet Commonly known as:  MAG-OX Take 400 mg by mouth daily.   Melatonin 3 MG Tabs Take 3 mg by mouth at bedtime as needed (sleep).   metoprolol tartrate 25 MG tablet Commonly known as:  LOPRESSOR Take 1 tablet (25 mg total) by mouth 2 (two) times daily.   midodrine 2.5 MG tablet Commonly known as:  PROAMATINE Take 1 tablet (2.5 mg total) by mouth 2 (two) times daily with a meal.   ondansetron 4 MG disintegrating tablet Commonly known as:  ZOFRAN-ODT Take 1 tablet (4 mg total) by mouth every 8 (eight) hours as needed for nausea or vomiting.   polyethylene glycol packet Commonly known as:  MIRALAX / GLYCOLAX Take 17 g by mouth daily as needed for moderate constipation.   simvastatin 40 MG tablet Commonly known as:  ZOCOR Take 1 tablet (40 mg total) by mouth daily at 6 PM.   venlafaxine XR 150 MG 24 hr capsule Commonly known as:  EFFEXOR-XR Take 150 mg by mouth at bedtime.   Vitamin D3 25 MCG (1000 UT) Caps Take 3,000 Units by mouth at bedtime.        DISCHARGE INSTRUCTIONS:   1.  PCP follow-up in 1 to 2 weeks 2.   Vascular follow-up in 2 weeks  DIET:   Cardiac diet  ACTIVITY:   Activity as tolerated  OXYGEN:   Home Oxygen: No.  Oxygen Delivery: room air  DISCHARGE LOCATION:   home   If you experience worsening of your admission symptoms, develop shortness of breath, life threatening emergency, suicidal or homicidal thoughts you must seek medical attention immediately by calling 911 or calling your MD immediately  if symptoms less severe.  You Must read complete instructions/literature along with all the possible adverse reactions/side effects for all the Medicines you take and that have been prescribed to you. Take any new Medicines after you have completely understood and accpet all the possible adverse reactions/side effects.   Please note  You were cared for by a hospitalist during your hospital stay. If you have any questions about your discharge medications or the care you received while you were in the  hospital after you are discharged, you can call the unit and asked to speak with the hospitalist on call if the hospitalist that took care of you is not available. Once you are discharged, your primary care physician will handle any further medical issues. Please note that NO REFILLS for any discharge medications will be authorized once you are discharged, as it is imperative that you return to your primary care physician (or establish a relationship with a primary care physician if you do not have one) for your aftercare needs so that they can reassess your need for medications and monitor your lab values.    On the day of Discharge:  VITAL SIGNS:   Blood pressure (!) 124/49, pulse 66, temperature 97.7 F (36.5 C), temperature source Oral, resp. rate 18, height 5' (1.524 m), weight 43 kg, SpO2 97 %.  PHYSICAL EXAMINATION:   GENERAL:85 y.o.-year-old patient lying in the bed with no acute distress.  EYES: Pupils equal, round, reactive to light and accommodation. No scleral icterus.  Extraocular muscles intact.  HEENT: Head atraumatic, normocephalic. Oropharynx and nasopharynx clear.  NECK: Supple, no jugular venous distention. No thyroid enlargement, no tenderness.  LUNGS: Normal breath sounds bilaterally, no wheezing, rales,rhonchi or crepitation. No use of accessory muscles of respiration.Decreased bibasilar breath sounds CARDIOVASCULAR: S1, S2 normal. No rubs, or gallops. 2/6 systolic murmur present ABDOMEN: Soft, nontender, nondistended. Bowel sounds present. No organomegaly or mass.  EXTREMITIES: No pedal edema, cyanosis, or clubbing.  Swelling of the left arm including the wrist noted.  No concern for any fracture on exam NEUROLOGIC: Cranial nerves II through XII are intact. Muscle strength 5/5 inRUE and RLE, < 2/5 in LLE and 0/5 in LUE.Sensation intact. Gait not checked.  PSYCHIATRIC: The patient is alert and oriented x 3.  SKIN: No obvious rash, lesion, or ulcer.  DATA REVIEW:   CBC Recent Labs  Lab 04/12/18 0524 04/12/18 0852  WBC 11.8*  --   HGB 7.2* 7.8*  HCT 23.7*  --   PLT 319  --     Chemistries  Recent Labs  Lab 04/11/18 0424  NA 138  K 3.7  CL 108  CO2 22  GLUCOSE 78  BUN 20  CREATININE 0.86  CALCIUM 8.4*     Microbiology Results  Results for orders placed or performed during the hospital encounter of 03/26/18  MRSA PCR Screening     Status: Abnormal   Collection Time: 03/27/18  2:49 AM  Result Value Ref Range Status   MRSA by PCR POSITIVE (A) NEGATIVE Final    Comment:        The GeneXpert MRSA Assay (FDA approved for NASAL specimens only), is one component of a comprehensive MRSA colonization surveillance program. It is not intended to diagnose MRSA infection nor to guide or monitor treatment for MRSA infections. RESULT CALLED TO, READ BACK BY AND VERIFIED WITH: TEMEKA COLES ON 03/27/2018 AT 0423 QSD Performed at [facility], [address] [place], [place] [address]     RADIOLOGY:  No results  found.   Management plans discussed with the patient, family and they are in agreement.  CODE STATUS:  Code Status History    Date Active Date Inactive Code Status Order ID Comments User Context   04/10/2018 1038 04/12/2018 1935 DNR [id number]  Gladstone Lighter, MD ED   03/26/2018 1752 04/01/2018 1834 DNR [id number]  Vaughan Basta, MD Inpatient   03/22/2018 1536 03/24/2018 1438 Full Code [id number]  Fritzi Mandes, MD Inpatient   02/21/2018  1200 02/24/2018 1921 Full Code [id number]  Hillary Bow, MD ED   11/06/2017 0040 11/16/2017 2146 DNR [id number]  Lance Coon, MD Inpatient   10/30/2017 1648 11/03/2017 1703 DNR [id number]  Dustin Flock, MD Inpatient   10/12/2017 2234 10/15/2017 2130 Full Code [id number]  Vianne Bulls, MD ED    Questions for Most Recent Historical Code Status (Order [id number])    Question Answer Comment   In the event of cardiac or respiratory ARREST Do not call a "code blue"    In the event of cardiac or respiratory ARREST Do not perform Intubation, CPR, defibrillation or ACLS    In the event of cardiac or respiratory ARREST Use medication by any route, position, wound care, and other measures to relive pain and suffering. May use oxygen, suction and manual treatment of airway obstruction as needed for comfort.       TOTAL TIME TAKING CARE OF THIS PATIENT: 38 minutes.    Gladstone Lighter M.D on 04/13/2018 at 3:15 PM  Between 7am to 6pm - Pager - [phone]  After 6pm go to [email or web address] - Proofreader  [facility]  Office  [phone]  CC: Primary care physician; Baxter Hire, MD   Note: This dictation was prepared with Dragon dictation along with smaller phrase technology. Any transcriptional errors that result from this process are unintentional.

## 2018-04-23 ENCOUNTER — Encounter (INDEPENDENT_AMBULATORY_CARE_PROVIDER_SITE_OTHER): Payer: Self-pay | Admitting: Nurse Practitioner

## 2018-04-23 ENCOUNTER — Other Ambulatory Visit: Payer: Self-pay

## 2018-04-23 ENCOUNTER — Ambulatory Visit (INDEPENDENT_AMBULATORY_CARE_PROVIDER_SITE_OTHER): Payer: Medicare PPO | Admitting: Nurse Practitioner

## 2018-04-23 VITALS — BP 136/86 | HR 84 | Resp 16 | Ht 60.0 in | Wt 105.0 lb

## 2018-04-23 DIAGNOSIS — K219 Gastro-esophageal reflux disease without esophagitis: Secondary | ICD-10-CM | POA: Diagnosis not present

## 2018-04-23 DIAGNOSIS — Z7902 Long term (current) use of antithrombotics/antiplatelets: Secondary | ICD-10-CM

## 2018-04-23 DIAGNOSIS — Z87891 Personal history of nicotine dependence: Secondary | ICD-10-CM

## 2018-04-23 DIAGNOSIS — I6523 Occlusion and stenosis of bilateral carotid arteries: Secondary | ICD-10-CM | POA: Diagnosis not present

## 2018-04-23 DIAGNOSIS — Z7982 Long term (current) use of aspirin: Secondary | ICD-10-CM | POA: Diagnosis not present

## 2018-04-23 DIAGNOSIS — I10 Essential (primary) hypertension: Secondary | ICD-10-CM | POA: Diagnosis not present

## 2018-04-23 DIAGNOSIS — Z79899 Other long term (current) drug therapy: Secondary | ICD-10-CM

## 2018-04-25 ENCOUNTER — Encounter (INDEPENDENT_AMBULATORY_CARE_PROVIDER_SITE_OTHER): Payer: Self-pay | Admitting: Nurse Practitioner

## 2018-04-25 DIAGNOSIS — I6529 Occlusion and stenosis of unspecified carotid artery: Secondary | ICD-10-CM | POA: Insufficient documentation

## 2018-04-25 NOTE — Progress Notes (Signed)
SUBJECTIVE:  Patient ID: Sonya [name], female    DOB: 03/17/1932, 85 y.o.   MRN: [id number] Chief Complaint  Patient presents with  . Follow-up    [facility] f/u    HPI  Sonya [name] is a 85 y.o. female The patient is seen for follow up evaluation of carotid stenosis status post Carotid angiogram. CT scan was done 03/31/2017. Patient reports that the test went well with no problems or complications.   The patient denies interval amaurosis fugax. There is no recent or interval TIA symptoms or focal motor deficits. There is no prior documented CVA.  The patient is taking enteric-coated aspirin 81 mg daily.  There is no history of migraine headaches. There is no history of seizures.  The patient has a history of coronary artery disease, no recent episodes of angina or shortness of breath. The patient denies PAD or claudication symptoms. There is a history of hyperlipidemia which is being treated with a statin.    Carotid angiogram angiogram is reviewed by me personally and shows 85-90% stenosis consistent with calcified plaque at the origin of the left cervical carotid artery.   Past Medical History:  Diagnosis Date  . CAD (coronary artery disease)   . Carpal tunnel syndrome   . GERD (gastroesophageal reflux disease)   . History of shingles   . Hormone receptor positive breast cancer ([place])   . Hyperlipemia   . Hypertension   . Stroke [facility])    left sided weakness    Past Surgical History:  Procedure Laterality Date  . ABDOMINAL HYSTERECTOMY    . APPENDECTOMY    . CAROTID ANGIOGRAPHY Bilateral 03/31/2018   Procedure: CAROTID ANGIOGRAPHY;  Surgeon: Algernon Huxley, MD;  Location: [facility];  Service: Cardiovascular;  Laterality: Bilateral;  . cornoary angioplasty    . ENDOSCOPIC RETROGRADE CHOLANGIOPANCREATOGRAPHY (ERCP) WITH PROPOFOL N/A 11/10/2017   Procedure: ENDOSCOPIC RETROGRADE CHOLANGIOPANCREATOGRAPHY (ERCP) WITH PROPOFOL;  Surgeon: Lucilla Lame, MD;  Location:  [facility] ENDOSCOPY;  Service: Endoscopy;  Laterality: N/A;  . ESOPHAGOGASTRODUODENOSCOPY N/A 03/23/2018   Procedure: ESOPHAGOGASTRODUODENOSCOPY (EGD);  Surgeon: Lin Landsman, MD;  Location: [facility] ENDOSCOPY;  Service: Gastroenterology;  Laterality: N/A;  . ESOPHAGOGASTRODUODENOSCOPY (EGD) WITH PROPOFOL N/A 11/06/2017   Procedure: ESOPHAGOGASTRODUODENOSCOPY (EGD) WITH PROPOFOL;  Surgeon: Lucilla Lame, MD;  Location: [facility] ENDOSCOPY;  Service: Endoscopy;  Laterality: N/A;    Social History   Socioeconomic History  . Marital status: Married    Spouse name: Not on file  . Number of children: Not on file  . Years of education: Not on file  . Highest education level: Not on file  Occupational History  . Not on file  Social Needs  . Financial resource strain: Not on file  . Food insecurity:    Worry: Not on file    Inability: Not on file  . Transportation needs:    Medical: Not on file    Non-medical: Not on file  Tobacco Use  . Smoking status: Former Smoker    Packs/day: 0.50    Years: 20.00    Pack years: 10.00    Types: Cigarettes    Last attempt to quit: 02/17/1998    Years since quitting: 20.1  . Smokeless tobacco: Never Used  Substance and Sexual Activity  . Alcohol use: Not Currently  . Drug use: Never  . Sexual activity: Not on file  Lifestyle  . Physical activity:    Days per week: Not on file    Minutes per session: Not on  file  . Stress: Not on file  Relationships  . Social connections:    Talks on phone: Not on file    Gets together: Not on file    Attends religious service: Not on file    Active member of club or organization: Not on file    Attends meetings of clubs or organizations: Not on file    Relationship status: Not on file  . Intimate partner violence:    Fear of current or ex partner: Not on file    Emotionally abused: Not on file    Physically abused: Not on file    Forced sexual activity: Not on file  Other Topics Concern  . Not on file  Social  History Narrative   Currently at home, bedbound mostly, son helping    Family History  Problem Relation Age of Onset  . Hypertension Son   . Ovarian cancer Mother   . Stroke Father   . Stroke Son     Allergies  Allergen Reactions  . Clopidogrel Nausea And Vomiting     reported by [place] 11/24/13  . Omeprazole Other (See Comments)    Unknown reaction - reported by [place] 11/24/13     Review of Systems   Review of Systems: Negative Unless Checked Constitutional: [] Weight loss  [] Fever  [] Chills Cardiac: [] Chest pain   []  Atrial Fibrillation  [] Palpitations   [] Shortness of breath when laying flat   [] Shortness of breath with exertion. [] Shortness of breath at rest Vascular:  [] Pain in legs with walking   [] Pain in legs with standing [] Pain in legs when laying flat   [] Claudication    [] Pain in feet when laying flat    [] History of DVT   [] Phlebitis   [] Swelling in legs   [] Varicose veins   [] Non-healing ulcers Pulmonary:   [] Uses home oxygen   [] Productive cough   [] Hemoptysis   [] Wheeze  [] COPD   [] Asthma Neurologic:  [] Dizziness   [] Seizures  [] Blackouts [x] History of stroke   [] History of TIA  [] Aphasia   [] Temporary Blindness   [] Weakness or numbness in arm   [x] Weakness or numbness in leg Musculoskeletal:   [] Joint swelling   [] Joint pain   [] Low back pain  []  History of Knee Replacement [] Arthritis [] back Surgeries  []  Spinal Stenosis    Hematologic:  [] Easy bruising  [] Easy bleeding   [] Hypercoagulable state   [] Anemic Gastrointestinal:  [] Diarrhea   [] Vomiting  [] Gastroesophageal reflux/heartburn   [] Difficulty swallowing. [] Abdominal pain Genitourinary:  [] Chronic kidney disease   [] Difficult urination  [] Anuric   [] Blood in urine [] Frequent urination  [] Burning with urination   [] Hematuria Skin:  [] Rashes   [] Ulcers [] Wounds Psychological:  [] History of anxiety   []  History of major depression  []  Memory Difficulties       OBJECTIVE:   Physical Exam  BP 136/86   Pulse 84   Resp 16   Ht 5' (1.524 m)   Wt 105 lb (47.6 kg)   BMI 20.51 kg/m   Gen: WD/WN, NAD Head: [place]/AT, No temporalis wasting.  Ear/Nose/Throat: Hearing grossly intact, nares w/o erythema or drainage Eyes: PER, EOMI, sclera nonicteric.  Neck: Supple, no masses.  No JVD.  Pulmonary:  Good air movement, no use of accessory muscles.  Cardiac: RRR Vascular:  Vessel Right Left  Radial Palpable Palpable   Gastrointestinal: soft, non-distended. No guarding/no peritoneal signs.  Musculoskeletal: left sided weakness.  No deformity or atrophy.  Neurologic:  Speech is fluent. Motor exam as listed above. Psychiatric: Judgment intact, Mood & affect appropriate for pt's clinical situation. Dermatologic: No Venous rashes. No Ulcers Noted.  No changes consistent with cellulitis. Lymph : No Cervical lymphadenopathy, no lichenification or skin changes of chronic lymphedema.       ASSESSMENT AND PLAN:  1. Bilateral carotid artery stenosis Recommend:  The patient remains asymptomatic with respect to the carotid stenosis.  However, the patient has now progressed and has a lesion the is >75%.  Patient's carotid angiogram of the carotid arteries confirms >75% left cervical carotid stenosis.  The anatomical considerations support surgery over stenting.  This was discussed in detail with the patient.  The patient does indeed need surgery, therefore, cardiac clearance will be arranged. Once cleared the patient will be scheduled for surgery.  The risks, benefits and alternative therapies were reviewed in detail with the patient.  All questions were answered.  The patient agrees to proceed with surgery of the left cervical carotid artery.  Continue antiplatelet therapy as prescribed. Continue management of CAD, HTN and Hyperlipidemia. Healthy heart diet, encouraged exercise at least 4 times per week.    2. Essential hypertension Continue  antihypertensive medications as already ordered, these medications have been reviewed and there are no changes at this time.   3. Gastroesophageal reflux disease, esophagitis presence not specified Continue PPI as already ordered, this medication has been reviewed and there are no changes at this time.  Avoidence of caffeine and alcohol  Moderate elevation of the head of the bed    Current Outpatient Medications on File Prior to Visit  Medication Sig Dispense Refill  . Amino Acids-Protein Hydrolys (FEEDING SUPPLEMENT, PRO-STAT SUGAR FREE 64,) LIQD Take 30 mLs by mouth 2 (two) times daily. 60 Bottle 0  . apixaban (ELIQUIS) 2.5 MG TABS tablet Take 1 tablet (2.5 mg total) by mouth 2 (two) times daily. 60 tablet 0  . bisacodyl (DUCODYL) 5 MG EC tablet Take 2 tablets (10 mg total) by mouth daily. 30 tablet 0  . Cholecalciferol (VITAMIN D3) 1000 units CAPS Take 3,000 Units by mouth at bedtime.     . ferrous sulfate 325 (65 FE) MG tablet Take 1 tablet (325 mg total) by mouth 2 (two) times daily with a meal. 60 tablet 3  . gabapentin (NEURONTIN) 100 MG capsule Take 100 mg by mouth 3 (three) times daily.    [place] HYDROcodone-acetaminophen (NORCO/VICODIN) 5-325 MG tablet Take 1 tablet by mouth every 6 (six) hours as needed for moderate pain or severe pain. 30 tablet 0  . lansoprazole (PREVACID) 30 MG capsule Take 1 capsule (30 mg total) by mouth daily. 30 capsule 1  . levothyroxine (SYNTHROID, LEVOTHROID) 25 MCG tablet Take 25 mcg by mouth daily before breakfast.     . magnesium oxide (MAG-OX) 400 MG tablet Take 400 mg by mouth daily.    . Melatonin 3 MG TABS Take 3 mg by mouth at bedtime as needed (sleep).     . metoprolol tartrate (LOPRESSOR) 25 MG tablet Take 1 tablet (25 mg total) by mouth 2 (two) times daily. 60 tablet 0  . ondansetron (ZOFRAN-ODT) 4 MG disintegrating tablet Take 1 tablet (4 mg total) by mouth every 8 (eight) hours as needed for nausea or vomiting. 20 tablet 0  . polyethylene glycol  (MIRALAX / GLYCOLAX) packet Take 17 g by mouth daily as needed for moderate constipation. 30 each 0  . simvastatin (ZOCOR) 40 MG tablet Take 1 tablet (40 mg total) by  mouth daily at 6 PM. 30 tablet 0  . venlafaxine XR (EFFEXOR-XR) 150 MG 24 hr capsule Take 150 mg by mouth at bedtime.    . B Complex-C (B-COMPLEX WITH VITAMIN C) tablet Take 1 tablet by mouth daily. (Patient not taking: Reported on 04/23/2018)    . feeding supplement, ENSURE ENLIVE, (ENSURE ENLIVE) LIQD Take 237 mLs by mouth 2 (two) times daily between meals. (Patient not taking: Reported on 04/23/2018) 60 Bottle 12  . midodrine (PROAMATINE) 2.5 MG tablet Take 1 tablet (2.5 mg total) by mouth 2 (two) times daily with a meal. 60 tablet 1   No current facility-administered medications on file prior to visit.     There are no Patient Instructions on file for this visit. No follow-ups on file.   Kris Hartmann, NP  This note was completed with Sales executive.  Any errors are purely unintentional.

## 2018-04-28 ENCOUNTER — Other Ambulatory Visit: Payer: Self-pay | Admitting: Cardiology

## 2018-04-28 DIAGNOSIS — R079 Chest pain, unspecified: Secondary | ICD-10-CM

## 2018-05-05 ENCOUNTER — Other Ambulatory Visit: Payer: Self-pay

## 2018-05-05 ENCOUNTER — Encounter
Admission: RE | Admit: 2018-05-05 | Discharge: 2018-05-05 | Disposition: A | Discharge status: Home / Self Care | Payer: Medicare PPO | Source: Ambulatory Visit | Attending: Cardiology | Admitting: Cardiology

## 2018-05-05 DIAGNOSIS — R079 Chest pain, unspecified: Secondary | ICD-10-CM | POA: Diagnosis present

## 2018-05-05 LAB — NM MYOCAR MULTI W/SPECT W/WALL MOTION / EF
CHL CUP MPHR: 137 {beats}/min
Estimated workload: 1 METS
Exercise duration (min): 1 min
Exercise duration (sec): 0 s
LV dias vol: 38 mL (ref 46–106)
LV sys vol: 14 mL
Peak HR: 120 {beats}/min
Percent HR: 87 %
Rest HR: 778 {beats}/min
SDS: 0
SRS: 1
SSS: 10
TID: 2.67

## 2018-05-05 MED ORDER — REGADENOSON 0.4 MG/5ML IV SOLN
0.4000 mg | Freq: Once | INTRAVENOUS | Status: AC
  Administered 2018-05-05: 0.4 mg via INTRAVENOUS

## 2018-05-05 MED ORDER — TECHNETIUM TC 99M TETROFOSMIN IV KIT
30.0000 | PACK | Freq: Once | INTRAVENOUS | Status: AC | PRN
  Administered 2018-05-05: 29.87 via INTRAVENOUS

## 2018-05-05 MED ORDER — TECHNETIUM TC 99M TETROFOSMIN IV KIT
10.0100 | PACK | Freq: Once | INTRAVENOUS | Status: AC | PRN
  Administered 2018-05-05: 10.01 via INTRAVENOUS

## 2018-05-11 ENCOUNTER — Other Ambulatory Visit (INDEPENDENT_AMBULATORY_CARE_PROVIDER_SITE_OTHER): Payer: Self-pay | Admitting: Nurse Practitioner

## 2018-05-12 ENCOUNTER — Encounter
Admission: RE | Admit: 2018-05-12 | Discharge: 2018-05-12 | Disposition: A | Discharge status: Home / Self Care | Payer: Medicare PPO | Source: Ambulatory Visit | Attending: Vascular Surgery | Admitting: Vascular Surgery

## 2018-05-12 ENCOUNTER — Other Ambulatory Visit: Payer: Self-pay

## 2018-05-12 DIAGNOSIS — I6529 Occlusion and stenosis of unspecified carotid artery: Secondary | ICD-10-CM | POA: Insufficient documentation

## 2018-05-12 DIAGNOSIS — Z01818 Encounter for other preprocedural examination: Secondary | ICD-10-CM | POA: Insufficient documentation

## 2018-05-12 HISTORY — DX: Hypothyroidism, unspecified: E03.9

## 2018-05-12 LAB — TYPE AND SCREEN
ABO/RH(D): A POS
Antibody Screen: NEGATIVE

## 2018-05-12 LAB — BASIC METABOLIC PANEL
Anion gap: 9 (ref 5–15)
BUN: 17 mg/dL (ref 8–23)
CO2: 28 mmol/L (ref 22–32)
Calcium: 9.4 mg/dL (ref 8.9–10.3)
Chloride: 102 mmol/L (ref 98–111)
Creatinine, Ser: 0.82 mg/dL (ref 0.44–1.00)
GFR calc Af Amer: >60 mL/min (ref >60)
GFR calc non Af Amer: >60 mL/min (ref >60)
Glucose, Bld: 137 mg/dL — ABNORMAL HIGH (ref 70–99)
Potassium: 3.4 mmol/L — ABNORMAL LOW (ref 3.5–5.1)
Sodium: 139 mmol/L (ref 135–145)

## 2018-05-12 LAB — APTT: aPTT: 35 seconds (ref 24–36)

## 2018-05-12 LAB — CBC WITH DIFFERENTIAL/PLATELET
Abs Immature Granulocytes: 0.04 10*3/uL (ref 0.00–0.07)
BASOS ABS: 0 10*3/uL (ref 0.0–0.1)
Basophils Relative: 0 %
EOS PCT: 1 %
Eosinophils Absolute: 0.1 10*3/uL (ref 0.0–0.5)
HCT: 40 % (ref 36.0–46.0)
Hemoglobin: 11.9 g/dL — ABNORMAL LOW (ref 12.0–15.0)
Immature Granulocytes: 0 %
Lymphocytes Relative: 26 %
Lymphs Abs: 2.4 10*3/uL (ref 0.7–4.0)
MCH: 26.4 pg (ref 26.0–34.0)
MCHC: 29.8 g/dL — ABNORMAL LOW (ref 30.0–36.0)
MCV: 88.9 fL (ref 80.0–100.0)
Monocytes Absolute: 0.6 10*3/uL (ref 0.1–1.0)
Monocytes Relative: 7 %
NRBC: 0 % (ref 0.0–0.2)
Neutro Abs: 5.9 10*3/uL (ref 1.7–7.7)
Neutrophils Relative %: 66 %
Platelets: 396 10*3/uL (ref 150–400)
RBC: 4.5 MIL/uL (ref 3.87–5.11)
RDW: 16.6 % — ABNORMAL HIGH (ref 11.5–15.5)
WBC: 9.1 10*3/uL (ref 4.0–10.5)

## 2018-05-12 LAB — PROTIME-INR
INR: 1.1 (ref 0.8–1.2)
Prothrombin Time: 14.3 seconds (ref 11.4–15.2)

## 2018-05-12 NOTE — Patient Instructions (Addendum)
  Your procedure is scheduled on: Wednesday May 19, 2018 Report to Same Day Surgery 2nd floor [facility] Entrance-take elevator on left to 2nd floor.  Check in with surgery information desk.) To find out your arrival time, call [phone] 1:00-3:00 PM on Tuesday May 18, 2018  Remember: Instructions that are not followed completely may result in serious medical risk, up to and including death, or upon the discretion of your surgeon and anesthesiologist your surgery may need to be rescheduled.    __x__ 1. Do not eat food (including mints, candies, chewing gum) after midnight the night before your procedure. You may drink water up to 2 hours before you are scheduled to arrive at the hospital for your procedure.  Do not drink anything within 2 hours of your scheduled arrival to the hospital.   __x__ 2. No Alcohol for 24 hours before or after surgery.   __x__ 3. No Smoking or e-cigarettes for 24 hours before surgery.  Do not use any chewable tobacco products for at least 6 hours before surgery.   __x__ 4. Notify your doctor if there is any change in your medical condition (cold, fever, infections).   __x__ 5. On the morning of surgery brush your teeth with toothpaste and water.  You may rinse your mouth with mouthwash if you wish.  Do not swallow any toothpaste or mouthwash.  Please read over the following fact sheets that you were given:   [facility] Preparing for Surgery and/or MRSA Information    __x__ Use CHG wipes as directed on instruction sheet.    Do not wear jewelry, make-up, hairpins, clips or nail polish on the day of surgery.  Do not wear lotions, powders, deodorant, or perfumes.   Do not shave 48 hours prior to surgery.   Do not bring valuables to the hospital.    [facility] is not responsible for any belongings or valuables.  For patients admitted to the hospital, discharge time is determined by your treatment team.  __x__ Take these medications on the  morning of surgery with a SMALL SIP OF WATER:  1. Metoprolol/Lopressor  2. Gabapentin/Neurontin  3. Lansoprazole/Prevacid  4. Levothyroxine/Synthroid)  5. Midodrine (if can tolerate without food)  __x__ Follow recommendations from Cardiologist, Pulmonologist or PCP regarding stopping Aspirin, Coumadin, Plavix, Eliquis, Effient, Pradaxa, and Pletal.  Do not take any anti-inflammatory medications or herbal supplements until after surgery.

## 2018-05-18 MED ORDER — CEFAZOLIN SODIUM-DEXTROSE 2-4 GM/100ML-% IV SOLN
2.0000 g | INTRAVENOUS | Status: AC
  Administered 2018-05-19: 15:00:00 2 g via INTRAVENOUS

## 2018-05-19 ENCOUNTER — Inpatient Hospital Stay: Payer: Medicare PPO | Admitting: Anesthesiology

## 2018-05-19 ENCOUNTER — Encounter: Payer: Self-pay | Admitting: Emergency Medicine

## 2018-05-19 ENCOUNTER — Other Ambulatory Visit: Payer: Self-pay

## 2018-05-19 ENCOUNTER — Encounter
Admission: RE | Disposition: A | Discharge status: Home / Self Care | Payer: Self-pay | Source: Home / Self Care | Attending: Vascular Surgery

## 2018-05-19 ENCOUNTER — Inpatient Hospital Stay
Admission: RE | Admit: 2018-05-19 | Discharge: 2018-05-20 | DRG: 038 | Disposition: A | Discharge status: Home / Self Care | Payer: Medicare PPO | Attending: Vascular Surgery | Admitting: Vascular Surgery

## 2018-05-19 DIAGNOSIS — I69398 Other sequelae of cerebral infarction: Secondary | ICD-10-CM

## 2018-05-19 DIAGNOSIS — Z853 Personal history of malignant neoplasm of breast: Secondary | ICD-10-CM

## 2018-05-19 DIAGNOSIS — E039 Hypothyroidism, unspecified: Secondary | ICD-10-CM | POA: Diagnosis present

## 2018-05-19 DIAGNOSIS — I2510 Atherosclerotic heart disease of native coronary artery without angina pectoris: Secondary | ICD-10-CM | POA: Diagnosis present

## 2018-05-19 DIAGNOSIS — G5600 Carpal tunnel syndrome, unspecified upper limb: Secondary | ICD-10-CM | POA: Diagnosis present

## 2018-05-19 DIAGNOSIS — I69354 Hemiplegia and hemiparesis following cerebral infarction affecting left non-dominant side: Secondary | ICD-10-CM | POA: Diagnosis not present

## 2018-05-19 DIAGNOSIS — E785 Hyperlipidemia, unspecified: Secondary | ICD-10-CM | POA: Diagnosis present

## 2018-05-19 DIAGNOSIS — Z8619 Personal history of other infectious and parasitic diseases: Secondary | ICD-10-CM | POA: Diagnosis not present

## 2018-05-19 DIAGNOSIS — R531 Weakness: Secondary | ICD-10-CM | POA: Diagnosis not present

## 2018-05-19 DIAGNOSIS — K219 Gastro-esophageal reflux disease without esophagitis: Secondary | ICD-10-CM | POA: Diagnosis present

## 2018-05-19 DIAGNOSIS — R55 Syncope and collapse: Secondary | ICD-10-CM

## 2018-05-19 DIAGNOSIS — I6522 Occlusion and stenosis of left carotid artery: Secondary | ICD-10-CM | POA: Diagnosis present

## 2018-05-19 DIAGNOSIS — I10 Essential (primary) hypertension: Secondary | ICD-10-CM | POA: Diagnosis present

## 2018-05-19 DIAGNOSIS — R410 Disorientation, unspecified: Secondary | ICD-10-CM

## 2018-05-19 HISTORY — PX: PATCH ANGIOPLASTY: SHX6230

## 2018-05-19 HISTORY — PX: ENDARTERECTOMY: SHX5162

## 2018-05-19 LAB — GLUCOSE, CAPILLARY: Glucose-Capillary: 96 mg/dL (ref 70–99)

## 2018-05-19 LAB — MRSA PCR SCREENING: MRSA by PCR: NEGATIVE

## 2018-05-19 SURGERY — ENDARTERECTOMY, CAROTID
Anesthesia: General | Site: Neck | Laterality: Left

## 2018-05-19 MED ORDER — NITROGLYCERIN IN D5W 200-5 MCG/ML-% IV SOLN: 5.0000 ug/min | INTRAVENOUS | Status: DC

## 2018-05-19 MED ORDER — VITAMIN D 25 MCG (1000 UNIT) PO TABS
3000.0000 [IU] | ORAL_TABLET | Freq: Every day | ORAL | Status: DC
  Administered 2018-05-19: 23:00:00 3000 [IU] via ORAL
  Filled 2018-05-19: qty 3

## 2018-05-19 MED ORDER — ACETAMINOPHEN 325 MG PO TABS: 325.0000 mg | ORAL_TABLET | ORAL | Status: DC | PRN

## 2018-05-19 MED ORDER — PROPOFOL 10 MG/ML IV BOLUS
INTRAVENOUS | Status: AC
  Filled 2018-05-19: qty 20

## 2018-05-19 MED ORDER — LABETALOL HCL 5 MG/ML IV SOLN
INTRAVENOUS | Status: DC | PRN
  Administered 2018-05-19 (×3): 5 mg via INTRAVENOUS

## 2018-05-19 MED ORDER — METOPROLOL TARTRATE 5 MG/5ML IV SOLN: 2.0000 mg | INTRAVENOUS | Status: DC | PRN

## 2018-05-19 MED ORDER — BISACODYL 5 MG PO TBEC
10.0000 mg | DELAYED_RELEASE_TABLET | Freq: Every day | ORAL | Status: DC
  Filled 2018-05-19: qty 2

## 2018-05-19 MED ORDER — SODIUM CHLORIDE 0.9 % IV SOLN
INTRAVENOUS | Status: DC | PRN
  Administered 2018-05-19: 15:00:00 100 mL via INTRAMUSCULAR

## 2018-05-19 MED ORDER — B COMPLEX-C PO TABS
1.0000 | ORAL_TABLET | Freq: Every day | ORAL | Status: DC
  Administered 2018-05-20: 08:00:00 1 via ORAL
  Filled 2018-05-19: qty 1

## 2018-05-19 MED ORDER — SODIUM CHLORIDE 0.9 % IV SOLN: Freq: Once | INTRAVENOUS | Status: DC

## 2018-05-19 MED ORDER — PANTOPRAZOLE SODIUM 20 MG PO TBEC
20.0000 mg | DELAYED_RELEASE_TABLET | Freq: Every day | ORAL | Status: DC
  Filled 2018-05-19: qty 1

## 2018-05-19 MED ORDER — MIDODRINE HCL 5 MG PO TABS
2.5000 mg | ORAL_TABLET | Freq: Two times a day (BID) | ORAL | Status: DC
  Filled 2018-05-19: qty 0.5

## 2018-05-19 MED ORDER — FENTANYL CITRATE (PF) 100 MCG/2ML IJ SOLN
INTRAMUSCULAR | Status: DC | PRN
  Administered 2018-05-19 (×2): 50 ug via INTRAVENOUS

## 2018-05-19 MED ORDER — ROCURONIUM BROMIDE 100 MG/10ML IV SOLN
INTRAVENOUS | Status: DC | PRN
  Administered 2018-05-19: 30 mg via INTRAVENOUS

## 2018-05-19 MED ORDER — LACTATED RINGERS IV SOLN
INTRAVENOUS | Status: DC
  Administered 2018-05-19: 12:00:00 via INTRAVENOUS

## 2018-05-19 MED ORDER — POTASSIUM CHLORIDE CRYS ER 20 MEQ PO TBCR: 20.0000 meq | EXTENDED_RELEASE_TABLET | Freq: Every day | ORAL | Status: DC | PRN

## 2018-05-19 MED ORDER — MAGNESIUM OXIDE 400 (241.3 MG) MG PO TABS
400.0000 mg | ORAL_TABLET | Freq: Every day | ORAL | Status: DC
  Administered 2018-05-20: 08:00:00 400 mg via ORAL
  Filled 2018-05-19: qty 1

## 2018-05-19 MED ORDER — ENSURE ENLIVE PO LIQD
237.0000 mL | Freq: Two times a day (BID) | ORAL | Status: DC
  Administered 2018-05-20: 08:00:00 237 mL via ORAL

## 2018-05-19 MED ORDER — SIMVASTATIN 40 MG PO TABS
40.0000 mg | ORAL_TABLET | Freq: Every day | ORAL | Status: DC
  Filled 2018-05-19: qty 1

## 2018-05-19 MED ORDER — LEVOTHYROXINE SODIUM 25 MCG PO TABS
25.0000 ug | ORAL_TABLET | Freq: Every day | ORAL | Status: DC
  Administered 2018-05-20: 25 ug via ORAL
  Filled 2018-05-19 (×3): qty 1

## 2018-05-19 MED ORDER — DEXAMETHASONE SODIUM PHOSPHATE 10 MG/ML IJ SOLN
INTRAMUSCULAR | Status: AC
  Filled 2018-05-19: qty 1

## 2018-05-19 MED ORDER — SUCCINYLCHOLINE CHLORIDE 20 MG/ML IJ SOLN
INTRAMUSCULAR | Status: AC
  Filled 2018-05-19: qty 1

## 2018-05-19 MED ORDER — METOPROLOL TARTRATE 25 MG PO TABS
25.0000 mg | ORAL_TABLET | Freq: Two times a day (BID) | ORAL | Status: DC
  Administered 2018-05-19 – 2018-05-20 (×2): 25 mg via ORAL
  Filled 2018-05-19 (×2): qty 1

## 2018-05-19 MED ORDER — ASPIRIN EC 81 MG PO TBEC
81.0000 mg | DELAYED_RELEASE_TABLET | Freq: Every day | ORAL | Status: DC
  Administered 2018-05-19 – 2018-05-20 (×2): 81 mg via ORAL
  Filled 2018-05-19 (×2): qty 1

## 2018-05-19 MED ORDER — ESMOLOL HCL-SODIUM CHLORIDE 2000 MG/100ML IV SOLN
25.0000 ug/kg/min | INTRAVENOUS | Status: DC
  Filled 2018-05-19: qty 100

## 2018-05-19 MED ORDER — DOCUSATE SODIUM 100 MG PO CAPS
100.0000 mg | ORAL_CAPSULE | Freq: Every day | ORAL | Status: DC
  Administered 2018-05-20 (×2): 100 mg via ORAL
  Filled 2018-05-19: qty 1

## 2018-05-19 MED ORDER — MORPHINE SULFATE (PF) 4 MG/ML IV SOLN: 2.0000 mg | INTRAVENOUS | Status: DC | PRN

## 2018-05-19 MED ORDER — EPHEDRINE SULFATE 50 MG/ML IJ SOLN
INTRAMUSCULAR | Status: DC | PRN
  Administered 2018-05-19: 15 mg via INTRAVENOUS

## 2018-05-19 MED ORDER — CEFAZOLIN SODIUM-DEXTROSE 2-4 GM/100ML-% IV SOLN
2.0000 g | Freq: Three times a day (TID) | INTRAVENOUS | Status: AC
  Administered 2018-05-19 – 2018-05-20 (×2): 2 g via INTRAVENOUS
  Filled 2018-05-19 (×2): qty 100

## 2018-05-19 MED ORDER — LIDOCAINE HCL 1 % IJ SOLN
INTRAMUSCULAR | Status: DC | PRN
  Administered 2018-05-19: 10 mL

## 2018-05-19 MED ORDER — MELATONIN 5 MG PO TABS
5.0000 mg | ORAL_TABLET | Freq: Every evening | ORAL | Status: DC | PRN
  Filled 2018-05-19: qty 1

## 2018-05-19 MED ORDER — PHENOL 1.4 % MT LIQD
1.0000 | OROMUCOSAL | Status: DC | PRN
  Filled 2018-05-19: qty 177

## 2018-05-19 MED ORDER — DEXAMETHASONE SODIUM PHOSPHATE 10 MG/ML IJ SOLN
INTRAMUSCULAR | Status: DC | PRN
  Administered 2018-05-19: 5 mg via INTRAVENOUS

## 2018-05-19 MED ORDER — ALUM & MAG HYDROXIDE-SIMETH 200-200-20 MG/5ML PO SUSP: 15.0000 mL | ORAL | Status: DC | PRN

## 2018-05-19 MED ORDER — FENTANYL CITRATE (PF) 100 MCG/2ML IJ SOLN
INTRAMUSCULAR | Status: AC
  Filled 2018-05-19: qty 2

## 2018-05-19 MED ORDER — LACTATED RINGERS IV SOLN
INTRAVENOUS | Status: DC | PRN
  Administered 2018-05-19: 14:00:00 via INTRAVENOUS

## 2018-05-19 MED ORDER — HEPARIN SODIUM (PORCINE) 1000 UNIT/ML IJ SOLN
INTRAMUSCULAR | Status: AC
  Filled 2018-05-19: qty 1

## 2018-05-19 MED ORDER — SODIUM CHLORIDE 0.9 % IV SOLN: 500.0000 mL | Freq: Once | INTRAVENOUS | Status: DC | PRN

## 2018-05-19 MED ORDER — HYDRALAZINE HCL 20 MG/ML IJ SOLN
5.0000 mg | INTRAMUSCULAR | Status: DC | PRN
  Administered 2018-05-20: 08:00:00 5 mg via INTRAVENOUS
  Filled 2018-05-19: qty 1

## 2018-05-19 MED ORDER — FAMOTIDINE IN NACL 20-0.9 MG/50ML-% IV SOLN
20.0000 mg | Freq: Two times a day (BID) | INTRAVENOUS | Status: DC
  Administered 2018-05-19 – 2018-05-20 (×2): 20 mg via INTRAVENOUS
  Filled 2018-05-19 (×2): qty 50

## 2018-05-19 MED ORDER — SODIUM CHLORIDE 0.9 % IV SOLN
INTRAVENOUS | Status: DC
  Administered 2018-05-19 – 2018-05-20 (×2): via INTRAVENOUS

## 2018-05-19 MED ORDER — GUAIFENESIN-DM 100-10 MG/5ML PO SYRP: 15.0000 mL | ORAL_SOLUTION | ORAL | Status: DC | PRN

## 2018-05-19 MED ORDER — FERROUS SULFATE 325 (65 FE) MG PO TABS
325.0000 mg | ORAL_TABLET | ORAL | Status: DC
  Administered 2018-05-20: 08:00:00 325 mg via ORAL
  Filled 2018-05-19: qty 1

## 2018-05-19 MED ORDER — LABETALOL HCL 5 MG/ML IV SOLN
10.0000 mg | INTRAVENOUS | Status: DC | PRN
  Administered 2018-05-19: 23:00:00 10 mg via INTRAVENOUS
  Administered 2018-05-20 (×2): 5 mg via INTRAVENOUS
  Filled 2018-05-19 (×2): qty 4

## 2018-05-19 MED ORDER — VENLAFAXINE HCL ER 150 MG PO CP24
150.0000 mg | ORAL_CAPSULE | Freq: Every day | ORAL | Status: DC
  Administered 2018-05-19: 150 mg via ORAL
  Filled 2018-05-19 (×2): qty 1
  Filled 2018-05-19: qty 2

## 2018-05-19 MED ORDER — GABAPENTIN 100 MG PO CAPS
100.0000 mg | ORAL_CAPSULE | Freq: Two times a day (BID) | ORAL | Status: DC
  Administered 2018-05-19 – 2018-05-20 (×3): 100 mg via ORAL
  Filled 2018-05-19 (×2): qty 1

## 2018-05-19 MED ORDER — EVICEL 2 ML EX KIT
PACK | CUTANEOUS | Status: DC | PRN
  Administered 2018-05-19: 2 mL via TOPICAL

## 2018-05-19 MED ORDER — HEPARIN SODIUM (PORCINE) 1000 UNIT/ML IJ SOLN
INTRAMUSCULAR | Status: DC | PRN
  Administered 2018-05-19: 5000 [IU] via INTRAVENOUS

## 2018-05-19 MED ORDER — EPHEDRINE SULFATE 50 MG/ML IJ SOLN
INTRAMUSCULAR | Status: AC
  Filled 2018-05-19: qty 1

## 2018-05-19 MED ORDER — LIDOCAINE HCL (PF) 1 % IJ SOLN
INTRAMUSCULAR | Status: AC
  Filled 2018-05-19: qty 30

## 2018-05-19 MED ORDER — ONDANSETRON HCL 4 MG/2ML IJ SOLN
INTRAMUSCULAR | Status: AC
  Filled 2018-05-19: qty 2

## 2018-05-19 MED ORDER — PHENYLEPHRINE HCL 10 MG/ML IJ SOLN
INTRAMUSCULAR | Status: DC | PRN
  Administered 2018-05-19 (×3): 100 ug via INTRAVENOUS
  Administered 2018-05-19: 200 ug via INTRAVENOUS
  Administered 2018-05-19: 150 ug via INTRAVENOUS

## 2018-05-19 MED ORDER — HYDROCODONE-ACETAMINOPHEN 5-325 MG PO TABS
1.0000 | ORAL_TABLET | Freq: Four times a day (QID) | ORAL | Status: DC | PRN
  Administered 2018-05-19: 1 via ORAL
  Filled 2018-05-19: qty 1

## 2018-05-19 MED ORDER — LABETALOL HCL 5 MG/ML IV SOLN
INTRAVENOUS | Status: AC
  Filled 2018-05-19: qty 4

## 2018-05-19 MED ORDER — MAGNESIUM SULFATE 2 GM/50ML IV SOLN: 2.0000 g | Freq: Every day | INTRAVENOUS | Status: DC | PRN

## 2018-05-19 MED ORDER — POLYETHYLENE GLYCOL 3350 17 G PO PACK: 17.0000 g | PACK | Freq: Every day | ORAL | Status: DC | PRN

## 2018-05-19 MED ORDER — EVICEL 2 ML EX KIT
PACK | CUTANEOUS | Status: AC
  Filled 2018-05-19: qty 1

## 2018-05-19 MED ORDER — ROCURONIUM BROMIDE 50 MG/5ML IV SOLN
INTRAVENOUS | Status: AC
  Filled 2018-05-19: qty 1

## 2018-05-19 MED ORDER — ACETAMINOPHEN 650 MG RE SUPP
325.0000 mg | RECTAL | Status: DC | PRN
  Filled 2018-05-19: qty 1

## 2018-05-19 MED ORDER — ONDANSETRON HCL 4 MG/2ML IJ SOLN: 4.0000 mg | Freq: Four times a day (QID) | INTRAMUSCULAR | Status: DC | PRN

## 2018-05-19 MED ORDER — PROPOFOL 10 MG/ML IV BOLUS
INTRAVENOUS | Status: DC | PRN
  Administered 2018-05-19: 80 mg via INTRAVENOUS

## 2018-05-19 MED ORDER — SUGAMMADEX SODIUM 200 MG/2ML IV SOLN
INTRAVENOUS | Status: AC
  Filled 2018-05-19: qty 2

## 2018-05-19 MED ORDER — SUGAMMADEX SODIUM 200 MG/2ML IV SOLN
INTRAVENOUS | Status: DC | PRN
  Administered 2018-05-19: 100 mg via INTRAVENOUS

## 2018-05-19 MED ORDER — APIXABAN 2.5 MG PO TABS
2.5000 mg | ORAL_TABLET | Freq: Two times a day (BID) | ORAL | Status: DC
  Administered 2018-05-20: 2.5 mg via ORAL
  Filled 2018-05-19 (×2): qty 1

## 2018-05-19 MED ORDER — ONDANSETRON HCL 4 MG/2ML IJ SOLN
INTRAMUSCULAR | Status: DC | PRN
  Administered 2018-05-19: 4 mg via INTRAVENOUS

## 2018-05-19 MED ORDER — CHLORHEXIDINE GLUCONATE CLOTH 2 % EX PADS: 6.0000 | MEDICATED_PAD | Freq: Once | CUTANEOUS | Status: DC

## 2018-05-19 MED ORDER — LIDOCAINE-EPINEPHRINE (PF) 1 %-1:200000 IJ SOLN
INTRAMUSCULAR | Status: AC
  Filled 2018-05-19: qty 30

## 2018-05-19 MED ORDER — LIDOCAINE HCL (PF) 2 % IJ SOLN
INTRAMUSCULAR | Status: AC
  Filled 2018-05-19: qty 10

## 2018-05-19 MED ORDER — CEFAZOLIN SODIUM-DEXTROSE 2-4 GM/100ML-% IV SOLN
INTRAVENOUS | Status: AC
  Filled 2018-05-19: qty 100

## 2018-05-19 MED ORDER — ONDANSETRON 4 MG PO TBDP
4.0000 mg | ORAL_TABLET | Freq: Three times a day (TID) | ORAL | Status: DC | PRN
  Filled 2018-05-19: qty 1

## 2018-05-19 MED ORDER — SODIUM CHLORIDE 0.9 % IV SOLN
INTRAVENOUS | Status: DC | PRN
  Administered 2018-05-19: 15:00:00 20 ug/min via INTRAVENOUS

## 2018-05-19 SURGICAL SUPPLY — 62 items
"PENCIL ELECTRO HAND CTR " (MISCELLANEOUS) IMPLANT
BAG DECANTER FOR FLEXI CONT (MISCELLANEOUS) ×3 IMPLANT
BLADE SURG 15 STRL LF DISP TIS (BLADE) ×1 IMPLANT
BLADE SURG 15 STRL SS (BLADE) ×2
BLADE SURG SZ11 CARB STEEL (BLADE) ×3 IMPLANT
BOOT SUTURE AID YELLOW STND (SUTURE) ×3 IMPLANT
BRUSH SCRUB EZ  4% CHG (MISCELLANEOUS) ×2
BRUSH SCRUB EZ 4% CHG (MISCELLANEOUS) ×1 IMPLANT
CANISTER SUCT 1200ML W/VALVE (MISCELLANEOUS) ×3 IMPLANT
CHLORAPREP W/TINT 26ML (MISCELLANEOUS) ×3 IMPLANT
COVER WAND RF STERILE (DRAPES) ×3 IMPLANT
DERMABOND ADVANCED (GAUZE/BANDAGES/DRESSINGS) ×2
DERMABOND ADVANCED .7 DNX12 (GAUZE/BANDAGES/DRESSINGS) ×1 IMPLANT
DRAPE INCISE IOBAN 66X45 STRL (DRAPES) ×3 IMPLANT
DRAPE LAPAROTOMY 77X122 PED (DRAPES) ×3 IMPLANT
DRAPE SHEET LG 3/4 BI-LAMINATE (DRAPES) ×3 IMPLANT
ELECT CAUTERY BLADE 6.4 (BLADE) ×3 IMPLANT
ELECT REM PT RETURN 9FT ADLT (ELECTROSURGICAL) ×3
ELECTRODE REM PT RTRN 9FT ADLT (ELECTROSURGICAL) ×1 IMPLANT
GLOVE BIO SURGEON STRL SZ7 (GLOVE) ×9 IMPLANT
GLOVE INDICATOR 7.5 STRL GRN (GLOVE) ×3 IMPLANT
GOWN STRL REUS W/ TWL LRG LVL3 (GOWN DISPOSABLE) ×2 IMPLANT
GOWN STRL REUS W/ TWL XL LVL3 (GOWN DISPOSABLE) ×2 IMPLANT
GOWN STRL REUS W/TWL LRG LVL3 (GOWN DISPOSABLE) ×2
GOWN STRL REUS W/TWL XL LVL3 (GOWN DISPOSABLE) ×4
HEMOSTAT SURGICEL 2X3 (HEMOSTASIS) ×3 IMPLANT
IV NS 250ML (IV SOLUTION) ×2
IV NS 250ML BAXH (IV SOLUTION) ×1 IMPLANT
KIT TURNOVER KIT A (KITS) ×3 IMPLANT
LABEL OR SOLS (LABEL) ×3 IMPLANT
LOOP RED MAXI  1X406MM (MISCELLANEOUS) ×4
LOOP VESSEL MAXI 1X406 RED (MISCELLANEOUS) ×2 IMPLANT
LOOP VESSEL MINI 0.8X406 BLUE (MISCELLANEOUS) ×1 IMPLANT
LOOPS BLUE MINI 0.8X406MM (MISCELLANEOUS) ×2
NDL FILTER BLUNT 18X1 1/2 (NEEDLE) ×1 IMPLANT
NDL HYPO 25X1 1.5 SAFETY (NEEDLE) ×1 IMPLANT
NEEDLE FILTER BLUNT 18X 1/2SAF (NEEDLE) ×2
NEEDLE FILTER BLUNT 18X1 1/2 (NEEDLE) ×1 IMPLANT
NEEDLE HYPO 25X1 1.5 SAFETY (NEEDLE) ×3 IMPLANT
NS IRRIG 500ML POUR BTL (IV SOLUTION) ×3 IMPLANT
PACK BASIN MAJOR ARMC (MISCELLANEOUS) ×3 IMPLANT
PATCH CAROTID ECM VASC 1X10 (Prosthesis & Implant Heart) ×3 IMPLANT
PENCIL ELECTRO HAND CTR (MISCELLANEOUS) IMPLANT
SHUNT W TPORT 9FR PRUITT F3 (SHUNT) ×3 IMPLANT
SUT MNCRL 4-0 (SUTURE) ×2
SUT MNCRL 4-0 27XMFL (SUTURE) ×1
SUT PROLENE 6 0 BV (SUTURE) ×12 IMPLANT
SUT PROLENE 7 0 BV 1 (SUTURE) ×6 IMPLANT
SUT SILK 2 0 (SUTURE) ×2
SUT SILK 2-0 18XBRD TIE 12 (SUTURE) ×1 IMPLANT
SUT SILK 3 0 (SUTURE) ×2
SUT SILK 3-0 18XBRD TIE 12 (SUTURE) ×1 IMPLANT
SUT SILK 4 0 (SUTURE) ×2
SUT SILK 4-0 18XBRD TIE 12 (SUTURE) ×1 IMPLANT
SUT VIC AB 3-0 SH 27 (SUTURE) ×4
SUT VIC AB 3-0 SH 27X BRD (SUTURE) ×2 IMPLANT
SUTURE MNCRL 4-0 27XMF (SUTURE) ×1 IMPLANT
SYR 10ML LL (SYRINGE) ×6 IMPLANT
SYR 20CC LL (SYRINGE) ×3 IMPLANT
TRAY FOLEY MTR SLVR 16FR STAT (SET/KITS/TRAYS/PACK) ×3 IMPLANT
TUBING CONNECTING 10 (TUBING) IMPLANT
TUBING CONNECTING 10' (TUBING)

## 2018-05-19 NOTE — Anesthesia Postprocedure Evaluation (Signed)
Anesthesia Post Note  Patient: Sonya [name]  Procedure(s) Performed: ENDARTERECTOMY CAROTID (Left Neck) PATCH ANGIOPLASTY (Left Neck)  Patient location during evaluation: PACU Anesthesia Type: General Level of consciousness: awake and alert Pain management: pain level controlled Vital Signs Assessment: post-procedure vital signs reviewed and stable Respiratory status: spontaneous breathing and respiratory function stable Cardiovascular status: stable Anesthetic complications: no     Last Vitals:  Vitals:   05/19/18 1615 05/19/18 1630  BP:    Pulse: 85 81  Resp: 14 11  Temp:    SpO2: 100% 100%    Last Pain:  Vitals:   05/19/18 1614  TempSrc:   PainSc: 0-No pain                 Aleksi Brummet K

## 2018-05-19 NOTE — Op Note (Signed)
[place] [facility]   OPERATIVE NOTE  PROCEDURE:   1.  left carotid endarterectomy with CorMatrix arterial patch reconstruction  PRE-OPERATIVE DIAGNOSIS: 1.  High grade left carotid stenosis 2. Previous stroke with left sided weakness and multiple episodes of syncope and confusion worrisome for TIAs  POST-OPERATIVE DIAGNOSIS: same as above   SURGEON: Leotis Pain, MD  ASSISTANT(S): Hezzie Bump, PA-C  ANESTHESIA: general  ESTIMATED BLOOD LOSS: 25 cc  FINDING(S): 1.  Left carotid plaque.  SPECIMEN(S):  Carotid plaque (sent to Pathology)  INDICATIONS:   Sonya [name] is a 85 y.o. female who presents with left carotid stenosis of 90%.  I discussed with the patient the risks, benefits, and alternatives to carotid endarterectomy.  I discussed the differences between carotid stenting and carotid endarterectomy. I discussed the procedural details of carotid endarterectomy with the patient.  The patient is aware that the risks of carotid endarterectomy include but are not limited to: bleeding, infection, stroke, myocardial infarction, death, cranial nerve injuries both temporary and permanent, neck hematoma, possible airway compromise, labile blood pressure post-operatively, cerebral hyperperfusion syndrome, and possible need for additional interventions in the future. The patient is aware of the risks and agrees to proceed forward with the procedure. An assistant was present during the procedure to help facilitate the exposure and expedite the procedure.  DESCRIPTION: After full informed written consent was obtained from the patient, the patient was brought back to the operating room and placed supine upon the operating table.  Prior to induction, the patient received IV antibiotics.  After obtaining adequate anesthesia, the patient was placed into a modified beach chair position with a shoulder roll in place and the patient's neck slightly hyperextended and rotated away from  the surgical site.  The patient was prepped in the standard fashion for a carotid endarterectomy. The assistant provided retraction and mobilization to help facilitate exposure and expedite the procedure throughout the entire procedure.  This included following suture, using retractors, and optimizing lighting. I made an incision anterior to the sternocleidomastoid muscle and dissected down through the subcutaneous tissue.  The platysmas was opened with electrocautery.  Then I dissected down to the internal jugular vein and facial vein.  The facial vein is ligated and divided between 2-0 silk ties.  This was dissected posteriorly until I obtained visualization of the common carotid artery.  This was dissected out and then a vessel loop was placed around the common carotid artery.  I then dissected in a periadventitial fashion along the common carotid artery up to the bifurcation.  I then identified the external carotid artery and the superior thyroid artery.  I placed a vessel loop around the superior thyroid artery, and I also dissected out the external carotid artery and placed a vessel loop around it. In the process of this dissection, the hypoglossal nerve was identified and protected from harm.  I then dissected out the internal carotid artery until I identified an area in the internal carotid artery clearly above the stenosis.  I dissected slightly distal to this area, and placed a vessel loop around the artery.  At this point, we gave the patient 5000 units of intravenous heparin.  After this was allowed to circulate for several minutes, I pulled up control on the vessel loops to clamp the internal carotid artery, external carotid artery, superior thyroid artery, and then the common carotid artery.  I then made an arteriotomy in the common carotid artery with a 11 blade, and extended the arteriotomy with  a Potts scissor down into the common carotid artery, then I carried the arteriotomy through the  bifurcation into the internal carotid artery until I reached an area that was not diseased.  At this point, I took the [place] shunt that previously been prepared and I inserted it into the internal carotid artery first, and then into the common carotid artery taking care to flush and de-air prior to release of control. At this point, I started the endarterectomy in the common carotid artery with a [place] elevator and carried this dissection down into the common carotid artery circumferentially.  Then I transected the plaque at a segment where it was adherent and transected the plaque with Potts scissors.  I then carried this dissection up into the external carotid artery.  The plaque was extracted by unclamping the external carotid artery and performing an eversion endarterectomy.  The dissection was then carried into the internal carotid artery where a nice feathered end point was created with gentle traction.  I passed the plaque off the field as a specimen. At this point I removed all loose flecks and remaining disease possible.  At this point, I was satisfied that the minimal remaining disease was densely adherent to the wall and wall integrity was intact. The distal endpoint was tacked down with two 7-0 Prolene sutures.  I then fashioned a CorMatrix arterial patch for the artery and sewed it in place with two running stitch of 6-0 Prolene.  I started at the distal endpoint and ran one half the length of the arteriotomy.  I then cut and beveled the patch to an appropriate length to match the arteriotomy.  I started the second 6-0 Prolene at the proximal end point.  The medial suture line was completed and the lateral suture line was run approximately one quarter the length of the arteriotomy.  Prior to completing this patch angioplasty, I removed the shunt first from the internal carotid artery, from which there was excellent backbleeding, and clamped it.  Then I removed the shunt from the common  carotid artery, from which there was excellent antegrade bleeding, and then clamped it.  At this point, I allowed the external carotid artery to backbleed, which was excellent.  Then I instilled heparinized saline in this patched artery and then completed the patch angioplasty in the usual fashion.  First, I released the clamp on the external carotid artery, then I released it on the common carotid artery.  After waiting a few seconds, I then released it on the internal carotid artery. Several minutes of pressure were held and 6-0 Prolene patch sutures were used as need for hemostasis.  At this point, I placed Surgicel and Evicel topical hemostatic agents.  There was no more active bleeding in the surgical site.  The sternocleidomastoid space was closed with three interrupted 3-0 Vicryl sutures. I then reapproximated the platysma muscle with a running stitch of 3-0 Vicryl.  The skin was then closed with a running subcuticular 4-0 Monocryl.  The skin was then cleaned, dried and Dermabond was used to reinforce the skin closure.  The patient awakened and was taken to the recovery room in stable condition, following commands and moving all four extremities without any apparent deficits.    COMPLICATIONS: none  CONDITION: stable  Leotis Pain  05/19/2018, 3:36 PM    This note was created with Dragon Medical transcription system. Any errors in dictation are purely unintentional.

## 2018-05-19 NOTE — Anesthesia Post-op Follow-up Note (Signed)
Anesthesia QCDR form completed.        

## 2018-05-19 NOTE — Transfer of Care (Signed)
Immediate Anesthesia Transfer of Care Note  Patient: Sonya [name]  Procedure(s) Performed: ENDARTERECTOMY CAROTID (Left Neck) PATCH ANGIOPLASTY (Left Neck)  Patient Location: PACU  Anesthesia Type:General  Level of Consciousness: drowsy and patient cooperative  Airway & Oxygen Therapy: Patient Spontanous Breathing and Patient connected to face mask oxygen  Post-op Assessment: Report given to RN and Post -op Vital signs reviewed and stable  Post vital signs: Reviewed and stable  Last Vitals:  Vitals Value Taken Time  BP 160/72 05/19/2018  4:20 PM  Temp 36.4 C 05/19/2018  4:14 PM  Pulse 84 05/19/2018  4:21 PM  Resp 20 05/19/2018  4:21 PM  SpO2 100 % 05/19/2018  4:21 PM  Vitals shown include unvalidated device data.  Last Pain:  Vitals:   05/19/18 1614  TempSrc:   PainSc: (P) 0-No pain         Complications: No apparent anesthesia complications

## 2018-05-19 NOTE — Anesthesia Procedure Notes (Signed)
Arterial Line Insertion Start/End4/02/2018 2:20 PM, 05/19/2018 2:32 PM Performed by: Emmie Niemann, MD, anesthesiologist  Patient location: Pre-op. Preanesthetic checklist: patient identified, IV checked, site marked, risks and benefits discussed, surgical consent, monitors and equipment checked, pre-op evaluation, timeout performed and anesthesia consent Right, radial was placed Catheter size: 20 Fr Hand hygiene performed , maximum sterile barriers used  and Seldinger technique used  Attempts: 2 Procedure performed using ultrasound guided technique. Ultrasound Notes:anatomy identified, needle tip was noted to be adjacent to the nerve/plexus identified and no ultrasound evidence of intravascular and/or intraneural injection Following insertion, dressing applied and Biopatch. Post procedure assessment: normal and unchanged  Post procedure complications: local hematoma. Patient tolerated the procedure well with no immediate complications.

## 2018-05-19 NOTE — Anesthesia Procedure Notes (Signed)
Procedure Name: Intubation Performed by: Kloey Cazarez, CRNA Pre-anesthesia Checklist: Patient identified, Patient being monitored, Timeout performed, Emergency Drugs available and Suction available Patient Re-evaluated:Patient Re-evaluated prior to induction Oxygen Delivery Method: Circle system utilized Preoxygenation: Pre-oxygenation with 100% oxygen Induction Type: IV induction Ventilation: Mask ventilation without difficulty Laryngoscope Size: 3 and McGraph Grade View: Grade I Tube type: Oral Tube size: 7.0 mm Number of attempts: 1 Airway Equipment and Method: Stylet and Video-laryngoscopy Placement Confirmation: ETT inserted through vocal cords under direct vision,  positive ETCO2 and breath sounds checked- equal and bilateral Secured at: 20 cm Tube secured with: Tape Dental Injury: Teeth and Oropharynx as per pre-operative assessment        

## 2018-05-19 NOTE — H&P (Signed)
[place] [facility] History & Physical Update  The patient was interviewed and re-examined.  The patient's previous History and Physical has been reviewed and is unchanged.  There is no change in the plan of care. We plan to proceed with the scheduled procedure.  Leotis Pain, MD  05/19/2018, 1:42 PM

## 2018-05-19 NOTE — Anesthesia Preprocedure Evaluation (Addendum)
Anesthesia Evaluation  Patient identified by MRN, date of birth, ID band Patient awake    Reviewed: Allergy & Precautions, NPO status , Patient's Chart, lab work & pertinent test results, reviewed documented beta blocker date and time   Airway Mallampati: II  TM Distance: >3 FB     Dental  (+) Chipped   Pulmonary former smoker,    Pulmonary exam normal        Cardiovascular hypertension, Pt. on medications + CAD and + Cardiac Stents  Normal cardiovascular exam     Neuro/Psych Left sided weakness CVA, Residual Symptoms negative psych ROS   GI/Hepatic Neg liver ROS, GERD  ,  Endo/Other  Hypothyroidism   Renal/GU Renal disease     Musculoskeletal   Abdominal Normal abdominal exam  (+)   Peds negative pediatric ROS (+)  Hematology negative hematology ROS (+)   Anesthesia Other Findings EF 60-65 6 mo ago. Past Medical History: No date: CAD (coronary artery disease) No date: Carpal tunnel syndrome No date: GERD (gastroesophageal reflux disease) No date: History of shingles 1960: Hormone receptor positive breast cancer (HCC) No date: Hyperlipemia No date: Hypertension No date: Hypothyroidism No date: Stroke [facility])     Comment:  left sided weakness  Reproductive/Obstetrics                            Anesthesia Physical  Anesthesia Plan  ASA: III  Anesthesia Plan: General   Post-op Pain Management:    Induction: Intravenous  PONV Risk Score and Plan:   Airway Management Planned: Oral ETT  Additional Equipment: Arterial line  Intra-op Plan:   Post-operative Plan: Extubation in OR  Informed Consent: I have reviewed the patients History and Physical, chart, labs and discussed the procedure including the risks, benefits and alternatives for the proposed anesthesia with the patient or authorized representative who has indicated his/her understanding and acceptance.     Dental  advisory given  Plan Discussed with: CRNA and Surgeon  Anesthesia Plan Comments:         Anesthesia Quick Evaluation

## 2018-05-20 ENCOUNTER — Encounter: Payer: Self-pay | Admitting: Vascular Surgery

## 2018-05-20 ENCOUNTER — Telehealth (INDEPENDENT_AMBULATORY_CARE_PROVIDER_SITE_OTHER): Payer: Self-pay | Admitting: Vascular Surgery

## 2018-05-20 DIAGNOSIS — Z9889 Other specified postprocedural states: Secondary | ICD-10-CM

## 2018-05-20 DIAGNOSIS — I6522 Occlusion and stenosis of left carotid artery: Principal | ICD-10-CM

## 2018-05-20 LAB — BASIC METABOLIC PANEL
Anion gap: 10 (ref 5–15)
BUN: 15 mg/dL (ref 8–23)
CO2: 24 mmol/L (ref 22–32)
Calcium: 8.4 mg/dL — ABNORMAL LOW (ref 8.9–10.3)
Chloride: 104 mmol/L (ref 98–111)
Creatinine, Ser: 0.64 mg/dL (ref 0.44–1.00)
GFR calc Af Amer: >60 mL/min (ref >60)
GFR calc non Af Amer: >60 mL/min (ref >60)
Glucose, Bld: 109 mg/dL — ABNORMAL HIGH (ref 70–99)
Potassium: 4 mmol/L (ref 3.5–5.1)
Sodium: 138 mmol/L (ref 135–145)

## 2018-05-20 LAB — CBC
HCT: 29.7 % — ABNORMAL LOW (ref 36.0–46.0)
Hemoglobin: 9.3 g/dL — ABNORMAL LOW (ref 12.0–15.0)
MCH: 26.6 pg (ref 26.0–34.0)
MCHC: 31.3 g/dL (ref 30.0–36.0)
MCV: 85.1 fL (ref 80.0–100.0)
Platelets: 235 10*3/uL (ref 150–400)
RBC: 3.49 MIL/uL — ABNORMAL LOW (ref 3.87–5.11)
RDW: 15.8 % — ABNORMAL HIGH (ref 11.5–15.5)
WBC: 9.7 10*3/uL (ref 4.0–10.5)
nRBC: 0 % (ref 0.0–0.2)

## 2018-05-20 MED ORDER — FAMOTIDINE 20 MG PO TABS: 20.0000 mg | ORAL_TABLET | Freq: Two times a day (BID) | ORAL | Status: DC

## 2018-05-20 MED ORDER — ASPIRIN 81 MG PO TBEC: 81.0000 mg | DELAYED_RELEASE_TABLET | Freq: Every day | ORAL | Status: DC

## 2018-05-20 MED ORDER — ACETAMINOPHEN 325 MG PO TABS: 325.0000 mg | ORAL_TABLET | ORAL | Status: DC | PRN

## 2018-05-20 MED ORDER — ACETAMINOPHEN 325 MG RE SUPP: 325.0000 mg | RECTAL | 0 refills | Status: DC | PRN

## 2018-05-20 NOTE — Progress Notes (Signed)
Pt with foley catheter DCd 0830 this AM, no urine at 1300, per bladder scan only 100 mls in bladder, per Jerel Shepherd, PA, she spoke with Dr Lucky Cowboy and we can go ahead and discharge patient home.  BP has improved and she is alert, PO intake adequate when she is fed, she is unable to feed herself sufficiently.  Writing RN will inform son to make sure she urinates this afternoon and received help to eat.  VSS on room air, Aline DCd this AM, no bleeding, endartorectomy site clean and dry, honeycomeb removed by PA this AM.  Will DC PIVs prior to discharge.  Pt will leave hospital with her son

## 2018-05-20 NOTE — TOC Transition Note (Signed)
Transition of Care [facility]) - CM/SW Discharge Note   Patient Details  Name: Sonya [name] MRN: [id number] Date of Birth: [birth year]  [facility]) CM/SW Contact:  Shelbie Hutching, RN Phone Number: 05/20/2018, 11:33 AM   Clinical Narrative:       Final next level of care: Home w Home Health Services Barriers to Discharge: Barriers Resolved, No Barriers Identified   Patient Goals and CMS Choice Patient states their goals for this hospitalization and ongoing recovery are:: Patient's son wants his mother to come home and continue with PT CMS Medicare.gov Compare Post Acute Care list provided to:: (Open with [facility])    Discharge Placement                       Discharge Plan and Services                          Social Determinants of Health (SDOH) Interventions     Readmission Risk Interventions No flowsheet data found.

## 2018-05-20 NOTE — Discharge Summary (Signed)
[facility]    Discharge Summary  Patient ID:  Sonya [name] MRN: [id number] DOB/AGE: 83-15-1934 83 y.o.  Admit date: 05/19/2018 Discharge date: 05/20/2018 Date of Surgery: 05/19/2018 Surgeon: Surgeon(s): Lucky Cowboy Erskine Squibb, MD  Admission Diagnosis: CAROTID ARTERY STENOSIS  Discharge Diagnoses:  CAROTID ARTERY STENOSIS  Secondary Diagnoses: Past Medical History:  Diagnosis Date  . CAD (coronary artery disease)   . Carpal tunnel syndrome   . GERD (gastroesophageal reflux disease)   . History of shingles   . Hormone receptor positive breast cancer ([place]) 1960  . Hyperlipemia   . Hypertension   . Hypothyroidism   . Stroke [facility])    left sided weakness   Procedure(s): ENDARTERECTOMY CAROTID - RIGHT PATCH ANGIOPLASTY  Discharged Condition: good  HPI:  Sonya [name] is a 85 year old female who presents with left carotid stenosis of 90%. On 05/19/18, the patient underwent a left carotid endarterectomy with CorMatrix arterial patch reconstruction.  The patient's night of surgery was unremarkable.  On postop day #1 / discharge, the patient was tolerating a regular diet, pain was controlled through the use of PO pain medication, she was urinating independently and ambulating at her baseline level.  Vital signs are stable. Her physical exam is unremarkable / as to be expected.  Hospital Course:  Sonya [name] is a 85 y.o. female is S/P Left:  Procedure(s): ENDARTERECTOMY CAROTID PATCH ANGIOPLASTY  Extubated: POD # 0  Physical exam:  [name], NAD Face: Symmetrical, Tongue Midline Neck: OR dressing removed. Incision - clean, dry and intact. Dermabond intact. CV: RRR Pulm: CTA Bilaterally Abdomen: CTA Bilaterally Extremities: Warm, non-tender, minimal edema Neuro: At baseline with some left sided weakness, no new deficits noted  Post-op wounds clean, dry, intact or healing well  Pt. Ambulating, voiding and taking PO diet without difficulty.  Pt pain  controlled with PO pain meds.  Labs as below  Complications: None  Consults: None  Significant Diagnostic Studies: CBC Lab Results  Component Value Date   WBC 9.7 05/20/2018   HGB 9.3 (L) 05/20/2018   HCT 29.7 (L) 05/20/2018   MCV 85.1 05/20/2018   PLT 235 05/20/2018   BMET    Component Value Date/Time   NA 138 05/20/2018 0551   K 4.0 05/20/2018 0551   CL 104 05/20/2018 0551   CO2 24 05/20/2018 0551   GLUCOSE 109 (H) 05/20/2018 0551   BUN 15 05/20/2018 0551   CREATININE 0.64 05/20/2018 0551   CALCIUM 8.4 (L) 05/20/2018 0551   GFRNONAA >60 05/20/2018 0551   GFRAA >60 05/20/2018 0551   COAG Lab Results  Component Value Date   INR 1.1 05/12/2018   INR 1.57 03/22/2018   INR 1.06 10/30/2017   Disposition:  Discharge to :Home  Allergies as of 05/20/2018      Reactions   Clopidogrel Nausea And Vomiting    reported by [place] 11/24/13   Omeprazole Other (See Comments)   Unknown reaction - reported by [place] 11/24/13      Medication List    STOP taking these medications   HYDROcodone-acetaminophen 5-325 MG tablet Commonly known as:  NORCO/VICODIN     TAKE these medications   acetaminophen 325 MG tablet Commonly known as:  TYLENOL Take 1 tablet (325 mg total) by mouth every 4 (four) hours as needed for mild pain (or temp >/= 101 F).   acetaminophen 325 MG suppository Commonly known as:  TYLENOL Place 1 suppository (325 mg total) rectally every  4 (four) hours as needed for mild pain (or temp >/= 101 F).   apixaban 2.5 MG Tabs tablet Commonly known as:  ELIQUIS Take 1 tablet (2.5 mg total) by mouth 2 (two) times daily.   aspirin 81 MG EC tablet Take 1 tablet (81 mg total) by mouth daily.   B-complex with vitamin C tablet Take 1 tablet by mouth daily.   bisacodyl 5 MG EC tablet Commonly known as:  Ducodyl Take 2 tablets (10 mg total) by mouth daily.   feeding supplement (ENSURE ENLIVE) Liqd Take 237 mLs by  mouth 2 (two) times daily between meals.   feeding supplement (PRO-STAT SUGAR FREE 64) Liqd Take 30 mLs by mouth 2 (two) times daily.   ferrous sulfate 325 (65 FE) MG tablet Take 1 tablet (325 mg total) by mouth 2 (two) times daily with a meal. What changed:  when to take this   gabapentin 100 MG capsule Commonly known as:  NEURONTIN Take 100 mg by mouth 2 (two) times daily.   lansoprazole 30 MG capsule Commonly known as:  Prevacid Take 1 capsule (30 mg total) by mouth daily. What changed:  when to take this   levothyroxine 25 MCG tablet Commonly known as:  SYNTHROID, LEVOTHROID Take 25 mcg by mouth daily before breakfast.   magnesium oxide 400 MG tablet Commonly known as:  MAG-OX Take 400 mg by mouth daily.   Melatonin 3 MG Tabs Take 3 mg by mouth at bedtime as needed (sleep).   metoprolol tartrate 25 MG tablet Commonly known as:  LOPRESSOR Take 1 tablet (25 mg total) by mouth 2 (two) times daily.   midodrine 2.5 MG tablet Commonly known as:  PROAMATINE Take 1 tablet (2.5 mg total) by mouth 2 (two) times daily with a meal.   ondansetron 4 MG disintegrating tablet Commonly known as:  ZOFRAN-ODT Take 1 tablet (4 mg total) by mouth every 8 (eight) hours as needed for nausea or vomiting.   polyethylene glycol packet Commonly known as:  MIRALAX / GLYCOLAX Take 17 g by mouth daily as needed for moderate constipation.   simvastatin 40 MG tablet Commonly known as:  ZOCOR Take 1 tablet (40 mg total) by mouth daily at 6 PM.   venlafaxine XR 150 MG 24 hr capsule Commonly known as:  EFFEXOR-XR Take 150 mg by mouth at bedtime.   Vitamin D3 25 MCG (1000 UT) Caps Take 3,000 Units by mouth at bedtime.      Verbal and written Discharge instructions given to the patient. Wound care per Discharge AVS Follow-up Information    Dew, Erskine Squibb, MD Follow up in 3 month(s).   Specialties:  Vascular Surgery, Radiology, Interventional Cardiology Why:  First post-op visit. Will need  bilateral carotid duplex. Can see [facility] or [facility]. Contact information: [place] [address] [phone]          Signed: Sela Hua, PA-C  05/20/2018, 10:18 AM

## 2018-05-20 NOTE — TOC Initial Note (Signed)
Transition of Care [facility]) - Initial/Assessment Note    Patient Details  Name: Sonya [name] MRN: [id number] Date of Birth: [birth year]  Transition of Care [facility]) CM/SW Contact:    Shelbie Hutching, RN Phone Number: 05/20/2018, 11:30 AM  Clinical Narrative:                 Patient admitted to the hospital for carotid artery stenosis s/p carotid enterectomy.  Patient will be discharged home today with son.  Patient' son [name] will come and pick patient up.  [name] has been living with his mother since the patient's husband died in 05/03/2022.  Patient is wheelchair bound and has [facility] home [facility] coming out for PT, [name] has already notified them that patient is coming home and they will come out tomorrow.    Expected Discharge Plan: [place] Barriers to Discharge: Barriers Resolved   Patient Goals and CMS Choice Patient states their goals for this hospitalization and ongoing recovery are:: Patient's son wants his mother to come home and continue with PT CMS Medicare.gov Compare Post Acute Care list provided to:: (Open with [facility])    Expected Discharge Plan and Services Expected Discharge Plan: [place]       Living arrangements for the past 2 months: Single Family Home Expected Discharge Date: 05/20/18                        Prior Living Arrangements/Services Living arrangements for the past 2 months: Single Family Home Lives with:: Adult Children Patient language and need for interpreter reviewed:: Yes Do you feel safe going back to the place where you live?: Yes      Need for Family Participation in Patient Care: Yes (Comment) Care giver support system in place?: Yes (comment)(Son [name]) Current home services: Home PT, DME Criminal Activity/Legal Involvement Pertinent to Current Situation/Hospitalization: No - Comment as needed  Activities of Daily Living Home Assistive Devices/Equipment: Wheelchair ADL Screening (condition at time of  admission) Patient's cognitive ability adequate to safely complete daily activities?: Yes Is the patient deaf or have difficulty hearing?: No Does the patient have difficulty seeing, even when wearing glasses/contacts?: No Does the patient have difficulty concentrating, remembering, or making decisions?: No Patient able to express need for assistance with ADLs?: Yes Does the patient have difficulty dressing or bathing?: Yes Independently performs ADLs?: No Communication: Independent Dressing (OT): Needs assistance Is this a change from baseline?: Pre-admission baseline Grooming: Needs assistance Is this a change from baseline?: Pre-admission baseline Feeding: Independent Bathing: Needs assistance Is this a change from baseline?: Pre-admission baseline Toileting: Needs assistance Is this a change from baseline?: Pre-admission baseline In/Out Bed: Needs assistance Is this a change from baseline?: Pre-admission baseline Walks in Home: Dependent Is this a change from baseline?: Pre-admission baseline Does the patient have difficulty walking or climbing stairs?: Yes Weakness of Legs: Left Weakness of Arms/Hands: Left  Permission Sought/Granted                  Emotional Assessment Appearance:: Appears stated age       Alcohol / Substance Use: Not Applicable Psych Involvement: No (comment)  Admission diagnosis:  CAROTID ARTERY STENOSIS Patient Active Problem List   Diagnosis Date Noted  . Carotid stenosis, left 05/19/2018  . Carotid stenosis 04/25/2018  . Hypotension 04/10/2018  . Protein-calorie malnutrition, severe 03/30/2018  . Hypokalemia 03/26/2018  . Coffee ground emesis   . AVM (arteriovenous malformation) of stomach,  acquired with hemorrhage   . Upper GI bleed 03/22/2018  . Pressure injury of skin 02/22/2018  . Decubitus ulcer of left ankle, unstageable ([place]) 02/22/2018  . Sepsis ([place]) 02/21/2018  . Calculus of common duct without obstruction   . Abnormal  findings on diagnostic imaging of liver   . Problems with swallowing and mastication   . Acute gastritis without hemorrhage   . Stricture and stenosis of esophagus   . AKI (acute kidney injury) ([place]) 11/05/2017  . Nausea & vomiting 11/05/2017  . Choledocholithiasis   . Dysphasia   . Abdominal pain 10/30/2017  . Cerebral embolism with cerebral infarction 10/13/2017  . CVA (cerebral vascular accident) ([place]) 10/13/2017  . Hypertension 10/12/2017  . Hypertensive urgency 10/12/2017  . Hypothyroidism 10/12/2017  . Left-sided weakness 10/12/2017  . Elevated troponin 10/12/2017  . Unknown when suspected stroke patient was last well 10/12/2017  . History of depression 12/05/2016  . Hypothyroidism due to acquired atrophy of thyroid 06/05/2014  . Chronic renal insufficiency 11/28/2013  . GERD (gastroesophageal reflux disease) 11/28/2013  . H/O vitamin D deficiency 11/28/2013  . History of ischemic heart disease 11/28/2013  . Hyperlipidemia 11/28/2013   PCP:  Baxter Hire, MD Pharmacy:   [id number] - [place], [place] [address] [place] [address] Phone: [phone] Fax: [phone]     Social Determinants of Health (SDOH) Interventions    Readmission Risk Interventions No flowsheet data found.

## 2018-05-20 NOTE — Discharge Instructions (Signed)
You can shower as of Saturday. Keep incision clean and dry. Dermabond to fall off on its own.

## 2018-05-21 LAB — SURGICAL PATHOLOGY

## 2018-05-31 ENCOUNTER — Telehealth (INDEPENDENT_AMBULATORY_CARE_PROVIDER_SITE_OTHER): Payer: Self-pay | Admitting: Vascular Surgery

## 2018-05-31 NOTE — Telephone Encounter (Signed)
Spoke with Economist.  Patient's sitter is providing assistance during the day while the patient's son works.  As per the patient's sitter, her postoperative course has been unremarkable.  Her incision is healing well.  Reminded sitter that the patient will need an appointment in approximately three months with a carotid ultrasound.  Our office had called to make a 43-month appointment however the son stated he would call back later.  An appointment has yet to be made.

## 2018-06-25 ENCOUNTER — Encounter (HOSPITAL_COMMUNITY): Payer: Self-pay | Admitting: Emergency Medicine

## 2018-06-25 ENCOUNTER — Observation Stay (HOSPITAL_COMMUNITY): Payer: Medicare PPO

## 2018-06-25 ENCOUNTER — Other Ambulatory Visit: Payer: Self-pay

## 2018-06-25 ENCOUNTER — Observation Stay (HOSPITAL_COMMUNITY)
Admission: EM | Admit: 2018-06-25 | Discharge: 2018-06-27 | Disposition: A | Discharge status: Home / Self Care | Payer: Medicare PPO | Attending: Family Medicine | Admitting: Family Medicine

## 2018-06-25 ENCOUNTER — Emergency Department (HOSPITAL_COMMUNITY): Payer: Medicare PPO

## 2018-06-25 DIAGNOSIS — D649 Anemia, unspecified: Secondary | ICD-10-CM | POA: Diagnosis not present

## 2018-06-25 DIAGNOSIS — I10 Essential (primary) hypertension: Secondary | ICD-10-CM | POA: Diagnosis not present

## 2018-06-25 DIAGNOSIS — E559 Vitamin D deficiency, unspecified: Secondary | ICD-10-CM | POA: Diagnosis not present

## 2018-06-25 DIAGNOSIS — R251 Tremor, unspecified: Secondary | ICD-10-CM

## 2018-06-25 DIAGNOSIS — I639 Cerebral infarction, unspecified: Principal | ICD-10-CM | POA: Insufficient documentation

## 2018-06-25 DIAGNOSIS — Z7901 Long term (current) use of anticoagulants: Secondary | ICD-10-CM | POA: Diagnosis not present

## 2018-06-25 DIAGNOSIS — R1310 Dysphagia, unspecified: Secondary | ICD-10-CM | POA: Diagnosis not present

## 2018-06-25 DIAGNOSIS — E039 Hypothyroidism, unspecified: Secondary | ICD-10-CM | POA: Insufficient documentation

## 2018-06-25 DIAGNOSIS — Z1159 Encounter for screening for other viral diseases: Secondary | ICD-10-CM | POA: Insufficient documentation

## 2018-06-25 DIAGNOSIS — Z7982 Long term (current) use of aspirin: Secondary | ICD-10-CM | POA: Insufficient documentation

## 2018-06-25 DIAGNOSIS — F329 Major depressive disorder, single episode, unspecified: Secondary | ICD-10-CM | POA: Insufficient documentation

## 2018-06-25 DIAGNOSIS — Z87891 Personal history of nicotine dependence: Secondary | ICD-10-CM | POA: Insufficient documentation

## 2018-06-25 DIAGNOSIS — Z87898 Personal history of other specified conditions: Secondary | ICD-10-CM

## 2018-06-25 DIAGNOSIS — R569 Unspecified convulsions: Secondary | ICD-10-CM | POA: Diagnosis present

## 2018-06-25 DIAGNOSIS — K5900 Constipation, unspecified: Secondary | ICD-10-CM | POA: Insufficient documentation

## 2018-06-25 DIAGNOSIS — E785 Hyperlipidemia, unspecified: Secondary | ICD-10-CM | POA: Diagnosis not present

## 2018-06-25 DIAGNOSIS — K219 Gastro-esophageal reflux disease without esophagitis: Secondary | ICD-10-CM | POA: Insufficient documentation

## 2018-06-25 DIAGNOSIS — Z8673 Personal history of transient ischemic attack (TIA), and cerebral infarction without residual deficits: Secondary | ICD-10-CM | POA: Diagnosis not present

## 2018-06-25 DIAGNOSIS — R531 Weakness: Secondary | ICD-10-CM | POA: Diagnosis not present

## 2018-06-25 LAB — CBC
HCT: 39.9 % (ref 36.0–46.0)
Hemoglobin: 11.9 g/dL — ABNORMAL LOW (ref 12.0–15.0)
MCH: 26.7 pg (ref 26.0–34.0)
MCHC: 29.8 g/dL — ABNORMAL LOW (ref 30.0–36.0)
MCV: 89.5 fL (ref 80.0–100.0)
Platelets: 267 10*3/uL (ref 150–400)
RBC: 4.46 MIL/uL (ref 3.87–5.11)
RDW: 15.5 % (ref 11.5–15.5)
WBC: 12.9 10*3/uL — ABNORMAL HIGH (ref 4.0–10.5)
nRBC: 0 % (ref 0.0–0.2)

## 2018-06-25 LAB — COMPREHENSIVE METABOLIC PANEL
ALT: 14 U/L (ref 0–44)
AST: 17 U/L (ref 15–41)
Albumin: 3.1 g/dL — ABNORMAL LOW (ref 3.5–5.0)
Alkaline Phosphatase: 71 U/L (ref 38–126)
Anion gap: 14 (ref 5–15)
BUN: 18 mg/dL (ref 8–23)
CO2: 22 mmol/L (ref 22–32)
Calcium: 9.4 mg/dL (ref 8.9–10.3)
Chloride: 103 mmol/L (ref 98–111)
Creatinine, Ser: 0.76 mg/dL (ref 0.44–1.00)
GFR calc Af Amer: >60 mL/min (ref >60)
GFR calc non Af Amer: >60 mL/min (ref >60)
Glucose, Bld: 128 mg/dL — ABNORMAL HIGH (ref 70–99)
Potassium: 4.1 mmol/L (ref 3.5–5.1)
Sodium: 139 mmol/L (ref 135–145)
Total Bilirubin: 0.5 mg/dL (ref 0.3–1.2)
Total Protein: 6.1 g/dL — ABNORMAL LOW (ref 6.5–8.1)

## 2018-06-25 LAB — URINALYSIS, ROUTINE W REFLEX MICROSCOPIC
Bilirubin Urine: NEGATIVE
Glucose, UA: NEGATIVE mg/dL
Hgb urine dipstick: NEGATIVE
Ketones, ur: NEGATIVE mg/dL
Leukocytes,Ua: NEGATIVE
Nitrite: NEGATIVE
Protein, ur: NEGATIVE mg/dL
Specific Gravity, Urine: 1.013 (ref 1.005–1.030)
pH: 7 (ref 5.0–8.0)

## 2018-06-25 LAB — PROTIME-INR
INR: 1.1 (ref 0.8–1.2)
Prothrombin Time: 14.2 seconds (ref 11.4–15.2)

## 2018-06-25 LAB — DIFFERENTIAL
Abs Immature Granulocytes: 0.07 10*3/uL (ref 0.00–0.07)
Basophils Absolute: 0.1 10*3/uL (ref 0.0–0.1)
Basophils Relative: 1 %
Eosinophils Absolute: 0.2 10*3/uL (ref 0.0–0.5)
Eosinophils Relative: 2 %
Immature Granulocytes: 1 %
Lymphocytes Relative: 24 %
Lymphs Abs: 3.1 10*3/uL (ref 0.7–4.0)
Monocytes Absolute: 0.9 10*3/uL (ref 0.1–1.0)
Monocytes Relative: 7 %
Neutro Abs: 8.5 10*3/uL — ABNORMAL HIGH (ref 1.7–7.7)
Neutrophils Relative %: 65 %

## 2018-06-25 LAB — APTT: aPTT: 31 seconds (ref 24–36)

## 2018-06-25 LAB — MAGNESIUM: Magnesium: 1.7 mg/dL (ref 1.7–2.4)

## 2018-06-25 LAB — GLUCOSE, CAPILLARY: Glucose-Capillary: 90 mg/dL (ref 70–99)

## 2018-06-25 LAB — I-STAT CREATININE, ED: Creatinine, Ser: 0.7 mg/dL (ref 0.44–1.00)

## 2018-06-25 LAB — SARS CORONAVIRUS 2 BY RT PCR (HOSPITAL ORDER, PERFORMED IN ~~LOC~~ HOSPITAL LAB): SARS Coronavirus 2: NEGATIVE

## 2018-06-25 LAB — CBG MONITORING, ED: Glucose-Capillary: 123 mg/dL — ABNORMAL HIGH (ref 70–99)

## 2018-06-25 LAB — PHOSPHORUS: Phosphorus: 3.7 mg/dL (ref 2.5–4.6)

## 2018-06-25 MED ORDER — POLYETHYLENE GLYCOL 3350 17 G PO PACK: 17.0000 g | PACK | Freq: Every day | ORAL | Status: DC | PRN

## 2018-06-25 MED ORDER — MIDODRINE HCL 5 MG PO TABS: 2.5000 mg | ORAL_TABLET | Freq: Two times a day (BID) | ORAL | Status: DC

## 2018-06-25 MED ORDER — SODIUM CHLORIDE 0.9% FLUSH
10.0000 mL | Freq: Two times a day (BID) | INTRAVENOUS | Status: DC
  Administered 2018-06-25 – 2018-06-26 (×2): 10 mL

## 2018-06-25 MED ORDER — METOPROLOL TARTRATE 25 MG PO TABS: 25.0000 mg | ORAL_TABLET | Freq: Two times a day (BID) | ORAL | Status: DC

## 2018-06-25 MED ORDER — ASPIRIN 81 MG PO TBEC: 81.0000 mg | DELAYED_RELEASE_TABLET | Freq: Every day | ORAL | Status: DC

## 2018-06-25 MED ORDER — VITAMIN D3 25 MCG (1000 UT) PO CAPS: 3000.0000 [IU] | ORAL_CAPSULE | Freq: Every day | ORAL | Status: DC

## 2018-06-25 MED ORDER — ADULT MULTIVITAMIN W/MINERALS CH: 1.0000 | ORAL_TABLET | Freq: Every day | ORAL | Status: DC

## 2018-06-25 MED ORDER — MAGNESIUM OXIDE 400 MG PO TABS: 400.0000 mg | ORAL_TABLET | Freq: Every day | ORAL | Status: DC

## 2018-06-25 MED ORDER — SIMVASTATIN 20 MG PO TABS: 40.0000 mg | ORAL_TABLET | Freq: Every day | ORAL | Status: DC

## 2018-06-25 MED ORDER — GABAPENTIN 100 MG PO CAPS: 100.0000 mg | ORAL_CAPSULE | Freq: Two times a day (BID) | ORAL | Status: DC

## 2018-06-25 MED ORDER — LORAZEPAM 2 MG/ML IJ SOLN: 2.0000 mg | INTRAMUSCULAR | Status: DC | PRN

## 2018-06-25 MED ORDER — DICLOFENAC SODIUM 1 % TD GEL
2.0000 g | Freq: Four times a day (QID) | TRANSDERMAL | Status: DC | PRN
  Administered 2018-06-26: 2 g via TOPICAL
  Filled 2018-06-25 (×2): qty 100

## 2018-06-25 MED ORDER — SODIUM CHLORIDE 0.9% FLUSH: 10.0000 mL | INTRAVENOUS | Status: DC | PRN

## 2018-06-25 MED ORDER — B COMPLEX-C PO TABS: 1.0000 | ORAL_TABLET | Freq: Every day | ORAL | Status: DC

## 2018-06-25 MED ORDER — FERROUS SULFATE 325 (65 FE) MG PO TABS: 325.0000 mg | ORAL_TABLET | ORAL | Status: DC

## 2018-06-25 MED ORDER — LEVOTHYROXINE SODIUM 25 MCG PO TABS: 25.0000 ug | ORAL_TABLET | Freq: Every day | ORAL | Status: DC

## 2018-06-25 MED ORDER — MELATONIN 3 MG PO TABS: 3.0000 mg | ORAL_TABLET | Freq: Every evening | ORAL | Status: DC | PRN

## 2018-06-25 MED ORDER — LEVETIRACETAM IN NACL 1000 MG/100ML IV SOLN
1000.0000 mg | Freq: Two times a day (BID) | INTRAVENOUS | Status: DC
  Administered 2018-06-26 – 2018-06-27 (×3): 1000 mg via INTRAVENOUS
  Filled 2018-06-25 (×4): qty 100

## 2018-06-25 MED ORDER — METOPROLOL TARTRATE 5 MG/5ML IV SOLN
2.5000 mg | Freq: Three times a day (TID) | INTRAVENOUS | Status: DC
  Administered 2018-06-25 – 2018-06-26 (×2): 2.5 mg via INTRAVENOUS
  Filled 2018-06-25 (×2): qty 5

## 2018-06-25 MED ORDER — SODIUM CHLORIDE 0.9 % IV SOLN
INTRAVENOUS | Status: DC
  Administered 2018-06-25 – 2018-06-27 (×3): via INTRAVENOUS

## 2018-06-25 MED ORDER — ENOXAPARIN SODIUM 40 MG/0.4ML ~~LOC~~ SOLN: 40.0000 mg | SUBCUTANEOUS | Status: DC

## 2018-06-25 MED ORDER — ACETAMINOPHEN 650 MG RE SUPP
650.0000 mg | Freq: Four times a day (QID) | RECTAL | Status: DC | PRN
  Filled 2018-06-25: qty 1

## 2018-06-25 MED ORDER — APIXABAN 2.5 MG PO TABS: 2.5000 mg | ORAL_TABLET | Freq: Two times a day (BID) | ORAL | Status: DC

## 2018-06-25 MED ORDER — PANTOPRAZOLE SODIUM 40 MG PO TBEC: 40.0000 mg | DELAYED_RELEASE_TABLET | Freq: Every day | ORAL | Status: DC

## 2018-06-25 MED ORDER — SODIUM CHLORIDE 0.9 % IV SOLN
2000.0000 mg | Freq: Once | INTRAVENOUS | Status: AC
  Administered 2018-06-25: 2000 mg via INTRAVENOUS
  Filled 2018-06-25: qty 20

## 2018-06-25 MED ORDER — ACETAMINOPHEN 325 MG PO TABS: 650.0000 mg | ORAL_TABLET | Freq: Four times a day (QID) | ORAL | Status: DC | PRN

## 2018-06-25 MED ORDER — VENLAFAXINE HCL ER 75 MG PO CP24: 150.0000 mg | ORAL_CAPSULE | Freq: Every day | ORAL | Status: DC

## 2018-06-25 MED ORDER — SODIUM CHLORIDE 0.9% FLUSH
3.0000 mL | Freq: Once | INTRAVENOUS | Status: AC
  Administered 2018-06-25: 3 mL via INTRAVENOUS

## 2018-06-25 MED ORDER — LEVOTHYROXINE SODIUM 100 MCG/5ML IV SOLN
12.5000 ug | Freq: Every day | INTRAVENOUS | Status: DC
  Administered 2018-06-25 – 2018-06-26 (×2): 12.5 ug via INTRAVENOUS
  Filled 2018-06-25 (×2): qty 5

## 2018-06-25 NOTE — ED Notes (Signed)
Pt incontinent of Stool pt cleaned and placed in clean brief. Pt has redden skin on sacrum area.

## 2018-06-25 NOTE — Procedures (Signed)
ELECTROENCEPHALOGRAM REPORT   Patient: Sonya [name]       Room #: 1T05W EEG No. ID: [id number] Age: 85 y.o.        Sex: female Referring Physician: Cheral Marker Report Date:  06/25/2018        Interpreting Physician: Alexis Goodell  History: Leilanny Fluitt is an 85 y.o. female with history of right hemispheric stroke presenting with left sided shaking  Medications:  Eliquis, ASA, Ferrous sulfate, Neurontin, Keppra, Synthroid, Lopressor, Midodrine, MVI, Protonix, Zocor, Effexor  Conditions of Recording:  This is a 21 channel routine scalp EEG performed with bipolar and monopolar montages arranged in accordance to the international 10/20 system of electrode placement. One channel was dedicated to EKG recording.  The patient is in the lethargic state.  Description:  The background activity is slow and poorly organized.  It consists of a low voltage, mixture or polymorphic delta and theta activity.  Delta activity is more prominent over the right hemisphere making the right hemispheric activity slower than the left.  This asymmetry is persistent throughout the recording.  The amplitude on the right hemisphere is lower than that on th left as well.   No epileptiform activity is noted.   Hyperventilation and intermittent photic stimulation were not performed.  IMPRESSION: This is an abnormal electroencephalogram secondary to right hemispheric slowing and decreased amplitude consistent with the patient's history of a right hemispheric infarct.  No epileptiform activity is noted.     Alexis Goodell, MD Neurology [phone] 06/25/2018, 5:39 PM

## 2018-06-25 NOTE — ED Triage Notes (Signed)
Pt arrived via [facility] after suddenly having a 3 minute period of becoming unconscious and progressively became more responsive as she arrived to hospital.  Pt taken directly to [place] with code stroke team. Pt is currently alert to person and place- residual left sided weakness from previous stroke.

## 2018-06-25 NOTE — ED Notes (Signed)
Spoke to Son, and updtated him on pt room # upstairs and gave him 3 to # [place]

## 2018-06-25 NOTE — Evaluation (Signed)
Physical Therapy Evaluation Patient Details Name: Sonya [name] MRN: [id number] DOB: [birth year] Today's Date: 06/25/2018   History of Present Illness  Pt is an 85 y/o female admitted secondary to likely seizure activity. EEG pending. CT negative for acute abnormality. PMH includes CVA with L hemiparesis, a fib, HTN, CAD, carotid endarterectomy.   Clinical Impression  Pt admitted secondary to problem above with deficits below. Pt requiring total A to perform bed mobility. RN on phone with pt's family after session and asked about PLOF and pt's family reports pt required max A to perform transfers to [facility] and L extremity deficits are baseline. If family able to provide necessary assist, recommend return to home with [facility] services. If unable to provide necessary assist, will require SNF. Will continue to follow acutely to maximize functional mobility independence and safety.     Follow Up Recommendations Home health PT;Supervision/Assistance - 24 hour(If family unable to provid assist will need SNF)    Equipment Recommendations  None recommended by PT    Recommendations for Other Services       Precautions / Restrictions Precautions Precautions: Fall;Other (comment) Precaution Comments: seizures Restrictions Weight Bearing Restrictions: No      Mobility  Bed Mobility Overal bed mobility: Needs Assistance Bed Mobility: Supine to Sit;Sit to Supine     Supine to sit: Total assist Sit to supine: Total assist   General bed mobility comments: Total A for assist with bed mobility. Pt with posterior lean in sitting and unable to maintain sitting balance without total A.   Transfers                    Ambulation/Gait                Stairs            Wheelchair Mobility    Modified Rankin (Stroke Patients Only)       Balance Overall balance assessment: Needs assistance Sitting-balance support: No upper extremity supported;Feet supported Sitting balance-Leahy  Scale: Zero Sitting balance - Comments: total A to maintain sitting balance.                                      Pertinent Vitals/Pain Pain Assessment: Faces Faces Pain Scale: Hurts even more Pain Location: L arm with movement Pain Descriptors / Indicators: Grimacing;Guarding;Moaning Pain Intervention(s): Limited activity within patient's tolerance;Monitored during session;Repositioned    Home Living Family/patient expects to be discharged to:: Private residence Living Arrangements: Children Available Help at Discharge: Family;Available 24 hours/day Type of Home: House Home Access: Ramped entrance     Home Layout: One level Home Equipment: Wheelchair - manual;Bedside commode;Cane - single point;Hospital bed      Prior Function Level of Independence: Needs assistance   Gait / Transfers Assistance Needed: Per pt's Son, he assist with bed mobility and transfers, pt non-ambulatory  ADL's / Homemaking Assistance Needed: Per previous notes, pt required assist for all ADL tasks.         Hand Dominance        Extremity/Trunk Assessment   Upper Extremity Assessment Upper Extremity Assessment: Defer to OT evaluation;LUE deficits/detail LUE Deficits / Details: LUE pain when attempting to move LUE. WEakness at baseline secondary to previous CVA.     Lower Extremity Assessment Lower Extremity Assessment: LLE deficits/detail LLE Deficits / Details: Pt with hamstring contracture and unable to fully straighten LLE. Pt with weakness  at baseline secondary to CVA.     Cervical / Trunk Assessment Cervical / Trunk Assessment: Kyphotic  Communication   Communication: No difficulties  Cognition Arousal/Alertness: Lethargic Behavior During Therapy: Flat affect Overall Cognitive Status: No family/caregiver present to determine baseline cognitive functioning                                 General Comments: Pt oriented to person and place. When asked why  she was here, she said she was feeling really bad. When asked what year it was pt stated 1953. Pt responding to questions, however, kept eyes closed most of session.       General Comments      Exercises     Assessment/Plan    PT Assessment Patient needs continued PT services  PT Problem List Decreased strength;Decreased range of motion;Decreased balance;Decreased activity tolerance;Decreased mobility;Decreased knowledge of use of DME;Decreased cognition;Decreased safety awareness;Decreased knowledge of precautions       PT Treatment Interventions DME instruction;Gait training;Functional mobility training;Therapeutic activities;Therapeutic exercise;Balance training;Patient/family education    PT Goals (Current goals can be found in the Care Plan section)  Acute Rehab PT Goals PT Goal Formulation: Patient unable to participate in goal setting Time For Goal Achievement: 07/09/18 Potential to Achieve Goals: Fair    Frequency Min 3X/week   Barriers to discharge        Co-evaluation               AM-PAC PT "6 Clicks" Mobility  Outcome Measure Help needed turning from your back to your side while in a flat bed without using bedrails?: Total Help needed moving from lying on your back to sitting on the side of a flat bed without using bedrails?: Total Help needed moving to and from a bed to a chair (including a wheelchair)?: Total Help needed standing up from a chair using your arms (e.g., wheelchair or bedside chair)?: Total Help needed to walk in hospital room?: Total Help needed climbing 3-5 steps with a railing? : Total 6 Click Score: 6    End of Session   Activity Tolerance: Patient limited by lethargy Patient left: in bed;with call bell/phone within reach;with bed alarm set;with nursing/sitter in room Nurse Communication: Mobility status PT Visit Diagnosis: Unsteadiness on feet (R26.81);Muscle weakness (generalized) (M62.81);Difficulty in walking, not elsewhere  classified (R26.2)    Time: 8413-2440 PT Time Calculation (min) (ACUTE ONLY): 13 min   Charges:   PT Evaluation $PT Eval Moderate Complexity: [place], PT, DPT  Acute Rehabilitation Services  Pager: [phone] Office: [phone]   Rudean Hitt 06/25/2018, 5:18 PM

## 2018-06-25 NOTE — Code Documentation (Signed)
85yo female arriving to [facility] via [place] at [phone]. Patient from home where she was working with PT at 1015 this morning when she began having left arm and leg "quivering" during therapy. Son reports patient was doing well at her baseline up until this point. He notes her eyes rolled back, and she began gurgling following the "quivering." EMS was called, assessed patient to be unresponsive and activated a code stroke due to altered mental status. Of note, patient with h/o right MCA infarct in August 2019 with resultant left hemiparesis. She underwent left carotid endarterectomy on 05/19/2018. She takes Eliquis 2.5mg  BID, last dose this morning per son. Stroke team at the bedside on patient arrival. Labs drawn and patient cleared for CT by Dr. Francia Greaves. Patient to CT with team. CT completed. Patient was noted to be incontinent of stool. NIHSS 11, see documentation for details and code stroke times. Patient with left hemiparesis and left facial droop on exam which appear to be consistent with her baseline deficits. Patient with suspected seizure. Patient is contraindicated for treatment with tPA d/t last dose Eliquis this morning. Code stroke canceled. Bedside handoff with ED RN Janett Billow. ED RN to update patient's son on plan of care.

## 2018-06-25 NOTE — H&P (Addendum)
Family Medicine Teaching [facility] Admission History and Physical Service Pager: [phone]  Patient name: Sonya [name] Medical record number: [id number] Date of birth: 1932-08-11 Age: 85 y.o. Gender: female  Primary Care Provider: Gracelyn Nurse, MD Consultants: CONSULT FOR UNASSIGNED MEDICAL ADMISSION Code Status: Full, confirmed on admission Preferred Emergency Contact :   Contact Information    Name Relation Home Work [place] [phone]    [place], [place]   [phone]     Chief Complaint: Code Stroke   Assessment and Plan: Sonya [name] is a 85 y.o. 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 fib on Eliquis, vasovagal symptoms could be, ICA stenosis, carotid endarterectomy, hypothyroidism, hypertension, depression, HLD, dysphagia    # Acute Neurologic Event  Patient was admitted for acute neurologic event that occurred during physical therapy today.  Reportedly, patient had left upper and lower extremity shaking and was unresponsive for 3 to 5 minutes.  Patient has history of right parietal frontal stroke with residual left-sided hemiparesis and left-sided facial droop and is bedbound.  On arrival to the ED, VS s/f BP 160/99, T 99.6, HR 97, 95% on room air. Chem 10 was in WNL, CBC was significant for white count of 12.9, hemoglobin 11.9, ANC 8.5.  PT INR 14.2/1.1, APTT 31.  UA was negative.  CT head was unrevealing for any acute changes.  On initial EKG, patient is in sinus rhythm.  And physical exam is largely benign significant only for residual left upper and lower extremity weakness.  Neurology was consulted in the ED and suspects this event was likely a seizure, given her post ictal periods.  Patient was loaded with 2 g Keppra x2.. At home, patient taking 2-1/2 mg Eliquis twice daily, ASA 81.  . Admit to FMTS, attending Dr. Lum Babe. Level of care:  telemetry . Continuous cardiac monitoring  . Vitals per unit routine  . Seizure precautions . Follow-up neurology recommendations . Follow-up EEG . AM Labs: CBC with differential, CMP . A.m. EKG . PT/OT/SLP evaluation-PT has seen patient and recommend HH PT . Loaded with Keppra 2 g in ED, continue IV Keppra per neurology . IV Ativan 2 mg as needed seizure activity . Frequent neuro checks  # Dysphagia Patient with history of dysphasia in her chart and fails swallow study on admission.  Patient has several oral medications that will be unable to take if she is unable to pass a second swallow screen.  Patient was postictal at the time of initial swallow screen, perhaps she will improve as she is more alert.  Patient has failed bedside swallow x2, will remain n.p.o. and start home meds in IV form.  If she doesn't pass, continue IVF  Pt will need eliquis   # Hx R Parietal Frontal CVA Deficits note on PE as above. Bed bound.   Holding home gabapentin 100mg  BID, ASA and eliquis when patient can take PO   Continue PT/OT/SLP  #Leukocytosis WBC 12.9 upon arrival.  Neutrophils elevated to 8.5.  Patient afebrile on admission however initial temperature to 99.6.  No current signs of infection and UA WNL.    Continue to monitor for signs of developing infection  Repeat CBC with differential in a.m.  #Question of possible Afib  In sinus rhythm on admission.  Patient thought to have had stroke secondary to atrial fibrillation however this was never proven although neuro had and differential.  Per chart review,  patient was supposed to get a 30-day Holter monitor, but never received per notes in October 2019.  Telemetry  Continue Eliquis when patient can take PO   If patient unable to take PO, call pharmacy for IV recs on 5/9  # Anemia, chronic  Hemoglobin 11.9 and stable on admission  Continue 325 mg iron every other w/ meal when able to take PO   # Hypothyroidism  At home on  Synthroid 25 mcg when able to take PO   # HTN Hypertensive in the ED  Continue home Lopressor 25mg  BID, midodrine 2.5mg  BID when able to take PO  Pharmacy recommending that patient be placed on IV metoprolol 2.5 mg while NPO.  May resume home medications as able.  # HLD   Continue home simvastatin 40 mg when taking PO  # Depression  Continue home venlafaxine 150mg  XR when taking PO  # Vitamin D deficiency  Continue home Vitamin D when able to take PO.    # Hypomagnesemia Mg WNL on admission.   Magnesium oxide 400mg  daily when able to take PO   # GERD   Continue home zofran 4mg  PRN When taking PO    # Constipation    MiraLAX unable to take PO  #FEN/GI:  . Fluids: IVF @75mL /hr . Electrolytes: wnl. BMP in AM  . Nutrition:  NPO. Failed swallow x 2.   Access: R PIV  VTE prophylaxis: Patient on Eliquis and aspirin at home however unable to take PO. Lovenox for now.  Disposition: Admit to tele.  ============================================================================= HPI Sonya [name] is a 85 y.o. 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 fib on Eliquis, vasovagal symptoms could be, ICA stenosis, carotid endarterectomy, hypothyroidism, hypertension, depression, HLD, dysphagia who presents from home.  Patient was working with physical therapy and was suddenly noted to be shaking her left upper and lower extremities which lasted 3 to 5 minutes.  During the event, she was noted to be unresponsive.  In EMS, patient was minimally responsive and by the time she got to the ED, she was improved and oriented to herself.  Neuro was consulted in the ED and loaded her with Keppra for concern of new onset seizures.  CT was negative for acute abnormality and showed old stroke.   HPI was largely gathered by ED signout, as patient has no recollection of the event.  In the ED,  patient originally presented as code stroke.  Neurology was consulted and deemed the patient not a candidate for TPA.  Patient's activity was more consistent with a new onset seizure type of picture and patient was loaded back with Keppra.  Patient failed her swallow test.  COVID-19 was negative Abnormal Labs Reviewed  CBC - Abnormal; Notable for the following components:      Result Value   WBC 12.9 (*)    Hemoglobin 11.9 (*)    MCHC 29.8 (*)    All other components within normal limits  DIFFERENTIAL - Abnormal; Notable for the following components:   Neutro Abs 8.5 (*)    All other components within normal limits  COMPREHENSIVE METABOLIC PANEL - Abnormal; Notable for the following components:   Glucose, Bld 128 (*)    Total Protein 6.1 (*)    Albumin 3.1 (*)    All other components within normal limits  CBG MONITORING, ED - Abnormal; Notable for the following components:   Glucose-Capillary 123 (*)  All other components within normal limits   Review Of Systems: Review of Systems  Unable to perform ROS: Mental acuity  Constitutional: Negative for chills and fever.  Eyes: Negative for blurred vision and double vision.  Respiratory: Negative for cough and shortness of breath.   Cardiovascular: Negative for chest pain.  Gastrointestinal: Negative for diarrhea and vomiting.  Genitourinary: Negative for dysuria.  Musculoskeletal: Positive for joint pain and myalgias.  Neurological: Negative for focal weakness and headaches.    Patient Active Problem List   Diagnosis Date Noted  . Seizure (HCC) 06/25/2018  . Carotid stenosis, left 05/19/2018  . Carotid stenosis 04/25/2018  . Hypotension 04/10/2018  . Protein-calorie malnutrition, severe 03/30/2018  . Hypokalemia 03/26/2018  . Coffee ground emesis   . AVM (arteriovenous malformation) of stomach, acquired with hemorrhage   . Upper GI bleed 03/22/2018  . Pressure injury of skin 02/22/2018  . Decubitus ulcer of left ankle,  unstageable (HCC) 02/22/2018  . Sepsis (HCC) 02/21/2018  . Calculus of common duct without obstruction   . Abnormal findings on diagnostic imaging of liver   . Problems with swallowing and mastication   . Acute gastritis without hemorrhage   . Stricture and stenosis of esophagus   . AKI (acute kidney injury) (HCC) 11/05/2017  . Nausea & vomiting 11/05/2017  . Choledocholithiasis   . Dysphasia   . Abdominal pain 10/30/2017  . Cerebral embolism with cerebral infarction 10/13/2017  . CVA (cerebral vascular accident) (HCC) 10/13/2017  . Hypertension 10/12/2017  . Hypertensive urgency 10/12/2017  . Hypothyroidism 10/12/2017  . Left-sided weakness 10/12/2017  . Elevated troponin 10/12/2017  . Unknown when suspected stroke patient was last well 10/12/2017  . History of depression 12/05/2016  . Hypothyroidism due to acquired atrophy of thyroid 06/05/2014  . Chronic renal insufficiency 11/28/2013  . GERD (gastroesophageal reflux disease) 11/28/2013  . H/O vitamin D deficiency 11/28/2013  . History of ischemic heart disease 11/28/2013  . Hyperlipidemia 11/28/2013   Past Medical History: Past Medical History:  Diagnosis Date  . CAD (coronary artery disease)   . Carpal tunnel syndrome   . GERD (gastroesophageal reflux disease)   . History of shingles   . Hormone receptor positive breast cancer (HCC) 1960  . Hyperlipemia   . Hypertension   . Hypothyroidism   . Stroke [facility])    left sided weakness    Past Surgical History: Past Surgical History:  Procedure Laterality Date  . ABDOMINAL HYSTERECTOMY    . APPENDECTOMY    . CAROTID ANGIOGRAPHY Bilateral 03/31/2018   Procedure: CAROTID ANGIOGRAPHY;  Surgeon: Annice Needy, MD;  Location: [facility] INVASIVE CV LAB;  Service: Cardiovascular;  Laterality: Bilateral;  . cornoary angioplasty    . ENDARTERECTOMY Left 05/19/2018   Procedure: ENDARTERECTOMY CAROTID;  Surgeon: Annice Needy, MD;  Location: [facility] ORS;  Service: Vascular;  Laterality:  Left;  . ENDOSCOPIC RETROGRADE CHOLANGIOPANCREATOGRAPHY (ERCP) WITH PROPOFOL N/A 11/10/2017   Procedure: ENDOSCOPIC RETROGRADE CHOLANGIOPANCREATOGRAPHY (ERCP) WITH PROPOFOL;  Surgeon: Midge Minium, MD;  Location: [facility] ENDOSCOPY;  Service: Endoscopy;  Laterality: N/A;  . ESOPHAGOGASTRODUODENOSCOPY N/A 03/23/2018   Procedure: ESOPHAGOGASTRODUODENOSCOPY (EGD);  Surgeon: Toney Reil, MD;  Location: [facility] ENDOSCOPY;  Service: Gastroenterology;  Laterality: N/A;  . ESOPHAGOGASTRODUODENOSCOPY (EGD) WITH PROPOFOL N/A 11/06/2017   Procedure: ESOPHAGOGASTRODUODENOSCOPY (EGD) WITH PROPOFOL;  Surgeon: Midge Minium, MD;  Location: [facility] ENDOSCOPY;  Service: Endoscopy;  Laterality: N/A;  . PATCH ANGIOPLASTY Left 05/19/2018   Procedure: PATCH ANGIOPLASTY;  Surgeon: Annice Needy, MD;  Location: [facility]  ORS;  Service: Vascular;  Laterality: Left;    Family History: family history includes Hypertension in her son; Ovarian cancer in her mother; Stroke in her father and son.   Social History: Social History   Social History Narrative   Currently at home, bedbound mostly, son helping    Sonya [name] reports that she quit smoking about 20 years ago. Her smoking use included cigarettes. She has a 10.00 pack-year smoking history. She has never used smokeless tobacco. She reports previous alcohol use. She reports that she does not use drugs.  Allergies and Medications: Allergies  Allergen Reactions  . Clopidogrel Nausea And Vomiting     reported by [facility] 11/24/13  . Omeprazole Other (See Comments)    Unknown reaction - reported by [facility] 11/24/13   No outpatient medications have been marked as taking for the 06/25/18 encounter [facility] Encounter).    Objective: BP (!) 175/84   Pulse 85   Temp 99.6 F (37.6 C) (Rectal)   Resp 15   SpO2 96%  There were no vitals filed for this visit.  Exam: Physical Exam Constitutional:      General: She is not in acute  distress.    Appearance: She is not toxic-appearing.     Comments: Sleepy, lying with eyes closed.  Thin Caucasian female.  HENT:     Head: Normocephalic and atraumatic.     Nose: Nose normal.     Mouth/Throat:     Mouth: Mucous membranes are moist.     Pharynx: Oropharynx is clear. No oropharyngeal exudate or posterior oropharyngeal erythema.  Eyes:     General: No scleral icterus.       Right eye: No discharge.        Left eye: No discharge.     Conjunctiva/sclera: Conjunctivae normal.  Neck:     Musculoskeletal: Neck supple.     Comments: Vertical scar on border of left sternocleidomastoid Cardiovascular:     Rate and Rhythm: Normal rate and regular rhythm.     Heart sounds: Normal heart sounds.  Pulmonary:     Effort: Pulmonary effort is normal.     Breath sounds: Normal breath sounds.  Abdominal:     General: Abdomen is flat.     Palpations: Abdomen is soft.  Lymphadenopathy:     Cervical: No cervical adenopathy.      Labs and Imaging: I have personally reviewed following labs and imaging studies CBC: Recent Labs  Lab 06/25/18 1112  WBC 12.9*  NEUTROABS 8.5*  HGB 11.9*  HCT 39.9  MCV 89.5  PLT 267   CMP: Recent Labs  Lab 06/25/18 1112 06/25/18 1114  NA 139  --   K 4.1  --   CL 103  --   CO2 22  --   GLUCOSE 128*  --   BUN 18  --   CREATININE 0.76 0.70  CALCIUM 9.4  --   ALBUMIN 3.1*  --      GFR: CrCl cannot be calculated (Unknown ideal weight.).  Liver Function Tests: Recent Labs  Lab 06/25/18 1112  AST 17  ALT 14  ALKPHOS 71  BILITOT 0.5  PROT 6.1*  ALBUMIN 3.1*   Coagulation Profile: Recent Labs  Lab 06/25/18 1112  INR 1.1   CBG: Recent Labs  Lab 06/25/18 1108  GLUCAP 123*    Imaging/Diagnostic Tests: Ct Head Code Stroke Wo Contrast Result Date: 06/25/2018 MPRESSION: 1. No acute finding. 2. Large remote right MCA branch  infarct.    EKG Interpretation  Date/Time:  Friday Jun 25 2018 11:34:19 EDT Ventricular Rate:   97 PR Interval:    QRS Duration: 76 QT Interval:  346 QTC Calculation: 440 R Axis:   -14 Text Interpretation:  Sinus rhythm Probable left atrial enlargement Abnormal R-wave progression, early transition Probable left ventricular hypertrophy Inferior infarct, old Artifact in lead(s) V1 V2 Confirmed by Kristine Royal [phone]) on 06/25/2018 11:40:00 AM Also confirmed by Kristine Royal [phone]), editor Barbette Hair [phone])  on 06/25/2018 12:27:22 PM       Melene Plan, M.D. 06/25/2018, 1:15 PM PGY-1, [facility] Family Medicine FPTS Intern pager: [phone], text pages welcome  FPTS Upper-Level Resident Addendum   I have independently interviewed and examined the patient. I have discussed the above with the original author and agree with their documentation. My edits for correction/addition/clarification are in purple. Please see also any attending notes.    [place] Gabriele Loveland, DO PGY-2, [facility] Family Medicine 06/25/2018 7:54 PM  FPTS Service pager: [phone] (text pages welcome through [facility])

## 2018-06-25 NOTE — ED Provider Notes (Signed)
[facility] EMERGENCY DEPARTMENT Provider Note   CSN: [id number] Arrival date & time: 06/25/18  1103  An emergency department physician performed an initial assessment on this suspected stroke patient at 1103(messick).  History   Chief Complaint Chief Complaint  Patient presents with  . Code Stroke    HPI Sonya [name] is a 85 y.o. female.     85 year old female with prior medical history as detailed below presents for evaluation as a possible code stroke.  Patient was with physical therapy this morning.  She was observed to suddenly become unresponsive.  There was rapid twitching noted of the left arm and left leg.  Patient has a prior history of stroke with residual deficit in left arm and left leg.  EMS reports the patient was minimally responsive upon their initial evaluation.  Upon arrival to the ED the patient is more responsive.  She is able to repeat her name when asked.  Per EMS, patient with residual deficits in left arm and left leg.  Patient is apparently not able to walk at baseline.  Patient without prior history of seizure.  Patient is not currently on seizure medications.  She is on Eliquis -- her last dose of Eliquis was this morning.  The history is provided by the patient and medical records.  Loss of Consciousness  Episode history:  Single Most recent episode:  Today Duration:  5 minutes Timing:  Rare Progression:  Partially resolved Chronicity:  New Witnessed: yes   Relieved by:  Nothing Worsened by:  Nothing   Past Medical History:  Diagnosis Date  . CAD (coronary artery disease)   . Carpal tunnel syndrome   . GERD (gastroesophageal reflux disease)   . History of shingles   . Hormone receptor positive breast cancer ([place]) 1960  . Hyperlipemia   . Hypertension   . Hypothyroidism   . Stroke [facility])    left sided weakness    Patient Active Problem List   Diagnosis Date Noted  . Carotid stenosis, left 05/19/2018  . Carotid  stenosis 04/25/2018  . Hypotension 04/10/2018  . Protein-calorie malnutrition, severe 03/30/2018  . Hypokalemia 03/26/2018  . Coffee ground emesis   . AVM (arteriovenous malformation) of stomach, acquired with hemorrhage   . Upper GI bleed 03/22/2018  . Pressure injury of skin 02/22/2018  . Decubitus ulcer of left ankle, unstageable ([place]) 02/22/2018  . Sepsis ([place]) 02/21/2018  . Calculus of common duct without obstruction   . Abnormal findings on diagnostic imaging of liver   . Problems with swallowing and mastication   . Acute gastritis without hemorrhage   . Stricture and stenosis of esophagus   . AKI (acute kidney injury) ([place]) 11/05/2017  . Nausea & vomiting 11/05/2017  . Choledocholithiasis   . Dysphasia   . Abdominal pain 10/30/2017  . Cerebral embolism with cerebral infarction 10/13/2017  . CVA (cerebral vascular accident) ([place]) 10/13/2017  . Hypertension 10/12/2017  . Hypertensive urgency 10/12/2017  . Hypothyroidism 10/12/2017  . Left-sided weakness 10/12/2017  . Elevated troponin 10/12/2017  . Unknown when suspected stroke patient was last well 10/12/2017  . History of depression 12/05/2016  . Hypothyroidism due to acquired atrophy of thyroid 06/05/2014  . Chronic renal insufficiency 11/28/2013  . GERD (gastroesophageal reflux disease) 11/28/2013  . H/O vitamin D deficiency 11/28/2013  . History of ischemic heart disease 11/28/2013  . Hyperlipidemia 11/28/2013    Past Surgical History:  Procedure Laterality Date  . ABDOMINAL HYSTERECTOMY    . APPENDECTOMY    .  CAROTID ANGIOGRAPHY Bilateral 03/31/2018   Procedure: CAROTID ANGIOGRAPHY;  Surgeon: Algernon Huxley, MD;  Location: [facility];  Service: Cardiovascular;  Laterality: Bilateral;  . cornoary angioplasty    . ENDARTERECTOMY Left 05/19/2018   Procedure: ENDARTERECTOMY CAROTID;  Surgeon: Algernon Huxley, MD;  Location: [facility] ORS;  Service: Vascular;  Laterality: Left;  . ENDOSCOPIC RETROGRADE  CHOLANGIOPANCREATOGRAPHY (ERCP) WITH PROPOFOL N/A 11/10/2017   Procedure: ENDOSCOPIC RETROGRADE CHOLANGIOPANCREATOGRAPHY (ERCP) WITH PROPOFOL;  Surgeon: Lucilla Lame, MD;  Location: [facility] ENDOSCOPY;  Service: Endoscopy;  Laterality: N/A;  . ESOPHAGOGASTRODUODENOSCOPY N/A 03/23/2018   Procedure: ESOPHAGOGASTRODUODENOSCOPY (EGD);  Surgeon: Lin Landsman, MD;  Location:  [facility] ENDOSCOPY;  Service: Gastroenterology;  Laterality: N/A;  . ESOPHAGOGASTRODUODENOSCOPY (EGD) WITH PROPOFOL N/A 11/06/2017   Procedure: ESOPHAGOGASTRODUODENOSCOPY (EGD) WITH PROPOFOL;  Surgeon: Lucilla Lame, MD;  Location: [facility] ENDOSCOPY;  Service: Endoscopy;  Laterality: N/A;  . PATCH ANGIOPLASTY Left 05/19/2018   Procedure: PATCH ANGIOPLASTY;  Surgeon: Algernon Huxley, MD;  Location: [facility] ORS;  Service: Vascular;  Laterality: Left;     OB History   No obstetric history on file.      Home Medications    Prior to Admission medications   Medication Sig Start Date End Date Taking? Authorizing Provider  acetaminophen (TYLENOL) 325 MG suppository Place 1 suppository (325 mg total) rectally every 4 (four) hours as needed for mild pain (or temp >/= 101 F). 05/20/18   Stegmayer, Janalyn Harder, PA-C  acetaminophen (TYLENOL) 325 MG tablet Take 1 tablet (325 mg total) by mouth every 4 (four) hours as needed for mild pain (or temp >/= 101 F). 05/20/18   Stegmayer, Janalyn Harder, PA-C  apixaban (ELIQUIS) 2.5 MG TABS tablet Take 1 tablet (2.5 mg total) by mouth 2 (two) times daily. 03/24/18   Epifanio Lesches, MD  aspirin EC 81 MG EC tablet Take 1 tablet (81 mg total) by mouth daily. 05/20/18   Stegmayer, Joelene Millin A, PA-C  B Complex-C (B-COMPLEX WITH VITAMIN C) tablet Take 1 tablet by mouth daily. Patient taking differently: Take 1 tablet by mouth 2 (two) times daily.  03/28/18   Gouru, Illene Silver, MD  bisacodyl (DUCODYL) 5 MG EC tablet Take 2 tablets (10 mg total) by mouth daily. Patient taking differently: Take 5 mg by mouth 2 (two) times daily.  02/24/18    Stark Jock Jude, MD  Cholecalciferol (VITAMIN D3) 1000 units CAPS Take 3,000 Units by mouth at bedtime.  09/07/17   [provider]  ferrous sulfate 325 (65 FE) MG tablet Take 1 tablet (325 mg total) by mouth 2 (two) times daily with a meal. Patient taking differently: Take 325 mg by mouth every other day.  04/12/18   Gladstone Lighter, MD  gabapentin (NEURONTIN) 100 MG capsule Take 100 mg by mouth 2 (two) times daily.  03/15/18 03/15/19  [provider]  lansoprazole (PREVACID) 30 MG capsule Take 1 capsule (30 mg total) by mouth daily. Patient taking differently: Take 30 mg by mouth 2 (two) times daily before a meal.  03/24/18 03/24/19  Epifanio Lesches, MD  levothyroxine (SYNTHROID, LEVOTHROID) 25 MCG tablet Take 25 mcg by mouth daily before breakfast.  09/10/17   [provider]  magnesium oxide (MAG-OX) 400 MG tablet Take 400 mg by mouth daily.    [provider]  Melatonin 3 MG TABS Take 3 mg by mouth at bedtime as needed (sleep).     [provider]  metoprolol tartrate (LOPRESSOR) 25 MG tablet Take 1 tablet (25 mg total) by mouth  2 (two) times daily. 03/27/18   Nicholes Mango, MD  midodrine (PROAMATINE) 2.5 MG tablet Take 1 tablet (2.5 mg total) by mouth 2 (two) times daily with a meal. 04/12/18   Gladstone Lighter, MD  ondansetron (ZOFRAN-ODT) 4 MG disintegrating tablet Take 1 tablet (4 mg total) by mouth every 8 (eight) hours as needed for nausea or vomiting. 02/24/18   Stark Jock Jude, MD  polyethylene glycol (MIRALAX / GLYCOLAX) packet Take 17 g by mouth daily as needed for moderate constipation. 11/16/17   Loletha Grayer, MD  simvastatin (ZOCOR) 40 MG tablet Take 1 tablet (40 mg total) by mouth daily at 6 PM. 02/24/18   Stark Jock, Jude, MD  venlafaxine XR (EFFEXOR-XR) 150 MG 24 hr capsule Take 150 mg by mouth at bedtime. 09/10/17   [provider]    Family History Family History  Problem Relation Age of Onset  . Hypertension Son   . Ovarian cancer Mother    . Stroke Father   . Stroke Son     Social History Social History   Tobacco Use  . Smoking status: Former Smoker    Packs/day: 0.50    Years: 20.00    Pack years: 10.00    Types: Cigarettes    Last attempt to quit: 02/17/1998    Years since quitting: 20.3  . Smokeless tobacco: Never Used  Substance Use Topics  . Alcohol use: Not Currently  . Drug use: Never     Allergies   Clopidogrel and Omeprazole   Review of Systems Review of Systems  Unable to perform ROS: Acuity of condition  Cardiovascular: Positive for syncope.     Physical Exam Updated Vital Signs BP (!) 178/79   Pulse 90   Temp 99.6 F (37.6 C) (Rectal)   Resp 19   SpO2 94%   Physical Exam Vitals signs and nursing note reviewed.  Constitutional:      General: She is not in acute distress.    Appearance: She is well-developed.  HENT:     Head: Normocephalic and atraumatic.  Eyes:     Conjunctiva/sclera: Conjunctivae normal.     Pupils: Pupils are equal, round, and reactive to light.  Neck:     Musculoskeletal: Normal range of motion and neck supple.  Cardiovascular:     Rate and Rhythm: Normal rate and regular rhythm.     Heart sounds: Normal heart sounds.  Pulmonary:     Effort: Pulmonary effort is normal. No respiratory distress.     Breath sounds: Normal breath sounds.  Abdominal:     General: There is no distension.     Palpations: Abdomen is soft.     Tenderness: There is no abdominal tenderness.  Musculoskeletal: Normal range of motion.        General: No deformity.  Skin:    General: Skin is warm and dry.  Neurological:     Mental Status: She is alert and oriented to person, place, and time. Mental status is at baseline.     Comments: Left-sided hemiparesis.      ED Treatments / Results  Labs (all labs ordered are listed, but only abnormal results are displayed) Labs Reviewed  CBC - Abnormal; Notable for the following components:      Result Value   WBC 12.9 (*)     Hemoglobin 11.9 (*)    MCHC 29.8 (*)    All other components within normal limits  DIFFERENTIAL - Abnormal; Notable for the following components:   Neutro Abs 8.5 (*)  All other components within normal limits  COMPREHENSIVE METABOLIC PANEL - Abnormal; Notable for the following components:   Glucose, Bld 128 (*)    Total Protein 6.1 (*)    Albumin 3.1 (*)    All other components within normal limits  CBG MONITORING, ED - Abnormal; Notable for the following components:   Glucose-Capillary 123 (*)    All other components within normal limits  SARS CORONAVIRUS 2 (HOSPITAL ORDER, [facility])  PROTIME-INR  APTT  URINALYSIS, ROUTINE W REFLEX MICROSCOPIC  I-STAT CREATININE, ED    EKG EKG Interpretation  Date/Time:  Friday Jun 25 2018 11:34:19 EDT Ventricular Rate:  97 PR Interval:    QRS Duration: 76 QT Interval:  346 QTC Calculation: 440 R Axis:   -14 Text Interpretation:  Sinus rhythm Probable left atrial enlargement Abnormal R-wave progression, early transition Probable left ventricular hypertrophy Inferior infarct, old Artifact in lead(s) V1 V2 Confirmed by Dene Gentry [phone]) on 06/25/2018 11:40:00 AM Also confirmed by Dene Gentry [phone]), editor Philomena Doheny [phone])  on 06/25/2018 12:27:22 PM   Radiology Ct Head Code Stroke Wo Contrast  Result Date: 06/25/2018 CLINICAL DATA:  Code stroke.  Unresponsive with facial droop. EXAM: CT HEAD WITHOUT CONTRAST TECHNIQUE: Contiguous axial images were obtained from the base of the skull through the vertex without intravenous contrast. COMPARISON:  04/10/2018 FINDINGS: Brain: No evidence of acute infarction, hemorrhage, hydrocephalus, extra-axial collection or mass lesion/mass effect. Large remote right MCA territory infarct in the superior division primarily affecting the parietal and posterior frontal lobes. Age congruent cerebral volume loss. Vascular: Atherosclerotic calcification.  No hyperdense vessel.  Skull: Negative Sinuses/Orbits: Bilateral cataract resection Other: These results were communicated to Dr. Cheral Marker at 11:27 amon 5/8/2020by text page via the [facility] messaging system. ASPECTS [facility] Stroke Program Early CT Score) Not scored with this nonspecific history. IMPRESSION: 1. No acute finding. 2. Large remote right MCA branch infarct. Electronically Signed   By: Monte Fantasia M.D.   On: 06/25/2018 11:28    Procedures Procedures (including critical care time) CRITICAL CARE Performed by: Valarie Merino   Total critical care time: 30 minutes  Critical care time was exclusive of separately billable procedures and treating other patients.  Critical care was necessary to treat or prevent imminent or life-threatening deterioration.  Critical care was time spent personally by me on the following activities: development of treatment plan with patient and/or surrogate as well as nursing, discussions with consultants, evaluation of patient's response to treatment, examination of patient, obtaining history from patient or surrogate, ordering and performing treatments and interventions, ordering and review of laboratory studies, ordering and review of radiographic studies, pulse oximetry and re-evaluation of patient's condition.   Medications Ordered in ED Medications  sodium chloride flush (NS) 0.9 % injection 3 mL (has no administration in time range)  levETIRAcetam (KEPPRA) 2,000 mg in sodium chloride 0.9 % 100 mL IVPB (has no administration in time range)     Initial Impression / Assessment and Plan / ED Course  I have reviewed the triage vital signs and the nursing notes.  Pertinent labs & imaging results that were available during my care of the patient were reviewed by me and considered in my medical decision making (see chart for details).       MDM  Screen complete  Sonya [name] was evaluated in Emergency Department on 06/25/2018 for the symptoms described in the  history of present illness. She was evaluated in the context of the global  COVID-19 pandemic, which necessitated consideration that the patient might be at risk for infection with the SARS-CoV-2 virus that causes COVID-19. Institutional protocols and algorithms that pertain to the evaluation of patients at risk for COVID-19 are in a state of rapid change based on information released by regulatory bodies including the CDC and federal and state organizations. These policies and algorithms were followed during the patient's care in the ED.  Patient is presenting as a code stroke.  Patient with initial presentation that is perhaps most consistent with new onset seizure activity.  Patient was evaluated upon arrival by the Neuro team.  Patient does not meet criteria for TPA.  Keppra load given per neuro request.  Patient will be admitted for further work-up and observation. Family medicine is aware of case and will evaluate.    Final Clinical Impressions(s) / ED Diagnoses   Final diagnoses:  Seizure [facility])    ED Discharge Orders    None       Valarie Merino, MD 06/25/18 1248

## 2018-06-25 NOTE — H&P (Signed)
Patient evaluated by me. I have discussed plan with the resident. I will co-sign their note once it is completed.

## 2018-06-25 NOTE — Consult Note (Signed)
NEURO HOSPITALIST CONSULT NOTE   Requesting physician: Dr. Francia Greaves  Reason for Consult: AMS with left sided seizure-like activity.   History obtained from:  EMS and Chart     HPI:                                                                                                                                          Sonya [name] is an 85 y.o. female with a history of right parietal-frontal stroke with residual left hemiparesis, on Eliquis for concern of paroxysmal atrial fibrillation, vasovagal syncope, high grade stenosis of ICAs, recent carotid endarterectomy and hypothyroidism, who presents from home via EMS today after PT who was visiting for a therapy session noted patient to suddenly start shaking her LUE and LLE. The episode lasted for 3 minutes. LKN was 10:15 AM. At baseline she has left sided weakness including left facial droop from her old stroke.   On arrival, EMS noted left sided weakness and called a Code Stroke. They did not see any seizure-like activity. CBG 340, BP 166/91, HR 102, Temp 99.6.   She has had no recent illness or pneuomonia symptoms per EMS. Also no recent decrease in appetite.   Home meds include Eliquis, ASA and Neurontin 100 mg BID.   Stroke risk factors: CAD, breast CA, HLD, HTN, prior stroke.   Seizure risk factors: Large cortically-based old stroke.    Past Medical History:  Diagnosis Date  . CAD (coronary artery disease)   . Carpal tunnel syndrome   . GERD (gastroesophageal reflux disease)   . History of shingles   . Hormone receptor positive breast cancer ([place]) 1960  . Hyperlipemia   . Hypertension   . Hypothyroidism   . Stroke [facility])    left sided weakness    Past Surgical History:  Procedure Laterality Date  . ABDOMINAL HYSTERECTOMY    . APPENDECTOMY    . CAROTID ANGIOGRAPHY Bilateral 03/31/2018   Procedure: CAROTID ANGIOGRAPHY;  Surgeon: Algernon Huxley, MD;  Location: [facility];  Service: Cardiovascular;   Laterality: Bilateral;  . cornoary angioplasty    . ENDARTERECTOMY Left 05/19/2018   Procedure: ENDARTERECTOMY CAROTID;  Surgeon: Algernon Huxley, MD;  Location: [facility] ORS;  Service: Vascular;  Laterality: Left;  . ENDOSCOPIC RETROGRADE CHOLANGIOPANCREATOGRAPHY (ERCP) WITH PROPOFOL N/A 11/10/2017   Procedure: ENDOSCOPIC RETROGRADE CHOLANGIOPANCREATOGRAPHY (ERCP) WITH PROPOFOL;  Surgeon: Lucilla Lame, MD;  Location: [facility] ENDOSCOPY;  Service: Endoscopy;  Laterality: N/A;  . ESOPHAGOGASTRODUODENOSCOPY N/A 03/23/2018   Procedure: ESOPHAGOGASTRODUODENOSCOPY (EGD);  Surgeon: Lin Landsman, MD;  Location: [facility] ENDOSCOPY;  Service: Gastroenterology;  Laterality: N/A;  . ESOPHAGOGASTRODUODENOSCOPY (EGD) WITH PROPOFOL N/A 11/06/2017   Procedure: ESOPHAGOGASTRODUODENOSCOPY (EGD) WITH PROPOFOL;  Surgeon: Lucilla Lame, MD;  Location: [facility] ENDOSCOPY;  Service: Endoscopy;  Laterality: N/A;  .  PATCH ANGIOPLASTY Left 05/19/2018   Procedure: PATCH ANGIOPLASTY;  Surgeon: Algernon Huxley, MD;  Location: [facility] ORS;  Service: Vascular;  Laterality: Left;    Family History  Problem Relation Age of Onset  . Hypertension Son   . Ovarian cancer Mother   . Stroke Father   . Stroke Son               Social History:  reports that she quit smoking about 20 years ago. Her smoking use included cigarettes. She has a 10.00 pack-year smoking history. She has never used smokeless tobacco. She reports previous alcohol use. She reports that she does not use drugs.  Allergies  Allergen Reactions  . Clopidogrel Nausea And Vomiting     reported by [facility] 11/24/13  . Omeprazole Other (See Comments)    Unknown reaction - reported by [facility] 11/24/13    HOME MEDICATIONS:                                                                                                                        ROS:                                                                                                                                        Unable to obtain due to AMS.    There were no vitals taken for this visit.   General Examination:                                                                                                       Physical Exam  HEENT-  [place]/AT  Lungs- Respirations unlabored Extremities- No edema. Chronic pigmentary changes to distal BLE noted.    Neurological Examination Mental Status: Initially postictal, on follow up examination after CT head she is awake and alert. Oriented to self and state but not city, day, year or month. Mildly dysarthric and hypophonic speech. Able to follow all simple  commands. Able to name common objects - thumb and index finger. Cranial Nerves: II: No visual field cut with eyes tested indivitually. However, there is extinction in her left hemifield with DSS.  III,IV, VI: EOM are full horizontally with saccadic visual pursuits noted. No nystagmus.  V,VII: Left facial droop is prominent. Intact to FT bilaterally but with extinction on left to DSS.  VIII: hearing intact to voice.  IX,X: Mild hypophonia XI: Head preferentially rotated to right. SS weak on left.  XII: Does not protrude tongue sufficiently for testing Motor: RUE 4+/5 RLE 4+/5 LUE 0/5 with increased flexor tone and flexion contracture at elbow LLE 2-3/5 with slow movement and increased tone.  Sensory: FT intact x 4 with limbs tested individually; however, there is extinction on the left with DSS.   Deep Tendon Reflexes: 2+ RUE and RLE. 3+ LUE and LLE.  Plantars: Upgoing bilaterally  Cerebellar: No ataxia with FNF on the right. Unable to perform on the left.  Gait: Unable to assess   Lab Results: Basic Metabolic Panel: Recent Labs  Lab 06/25/18 1114  CREATININE 0.70    CBC: No results for input(s): WBC, NEUTROABS, HGB, HCT, MCV, PLT in the last 168 hours.  Cardiac Enzymes: No results for input(s): CKTOTAL, CKMB, CKMBINDEX, TROPONINI in the last 168 hours.  Lipid  Panel: No results for input(s): CHOL, TRIG, HDL, CHOLHDL, VLDL, LDLCALC in the last 168 hours.  Imaging: No results found.  Assessment: 85 year old female with history of large right parietal-frontal ischemic infarction, presenting with new onset seizure.  1. Exam reveals motor findings consistent with her old stroke. No seizure like activity is noted. She was initially postictal and on follow up exam was awake and alert, following commands.  2. History of stroke with concern for atrial fibrillation per prior note in chart. On Eliquis and ASA as outpatient.   Recommendations: 1. Loading with Keppra 2000 mg IV x 1, then start scheduled Keppra 1000 mg IV BID, 12 hours after initial load.  2. Seizure precautions. 3. PT/OT/Speech 4. Na, Mg, Ca and PO4 5. Ativan 2 mg IV PRN recurrent seizure.  6. EEG.  7. Continue home meds including Neurontin    Electronically signed: Dr. Kerney Elbe 06/25/2018, 11:23 AM

## 2018-06-25 NOTE — Progress Notes (Signed)
EEG completed, results pending. 

## 2018-06-25 NOTE — Discharge Instructions (Addendum)
Dysphagia Eating Plan, Pureed This diet is helpful for people with moderate to severe swallowing problems. Pureed foods are smooth and are prepared without lumps so that they can be swallowed safely. Work with your health care provider and your diet and nutrition specialist (dietitian) to make sure that you are following the diet safely and getting all the nutrients you need. What are tips for following this plan? General instructions  You may eat foods that are soft and have a pudding-like texture.  Do not eat foods that you have to chew. If you have to chew the food, then you cannot eat it.  Avoid foods that are hard, dry, sticky, chunky, lumpy, or stringy. Also avoid foods with nuts, seeds, raisins, skins, or pulp.  You may be instructed to thicken liquids. Follow your health care provider's instructions about how to do this and to what consistency. Cooking   If a food is not originally a smooth texture, you may be able to eat the food after: ? Pureeing it. This can be done with a blender. ? Moistening it. This can be done by adding juice, cooking liquid, gravy, or sauce to a dry food and then pureeing it. For example, you may have bread if you soak it in milk and puree it.  If a food is too thin, you may add a commercial thickener, corn starch, rice cereal, or potato flakes to thicken it.  Strain and throw away any liquid that separates from a solid pureed food before eating.  Strain lumps, chunks, pulp, and seeds from pureed foods before eating.  Reheat foods slowly to prevent a tough crust from forming. Meal planning  Eat a variety of foods to get all the nutrients you need.  Add dry milk or protein powder to food to increase calories and protein content.  Follow your meal plan as told by your dietitian. What foods are allowed? The items listed may not be a complete list. Talk with your dietitian about what dietary choices are best for you. Grains Soft breads,  pancakes, [place] toast, muffins, and bread stuffing pureed to a smooth, moist texture, without nuts or seeds. Cooked cereals that have a pudding-like consistency, such as cream of wheat or farina. Pureed oatmeal. Pureed, well-cooked pasta and rice. Vegetables Pureed vegetables. Smooth tomato paste or sauce. Mashed or pureed potatoes without skin. Fruits Pureed fruits such as melons and apples without seeds or pulp. Mashed bananas. Mashed avocado. Fruit juices without pulp or seeds. Meats and other protein foods Pureed meat, poultry, and fish. Smooth pate or liverwurst. Smooth souffles. Pureed beans (such as lentils). Pureed eggs. Smooth nut and seed butters. Pureed tofu. Dairy Yogurt. Milk. Pureed cottage cheese. Nutritional dairy drinks or shakes. Cream cheese. Smooth pudding, ice cream, sherbet, and malts. Fats and oils Butter. Margarine. Vegetable oils. Smooth and strained gravy. Sour cream. Mayonnaise. Smooth sauces such as white sauce, cheese sauce, or hollandaise sauce. Sweets and desserts Moistened and pureed cookies and cakes. Whipped topping. Gelatin. Pudding pops. Seasoning and other foods Finely ground spices. Jelly. Honey. Pureed casseroles. Strained soups. Pureed sandwiches. Beverages Anything prepared at the consistency recommended by your dietitian. What foods are not allowed? The items listed may not be a complete list. Talk with your dietitian about what dietary choices are best for you. Grains Oatmeal. Dry cereals. Hard breads. Breads with seeds or nuts. Whole pasta, rice, or other grains. Whole pancakes, waffles, biscuits, muffins, or rolls. Vegetables Whole vegetables. Stringy vegetables (such as celery). Tomatoes  or tomato sauce with seeds. Fried vegetables. Fruits Whole fresh, frozen, canned, or dried fruits that have not been pureed. Stringy fruits, such as pineapple or coconut. Watermelon with seeds. Dried fruit or fruit leather. Meat and other protein foods Whole or  ground meat, fish, or poultry. Dried or cooked lentils or legumes that have been cooked but not mashed or pureed. Non-pureed eggs. Nuts and seeds. Crunchy peanut butter. Whole tofu or other meat alternatives. Dairy Cheese cubes or slices. Non-pureed cottage cheese. Yogurt with fruit chunks. Fats and oils All fats and sauces that have lumps or chunks. Sweets and desserts Solid desserts. Sticky, chewy sweets (such as licorice and caramel). Candy with nuts or coconut. Seasoning and other foods Coarse or seeded herbs and spices. Chunky preserves. Jams with seeds. Whole sandwiches. Non-pureed casseroles. Chunky soups. Summary  Pureed foods can be helpful for people with moderate to severe swallowing problems.  On the dysphagia eating plan, you may eat foods that are soft and have a pudding-like texture. You should avoid foods that you have to chew. If you have to chew the food, then you cannot eat it.  You may be instructed to thicken liquids. Follow your health care provider's instructions about how to do this and to what consistency. This information is not intended to replace advice given to you by your health care provider. Make sure you discuss any questions you have with your health care provider. Document Released: 02/03/2005 Document Revised: 04/08/2016 Document Reviewed: 04/08/2016 Elsevier Interactive Patient Education  2019 [place] on my medicine - ELIQUIS (apixaban)  This medication education was reviewed with me or my healthcare representative as part of my discharge preparation.  Why was Eliquis prescribed for you? Eliquis was prescribed for you to reduce the risk of a blood clot forming that can cause a stroke if you have a medical condition called atrial fibrillation (a type of irregular heartbeat).  What do You need to know about Eliquis ? Take your Eliquis TWICE DAILY - one tablet in the morning and one tablet in the evening with or without food.  If you have difficulty swallowing the tablet whole please discuss with your pharmacist how to take the medication safely.  Take Eliquis exactly as prescribed by your doctor and DO NOT stop taking Eliquis without talking to the doctor who prescribed the medication.  Stopping may increase your risk of developing a stroke.  Refill your prescription before you run out.  After discharge, you should have regular check-up appointments with your healthcare provider that is prescribing your Eliquis.  In the future your dose may need to be changed if your kidney function or weight changes by a significant amount or as you get older.  What do you do if you miss a dose? If you miss a dose, take it as soon as you remember on the same day and resume taking twice daily.  Do not take more than one dose of ELIQUIS at the same time to make up a missed dose.  Important Safety Information A possible side effect of Eliquis is bleeding. You should call your healthcare provider right away if you experience any of the following: ? Bleeding from an injury or your nose that does not stop. ? Unusual colored urine (red or dark brown) or unusual colored stools (red or black). ? Unusual bruising for unknown reasons. ? A serious fall or if you hit your head (even if there is no bleeding).  Some medicines may  interact with Eliquis and might increase your risk of bleeding or clotting while on Eliquis. To help avoid this, consult your healthcare provider or pharmacist prior to using any new prescription or non-prescription medications, including herbals, vitamins, non-steroidal anti-inflammatory drugs (NSAIDs) and supplements.  This website has more information on Eliquis (apixaban): http://[email or web address].com/eliquis/home

## 2018-06-26 DIAGNOSIS — R569 Unspecified convulsions: Secondary | ICD-10-CM | POA: Diagnosis not present

## 2018-06-26 DIAGNOSIS — R531 Weakness: Secondary | ICD-10-CM | POA: Diagnosis not present

## 2018-06-26 LAB — GLUCOSE, CAPILLARY
Glucose-Capillary: 155 mg/dL — ABNORMAL HIGH (ref 70–99)
Glucose-Capillary: 76 mg/dL (ref 70–99)
Glucose-Capillary: 86 mg/dL (ref 70–99)
Glucose-Capillary: 93 mg/dL (ref 70–99)
Glucose-Capillary: 97 mg/dL (ref 70–99)

## 2018-06-26 LAB — CBC WITH DIFFERENTIAL/PLATELET
Abs Immature Granulocytes: 0.08 10*3/uL — ABNORMAL HIGH (ref 0.00–0.07)
Basophils Absolute: 0.1 10*3/uL (ref 0.0–0.1)
Basophils Relative: 0 %
Eosinophils Absolute: 0.2 10*3/uL (ref 0.0–0.5)
Eosinophils Relative: 1 %
HCT: 36 % (ref 36.0–46.0)
Hemoglobin: 11.3 g/dL — ABNORMAL LOW (ref 12.0–15.0)
Immature Granulocytes: 1 %
Lymphocytes Relative: 16 %
Lymphs Abs: 2.6 10*3/uL (ref 0.7–4.0)
MCH: 26.8 pg (ref 26.0–34.0)
MCHC: 31.4 g/dL (ref 30.0–36.0)
MCV: 85.3 fL (ref 80.0–100.0)
Monocytes Absolute: 1.4 10*3/uL — ABNORMAL HIGH (ref 0.1–1.0)
Monocytes Relative: 9 %
Neutro Abs: 11.8 10*3/uL — ABNORMAL HIGH (ref 1.7–7.7)
Neutrophils Relative %: 73 %
Platelets: 245 10*3/uL (ref 150–400)
RBC: 4.22 MIL/uL (ref 3.87–5.11)
RDW: 15.4 % (ref 11.5–15.5)
WBC: 16.1 10*3/uL — ABNORMAL HIGH (ref 4.0–10.5)
nRBC: 0 % (ref 0.0–0.2)

## 2018-06-26 LAB — COMPREHENSIVE METABOLIC PANEL
ALT: 12 U/L (ref 0–44)
AST: 15 U/L (ref 15–41)
Albumin: 3.1 g/dL — ABNORMAL LOW (ref 3.5–5.0)
Alkaline Phosphatase: 73 U/L (ref 38–126)
Anion gap: 12 (ref 5–15)
BUN: 13 mg/dL (ref 8–23)
CO2: 24 mmol/L (ref 22–32)
Calcium: 9.3 mg/dL (ref 8.9–10.3)
Chloride: 100 mmol/L (ref 98–111)
Creatinine, Ser: 0.67 mg/dL (ref 0.44–1.00)
GFR calc Af Amer: >60 mL/min (ref >60)
GFR calc non Af Amer: >60 mL/min (ref >60)
Glucose, Bld: 84 mg/dL (ref 70–99)
Potassium: 3.6 mmol/L (ref 3.5–5.1)
Sodium: 136 mmol/L (ref 135–145)
Total Bilirubin: 0.7 mg/dL (ref 0.3–1.2)
Total Protein: 6.1 g/dL — ABNORMAL LOW (ref 6.5–8.1)

## 2018-06-26 MED ORDER — ENSURE ENLIVE PO LIQD
237.0000 mL | Freq: Two times a day (BID) | ORAL | Status: DC
  Administered 2018-06-26 – 2018-06-27 (×2): 237 mL via ORAL

## 2018-06-26 MED ORDER — ASPIRIN 81 MG PO CHEW
81.0000 mg | CHEWABLE_TABLET | Freq: Every day | ORAL | Status: DC
  Administered 2018-06-26 – 2018-06-27 (×2): 81 mg via ORAL
  Filled 2018-06-26 (×2): qty 1

## 2018-06-26 MED ORDER — APIXABAN 2.5 MG PO TABS
2.5000 mg | ORAL_TABLET | Freq: Two times a day (BID) | ORAL | Status: DC
  Administered 2018-06-26 – 2018-06-27 (×3): 2.5 mg via ORAL
  Filled 2018-06-26 (×3): qty 1

## 2018-06-26 MED ORDER — ORAL CARE MOUTH RINSE
15.0000 mL | Freq: Two times a day (BID) | OROMUCOSAL | Status: DC
  Administered 2018-06-26 (×2): 15 mL via OROMUCOSAL

## 2018-06-26 MED ORDER — POLYETHYLENE GLYCOL 3350 17 G PO PACK: 17.0000 g | PACK | Freq: Every day | ORAL | Status: DC | PRN

## 2018-06-26 MED ORDER — METOPROLOL TARTRATE 5 MG/5ML IV SOLN
5.0000 mg | Freq: Three times a day (TID) | INTRAVENOUS | Status: AC
  Administered 2018-06-26: 5 mg via INTRAVENOUS
  Filled 2018-06-26: qty 5

## 2018-06-26 MED ORDER — CHLORHEXIDINE GLUCONATE 0.12 % MT SOLN
15.0000 mL | Freq: Two times a day (BID) | OROMUCOSAL | Status: DC
  Administered 2018-06-26 – 2018-06-27 (×3): 15 mL via OROMUCOSAL
  Filled 2018-06-26 (×3): qty 15

## 2018-06-26 MED ORDER — METOPROLOL TARTRATE 25 MG PO TABS
25.0000 mg | ORAL_TABLET | Freq: Two times a day (BID) | ORAL | Status: DC
  Administered 2018-06-26 – 2018-06-27 (×2): 25 mg via ORAL
  Filled 2018-06-26 (×2): qty 1

## 2018-06-26 MED ORDER — SIMVASTATIN 20 MG PO TABS
40.0000 mg | ORAL_TABLET | Freq: Every day | ORAL | Status: DC
  Administered 2018-06-26: 17:00:00 40 mg via ORAL
  Filled 2018-06-26: qty 2

## 2018-06-26 MED ORDER — MIDODRINE HCL 5 MG PO TABS
2.5000 mg | ORAL_TABLET | Freq: Two times a day (BID) | ORAL | Status: DC
  Administered 2018-06-26 – 2018-06-27 (×2): 2.5 mg via ORAL
  Filled 2018-06-26 (×2): qty 1

## 2018-06-26 MED ORDER — METOPROLOL TARTRATE 5 MG/5ML IV SOLN: 5.0000 mg | Freq: Three times a day (TID) | INTRAVENOUS | Status: DC

## 2018-06-26 MED ORDER — VENLAFAXINE HCL ER 75 MG PO CP24
150.0000 mg | ORAL_CAPSULE | Freq: Every day | ORAL | Status: DC
  Administered 2018-06-26: 150 mg via ORAL
  Filled 2018-06-26: qty 2

## 2018-06-26 MED ORDER — LEVOTHYROXINE SODIUM 25 MCG PO TABS
25.0000 ug | ORAL_TABLET | Freq: Every day | ORAL | Status: DC
  Administered 2018-06-27: 25 ug via ORAL
  Filled 2018-06-26: qty 1

## 2018-06-26 MED ORDER — GABAPENTIN 100 MG PO CAPS
100.0000 mg | ORAL_CAPSULE | Freq: Two times a day (BID) | ORAL | Status: DC
  Administered 2018-06-26 – 2018-06-27 (×3): 100 mg via ORAL
  Filled 2018-06-26 (×3): qty 1

## 2018-06-26 NOTE — Progress Notes (Signed)
Initial Nutrition Assessment   RD working remotely.  DOCUMENTATION CODES:   Not applicable  INTERVENTION:  Provide Ensure Enlive po BID, each supplement provides 350 kcal and 20 grams of protein  Encourage adequate PO intake.   Recommend obtaining new weight to fully assess weight trends.  NUTRITION DIAGNOSIS:   Increased nutrient needs related to acute illness as evidenced by estimated needs.  GOAL:   Patient will meet greater than or equal to 90% of their needs  MONITOR:   PO intake, Supplement acceptance, Labs, Weight trends, I & O's, Skin  REASON FOR ASSESSMENT:   Consult Assessment of nutrition requirement/status  ASSESSMENT:    85 y.o. 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 fib on Eliquis, vasovagal symptoms could be, ICA stenosis, carotid endarterectomy, hypothyroidism, hypertension, depression, HLD, dysphagia. Presents with seizures.  RD unable to obtain nutrition history from attempt at contacting pt via inpatient room phone. Pt is currently on a dysphagia 1 diet with thin liquids. Meal completion 100%. Noted no recent weight recorded. Recommend obtaining new weight to fully assess weight trends. RD to order nutritional supplements to aid in caloric and protein needs.   Unable to complete Nutrition-Focused physical exam at this time.   Labs and medications reviewed.   Diet Order:   Diet Order            DIET - DYS 1 Room service appropriate? Yes; Fluid consistency: Thin  Diet effective now              EDUCATION NEEDS:   Not appropriate for education at this time  Skin:  Skin Assessment: Reviewed RN Assessment  Last BM:  Unknown  Height:   Ht Readings from Last 1 Encounters:  05/19/18 5' (1.524 m)    Weight:   Wt Readings from Last 1 Encounters:  05/19/18 42.2 kg    Ideal Body Weight:  45.45 kg  BMI:  There is no height or weight on file to calculate  BMI.  Estimated Nutritional Needs:   Kcal:  1400-1600  Protein:  65-75 grams  Fluid:  >/= 1.5 L/day    Corrin Parker, MS, RD, LDN Pager # [phone] After hours/ weekend pager # [phone]

## 2018-06-26 NOTE — Evaluation (Signed)
Clinical/Bedside Swallow Evaluation Patient Details  Name: Sonya [name] MRN: [id number] Date of Birth: 1932-05-04  Today's Date: 06/26/2018 Time: SLP Start Time (ACUTE ONLY): 9563 SLP Stop Time (ACUTE ONLY): 0903 SLP Time Calculation (min) (ACUTE ONLY): 11 min  Past Medical History:  Past Medical History:  Diagnosis Date  . CAD (coronary artery disease)   . Carpal tunnel syndrome   . GERD (gastroesophageal reflux disease)   . History of shingles   . Hormone receptor positive breast cancer ([place]) 1960  . Hyperlipemia   . Hypertension   . Hypothyroidism   . Stroke [facility])    left sided weakness   Past Surgical History:  Past Surgical History:  Procedure Laterality Date  . ABDOMINAL HYSTERECTOMY    . APPENDECTOMY    . CAROTID ANGIOGRAPHY Bilateral 03/31/2018   Procedure: CAROTID ANGIOGRAPHY;  Surgeon: Algernon Huxley, MD;  Location: [place] [facility];  Service: Cardiovascular;  Laterality: Bilateral;  . cornoary angioplasty    . ENDARTERECTOMY Left 05/19/2018   Procedure: ENDARTERECTOMY CAROTID;  Surgeon: Algernon Huxley, MD;  Location: [facility];  Service: Vascular;  Laterality: Left;  . ENDOSCOPIC RETROGRADE CHOLANGIOPANCREATOGRAPHY (ERCP) WITH PROPOFOL N/A 11/10/2017   Procedure: ENDOSCOPIC RETROGRADE CHOLANGIOPANCREATOGRAPHY (ERCP) WITH PROPOFOL;  Surgeon: Lucilla Lame, MD;  Location: [facility] END[facility];  Service: Endoscopy;  Laterality: N/A;  . ESOPHAGOGASTRODUODENOSCOPY N/A 03/23/2018   Procedure: ESOPHAGOGASTRODUODENOSCOPY (EGD);  Surgeon: Lin Landsman, MD;  Location: [facility] ENDOSCOPY;  Service: Gastroenterology;  Laterality: N/A;  . ESOPHAGOGASTRODUODENOSCOPY (EGD) WITH PROPOFOL N/A 11/06/2017   Procedure: ESOPHAGOGASTRODUODENOSCOPY (EGD) WITH PROPOFOL;  Surgeon: Lucilla Lame, MD;  Location: [facility] ENDOSCOPY;  Service: Endoscopy;  Laterality: N/A;  . PATCH ANGIOPLASTY Left 05/19/2018   Procedure: PATCH ANGIOPLASTY;  Surgeon: Algernon Huxley, MD;  Location: [facility] OR[facility];  Service: Vascular;   Laterality: Left;   HPI:  Sonya [name] is a 85 y.o. female with past medical history significant for right parietal frontal stroke, residual left-sided hemiparesis, esophageal stricture , A. fib, vasovagal symptoms could be, ICA stenosis, carotid endarterectomy, hypothyroidism, hypertension, depression, HLD, dysphagia . Pt admitted with lower extremity shaking during PT session. CT head was unrevealing for any acute changes.  BSE 10/2017 no s/s aspiration but increased risk due to oral dysphagia. Puree and nectar thick recommended. No recent CXR however all CXR's since late 2019 were neg for change.    Assessment / Plan / Recommendation Clinical Impression  Thin water and applesauce consumed during limited assessment. Pt moaning denying pain stating she "just doen't feel good" and prompting/encouragement to take anything from SLP. No s/s aspiration with 3-4 sips water or 2 bites applesauce. Voice is clear, no congestion and previous chest x-rays have been negative for acute change past 6 months. Orally she controlled bolus and appeared to initiate swallow without notable delays. Only instrumental assessment can provide information into pharyngeal dysphagia and aspiration however current clinical presentation does not warrant this. Recommend Dys 1 (puree). thin liquids, crush meds and full supervision and assist with continued ST intervention.  SLP Visit Diagnosis: Dysphagia, unspecified (R13.10)    Aspiration Risk  Moderate aspiration risk;Mild aspiration risk    Diet Recommendation Dysphagia 1 (Puree);Thin liquid   Liquid Administration via: Cup;Straw Medication Administration: Crushed with puree Supervision: Patient able to self feed;Full supervision/cueing for compensatory strategies Compensations: Minimize environmental distractions;Slow rate;Small sips/bites Postural Changes: Seated upright at 90 degrees    Other  Recommendations Oral Care Recommendations: Oral care BID   Follow up  Recommendations Skilled Nursing facility  Frequency and Duration min 2x/week  2 weeks       Prognosis Prognosis for Safe Diet Advancement: Fair Barriers to Reach Goals: Cognitive deficits      Swallow Study   General HPI: Sonya [name] is a 85 y.o. female with past medical history significant for right parietal frontal stroke, residual left-sided hemiparesis, esophageal stricture , A. fib, vasovagal symptoms could be, ICA stenosis, carotid endarterectomy, hypothyroidism, hypertension, depression, HLD, dysphagia . Pt admitted with lower extremity shaking during PT session. CT head was unrevealing for any acute changes.  BSE 10/2017 no s/s aspiration but increased risk due to oral dysphagia. Puree and nectar thick recommended. No recent CXR however all CXR's since late 2019 were neg for change.  Type of Study: Bedside Swallow Evaluation Previous Swallow Assessment: (see HPI) Diet Prior to this Study: NPO Temperature Spikes Noted: No Respiratory Status: Room air History of Recent Intubation: No Behavior/Cognition: Alert;Distractible;Requires cueing Oral Cavity Assessment: Other (comment)(unable to view completely) Oral Care Completed by SLP: No Oral Cavity - Dentition: Dentures, top;Missing dentition;Poor condition Vision: Functional for self-feeding Patient Positioning: Upright in bed Baseline Vocal Quality: Normal Volitional Cough: Weak Volitional Swallow: Unable to elicit    Oral/Motor/Sensory Function Overall Oral Motor/Sensory Function: Other (comment)(no focal weakness)   Ice Chips Ice chips: Not tested   Thin Liquid Thin Liquid: Within functional limits    Nectar Thick Nectar Thick Liquid: Not tested   Honey Thick Honey Thick Liquid: Not tested   Puree Puree: Within functional limits   Solid     Solid: Not tested      Houston Siren 06/26/2018,9:14 AM  Orbie Pyo Colvin Caroli.Ed Risk analyst [phone] Office  [phone]

## 2018-06-26 NOTE — Evaluation (Signed)
Occupational Therapy Evaluation Patient Details Name: Sonya [name] MRN: [id number] DOB: 05-08-1932 Today's Date: 06/26/2018    History of Present Illness Pt is an 85 y/o female admitted secondary to likely seizure activity. EEG pending. CT negative for acute abnormality. PMH includes CVA with L hemiparesis, a fib, HTN, CAD, carotid endarterectomy.    Clinical Impression   Pt PTA: fromt chart living with family requires assist with ADL and limited mobility. Pt currently performing bed level tasks as pt was resisting movement OOB with pain in L side. Pt's LUE rigid s/p previous CVA. Pt set-upA for feeding and mod to totalA for all other ADL. Pt able to follow most commands, but remains confused asking about upstairs. Pt would benefit from continued OT skilled services for ADL, bed mobility and LUE HEP. OT to follow acutely.    Follow Up Recommendations  SNF;Home health OT;Supervision/Assistance - 24 hour(with 24/7 pt could return home)    Equipment Recommendations  None recommended by OT    Recommendations for Other Services       Precautions / Restrictions Precautions Precautions: Fall;Other (comment) Precaution Comments: seizures Restrictions Weight Bearing Restrictions: No      Mobility Bed Mobility Overal bed mobility: Needs Assistance Bed Mobility: Supine to Sit;Sit to Supine     Supine to sit: Total assist Sit to supine: Total assist   General bed mobility comments: Total A for assist with bed mobility. Pt with posterior lean in sitting and unable to maintain sitting balance without total A.   Transfers Overall transfer level: Needs assistance               General transfer comment: unable to test    Balance Overall balance assessment: Needs assistance     Sitting balance - Comments: unable to attempt                                   ADL either performed or assessed with clinical judgement   ADL Overall ADL's : Needs  assistance/impaired Eating/Feeding: Set up;Bed level   Grooming: Minimal assistance;Bed level   Upper Body Bathing: Minimal assistance;Bed level   Lower Body Bathing: Maximal assistance;Total assistance;Bed level   Upper Body Dressing : Minimal assistance;Bed level   Lower Body Dressing: Maximal assistance;Total assistance;Bed level   Toilet Transfer: Total assistance   Toileting- Clothing Manipulation and Hygiene: Total assistance       Functional mobility during ADLs: (dnt) General ADL Comments: Pt requiring assist due to poor balance, activity tolerance and strength     Vision Baseline Vision/History: No visual deficits Vision Assessment?: No apparent visual deficits     Perception     Praxis      Pertinent Vitals/Pain Pain Assessment: Faces Faces Pain Scale: Hurts even more Pain Location: LUE/LLE with movement Pain Descriptors / Indicators: Grimacing;Guarding;Moaning     Hand Dominance     Extremity/Trunk Assessment Upper Extremity Assessment Upper Extremity Assessment: Generalized weakness LUE Deficits / Details: LUE pain when attempting to move LUE. WEakness at baseline secondary to previous CVA.    Lower Extremity Assessment LLE Deficits / Details: Pt with hamstring contracture and unable to fully straighten LLE. Pt with weakness at baseline secondary to CVA.    Cervical / Trunk Assessment Cervical / Trunk Assessment: Kyphotic   Communication Communication Communication: No difficulties   Cognition Arousal/Alertness: Lethargic Behavior During Therapy: Flat affect Overall Cognitive Status: No family/caregiver present to determine baseline cognitive functioning  General Comments: Pt oriented to person and place. When asked why she was here, she said she was feeling really bad. When asked what year it was pt stated 1953. Pt responding to questions, however, kept eyes closed most of session.    General  Comments  Bed level activity as pt yelling ouch with any movement.    Exercises     Shoulder Instructions      Home Living Family/patient expects to be discharged to:: Private residence Living Arrangements: Children Available Help at Discharge: Family;Available 24 hours/day Type of Home: House Home Access: Ramped entrance     Home Layout: One level     Bathroom Shower/Tub: Teacher, early years/pre: Handicapped height     Home Equipment: Wheelchair - manual;Bedside commode;Cane - single point;Hospital bed          Prior Functioning/Environment Level of Independence: Needs assistance  Gait / Transfers Assistance Needed: Per pt's Son, he assist with bed mobility and transfers, pt non-ambulatory ADL's / Homemaking Assistance Needed: Per previous notes, pt required assist for all ADL tasks.             OT Problem List: Decreased strength;Decreased activity tolerance;Impaired balance (sitting and/or standing);Decreased safety awareness;Pain      OT Treatment/Interventions: Self-care/ADL training;Therapeutic exercise;Neuromuscular education;Energy conservation;Therapeutic activities;Patient/family education;Balance training    OT Goals(Current goals can be found in the care plan section) Acute Rehab OT Goals Patient Stated Goal: none stated OT Goal Formulation: With patient Time For Goal Achievement: 07/10/18 Potential to Achieve Goals: Good ADL Goals Pt Will Perform Grooming: with modified independence;sitting Pt Will Perform Lower Body Dressing: with mod assist;bed level Additional ADL Goal #1: Pt will tolerate sitting EOB x10 mins with minA for static balance  OT Frequency: Min 2X/week   Barriers to D/C:            Co-evaluation              AM-PAC OT "6 Clicks" Daily Activity     Outcome Measure Help from another person eating meals?: A Little Help from another person taking care of personal grooming?: A Lot Help from another person  toileting, which includes using toliet, bedpan, or urinal?: Total Help from another person bathing (including washing, rinsing, drying)?: Total Help from another person to put on and taking off regular upper body clothing?: A Lot Help from another person to put on and taking off regular lower body clothing?: Total 6 Click Score: 10   End of Session Nurse Communication: Mobility status  Activity Tolerance: Treatment limited secondary to agitation;Treatment limited secondary to medical complications (Comment);Patient limited by pain Patient left: in bed;with call bell/phone within reach;with bed alarm set  OT Visit Diagnosis: Unsteadiness on feet (R26.81);Muscle weakness (generalized) (M62.81);Adult, failure to thrive (R62.7);Pain Pain - Right/Left: Left Pain - part of body: Leg;Arm                Time: 5188-4166 OT Time Calculation (min): 13 min Charges:  OT General Charges $OT Visit: 1 Visit OT Evaluation $OT Eval Low Complexity: 1 Low  Darryl Nestle) Marsa Aris OTR/L Acute Rehabilitation Services Pager: [phone] Office: [place] 06/26/2018, 5:09 PM

## 2018-06-26 NOTE — Progress Notes (Addendum)
NEURO HOSPITALIST PROGRESS NOTE   Subjective: Patient in  Bed asleep, NAD.  No seizure like activity noted. Per nursing no further seizures.   Exam: Vitals:   06/26/18 0500 06/26/18 0750  BP: (!) 183/74 (!) 193/82  Pulse:  80  Resp:  16  Temp:  97.9 F (36.6 C)  SpO2:  100%    Physical Exam  HEENT-  Normocephalic, no lesions, without obvious abnormality.  Normal external eye and conjunctiva.   Cardiovascular- S1-S2 audible, pulses palpable throughout   Lungs- no excessive working breathing.  Saturations within normal limits on RA Extremities- Warm, dry and intact Skin-warm and dry, multiple pressure relief dressings present.   Neuro:  Mental Status: Alert and oriented except did not know month. Thought content appropriate.  Speech fluent without evidence of aphasia.  Able to follow all commands without difficulty. Cranial Nerves: II:  Visual fields grossly normal,  III,IV, VI: ptosis not present, extra-ocular motions intact bilaterally pupils equal, round, reactive to light  V,VII: smile symmetric, facial droop left,  facial light touch sensation normal bilaterally VIII: hearing normal bilaterally IX,X: uvula rises symmetrically XI: bilateral shoulder shrug XII: midline tongue extension Motor: Right : Upper extremity   5/5    Left:     Upper extremity   0/5  Lower extremity   5/5     Lower extremity   4/5 Tone and bulk:normal tone throughout; no atrophy noted Sensory:  Light touch intact on the right. Decreased sensation to LUE Plantars: Right: downgoing   Left: downgoing Cerebellar: Right arm no ataxia with FNF; unable to perform on left side.  Gait: Deferred    Medications:  Scheduled: . levothyroxine  12.5 mcg Intravenous Daily  . metoprolol tartrate  5 mg Intravenous Q8H  . sodium chloride flush  10-40 mL Intracatheter Q12H   Continuous: . sodium chloride 75 mL/hr at 06/25/18 2250  . levETIRAcetam 1,000 mg (06/26/18 0155)    EHU:[id number] sodium, LORazepam, sodium chloride flush  Pertinent Labs/Diagnostics:   Ct Head Code Stroke Wo Contrast  Result Date: 06/25/2018 CLINICAL DATA:  Code stroke.  Unresponsive with facial droop. EXAM: CT HEAD WITHOUT CONTRAST TECHNIQUE: Contiguous axial images were obtained from the base of the skull through the vertex without intravenous contrast. COMPARISON:  04/10/2018 FINDINGS: Brain: No evidence of acute infarction, hemorrhage, hydrocephalus, extra-axial collection or mass lesion/mass effect. Large remote right MCA territory infarct in the superior division primarily affecting the parietal and posterior frontal lobes. Age congruent cerebral volume loss. Vascular: Atherosclerotic calcification.  No hyperdense vessel. Skull: Negative Sinuses/Orbits: Bilateral cataract resection Other: These results were communicated to Dr. Cheral Marker at 11:27 amon 5/8/2020by text page via the [facility] messaging system. ASPECTS [facility] Stroke Program Early CT Score) Not scored with this nonspecific history. IMPRESSION: 1. No acute finding. 2. Large remote right MCA branch infarct. Electronically Signed   By: Monte Fantasia M.D.   On: 06/25/2018 11:28   Assessment: 85 year old female with history of large right parietal-frontal ischemic infarction, presenting with new onset seizure.  1. Exam reveals motor findings consistent with her old stroke. No seizure like activity is noted. She was initially postictal and on follow up exam was awake and alert, following commands.  2. History of stroke with concern for atrial fibrillation per prior note in chart. On Eliquis and ASA as outpatient.  3. EEG: This is an abnormal electroencephalogram secondary  to right hemispheric slowing and decreased amplitude, consistent with the patient's history of a right hemispheric infarct.  No epileptiform activity is noted.   4. Na, Mg, PO4, Ca are all WNL   Recommendations: 1. Continue Keppra 1000 mg IV BID during her stay as  well as at discharge 2. Seizure precautions. 3. PT/OT/Speech 4. Ativan 2 mg IV PRN recurrent seizure.  5. Continue home meds including Neurontin 6. Outpatient Neurology follow up 7. Neurohospitalist team will sign off. Please call if there are additional questions.    Laurey Morale, MSN, NP-C [facility] [phone]  Electronically signed: Dr. Kerney Elbe 06/26/2018, 10:00 AM

## 2018-06-26 NOTE — Care Management Obs Status (Signed)
[place] NOTIFICATION   Patient Details  Name: Sonya [name] MRN: [id number] Date of Birth: 29-Mar-1932   Medicare Observation Status Notification Given:  Yes    Oretha Milch, LCSW 06/26/2018, 2:07 PM

## 2018-06-26 NOTE — Discharge Summary (Signed)
[facility] Discharge Summary  Patient name: Sonya [name] Medical record number: [id number] Date of birth: 1932-02-21 Age: 85 y.o. Gender: female Date of Admission: 06/25/2018  Date of Discharge: 06/27/18  Admitting Physician: Kinnie Feil, MD  Primary Care Provider: Baxter Hire, MD Consultants: Neurology, PT/OT, SLP  Indication for Hospitalization: Seizure-like activity  Discharge Diagnoses/Problem List:  Recent right parietal CVA Dysphagia Chronic anemia Hypothyroidism Hypertension Hyperlipidemia Depression Vitamin D deficiency Hypomagnesemia GERD Constipation  Disposition: Home   Discharge Condition: Stable, improved  Discharge Exam:  Gen: frail elderly lady laying in bed in NAD Heart: RRR no MRG Lungs: CTA bilaterally, no increased work of breathing Abdomen: soft, non-tender, non-distended, +BS Extremities: thin extremities, no edema Neuro: no focal deficits Psych: oriented to person   Brief Hospital Course:  [name] was admitted on 5/8 for seizure-like activity at home.  CT head was unremarkable.  Neurology was consulted, and they ascribed her neurologic event as a likely seizure.  An EEG was performed which showed slowing in the area that was affected by her stroke but did not show epileptiform activity.  She was given a load of Keppra and continued on Keppra 1000 mg twice daily.  Her medications were initially held since she failed two swallow screens, but she was placed on a dysphagia 1 diet after assessment by speech therapy on 5/9, and her medications, including Synthroid, metoprolol, Midrin, venlafaxine, aspirin, Eliquis, and gabapentin, were restarted.  She had frequent neuro checks and was also assessed by PT, who recommended home health PT and 24-hour surveillance at home.  OT recommended the same. Her son is her primary caretaker and has home health set up at home, he is confident he can supply 24 hour care for his  mother.  The patient was discharged home on 5/10 after she exhibited no further seizure activity during this hospitalization.  She will continue Keppra 1000 mg twice daily at home.  Issues for Follow Up:  1. Patient should follow-up with neurology to determine the length of therapy of Keppra 2. Continue to optimize patient's medical therapy for her previous CVA 3. Consider Holter monitor for a past medical history that noted possible paroxysmal atrial fibrillation 4. Pureed diet (dysphagia 1) with thin liquids, can crush meds  Significant Procedures: EEG  Significant Labs and Imaging:  Recent Labs  Lab 06/25/18 1112 06/26/18 0433 06/27/18 0806  WBC 12.9* 16.1* 10.9*  HGB 11.9* 11.3* 10.4*  HCT 39.9 36.0 33.0*  PLT 267 245 235   Recent Labs  Lab 06/25/18 1112 06/25/18 1114 06/26/18 0433  NA 139  --  136  K 4.1  --  3.6  CL 103  --  100  CO2 22  --  24  GLUCOSE 128*  --  84  BUN 18  --  13  CREATININE 0.76 0.70 0.67  CALCIUM 9.4  --  9.3  MG 1.7  --   --   PHOS 3.7  --   --   ALKPHOS 71  --  73  AST 17  --  15  ALT 14  --  12  ALBUMIN 3.1*  --  3.1*    Ct Head Code Stroke Wo Contrast  Result Date: 06/25/2018 CLINICAL DATA:  Code stroke.  Unresponsive with facial droop. EXAM: CT HEAD WITHOUT CONTRAST TECHNIQUE: Contiguous axial images were obtained from the base of the skull through the vertex without intravenous contrast. COMPARISON:  04/10/2018 FINDINGS: Brain: No evidence of acute infarction, hemorrhage, hydrocephalus,  extra-axial collection or mass lesion/mass effect. Large remote right MCA territory infarct in the superior division primarily affecting the parietal and posterior frontal lobes. Age congruent cerebral volume loss. Vascular: Atherosclerotic calcification.  No hyperdense vessel. Skull: Negative Sinuses/Orbits: Bilateral cataract resection Other: These results were communicated to Dr. Cheral Marker at 11:27 amon 5/8/2020by text page via the [facility] messaging system.  ASPECTS [facility] Stroke Program Early CT Score) Not scored with this nonspecific history. IMPRESSION: 1. No acute finding. 2. Large remote right MCA branch infarct. Electronically Signed   By: Monte Fantasia M.D.   On: 06/25/2018 11:28    Results/Tests Pending at Time of Discharge: none  Discharge Medications:  Allergies as of 06/27/2018      Reactions   Clopidogrel Nausea And Vomiting    reported by [place] 11/24/13   Omeprazole Other (See Comments)   Unknown reaction - reported by [place] 11/24/13      Medication List    TAKE these medications   acetaminophen 325 MG tablet Commonly known as:  TYLENOL Take 1 tablet (325 mg total) by mouth every 4 (four) hours as needed for mild pain (or temp >/= 101 F).   acetaminophen 325 MG suppository Commonly known as:  TYLENOL Place 1 suppository (325 mg total) rectally every 4 (four) hours as needed for mild pain (or temp >/= 101 F).   apixaban 2.5 MG Tabs tablet Commonly known as:  ELIQUIS Take 1 tablet (2.5 mg total) by mouth 2 (two) times daily.   aspirin 81 MG EC tablet Take 1 tablet (81 mg total) by mouth daily.   B-complex with vitamin C tablet Take 1 tablet by mouth daily.   bisacodyl 5 MG EC tablet Commonly known as:  Ducodyl Take 2 tablets (10 mg total) by mouth daily. What changed:    how much to take  when to take this   docusate sodium 100 MG capsule Commonly known as:  COLACE Take 100 mg by mouth 2 (two) times daily.   feeding supplement (ENSURE ENLIVE) Liqd Take 237 mLs by mouth 2 (two) times daily between meals.   ferrous sulfate 325 (65 FE) MG tablet Take 1 tablet (325 mg total) by mouth 2 (two) times daily with a meal.   gabapentin 100 MG capsule Commonly known as:  NEURONTIN Take 100 mg by mouth 2 (two) times daily.   hydrochlorothiazide 12.5 MG capsule Commonly known as:  MICROZIDE Take 12.5 mg by mouth daily.   lansoprazole 30 MG capsule Commonly known  as:  Prevacid Take 1 capsule (30 mg total) by mouth daily.   levETIRAcetam 1000 MG tablet Commonly known as:  KEPPRA Take 1 tablet (1,000 mg total) by mouth 2 (two) times daily.   levothyroxine 25 MCG tablet Commonly known as:  SYNTHROID Take 25 mcg by mouth daily before breakfast.   Melatonin 5 MG Caps Take 5 mg by mouth at bedtime.   metoprolol tartrate 25 MG tablet Commonly known as:  LOPRESSOR Take 1 tablet (25 mg total) by mouth 2 (two) times daily.   midodrine 2.5 MG tablet Commonly known as:  PROAMATINE Take 1 tablet (2.5 mg total) by mouth 2 (two) times daily with a meal.   omeprazole 20 MG capsule Commonly known as:  PRILOSEC Take 20 mg by mouth 2 (two) times daily before a meal.   ondansetron 4 MG disintegrating tablet Commonly known as:  ZOFRAN-ODT Take 1 tablet (4 mg total) by mouth every 8 (eight) hours as needed  for nausea or vomiting.   simvastatin 40 MG tablet Commonly known as:  ZOCOR Take 1 tablet (40 mg total) by mouth daily at 6 PM.   venlafaxine XR 150 MG 24 hr capsule Commonly known as:  EFFEXOR-XR Take 150 mg by mouth at bedtime.   Vitamin D3 25 MCG (1000 UT) Caps Take 1,000 Units by mouth 2 (two) times a day.       Discharge Instructions: Please refer to Patient Instructions section of EMR for full details.  Patient was counseled important signs and symptoms that should prompt return to medical care, changes in medications, dietary instructions, activity restrictions, and follow up appointments.   Follow-Up Appointments: Follow-up Information    Baxter Hire, MD. Schedule an appointment as soon as possible for a visit in 1 week(s).   Specialty:  Internal Medicine Contact information: [place] [address] [place], [place], DO 06/27/2018, 8:34 AM PGY-3, [place]

## 2018-06-26 NOTE — Progress Notes (Addendum)
Family Medicine Teaching Service Daily Progress Note Intern Pager: [phone]  Patient name: Sonya [name] Medical record number: [id number] Date of birth: May 07, 1932 Age: 85 y.o. Gender: female  Primary Care Provider: Baxter Hire, MD Consultants: Neurology Code Status: FULL  Pt Overview and Major Events to Date:  Admitted 5/8  Assessment and Plan:  Sonya [name] is a 85 y.o. 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 fib on Eliquis, vasovagal symptoms could be, ICA stenosis, carotid endarterectomy, hypothyroidism, hypertension, depression, HLD, dysphagia    # Acute Neurologic Event  Patient undergoing seizure workup after presenting with a shaking episode after having a recent stroke.  EEG performed on 5/8 showed slowing of signal in the area of her previous stroke but was negative for epileptiform activity.  Patient was loaded with Keppra 2,000 mg on 5/8 and continued on Keppra 1,000 mg BID.  She has had no further seizure-like episodes since 5/8.  Frequent neuro checks  Follow-up neurology recommendations  Follow-up OT/PT recommendations  Dysphagia 1 diet per SLP with crushed medications  # Dysphagia Patient with history of dysphasia in her chart and failed swallow study on admission.  Patient has several oral medications that will be unable to take if she is unable to pass a second swallow screen.    Continue dysphagia 1 diet  Called son to discuss whether this dysphagia is close to patient's baseline - no answer, VM not set up  # Hx R Parietal Frontal CVA Deficits note on PE as above. Bed bound.   Restart home gabapentin 100mg  BID, ASA and eliquis  Continue PT/OT  #Leukocytosis WBC 12.9 upon arrival, increased to 16.1 on 5/9.    Patient continues to be afebrile and has high blood pressures.  Denies urinary symptoms and has no increased oxygen requirement, so infectious source is hard  to identify.  Obtaining CXR if respiratory status changes or UA if has urinary symptoms  Daily CBC  #Question of possible paroxysmal Afib  In sinus rhythm on admission.  Patient thought to have had stroke secondary to atrial fibrillation however this was never proven.  Per chart review, patient was supposed to get a 30-day Holter monitor, but never received per notes in October 2019.  Telemetry  Continue Eliquis   # Anemia, chronic  Hemoglobin 11.9 and stable on admission  Continue 325 mg iron on discharge   # Hypothyroidism  Continue home on Synthroid 25 mcg  # HTN Hypertensive with BP 190/82 on 5/9.  Continue home Lopressor 25mg  BID, midodrine 2.5mg  BID   Pharmacy recommending that patient be placed on IV metoprolol 2.5 mg while NPO; increase metoprolol to 5 mg due to hypertension.  # HLD   Continue home simvastatin 40 mg   # Depression  Continue home venlafaxine 150mg  XR   # Vitamin D deficiency  Continue home Vitamin D on d/c  # Hypomagnesemia Mg WNL on admission.   Magnesium oxide 400mg  daily on d/c  # GERD   IV protonix  # Constipation    MiraLAX  #FEN/GI:   Fluids: IVF @75mL /hr  Electrolytes: wnl  Nutrition:  Dysphagia 1 diet   Disposition: continue on telemetry  Subjective:  Patient says that she has no pain or other complaints this morning.  She does report pain in her left arm when trying to move her to her left side to auscultate her lungs.  She has no urinary symptoms or shortness of breath.  She does not  want to go to SNF on d/c.  Objective: Temp:  [97.5 F (36.4 C)-99.6 F (37.6 C)] 97.9 F (36.6 C) (05/09 0750) Pulse Rate:  [71-97] 80 (05/09 0750) Resp:  [15-20] 16 (05/09 0750) BP: (155-193)/(50-127) 193/82 (05/09 0750) SpO2:  [94 %-100 %] 100 % (05/09 0750) Physical Exam: General: Frail lady lying comfortably in bed with mittens on right hand Cardiovascular: Regular rate rhythm, no murmurs rubs or  gallops Respiratory: Clear to auscultation bilaterally, no increased work of breathing Abdomen: Soft, nontender, nondistended Extremities: 3/5 strength in left upper and left lower extremities  Laboratory: Recent Labs  Lab 06/25/18 1112 06/26/18 0433  WBC 12.9* 16.1*  HGB 11.9* 11.3*  HCT 39.9 36.0  PLT 267 245   Recent Labs  Lab 06/25/18 1112 06/25/18 1114 06/26/18 0433  NA 139  --  136  K 4.1  --  3.6  CL 103  --  100  CO2 22  --  24  BUN 18  --  13  CREATININE 0.76 0.70 0.67  CALCIUM 9.4  --  9.3  PROT 6.1*  --  6.1*  BILITOT 0.5  --  0.7  ALKPHOS 71  --  73  ALT 14  --  12  AST 17  --  15  GLUCOSE 128*  --  84      Imaging/Diagnostic Tests: Ct Head Code Stroke Wo Contrast  Result Date: 06/25/2018 CLINICAL DATA:  Code stroke.  Unresponsive with facial droop. EXAM: CT HEAD WITHOUT CONTRAST TECHNIQUE: Contiguous axial images were obtained from the base of the skull through the vertex without intravenous contrast. COMPARISON:  04/10/2018 FINDINGS: Brain: No evidence of acute infarction, hemorrhage, hydrocephalus, extra-axial collection or mass lesion/mass effect. Large remote right MCA territory infarct in the superior division primarily affecting the parietal and posterior frontal lobes. Age congruent cerebral volume loss. Vascular: Atherosclerotic calcification.  No hyperdense vessel. Skull: Negative Sinuses/Orbits: Bilateral cataract resection Other: These results were communicated to Dr. Cheral Marker at 11:27 amon 5/8/2020by text page via the [facility] messaging system. ASPECTS [facility] Stroke Program Early CT Score) Not scored with this nonspecific history. IMPRESSION: 1. No acute finding. 2. Large remote right MCA branch infarct. Electronically Signed   By: Monte Fantasia M.D.   On: 06/25/2018 11:28     Kathrene Alu, MD 06/26/2018, 8:55 AM PGY-2, [place] Intern pager: [phone], text pages welcome

## 2018-06-27 DIAGNOSIS — R251 Tremor, unspecified: Secondary | ICD-10-CM | POA: Diagnosis not present

## 2018-06-27 DIAGNOSIS — R531 Weakness: Secondary | ICD-10-CM | POA: Diagnosis not present

## 2018-06-27 DIAGNOSIS — R569 Unspecified convulsions: Secondary | ICD-10-CM | POA: Diagnosis not present

## 2018-06-27 LAB — CBC
HCT: 33 % — ABNORMAL LOW (ref 36.0–46.0)
Hemoglobin: 10.4 g/dL — ABNORMAL LOW (ref 12.0–15.0)
MCH: 27.2 pg (ref 26.0–34.0)
MCHC: 31.5 g/dL (ref 30.0–36.0)
MCV: 86.4 fL (ref 80.0–100.0)
Platelets: 235 10*3/uL (ref 150–400)
RBC: 3.82 MIL/uL — ABNORMAL LOW (ref 3.87–5.11)
RDW: 15.8 % — ABNORMAL HIGH (ref 11.5–15.5)
WBC: 10.9 10*3/uL — ABNORMAL HIGH (ref 4.0–10.5)
nRBC: 0 % (ref 0.0–0.2)

## 2018-06-27 MED ORDER — LEVETIRACETAM ER 500 MG PO TB24
1000.0000 mg | ORAL_TABLET | Freq: Every day | ORAL | Status: DC
  Administered 2018-06-27: 11:00:00 1000 mg via ORAL
  Filled 2018-06-27: qty 2

## 2018-06-27 MED ORDER — ENSURE ENLIVE PO LIQD: 237.0000 mL | Freq: Two times a day (BID) | ORAL | 12 refills | Status: DC

## 2018-06-27 MED ORDER — LEVETIRACETAM 1000 MG PO TABS: 1000.0000 mg | ORAL_TABLET | Freq: Two times a day (BID) | ORAL | 0 refills | Status: DC

## 2018-06-27 NOTE — Progress Notes (Signed)
Patient is discharging home with home health. Home health is set up. Family here for transport home. All belongings sent with patient. All discharge paperwork went over with patient and family. All questions and concerns addressed. Patient taken down in wheelchair. [place]

## 2018-06-27 NOTE — TOC Transition Note (Signed)
Transition of Care [facility]) - CM/SW Discharge Note   Patient Details  Name: Sonya [name] MRN: [id number] Date of Birth: 1932/02/24  [facility] CM/SW Contact:  Carles Collet, RN Phone Number: 06/27/2018, 9:10 AM   Clinical Narrative:    Spoke w patient's son, she is active w [facility]. Notified [facility] of patient's DC today and [place] orders. Patient's son denies need for additional support, has all needed DME at home. No further CM needs identified.     Final next level of care: [place] Barriers to Discharge: No Barriers Identified   Patient Goals and CMS Choice Patient states their goals for this hospitalization and ongoing recovery are:: to return home   Choice offered to / list presented to : Adult Children  Discharge Placement                       Discharge Plan and Services     Post Acute Care Choice: Resumption of Svcs/PTA Provider, Home Health                    HH Arranged: PT, OT, Speech Therapy HH Agency: [facility] Date [place]: 06/27/18 Time [place]: [phone] Representative spoke with at [place]: [place] ([place]) Interventions     Readmission Risk Interventions Readmission Risk Prevention Plan 06/27/2018  Transportation Screening Complete  Medication Review [employer]) Complete  PCP or Specialist appointment within 3-5 days of discharge Not Complete  PCP/Specialist Appt Not Complete comments unable to schedule appointment on weekend  [facility] or [place] Complete  SW Recovery Care/Counseling Consult Complete  [place] Not Applicable  Some recent data might be hidden

## 2018-07-06 ENCOUNTER — Other Ambulatory Visit: Payer: Medicare PPO | Admitting: Primary Care

## 2018-07-06 ENCOUNTER — Other Ambulatory Visit: Payer: Self-pay

## 2018-07-06 DIAGNOSIS — Z515 Encounter for palliative care: Secondary | ICD-10-CM

## 2018-07-06 NOTE — Progress Notes (Signed)
[facility] Palliative Care Consult Note Telephone: [phone]  Fax: [phone]   TELEHEALTH VISIT STATEMENT Due to the COVID-19 crisis, this visit was done via telemedicine from my office. It was initiated and consented to by this patient and/or family.  PATIENT NAME: Sonya [name] DOB: 10-Nov-1932 MRN: [id number]  PRIMARY CARE PROVIDER:   Baxter Hire, MD  REFERRING PROVIDER:  Baxter Hire, MD [place], [place] [address]  RESPONSIBLE PARTY:    Extended Emergency Contact Information Primary Emergency Contact: Sonya, [name] Address: [address]          [place], [place] [address] Home Phone: [phone] Work Phone: [phone] Relation: Son Secondary Emergency Contact: Sonya, [name] Mobile Phone: [phone] Relation: [place] was asked to follow patient by consultation request of Dr. Edwina Barth, Chrystie Nose, MD  . This is a follow up visit.  ASSESSMENT and RECOMMENDATIONS:   1. ADLs: Continue to monitor for increasing needs as disease declines. Has caregiver for bathing and son helps with hair washing.She lives with her son and he is able to stop work to take care of her.  Patient two person transfer. Son states he gets her OOB during the day and tries to maintain a schedule. Currently has home health and therapy working with pt.   2. Seizures: Continue to monitor, has Keppra now. Had a seizure s/p CVA. Had a seizure during PT at home and went to ED for assessment. No CVA this time, but begun on Keppra. States some sleepiness but not a big problem.  3. Cognition: Continue to monitor in light of CVA, seizures. Some baseline forgetfulness. Knows people and events, watches television for passtime. No overt changes in neuro function s/p seizure per son report.  4. Reviewed medications: Son states some recently have been d/c'ed due to pill burden. He administers from bottles daily, which he wants  to do vs setting up  Weekly med box.   5. Goals of Care: Son is HCPOA. States she has DNR, will upload to [facility] today. States they have discussed wishes, will review MOST form at next in person visit as they don't have a smart device. Visit consisted of counseling and education dealing with the complex and emotionally intense issues of symptom management and palliative care in the setting of serious and potentially life-threatening illness.  Palliative care will continue to follow for goals of care clarification and symptom management. Return 4 weeks or prn. Home health will d/c in roughly 4 weeks.  I spent 25 minutes providing this consultation,  from 1100 to 1125. More than 50% of the time in this consultation was spent coordinating communication.   HISTORY OF PRESENT ILLNESS:  Sonya [name] is a 85 y.o. year old female with multiple medical problems including CAD, h/o breast cancer, CVA, seizure disorder, HTN, hyperlipidemia. Palliative Care was asked to help address goals of care.   CODE STATUS: DNR  PPS: 30% HOSPICE ELIGIBILITY/DIAGNOSIS: TBD  PAST MEDICAL HISTORY:  Past Medical History:  Diagnosis Date  . CAD (coronary artery disease)   . Carpal tunnel syndrome   . GERD (gastroesophageal reflux disease)   . History of shingles   . Hormone receptor positive breast cancer ([place]) 1960  . Hyperlipemia   . Hypertension   . Hypothyroidism   . Stroke [facility])    left sided weakness    SOCIAL HX:  Social History   Tobacco Use  . Smoking status: Former Smoker  Packs/day: 0.50    Years: 20.00    Pack years: 10.00    Types: Cigarettes    Last attempt to quit: 02/17/1998    Years since quitting: 20.3  . Smokeless tobacco: Never Used  Substance Use Topics  . Alcohol use: Not Currently    ALLERGIES:  Allergies  Allergen Reactions  . Clopidogrel Nausea And Vomiting     reported by [place] 11/24/13  . Omeprazole Other (See Comments)    Unknown reaction -  reported by [place] 11/24/13     PERTINENT MEDICATIONS:  Outpatient Encounter Medications as of 07/06/2018  Medication Sig  . acetaminophen (TYLENOL) 325 MG suppository Place 1 suppository (325 mg total) rectally every 4 (four) hours as needed for mild pain (or temp >/= 101 F). (Patient not taking: Reported on 06/26/2018)  . acetaminophen (TYLENOL) 325 MG tablet Take 1 tablet (325 mg total) by mouth every 4 (four) hours as needed for mild pain (or temp >/= 101 F). (Patient not taking: Reported on 06/26/2018)  . apixaban (ELIQUIS) 2.5 MG TABS tablet Take 1 tablet (2.5 mg total) by mouth 2 (two) times daily.  Marland Kitchen aspirin EC 81 MG EC tablet Take 1 tablet (81 mg total) by mouth daily. (Patient not taking: Reported on 06/26/2018)  . B Complex-C (B-COMPLEX WITH VITAMIN C) tablet Take 1 tablet by mouth daily. (Patient not taking: Reported on 06/26/2018)  . bisacodyl (DUCODYL) 5 MG EC tablet Take 2 tablets (10 mg total) by mouth daily. (Patient taking differently: Take 5 mg by mouth 2 (two) times daily. )  . Cholecalciferol (VITAMIN D3) 1000 units CAPS Take 1,000 Units by mouth 2 (two) times a day.   . docusate sodium (COLACE) 100 MG capsule Take 100 mg by mouth 2 (two) times daily.  . feeding supplement, ENSURE ENLIVE, (ENSURE ENLIVE) LIQD Take 237 mLs by mouth 2 (two) times daily between meals.  . ferrous sulfate 325 (65 FE) MG tablet Take 1 tablet (325 mg total) by mouth 2 (two) times daily with a meal. (Patient not taking: Reported on 06/26/2018)  . gabapentin (NEURONTIN) 100 MG capsule Take 100 mg by mouth 2 (two) times daily.   . hydrochlorothiazide (MICROZIDE) 12.5 MG capsule Take 12.5 mg by mouth daily.  . lansoprazole (PREVACID) 30 MG capsule Take 1 capsule (30 mg total) by mouth daily. (Patient not taking: Reported on 06/26/2018)  . levETIRAcetam (KEPPRA) 1000 MG tablet Take 1 tablet (1,000 mg total) by mouth 2 (two) times daily.  Marland Kitchen levothyroxine (SYNTHROID, LEVOTHROID) 25 MCG tablet Take  25 mcg by mouth daily before breakfast.   . Melatonin 5 MG CAPS Take 5 mg by mouth at bedtime.   . metoprolol tartrate (LOPRESSOR) 25 MG tablet Take 1 tablet (25 mg total) by mouth 2 (two) times daily.  . midodrine (PROAMATINE) 2.5 MG tablet Take 1 tablet (2.5 mg total) by mouth 2 (two) times daily with a meal.  . omeprazole (PRILOSEC) 20 MG capsule Take 20 mg by mouth 2 (two) times daily before a meal.  . ondansetron (ZOFRAN-ODT) 4 MG disintegrating tablet Take 1 tablet (4 mg total) by mouth every 8 (eight) hours as needed for nausea or vomiting. (Patient not taking: Reported on 06/26/2018)  . simvastatin (ZOCOR) 40 MG tablet Take 1 tablet (40 mg total) by mouth daily at 6 PM.  . venlafaxine XR (EFFEXOR-XR) 150 MG 24 hr capsule Take 150 mg by mouth at bedtime.   No facility-administered encounter medications on file  as of 07/06/2018.     PHYSICAL EXAM/ROS Weight unavailable General: NAD, frail , sleeping more at daytime Cardiovascular: no chest pain,  Pulmonary: no cough, no SOB Abdomen: appetite good, bms every other day with laxative administration GU: denies dysuria Extremities: no edema, no joint deformities , needs assist to transfer Skin: no rashes or wounds reported. Neurological: Weakness , forgetful, sleeping during day, up at night. Denies hallucinations.  Cyndia Skeeters DNP, AGPCNP-BC

## 2018-07-19 ENCOUNTER — Other Ambulatory Visit: Payer: Self-pay | Admitting: Family Medicine

## 2018-08-16 ENCOUNTER — Other Ambulatory Visit (INDEPENDENT_AMBULATORY_CARE_PROVIDER_SITE_OTHER): Payer: Self-pay | Admitting: Vascular Surgery

## 2018-08-16 ENCOUNTER — Other Ambulatory Visit (INDEPENDENT_AMBULATORY_CARE_PROVIDER_SITE_OTHER): Payer: Self-pay | Admitting: Nurse Practitioner

## 2018-08-16 DIAGNOSIS — Z9889 Other specified postprocedural states: Secondary | ICD-10-CM

## 2018-08-16 DIAGNOSIS — I771 Stricture of artery: Secondary | ICD-10-CM

## 2018-08-16 DIAGNOSIS — I6522 Occlusion and stenosis of left carotid artery: Secondary | ICD-10-CM

## 2018-08-18 ENCOUNTER — Encounter (INDEPENDENT_AMBULATORY_CARE_PROVIDER_SITE_OTHER): Payer: Self-pay | Admitting: Nurse Practitioner

## 2018-08-18 ENCOUNTER — Other Ambulatory Visit: Payer: Self-pay

## 2018-08-18 ENCOUNTER — Ambulatory Visit (INDEPENDENT_AMBULATORY_CARE_PROVIDER_SITE_OTHER): Payer: Medicare PPO

## 2018-08-18 ENCOUNTER — Ambulatory Visit (INDEPENDENT_AMBULATORY_CARE_PROVIDER_SITE_OTHER): Payer: Medicare PPO | Admitting: Nurse Practitioner

## 2018-08-18 VITALS — BP 167/76 | HR 64 | Resp 16 | Ht 63.0 in | Wt 110.0 lb

## 2018-08-18 DIAGNOSIS — I6523 Occlusion and stenosis of bilateral carotid arteries: Secondary | ICD-10-CM

## 2018-08-18 DIAGNOSIS — Z79899 Other long term (current) drug therapy: Secondary | ICD-10-CM

## 2018-08-18 DIAGNOSIS — Z9889 Other specified postprocedural states: Secondary | ICD-10-CM | POA: Diagnosis not present

## 2018-08-18 DIAGNOSIS — I771 Stricture of artery: Secondary | ICD-10-CM

## 2018-08-18 DIAGNOSIS — I6522 Occlusion and stenosis of left carotid artery: Secondary | ICD-10-CM | POA: Diagnosis not present

## 2018-08-18 DIAGNOSIS — K219 Gastro-esophageal reflux disease without esophagitis: Secondary | ICD-10-CM

## 2018-08-18 DIAGNOSIS — Z7982 Long term (current) use of aspirin: Secondary | ICD-10-CM

## 2018-08-18 DIAGNOSIS — E785 Hyperlipidemia, unspecified: Secondary | ICD-10-CM

## 2018-08-18 DIAGNOSIS — Z7901 Long term (current) use of anticoagulants: Secondary | ICD-10-CM

## 2018-08-18 DIAGNOSIS — Z87891 Personal history of nicotine dependence: Secondary | ICD-10-CM

## 2018-08-18 NOTE — Progress Notes (Signed)
SUBJECTIVE:  Patient ID: Sonya [name], female    DOB: December 27, 1932, 85 y.o.   MRN: [id number] Chief Complaint  Patient presents with  . Follow-up    ultrasound    HPI  Sonya [name] is a 85 y.o. female The patient is seen for follow up evaluation of carotid stenosis status post left carotid endarterectomy on 05/19/2018.  There were no post operative problems or complications related to the surgery.  The patient denies neck or incisional pain.  The patient has her son present in the room with her.  The patient is largely nonverbal  The patient denies interval amaurosis fugax.   Prior CVA  The patient denies headache.  The patient is taking enteric-coated aspirin 81 mg daily.  The patient has a history of coronary artery disease, no recent episodes of angina or shortness of breath. The patient denies PAD or claudication symptoms. There is a history of hyperlipidemia which is being treated with a statin.   Noninvasive studies show a 1 to 39% stenosis of the bilateral internal carotid arteries.  Past Medical History:  Diagnosis Date  . CAD (coronary artery disease)   . Carpal tunnel syndrome   . GERD (gastroesophageal reflux disease)   . History of shingles   . Hormone receptor positive breast cancer ([place]) 1960  . Hyperlipemia   . Hypertension   . Hypothyroidism   . Stroke [facility])    left sided weakness    Past Surgical History:  Procedure Laterality Date  . ABDOMINAL HYSTERECTOMY    . APPENDECTOMY    . CAROTID ANGIOGRAPHY Bilateral 03/31/2018   Procedure: CAROTID ANGIOGRAPHY;  Surgeon: Algernon Huxley, MD;  Location: [place] [facility];  Service: Cardiovascular;  Laterality: Bilateral;  . cornoary angioplasty    . ENDARTERECTOMY Left 05/19/2018   Procedure: ENDARTERECTOMY CAROTID;  Surgeon: Algernon Huxley, MD;  Location: [facility] OR[facility];  Service: Vascular;  Laterality: Left;  . ENDOSCOPIC RETROGRADE CHOLANGIOPANCREATOGRAPHY (ERCP) WITH PROPOFOL N/A 11/10/2017   Procedure:  ENDOSCOPIC RETROGRADE CHOLANGIOPANCREATOGRAPHY (ERCP) WITH PROPOFOL;  Surgeon: Lucilla Lame, MD;  Location: [facility] ENDOSCOPY;  Service: Endoscopy;  Laterality: N/A;  . ESOPHAGOGASTRODUODENOSCOPY N/A 03/23/2018   Procedure: ESOPHAGOGASTRODUODENOSCOPY (EGD);  Surgeon: Lin Landsman, MD;  Location: [facility];  Service: Gastroenterology;  Laterality: N/A;  . ESOPHAGOGASTRODUODENOSCOPY (EGD) WITH PROPOFOL N/A 11/06/2017   Procedure: ESOPHAGOGASTRODUODENOSCOPY (EGD) WITH PROPOFOL;  Surgeon: Lucilla Lame, MD;  Location: [facility] ENDOSCOPY;  Service: Endoscopy;  Laterality: N/A;  . PATCH ANGIOPLASTY Left 05/19/2018   Procedure: PATCH ANGIOPLASTY;  Surgeon: Algernon Huxley, MD;  Location: [facility] ORS;  Service: Vascular;  Laterality: Left;    Social History   Socioeconomic History  . Marital status: Married    Spouse name: Not on file  . Number of children: Not on file  . Years of education: Not on file  . Highest education level: Not on file  Occupational History  . Not on file  Social Needs  . Financial resource strain: Not on file  . Food insecurity    Worry: Not on file    Inability: Not on file  . Transportation needs    Medical: Not on file    Non-medical: Not on file  Tobacco Use  . Smoking status: Former Smoker    Packs/day: 0.50    Years: 20.00    Pack years: 10.00    Types: Cigarettes    Quit date: 02/17/1998    Years since quitting: 20.5  . Smokeless tobacco: Never Used  Substance and Sexual  Activity  . Alcohol use: Not Currently  . Drug use: Never  . Sexual activity: Not on file  Lifestyle  . Physical activity    Days per week: Not on file    Minutes per session: Not on file  . Stress: Not on file  Relationships  . Social Herbalist on phone: Not on file    Gets together: Not on file    Attends religious service: Not on file    Active member of club or organization: Not on file    Attends meetings of clubs or organizations: Not on file    Relationship status:  Not on file  . Intimate partner violence    Fear of current or ex partner: Not on file    Emotionally abused: Not on file    Physically abused: Not on file    Forced sexual activity: Not on file  Other Topics Concern  . Not on file  Social History Narrative   Currently at home, bedbound mostly, son helping    Family History  Problem Relation Age of Onset  . Hypertension Son   . Ovarian cancer Mother   . Stroke Father   . Stroke Son     Allergies  Allergen Reactions  . Clopidogrel Nausea And Vomiting     reported by [place] 11/24/13  . Omeprazole Other (See Comments)    Unknown reaction - reported by [place] 11/24/13     Review of Systems   Review of Systems: Negative Unless Checked Constitutional: [] Weight loss  [] Fever  [] Chills Cardiac: [] Chest pain   []  Atrial Fibrillation  [] Palpitations   [] Shortness of breath when laying flat   [] Shortness of breath with exertion. [] Shortness of breath at rest Vascular:  [] Pain in legs with walking   [] Pain in legs with standing [] Pain in legs when laying flat   [] Claudication    [] Pain in feet when laying flat    [] History of DVT   [] Phlebitis   [] Swelling in legs   [] Varicose veins   [] Non-healing ulcers Pulmonary:   [] Uses home oxygen   [] Productive cough   [] Hemoptysis   [] Wheeze  [] COPD   [] Asthma Neurologic:  [] Dizziness   [] Seizures  [] Blackouts [x] History of stroke   [] History of TIA  [] Aphasia   [] Temporary Blindness   [] Weakness or numbness in arm   [] Weakness or numbness in leg Musculoskeletal:   [] Joint swelling   [] Joint pain   [] Low back pain  []  History of Knee Replacement [] Arthritis [] back Surgeries  []  Spinal Stenosis    Hematologic:  [] Easy bruising  [] Easy bleeding   [] Hypercoagulable state   [] Anemic Gastrointestinal:  [] Diarrhea   [] Vomiting  [x] Gastroesophageal reflux/heartburn   [] Difficulty swallowing. [] Abdominal pain Genitourinary:  [] Chronic kidney disease    [] Difficult urination  [] Anuric   [] Blood in urine [] Frequent urination  [] Burning with urination   [] Hematuria Skin:  [] Rashes   [] Ulcers [] Wounds Psychological:  [] History of anxiety   []  History of major depression  []  Memory Difficulties      OBJECTIVE:   Physical Exam  BP (!) 167/76   Pulse 64   Resp 16   Ht 5\' 3"  (1.6 m)   Wt 110 lb (49.9 kg)   BMI 19.49 kg/m   Gen: WD/WN, NAD Head: [place]/AT, No temporalis wasting.  Ear/Nose/Throat: Hearing grossly intact, nares w/o erythema or drainage Eyes: PER, EOMI, sclera nonicteric.  Neck: Supple, no masses.  No JVD.  No  carotid bruits auscultated Pulmonary:  Good air movement, no use of accessory muscles.  Cardiac: RRR Vascular:  Left carotid endarterectomy incision scar completely healed Vessel Right Left  Radial Palpable Palpable  Gastrointestinal: soft, non-distended. No guarding/no peritoneal signs.  Musculoskeletal: M/S 5/5 throughout.  No deformity or atrophy.  Neurologic: Pain and light touch intact in extremities.  Symmetrical.  Speech is fluent. Motor exam as listed above. Psychiatric: Judgment intact, Mood & affect appropriate for pt's clinical situation. Dermatologic: No Venous rashes. No Ulcers Noted.  No changes consistent with cellulitis. Lymph : No Cervical lymphadenopathy, no lichenification or skin changes of chronic lymphedema.       ASSESSMENT AND PLAN:  1. Bilateral carotid artery stenosis Recommend:  The patient is s/p successful 1-39% CEA  Noninvasive studies show a 1 to 39% stenosis of the bilateral internal carotid arteries.  Continue antiplatelet therapy as prescribed Continue management of CAD, HTN and Hyperlipidemia Healthy heart diet,  encouraged exercise at least 4 times per week  Follow up in 6 months with duplex ultrasound and physical exam based on the patient's carotid surgery   - VAS US CAROTID; Future  2. Hyperlipidemia, unspecified hyperlipidemia type Continue statin as ordered and  reviewed, no changes at this time   3. Gastroesophageal reflux disease, esophagitis presence not specified Continue PPI as already ordered, this medication has been reviewed and there are no changes at this time.  Avoidence of caffeine and alcohol  Moderate elevation of the head of the bed    Current Outpatient Medications on File Prior to Visit  Medication Sig Dispense Refill  . acetaminophen (TYLENOL) 325 MG suppository Place 1 suppository (325 mg total) rectally every 4 (four) hours as needed for mild pain (or temp >/= 101 F). 12 suppository 0  . apixaban (ELIQUIS) 2.5 MG TABS tablet Take 1 tablet (2.5 mg total) by mouth 2 (two) times daily. 60 tablet 0  . bisacodyl (DUCODYL) 5 MG EC tablet Take 2 tablets (10 mg total) by mouth daily. 30 tablet 0  . Cholecalciferol (VITAMIN D3) 1000 units CAPS Take 1,000 Units by mouth 2 (two) times a day.     . docusate sodium (COLACE) 100 MG capsule Take 100 mg by mouth 2 (two) times daily.    Marland Kitchen gabapentin (NEURONTIN) 100 MG capsule Take 100 mg by mouth 2 (two) times daily.     . hydrochlorothiazide (MICROZIDE) 12.5 MG capsule Take 12.5 mg by mouth daily.    . lansoprazole (PREVACID) 30 MG capsule Take 1 capsule (30 mg total) by mouth daily. 30 capsule 1  . levETIRAcetam (KEPPRA) 1000 MG tablet Take 1 tablet (1,000 mg total) by mouth 2 (two) times daily. 60 tablet 0  . levothyroxine (SYNTHROID, LEVOTHROID) 25 MCG tablet Take 25 mcg by mouth daily before breakfast.     . Melatonin 5 MG CAPS Take 5 mg by mouth at bedtime.     . metoprolol tartrate (LOPRESSOR) 25 MG tablet Take 1 tablet (25 mg total) by mouth 2 (two) times daily. 60 tablet 0  . midodrine (PROAMATINE) 2.5 MG tablet Take 1 tablet (2.5 mg total) by mouth 2 (two) times daily with a meal. 60 tablet 1  . omeprazole (PRILOSEC) 20 MG capsule Take 20 mg by mouth 2 (two) times daily before a meal.    . ondansetron (ZOFRAN-ODT) 4 MG disintegrating tablet Take 1 tablet (4 mg total) by mouth every 8  (eight) hours as needed for nausea or vomiting. 20 tablet 0  . simvastatin (ZOCOR)  40 MG tablet Take 1 tablet (40 mg total) by mouth daily at 6 PM. 30 tablet 0  . venlafaxine XR (EFFEXOR-XR) 150 MG 24 hr capsule Take 150 mg by mouth at bedtime.    Marland Kitchen acetaminophen (TYLENOL) 325 MG tablet Take 1 tablet (325 mg total) by mouth every 4 (four) hours as needed for mild pain (or temp >/= 101 F). (Patient not taking: Reported on 06/26/2018)    . aspirin EC 81 MG EC tablet Take 1 tablet (81 mg total) by mouth daily. (Patient not taking: Reported on 06/26/2018)    . B Complex-C (B-COMPLEX WITH VITAMIN C) tablet Take 1 tablet by mouth daily. (Patient not taking: Reported on 06/26/2018)    . feeding supplement, ENSURE ENLIVE, (ENSURE ENLIVE) LIQD Take 237 mLs by mouth 2 (two) times daily between meals. (Patient not taking: Reported on 08/18/2018) 237 mL 12  . ferrous sulfate 325 (65 FE) MG tablet Take 1 tablet (325 mg total) by mouth 2 (two) times daily with a meal. (Patient not taking: Reported on 06/26/2018) 60 tablet 3   No current facility-administered medications on file prior to visit.     There are no Patient Instructions on file for this visit. No follow-ups on file.   Kris Hartmann, NP  This note was completed with Sales executive.  Any errors are purely unintentional.

## 2018-09-09 ENCOUNTER — Other Ambulatory Visit: Payer: Self-pay

## 2018-09-09 ENCOUNTER — Other Ambulatory Visit: Payer: Medicare PPO | Admitting: Primary Care

## 2018-09-09 DIAGNOSIS — Z515 Encounter for palliative care: Secondary | ICD-10-CM

## 2018-09-09 NOTE — Progress Notes (Signed)
[facility] Palliative Care Consult Note Telephone: [phone]  Fax: [phone]  PATIENT NAME: Sonya [name] DOB: 04/06/1932 MRN: [id number]  PRIMARY CARE PROVIDER:   Baxter Hire, MD [phone]  REFERRING PROVIDER:  Baxter Hire, MD [address] [place],  [place] [address] [phone]  RESPONSIBLE PARTY:   Extended Emergency Contact Information Primary Emergency Contact: JOVANA, REMBOLD Address: [address]          [place], [place] [address] Home Phone: [phone] Work Phone: [phone] Relation: Son Secondary Emergency Contact: elizabth, palka Mobile Phone: [phone] Relation: [place] was asked to follow this patient by consultation request of Baxter Hire, MD. This is a follow up visit.  ASSESSMENT AND RECOMMENDATIONS:   1. Goals of Care: Maximize quality of life and symptom management.  2. Symptom Management:   Deficits from CVA: Wants to walk more, not able to use left side. Patient is alert and oriented, and able to discuss starting a home exercise plan. I instructed her to use hand weighs and do ROM exercises in bed, and to make a daily plan.  Seizure disorder; On Keppra, asked if this was lifelong to which I said yes. No further seizure activity beyond initial.   3. Family /Caregiver/Community Supports:  Son showed me card from [facility], which they plan to start using, stating they told him they would come 3 times a week. She will have [facility] until d/c. I will d/c as Hospice is starting and am happy to resume if for some reason they do not elect hospice.  4. Cognitive / Functional decline: No cognitive decline in recent past/Mostly bed bound, getting up with help, pivot transfers.    5. Advanced Care Directive: DNR  6. Follow up Palliative Care Visit: Palliative care will continue to follow for goals of care clarification and symptom  management. Return  PRN if no hospice admission.  I spent 40 minutes providing this consultation,  from 1430 to 1510. More than 50% of the time in this consultation was spent coordinating communication.   HISTORY OF PRESENT ILLNESS:  Sonya [name] is a 85 y.o. year old female with multiple medical problems including CAD, h/o breast cancer, CVA, seizure disorder, HTN, hyperlipidemia. Palliative Care was asked to help address goals of care.   CODE STATUS: DNR  PPS: 30% HOSPICE ELIGIBILITY/DIAGNOSIS: TBD  PAST MEDICAL HISTORY:  Past Medical History:  Diagnosis Date  . CAD (coronary artery disease)   . Carpal tunnel syndrome   . GERD (gastroesophageal reflux disease)   . History of shingles   . Hormone receptor positive breast cancer ([place]) 1960  . Hyperlipemia   . Hypertension   . Hypothyroidism   . Stroke [facility])    left sided weakness    SOCIAL HX:  Social History   Tobacco Use  . Smoking status: Former Smoker    Packs/day: 0.50    Years: 20.00    Pack years: 10.00    Types: Cigarettes    Quit date: 02/17/1998    Years since quitting: 20.5  . Smokeless tobacco: Never Used  Substance Use Topics  . Alcohol use: Not Currently    ALLERGIES:  Allergies  Allergen Reactions  . Clopidogrel Nausea And Vomiting     reported by [place] 11/24/13  . Omeprazole Other (See Comments)    Unknown reaction - reported by [place] 11/24/13     PERTINENT MEDICATIONS:  Outpatient Encounter Medications as of 09/09/2018  Medication Sig  . acetaminophen (TYLENOL) 325 MG suppository Place 1 suppository (325 mg total) rectally every 4 (four) hours as needed for mild pain (or temp >/= 101 F).  Marland Kitchen acetaminophen (TYLENOL) 325 MG tablet Take 1 tablet (325 mg total) by mouth every 4 (four) hours as needed for mild pain (or temp >/= 101 F). (Patient not taking: Reported on 06/26/2018)  . apixaban (ELIQUIS) 2.5 MG TABS tablet Take 1 tablet (2.5 mg total) by mouth 2  (two) times daily.  Marland Kitchen aspirin EC 81 MG EC tablet Take 1 tablet (81 mg total) by mouth daily. (Patient not taking: Reported on 06/26/2018)  . B Complex-C (B-COMPLEX WITH VITAMIN C) tablet Take 1 tablet by mouth daily. (Patient not taking: Reported on 06/26/2018)  . bisacodyl (DUCODYL) 5 MG EC tablet Take 2 tablets (10 mg total) by mouth daily.  . Cholecalciferol (VITAMIN D3) 1000 units CAPS Take 1,000 Units by mouth 2 (two) times a day.   . docusate sodium (COLACE) 100 MG capsule Take 100 mg by mouth 2 (two) times daily.  . feeding supplement, ENSURE ENLIVE, (ENSURE ENLIVE) LIQD Take 237 mLs by mouth 2 (two) times daily between meals. (Patient not taking: Reported on 08/18/2018)  . ferrous sulfate 325 (65 FE) MG tablet Take 1 tablet (325 mg total) by mouth 2 (two) times daily with a meal. (Patient not taking: Reported on 06/26/2018)  . gabapentin (NEURONTIN) 100 MG capsule Take 100 mg by mouth 2 (two) times daily.   . hydrochlorothiazide (MICROZIDE) 12.5 MG capsule Take 12.5 mg by mouth daily.  . lansoprazole (PREVACID) 30 MG capsule Take 1 capsule (30 mg total) by mouth daily.  Marland Kitchen levETIRAcetam (KEPPRA) 1000 MG tablet Take 1 tablet (1,000 mg total) by mouth 2 (two) times daily.  Marland Kitchen levothyroxine (SYNTHROID, LEVOTHROID) 25 MCG tablet Take 25 mcg by mouth daily before breakfast.   . Melatonin 5 MG CAPS Take 5 mg by mouth at bedtime.   . metoprolol tartrate (LOPRESSOR) 25 MG tablet Take 1 tablet (25 mg total) by mouth 2 (two) times daily.  . midodrine (PROAMATINE) 2.5 MG tablet Take 1 tablet (2.5 mg total) by mouth 2 (two) times daily with a meal.  . omeprazole (PRILOSEC) 20 MG capsule Take 20 mg by mouth 2 (two) times daily before a meal.  . ondansetron (ZOFRAN-ODT) 4 MG disintegrating tablet Take 1 tablet (4 mg total) by mouth every 8 (eight) hours as needed for nausea or vomiting.  . simvastatin (ZOCOR) 40 MG tablet Take 1 tablet (40 mg total) by mouth daily at 6 PM.  . venlafaxine XR (EFFEXOR-XR) 150 MG 24  hr capsule Take 150 mg by mouth at bedtime.   No facility-administered encounter medications on file as of 09/09/2018.     PHYSICAL EXAM/ROS:   Current and past weights: had lost some weight subjectively General: NAD, frail appearing, thin Cardiovascular: no chest pain reported, no edema,  Pulmonary: no cough, no increased SOB Abdomen: appetite good, endorses constipation, incontinent of bowel GU: denies dysuria, incontinent of urine MSK:  no joint deformities, mostly bed bound, states MSK pain, inactivity, Left sided weakness.  Skin: no rashes or wounds reported Neurological: Weakness, CVA a year ago , left weakness. Alert and oriented x 3. , had carotid surgery recently. No further syncope, seizure x 1.   Cyndia Skeeters DNP AGPCNP-BC   COVID-19 PATIENT SCREENING TOOL  Person answering questions: ______Self_____________ _____   1.  Is the patient  or any family member in the home showing any signs or symptoms regarding respiratory infection?              Person with Symptom- ________na___________________  a. Fever                                                                          Yes___ No_x__          ___________________  b. Shortness of breath                                                    Yes___ No_x__          ___________________ c. Cough/congestion                                       Yes___  No__x_         ___________________ d. Body aches/pains                                                         Yes___ No_x__        ____________________ e. Gastrointestinal symptoms (diarrhea, nausea)           Yes___ No__x_        ____________________

## 2018-10-29 DIAGNOSIS — Z86718 Personal history of other venous thrombosis and embolism: Secondary | ICD-10-CM | POA: Insufficient documentation

## 2018-10-29 DIAGNOSIS — I2510 Atherosclerotic heart disease of native coronary artery without angina pectoris: Secondary | ICD-10-CM | POA: Insufficient documentation

## 2019-02-08 ENCOUNTER — Other Ambulatory Visit (INDEPENDENT_AMBULATORY_CARE_PROVIDER_SITE_OTHER): Payer: Self-pay | Admitting: Nurse Practitioner

## 2019-02-08 DIAGNOSIS — I6523 Occlusion and stenosis of bilateral carotid arteries: Secondary | ICD-10-CM

## 2019-02-08 DIAGNOSIS — Z95820 Peripheral vascular angioplasty status with implants and grafts: Secondary | ICD-10-CM

## 2019-02-08 DIAGNOSIS — Z9889 Other specified postprocedural states: Secondary | ICD-10-CM

## 2019-02-22 ENCOUNTER — Encounter (INDEPENDENT_AMBULATORY_CARE_PROVIDER_SITE_OTHER): Payer: Self-pay | Admitting: Vascular Surgery

## 2019-02-22 ENCOUNTER — Ambulatory Visit (INDEPENDENT_AMBULATORY_CARE_PROVIDER_SITE_OTHER): Payer: Medicare Other

## 2019-02-22 ENCOUNTER — Encounter (INDEPENDENT_AMBULATORY_CARE_PROVIDER_SITE_OTHER): Payer: Self-pay

## 2019-02-22 ENCOUNTER — Ambulatory Visit (INDEPENDENT_AMBULATORY_CARE_PROVIDER_SITE_OTHER): Payer: Medicare Other | Admitting: Vascular Surgery

## 2019-02-22 ENCOUNTER — Other Ambulatory Visit: Payer: Self-pay

## 2019-02-22 VITALS — BP 148/91 | HR 132 | Resp 15

## 2019-02-22 DIAGNOSIS — I6522 Occlusion and stenosis of left carotid artery: Secondary | ICD-10-CM

## 2019-02-22 DIAGNOSIS — Z95820 Peripheral vascular angioplasty status with implants and grafts: Secondary | ICD-10-CM | POA: Diagnosis not present

## 2019-02-22 DIAGNOSIS — E785 Hyperlipidemia, unspecified: Secondary | ICD-10-CM

## 2019-02-22 DIAGNOSIS — I10 Essential (primary) hypertension: Secondary | ICD-10-CM

## 2019-02-22 DIAGNOSIS — I6523 Occlusion and stenosis of bilateral carotid arteries: Secondary | ICD-10-CM | POA: Diagnosis not present

## 2019-02-22 DIAGNOSIS — Z9889 Other specified postprocedural states: Secondary | ICD-10-CM

## 2019-02-22 NOTE — Progress Notes (Signed)
MRN : [id number]  Sonya [name] is a 86 y.o. (Aug 24, 1932) female who presents with chief complaint of  Chief Complaint  Patient presents with  . Follow-up    ultrasound follow up  .  History of Present Illness: Patient returns in follow-up of her carotid disease. About 9 months ago, she underwent left carotid endarterectomy for high-grade stenosis with previous stroke. She has had no further neurologic symptoms since that time. She remains fairly debilitated from her previous stroke. She has no new complaints today. Carotid duplex today shows mild stenosis in the right carotid artery of less than 40% and a widely patent left carotid endarterectomy without significant recurrent stenosis.  Current Outpatient Medications  Medication Sig Dispense Refill  . acetaminophen (TYLENOL) 325 MG tablet Take 1 tablet (325 mg total) by mouth every 4 (four) hours as needed for mild pain (or temp >/= 101 F).    Marland Kitchen apixaban (ELIQUIS) 2.5 MG TABS tablet Take 1 tablet (2.5 mg total) by mouth 2 (two) times daily. 60 tablet 0  . bisacodyl (DUCODYL) 5 MG EC tablet Take 2 tablets (10 mg total) by mouth daily. 30 tablet 0  . Cholecalciferol (VITAMIN D3) 1000 units CAPS Take 1,000 Units by mouth 2 (two) times a day.     . docusate sodium (COLACE) 100 MG capsule Take 100 mg by mouth 2 (two) times daily.    Marland Kitchen gabapentin (NEURONTIN) 100 MG capsule Take 100 mg by mouth 2 (two) times daily.     . hydrochlorothiazide (MICROZIDE) 12.5 MG capsule Take 12.5 mg by mouth daily.    . lansoprazole (PREVACID) 30 MG capsule Take 1 capsule (30 mg total) by mouth daily. 30 capsule 1  . levothyroxine (SYNTHROID, LEVOTHROID) 25 MCG tablet Take 25 mcg by mouth daily before breakfast.     . Melatonin 5 MG CAPS Take 5 mg by mouth at bedtime.     . metoprolol tartrate (LOPRESSOR) 25 MG tablet Take 1 tablet (25 mg total) by mouth 2 (two) times daily. 60 tablet 0  . midodrine (PROAMATINE) 2.5 MG tablet Take 1 tablet (2.5 mg total) by  mouth 2 (two) times daily with a meal. 60 tablet 1  . omeprazole (PRILOSEC) 20 MG capsule Take 20 mg by mouth 2 (two) times daily before a meal.    . ondansetron (ZOFRAN-ODT) 4 MG disintegrating tablet Take 1 tablet (4 mg total) by mouth every 8 (eight) hours as needed for nausea or vomiting. 20 tablet 0  . simvastatin (ZOCOR) 40 MG tablet Take 1 tablet (40 mg total) by mouth daily at 6 PM. 30 tablet 0  . venlafaxine XR (EFFEXOR-XR) 150 MG 24 hr capsule Take 150 mg by mouth at bedtime.    Marland Kitchen acetaminophen (TYLENOL) 325 MG suppository Place 1 suppository (325 mg total) rectally every 4 (four) hours as needed for mild pain (or temp >/= 101 F). (Patient not taking: Reported on 02/22/2019) 12 suppository 0  . aspirin EC 81 MG EC tablet Take 1 tablet (81 mg total) by mouth daily. (Patient not taking: Reported on 06/26/2018)    . B Complex-C (B-COMPLEX WITH VITAMIN C) tablet Take 1 tablet by mouth daily. (Patient not taking: Reported on 06/26/2018)    . feeding supplement, ENSURE ENLIVE, (ENSURE ENLIVE) LIQD Take 237 mLs by mouth 2 (two) times daily between meals. (Patient not taking: Reported on 08/18/2018) 237 mL 12  . ferrous sulfate 325 (65 FE) MG tablet Take 1 tablet (325 mg total) by mouth 2 (two)  times daily with a meal. (Patient not taking: Reported on 06/26/2018) 60 tablet 3  . levETIRAcetam (KEPPRA) 1000 MG tablet Take 1 tablet (1,000 mg total) by mouth 2 (two) times daily. (Patient not taking: Reported on 02/22/2019) 60 tablet 0   No current facility-administered medications for this visit.    Past Medical History:  Diagnosis Date  . CAD (coronary artery disease)   . Carpal tunnel syndrome   . GERD (gastroesophageal reflux disease)   . History of shingles   . Hormone receptor positive breast cancer ([place]) 1960  . Hyperlipemia   . Hypertension   . Hypothyroidism   . Stroke [facility])    left sided weakness    Past Surgical History:  Procedure Laterality Date  . ABDOMINAL HYSTERECTOMY    .  APPENDECTOMY    . CAROTID ANGIOGRAPHY Bilateral 03/31/2018   Procedure: CAROTID ANGIOGRAPHY;  Surgeon: Algernon Huxley, MD;  Location: [facility];  Service: Cardiovascular;  Laterality: Bilateral;  . cornoary angioplasty    . ENDARTERECTOMY Left 05/19/2018   Procedure: ENDARTERECTOMY CAROTID;  Surgeon: Algernon Huxley, MD;  Location: [facility] ORS;  Service: Vascular;  Laterality: Left;  . ENDOSCOPIC RETROGRADE CHOLANGIOPANCREATOGRAPHY (ERCP) WITH PROPOFOL N/A 11/10/2017   Procedure: ENDOSCOPIC RETROGRADE CHOLANGIOPANCREATOGRAPHY (ERCP) WITH PROPOFOL;  Surgeon: Lucilla Lame, MD;  Location: [facility] ENDOSCOPY;  Service: Endoscopy;  Laterality: N/A;  . ESOPHAGOGASTRODUODENOSCOPY N/A 03/23/2018   Procedure: ESOPHAGOGASTRODUODENOSCOPY (EGD);  Surgeon: Lin Landsman, MD;  Location: [facility] ENDOSCOPY;  Service: Gastroenterology;  Laterality: N/A;  . ESOPHAGOGASTRODUODENOSCOPY (EGD) WITH PROPOFOL N/A 11/06/2017   Procedure: ESOPHAGOGASTRODUODENOSCOPY (EGD) WITH PROPOFOL;  Surgeon: Lucilla Lame, MD;  Location: [facility] ENDOSCOPY;  Service: Endoscopy;  Laterality: N/A;  . PATCH ANGIOPLASTY Left 05/19/2018   Procedure: PATCH ANGIOPLASTY;  Surgeon: Algernon Huxley, MD;  Location: [facility] ORS;  Service: Vascular;  Laterality: Left;     Social History   Tobacco Use  . Smoking status: Former Smoker    Packs/day: 0.50    Years: 20.00    Pack years: 10.00    Types: Cigarettes    Quit date: 02/17/1998    Years since quitting: 21.0  . Smokeless tobacco: Never Used  Substance Use Topics  . Alcohol use: Not Currently  . Drug use: Never    Family History  Problem Relation Age of Onset  . Hypertension Son   . Ovarian cancer Mother   . Stroke Father   . Stroke Son      Allergies  Allergen Reactions  . Clopidogrel Nausea And Vomiting     reported by [place] 11/24/13  . Omeprazole Other (See Comments)    Unknown reaction - reported by [facility] 11/24/13     REVIEW OF  SYSTEMS (Negative unless checked)  Constitutional: [] Weight loss  [] Fever  [] Chills Cardiac: [] Chest pain   [] Chest pressure   [x] Palpitations   [] Shortness of breath when laying flat   [] Shortness of breath at rest   [] Shortness of breath with exertion. Vascular:  [] Pain in legs with walking   [] Pain in legs at rest   [] Pain in legs when laying flat   [] Claudication   [] Pain in feet when walking  [] Pain in feet at rest  [] Pain in feet when laying flat   [] History of DVT   [] Phlebitis   [x] Swelling in legs   [] Varicose veins   [] Non-healing ulcers Pulmonary:   [] Uses home oxygen   [] Productive cough   [] Hemoptysis   [] Wheeze  [] COPD   []   Asthma Neurologic:  [] Dizziness  [] Blackouts   [] Seizures   [x] History of stroke   [] History of TIA  [] Aphasia   [] Temporary blindness   [] Dysphagia   [x] Weakness or numbness in arms   [x] Weakness or numbness in legs Musculoskeletal:  [x] Arthritis   [] Joint swelling   [x] Joint pain   [] Low back pain Hematologic:  [] Easy bruising  [] Easy bleeding   [] Hypercoagulable state   [] Anemic  [] Hepatitis Gastrointestinal:  [] Blood in stool   [] Vomiting blood  [] Gastroesophageal reflux/heartburn   [] Difficulty swallowing. Genitourinary:  [] Chronic kidney disease   [] Difficult urination  [] Frequent urination  [] Burning with urination   [] Blood in urine Skin:  [] Rashes   [] Ulcers   [] Wounds Psychological:  [] History of anxiety   []  History of major depression.  Physical Examination  Vitals:   02/22/19 1106  BP: (!) 148/91  Pulse: (!) 132  Resp: 15   There is no height or weight on file to calculate BMI. Gen:  WD/WN, NAD. Debilitated appearing Head: [place]/AT, No temporalis wasting. Ear/Nose/Throat: Hearing grossly intact, nares w/o erythema or drainage, trachea midline Eyes: Conjunctiva clear. Sclera non-icteric Neck: Supple. No bruit  Pulmonary:  Good air movement, equal and clear to auscultation bilaterally.  Cardiac: tachycardic Vascular:  Vessel Right Left  Radial  Palpable Palpable                Musculoskeletal: Right-sided weakness. Patient in a wheelchair. Mild lower extremity swelling Neurologic: CN 2-12 intact. Sensation grossly intact in extremities. Speech is improved from previous visits. Fairly good today. Psychiatric: Judgment intact, Mood & affect appropriate for pt's clinical situation. Dermatologic: No rashes or ulcers noted.  No cellulitis or open wounds.     CBC Lab Results  Component Value Date   WBC 10.9 (H) 06/27/2018   HGB 10.4 (L) 06/27/2018   HCT 33.0 (L) 06/27/2018   MCV 86.4 06/27/2018   PLT 235 06/27/2018    BMET    Component Value Date/Time   NA 136 06/26/2018 0433   K 3.6 06/26/2018 0433   CL 100 06/26/2018 0433   CO2 24 06/26/2018 0433   GLUCOSE 84 06/26/2018 0433   BUN 13 06/26/2018 0433   CREATININE 0.67 06/26/2018 0433   CALCIUM 9.3 06/26/2018 0433   GFRNONAA >60 06/26/2018 0433   GFRAA >60 06/26/2018 0433   CrCl cannot be calculated (Patient's most recent lab result is older than the maximum 21 days allowed.).  COAG Lab Results  Component Value Date   INR 1.1 06/25/2018   INR 1.1 05/12/2018   INR 1.57 03/22/2018    Radiology CAROTID Bilateral  Result Date: 02/22/2019 Carotid Arterial Duplex Study Indications:       Carotid artery disease. Other Factors:     03/31/2018:Thoracic Aortogram, Cervical and Cerebral                    Bilateral Carotid Angiograms.                    Bilateral Subclavian and Vertebral Artery Angiograms. Comparison Study:  08/18/2018 Performing Technologist: Almira Coaster RVS  Examination Guidelines: A complete evaluation includes B-mode imaging, spectral Doppler, color Doppler, and power Doppler as needed of all accessible portions of each vessel. Bilateral testing is considered an integral part of a complete examination. Limited examinations for reoccurring indications may be performed as noted.  Right Carotid Findings:  +----------+-------+-------+--------+---------------------------------+--------+           PSV    EDV  StenosisPlaque Description               Comments           cm/s   cm/s                                                     +----------+-------+-------+--------+---------------------------------+--------+ CCA Prox  70     13                                                       +----------+-------+-------+--------+---------------------------------+--------+ CCA Mid   74     13                                                       +----------+-------+-------+--------+---------------------------------+--------+ CCA Distal41     11                                                       +----------+-------+-------+--------+---------------------------------+--------+ ICA Prox  59     9              heterogenous, irregular and                                               calcific                                  +----------+-------+-------+--------+---------------------------------+--------+ ICA Mid   65     16                                                       +----------+-------+-------+--------+---------------------------------+--------+ ICA Distal59     15                                                       +----------+-------+-------+--------+---------------------------------+--------+ ECA       136    17             heterogenous, irregular and                                               calcific                                  +----------+-------+-------+--------+---------------------------------+--------+ +----------+--------+-------+--------+-------------------+  PSV cm/sEDV cmsDescribeArm Pressure (mmHG) +----------+--------+-------+--------+-------------------+ [id number]      0                                  +----------+--------+-------+--------+-------------------+ +---------+--------+--+--------+-+  VertebralPSV cm/s47EDV cm/s8 +---------+--------+--+--------+-+  Left Carotid Findings: +----------+--------+--------+--------+------------------+--------+           PSV cm/sEDV cm/sStenosisPlaque DescriptionComments +----------+--------+--------+--------+------------------+--------+ CCA Prox  59      14                                         +----------+--------+--------+--------+------------------+--------+ CCA Mid   69      17                                         +----------+--------+--------+--------+------------------+--------+ CCA Distal58      13                                         +----------+--------+--------+--------+------------------+--------+ ICA Prox  64      13              calcific                   +----------+--------+--------+--------+------------------+--------+ ICA Mid   71      19                                         +----------+--------+--------+--------+------------------+--------+ ICA Distal65      18                                         +----------+--------+--------+--------+------------------+--------+ ECA       64      9               calcific                   +----------+--------+--------+--------+------------------+--------+  +----------+--------+--------+--------+-------------------+           PSV cm/sEDV cm/sDescribeArm Pressure (mmHG) +----------+--------+--------+--------+-------------------+ [id number]      0                                   +----------+--------+--------+--------+-------------------+ +---------+--------+--+--------+--+ VertebralPSV cm/s50EDV cm/s15 +---------+--------+--+--------+--+  Summary: Right Carotid: Velocities in the right ICA are consistent with a 1-39% stenosis. Left Carotid: Velocities in the left ICA are consistent with a 1-39% stenosis. Vertebrals:  Bilateral vertebral arteries demonstrate antegrade flow. Subclavians: Normal flow hemodynamics were seen in  bilateral subclavian              arteries. *See table(s) above for measurements and observations.  Electronically signed by Leotis Pain MD on 02/22/2019 at 11:52:58 AM.    Final      Assessment/Plan Hypertension blood pressure control important in reducing the progression of atherosclerotic disease. On appropriate oral medications.   Hyperlipidemia lipid control important in reducing the progression of atherosclerotic disease.  Continue statin therapy   Carotid stenosis, left Carotid duplex today shows mild stenosis in the right carotid artery of less than 40% and a widely patent left carotid endarterectomy without significant recurrent stenosis. She is doing fairly well despite her extensive medical comorbidities. Continue current medical regimen. Recheck in 1 year.    Leotis Pain, MD  02/22/2019 12:48 PM    This note was created with Dragon medical transcription system.  Any errors from dictation are purely unintentional

## 2019-02-22 NOTE — Assessment & Plan Note (Signed)
Carotid duplex today shows mild stenosis in the right carotid artery of less than 40% and a widely patent left carotid endarterectomy without significant recurrent stenosis. She is doing fairly well despite her extensive medical comorbidities. Continue current medical regimen. Recheck in 1 year.

## 2019-02-22 NOTE — Assessment & Plan Note (Signed)
lipid control important in reducing the progression of atherosclerotic disease. Continue statin therapy  

## 2019-02-22 NOTE — Assessment & Plan Note (Signed)
blood pressure control important in reducing the progression of atherosclerotic disease. On appropriate oral medications.  

## 2019-07-26 DIAGNOSIS — F32A Depression, unspecified: Secondary | ICD-10-CM | POA: Insufficient documentation

## 2020-02-24 ENCOUNTER — Ambulatory Visit (INDEPENDENT_AMBULATORY_CARE_PROVIDER_SITE_OTHER): Payer: Medicare Other

## 2020-02-24 ENCOUNTER — Other Ambulatory Visit: Payer: Self-pay

## 2020-02-24 ENCOUNTER — Ambulatory Visit (INDEPENDENT_AMBULATORY_CARE_PROVIDER_SITE_OTHER): Payer: Medicare PPO | Admitting: Nurse Practitioner

## 2020-02-24 DIAGNOSIS — I6522 Occlusion and stenosis of left carotid artery: Secondary | ICD-10-CM

## 2020-03-02 ENCOUNTER — Encounter (INDEPENDENT_AMBULATORY_CARE_PROVIDER_SITE_OTHER): Payer: Self-pay | Admitting: *Deleted

## 2020-05-11 IMAGING — MR MR HEAD W/O CM
10 of 11 series · 42 of 48 positions shown · non-contrast
Comparison: 10/12/2017 CT head.

CLINICAL DATA: 85 y/o  F; left-sided weakness.

EXAM:
MRI HEAD WITHOUT CONTRAST
TECHNIQUE: Multiplanar, multiecho pulse sequences of the brain and surrounding
structures were obtained without intravenous contrast.

[Series 5: DWI · axial · 4.0mm · 0.88mm/px · z∈[-87,+43]mm · 8 of 74 slices shown (1 of 4)]
[im 1/74]
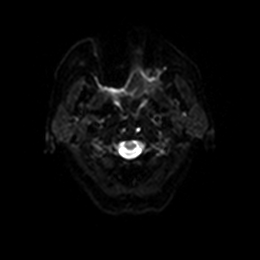
[im 11/74]
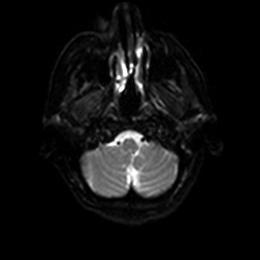
[im 21/74]
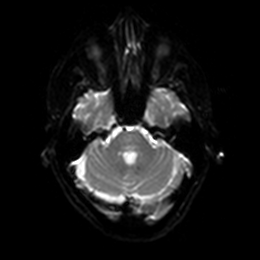
[im 32/74]
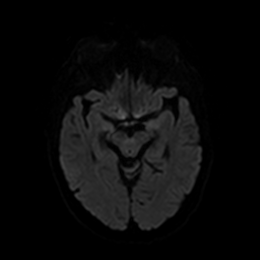
[im 42/74]
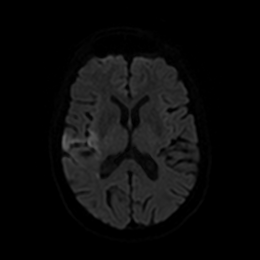
[im 53/74]
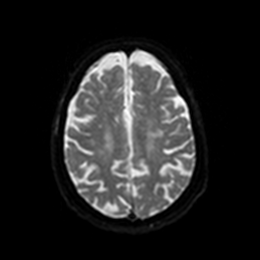
[im 63/74]
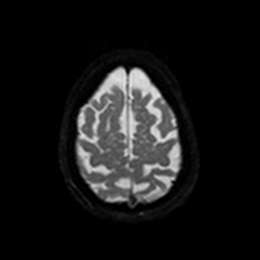
[im 74/74]
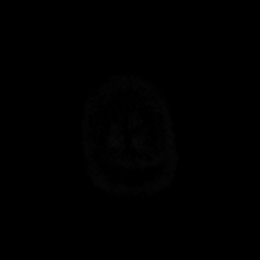

[Series 6: DWI · axial · 4.0mm · 0.88mm/px · z∈[-87,+43]mm · 4 of 37 slices shown (2 of 4)]
[im 1/37]
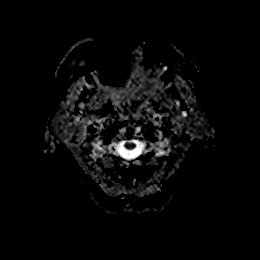
[im 13/37]
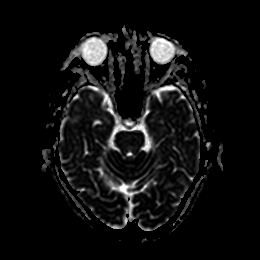
[im 25/37]
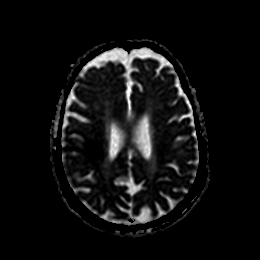
[im 37/37]
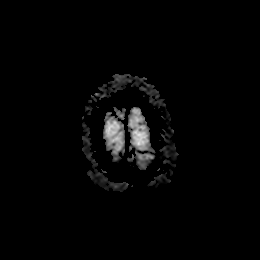

[Series 7: DWI · coronal · 4.0mm · 0.88mm/px · 7 of 68 slices shown (3 of 4)]
[im 1/68]
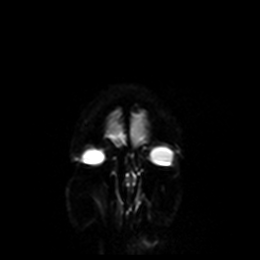
[im 12/68]
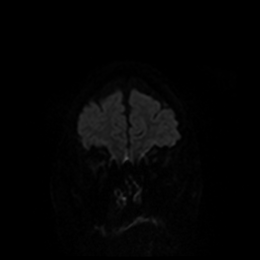
[im 23/68]
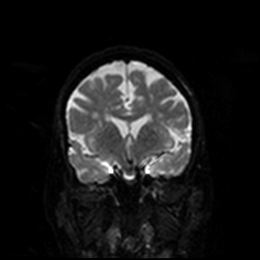
[im 34/68]
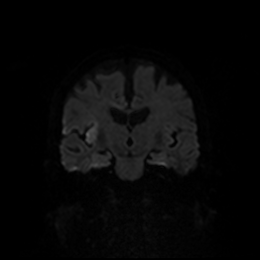
[im 45/68]
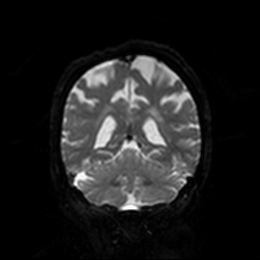
[im 56/68]
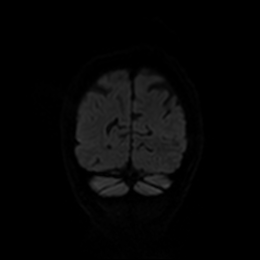
[im 68/68]
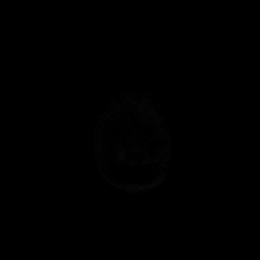

[Series 8: DWI · coronal · 4.0mm · 0.88mm/px · 3 of 34 slices shown (4 of 4)]
[im 1/34]
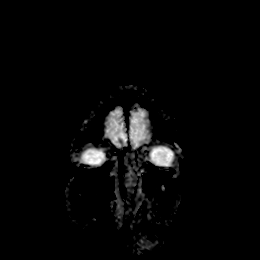
[im 17/34]
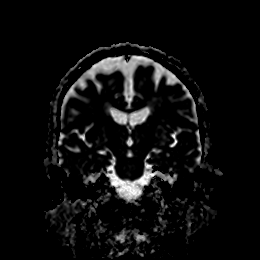
[im 34/34]
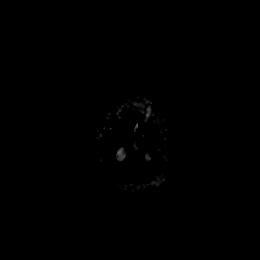

[Series 9: T1 · sagittal · 5.0mm · 0.75mm/px · 2 of 23 slices shown]
[im 1/23]
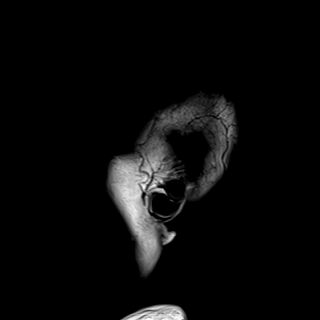
[im 23/23]
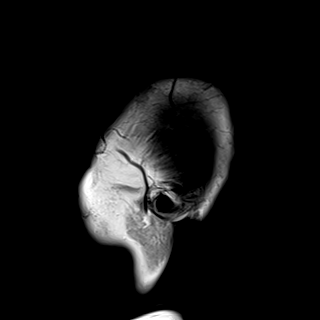

[Series 10: T2 · axial · 5.0mm · 0.72mm/px · z∈[-87,+43]mm · 2 of 25 slices shown (1 of 2)]
[im 1/25]
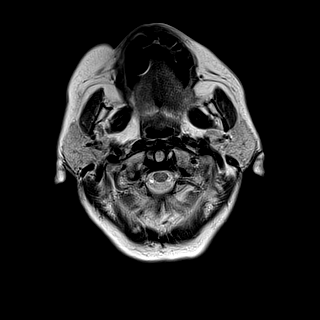
[im 25/25]
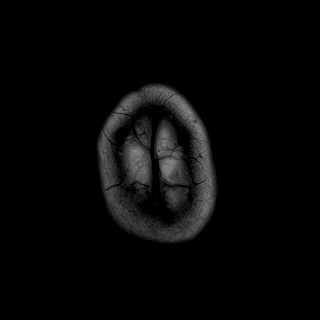

[Series 11: FLAIR · axial · 5.0mm · 0.45mm/px · z∈[-87,+43]mm · 2 of 25 slices shown]
[im 1/25]
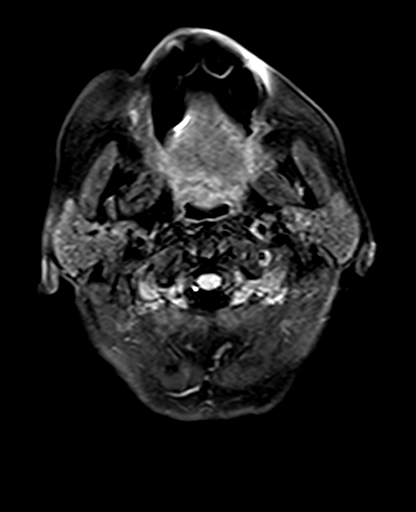
[im 25/25]
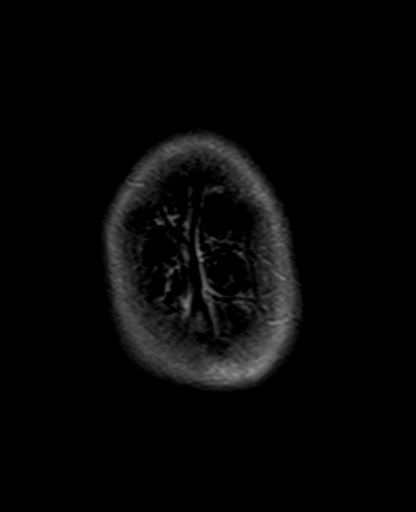

[Series 12: swi_images · axial · 3.0mm · 0.90mm/px · z∈[-94,+66]mm · 6 of 60 slices shown]
[im 1/60]
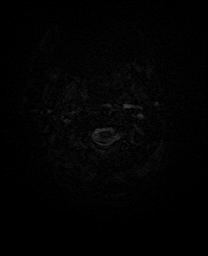
[im 12/60]
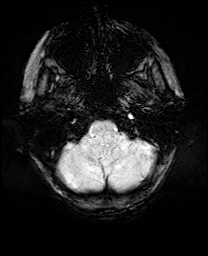
[im 24/60]
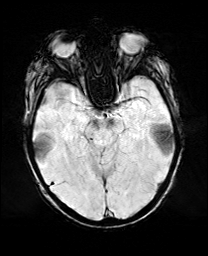
[im 36/60]
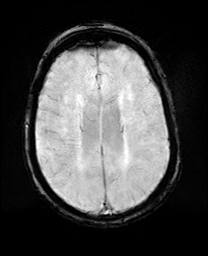
[im 48/60]
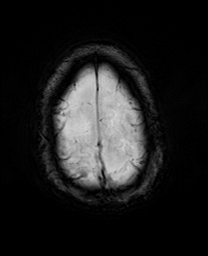
[im 60/60]
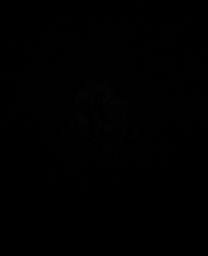

[Series 13: mip_images(sw) · axial · 24.0mm · 0.90mm/px · z∈[-84,+56]mm · 5 of 53 slices shown]
[im 1/53]
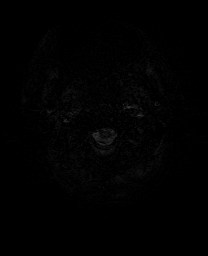
[im 14/53]
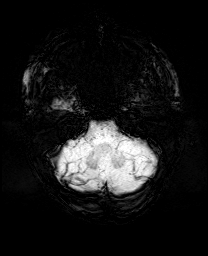
[im 27/53]
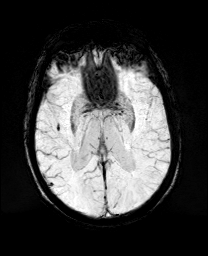
[im 40/53]
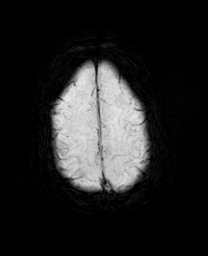
[im 53/53]
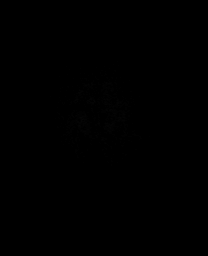

[Series 15: T2 · coronal · 5.0mm · 0.34mm/px · 3 of 29 slices shown (2 of 2)]
[im 1/29]
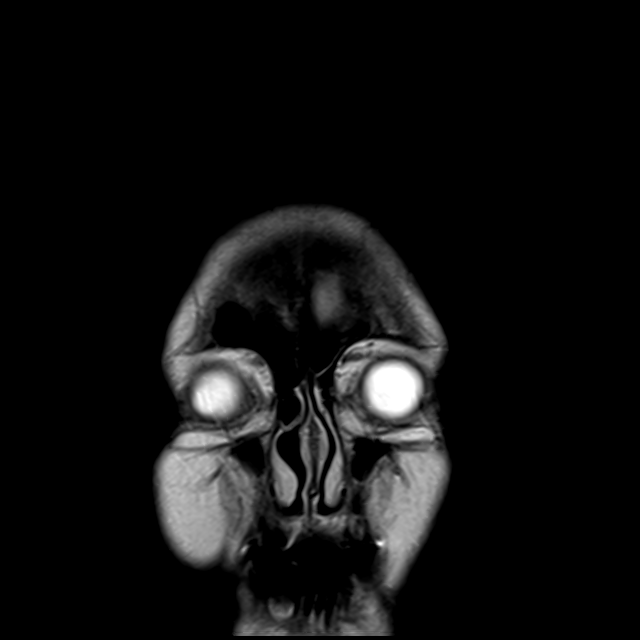
[im 15/29]
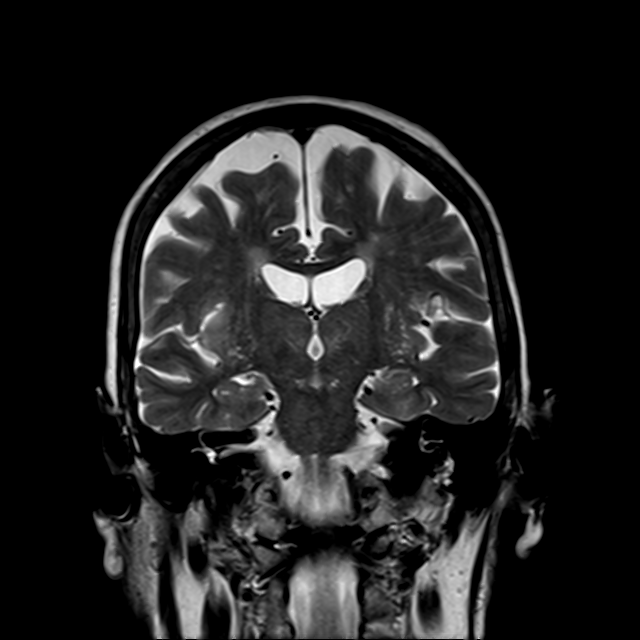
[im 29/29]
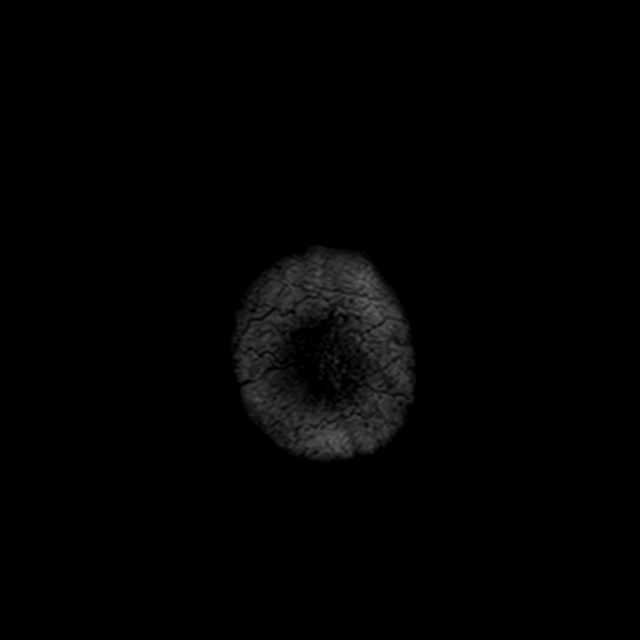

[42 of 48 positions shown; findings below may reference images not displayed]

FINDINGS: Brain: Reduced diffusion within the right posterior insula, frontal
operculum, and posterolateral frontal lobe compatible with
acute/early subacute infarction. No associated hemorrhage or mass
effect.

Several nonspecific T2 FLAIR hyperintensities in subcortical and
periventricular white matter are compatible with mild chronic
microvascular ischemic changes for age. Mild volume loss of the
brain.

Vascular: Linear focus of susceptibility blooming measuring 7 mm in
the right sylvian fissure, likely a vessel thrombus (series 10,
image 29).

Skull and upper cervical spine: Normal marrow signal.

Sinuses/Orbits: Negative.  Bilateral intra-ocular lens replacement.

Other: None.
IMPRESSION: 1. Acute/early subacute infarction involving the right posterior
insula, frontal operculum, and posterolateral frontal lobe. No
hemorrhage or mass effect.
2. Linear 7 mm focus of susceptibility hypointensity in right
sylvian fissure, likely a MCA branch vessel thrombus.
3. Mild for age chronic microvascular ischemic changes and volume
loss of the brain.

These results will be called to the ordering clinician or
representative by the Radiologist Assistant, and communication
documented in the PACS or zVision Dashboard.

By: Richkid Milien M.D.

## 2020-06-05 ENCOUNTER — Emergency Department
Admission: EM | Admit: 2020-06-05 | Discharge: 2020-06-05 | Disposition: A | Discharge status: Home / Self Care | Payer: Medicare Other | Attending: Emergency Medicine | Admitting: Emergency Medicine

## 2020-06-05 ENCOUNTER — Other Ambulatory Visit: Payer: Self-pay

## 2020-06-05 DIAGNOSIS — R55 Syncope and collapse: Secondary | ICD-10-CM | POA: Insufficient documentation

## 2020-06-05 DIAGNOSIS — Z79899 Other long term (current) drug therapy: Secondary | ICD-10-CM | POA: Diagnosis not present

## 2020-06-05 DIAGNOSIS — E039 Hypothyroidism, unspecified: Secondary | ICD-10-CM | POA: Diagnosis not present

## 2020-06-05 DIAGNOSIS — Z87891 Personal history of nicotine dependence: Secondary | ICD-10-CM | POA: Insufficient documentation

## 2020-06-05 DIAGNOSIS — Z7901 Long term (current) use of anticoagulants: Secondary | ICD-10-CM | POA: Insufficient documentation

## 2020-06-05 DIAGNOSIS — Z7982 Long term (current) use of aspirin: Secondary | ICD-10-CM | POA: Diagnosis not present

## 2020-06-05 DIAGNOSIS — I2510 Atherosclerotic heart disease of native coronary artery without angina pectoris: Secondary | ICD-10-CM | POA: Diagnosis not present

## 2020-06-05 DIAGNOSIS — I10 Essential (primary) hypertension: Secondary | ICD-10-CM | POA: Diagnosis not present

## 2020-06-05 LAB — CBC
HCT: 43.9 % (ref 36.0–46.0)
Hemoglobin: 13.6 g/dL (ref 12.0–15.0)
MCH: 29.7 pg (ref 26.0–34.0)
MCHC: 31 g/dL (ref 30.0–36.0)
MCV: 95.9 fL (ref 80.0–100.0)
Platelets: 205 10*3/uL (ref 150–400)
RBC: 4.58 MIL/uL (ref 3.87–5.11)
RDW: 12.2 % (ref 11.5–15.5)
WBC: 13.8 10*3/uL — ABNORMAL HIGH (ref 4.0–10.5)
nRBC: 0 % (ref 0.0–0.2)

## 2020-06-05 LAB — BASIC METABOLIC PANEL
Anion gap: 12 (ref 5–15)
BUN: 24 mg/dL — ABNORMAL HIGH (ref 8–23)
CO2: 20 mmol/L — ABNORMAL LOW (ref 22–32)
Calcium: 9.7 mg/dL (ref 8.9–10.3)
Chloride: 109 mmol/L (ref 98–111)
Creatinine, Ser: 0.84 mg/dL (ref 0.44–1.00)
GFR, Estimated: >60 mL/min (ref >60)
Glucose, Bld: 89 mg/dL (ref 70–99)
Potassium: 4.5 mmol/L (ref 3.5–5.1)
Sodium: 141 mmol/L (ref 135–145)

## 2020-06-05 LAB — TROPONIN I (HIGH SENSITIVITY): Troponin I (High Sensitivity): 4 ng/L (ref <18)

## 2020-06-05 MED ORDER — LACTATED RINGERS IV BOLUS
500.0000 mL | Freq: Once | INTRAVENOUS | Status: AC
  Administered 2020-06-05: 500 mL via INTRAVENOUS

## 2020-06-05 MED ORDER — ACETAMINOPHEN 325 MG PO TABS
650.0000 mg | ORAL_TABLET | Freq: Once | ORAL | Status: AC
  Administered 2020-06-05: 650 mg via ORAL
  Filled 2020-06-05: qty 2

## 2020-06-05 MED ORDER — GABAPENTIN 100 MG PO CAPS
200.0000 mg | ORAL_CAPSULE | Freq: Once | ORAL | Status: AC
  Administered 2020-06-05: 200 mg via ORAL
  Filled 2020-06-05: qty 2

## 2020-06-05 NOTE — ED Triage Notes (Signed)
Patient to ER via [facility] from home for c/o syncopal episode while on bedside toilet. Patient has left sided deficit from previous CVA. Was taken off BP medication 2 weeks ago due to vasovagal syncope while on toilet, son states s/s have gotten worse since being taken off medication. Vitals per EMS: 176CBG, 63HR, 100% RA, 23R, 37 CO2, 106/42. 20G IV present to R hand placed by EMS.

## 2020-06-05 NOTE — ED Notes (Signed)
Resumed care from ally rn.  Pt alert.  Pt sitting in recliner.  Iv in place.  nsr on monitor.

## 2020-06-05 NOTE — ED Triage Notes (Signed)
Pt comes via EMS from home with c/o LOC while on the bedside commode. EMS reports that pt was recently taken off BP meds due to vasovagal syncope two weeks prior.  Pt denies any pain or other complaints.   Pt has IV in right hand placed by EMS

## 2020-06-05 NOTE — ED Provider Notes (Signed)
[facility] Emergency Department Provider Note   ____________________________________________   Event Date/Time   First MD Initiated Contact with Patient 06/05/20 1309     (approximate)  I have reviewed the triage vital signs and the nursing notes.   HISTORY  Chief Complaint Loss of Consciousness    HPI Sonya [name] is a 87 y.o. female with past medical history of hypertension, hyperlipidemia, stroke, CAD, seizures, and DVT on Eliquis who presents to the ED following syncope.  Son states that patient has been dealing with intermittent syncopal episodes for the past couple of months.  She has been seen by both neurology and cardiology for this with no clear explanation.  Episodes always seem to occur while patient is on the commode.  She will go to have a bowel movement and loses consciousness, but does not fall off the commode.  These episodes have been occurring more frequently and she had another one earlier today.  She denies any associated chest pain or shortness of breath, is now feeling back to normal.        Past Medical History:  Diagnosis Date  . CAD (coronary artery disease)   . Carpal tunnel syndrome   . GERD (gastroesophageal reflux disease)   . History of shingles   . Hormone receptor positive breast cancer ([place]) 1960  . Hyperlipemia   . Hypertension   . Hypothyroidism   . Stroke [facility])    left sided weakness    Patient Active Problem List   Diagnosis Date Noted  . Coronary artery disease involving native coronary artery of native heart without angina pectoris 10/29/2018  . History of DVT (deep vein thrombosis) 10/29/2018  . Seizure ([place]) 06/25/2018  . Weakness   . Tremor   . Carotid stenosis, left 05/19/2018  . Carotid stenosis 04/25/2018  . Hypotension 04/10/2018  . Protein-calorie malnutrition, severe 03/30/2018  . Hypokalemia 03/26/2018  . Coffee ground emesis   . AVM (arteriovenous malformation) of stomach, acquired  with hemorrhage   . Upper GI bleed 03/22/2018  . Neuropathy 03/15/2018  . Pressure injury of skin 02/22/2018  . Decubitus ulcer of left ankle, unstageable ([place]) 02/22/2018  . Sepsis ([place]) 02/21/2018  . Tachycardia 12/09/2017  . Closed fracture of clavicle 11/19/2017  . Calculus of common duct without obstruction   . Abnormal findings on diagnostic imaging of liver   . Problems with swallowing and mastication   . Acute gastritis without hemorrhage   . Stricture and stenosis of esophagus   . AKI (acute kidney injury) ([place]) 11/05/2017  . Nausea & vomiting 11/05/2017  . Choledocholithiasis   . Dysphasia   . Abdominal pain 10/30/2017  . Cerebral embolism with cerebral infarction 10/13/2017  . CVA (cerebral vascular accident) ([place]) 10/13/2017  . Hypertension 10/12/2017  . Hypertensive urgency 10/12/2017  . Hypothyroidism 10/12/2017  . Left-sided weakness 10/12/2017  . Elevated troponin 10/12/2017  . Unknown when suspected stroke patient was last well 10/12/2017  . History of depression 12/05/2016  . Hypothyroidism due to acquired atrophy of thyroid 06/05/2014  . Chronic renal insufficiency 11/28/2013  . GERD (gastroesophageal reflux disease) 11/28/2013  . H/O vitamin D deficiency 11/28/2013  . History of ischemic heart disease 11/28/2013  . Hyperlipidemia 11/28/2013    Past Surgical History:  Procedure Laterality Date  . ABDOMINAL HYSTERECTOMY    . APPENDECTOMY    . CAROTID ANGIOGRAPHY Bilateral 03/31/2018   Procedure: CAROTID ANGIOGRAPHY;  Surgeon: Algernon Huxley, MD;  Location: [facility];  Service:  Cardiovascular;  Laterality: Bilateral;  . cornoary angioplasty    . ENDARTERECTOMY Left 05/19/2018   Procedure: ENDARTERECTOMY CAROTID;  Surgeon: Algernon Huxley, MD;  Location: [facility];  Service: Vascular;  Laterality: Left;  . ENDOSCOPIC RETROGRADE CHOLANGIOPANCREATOGRAPHY (ERCP) WITH PROPOFOL N/A 11/10/2017   Procedure: ENDOSCOPIC RETROGRADE CHOLANGIOPANCREATOGRAPHY (ERCP)  WITH PROPOFOL;  Surgeon: Lucilla Lame, MD;  Location: [facility] ENDOSCOPY;  Service: Endoscopy;  Laterality: N/A;  . ESOPHAGOGASTRODUODENOSCOPY N/A 03/23/2018   Procedure: ESOPHAGOGASTRODUODENOSCOPY (EGD);  Surgeon: Lin Landsman, MD;  Location: [facility] ENDOSCOPY;  Service: Gastroenterology;  Laterality: N/A;  . ESOPHAGOGASTRODUODENOSCOPY (EGD) WITH PROPOFOL N/A 11/06/2017   Procedure: ESOPHAGOGASTRODUODENOSCOPY (EGD) WITH PROPOFOL;  Surgeon: Lucilla Lame, MD;  Location: [facility] ENDOSCOPY;  Service: Endoscopy;  Laterality: N/A;  . PATCH ANGIOPLASTY Left 05/19/2018   Procedure: PATCH ANGIOPLASTY;  Surgeon: Algernon Huxley, MD;  Location: [facility];  Service: Vascular;  Laterality: Left;    Prior to Admission medications   Medication Sig Start Date End Date Taking? Authorizing Provider  acetaminophen (TYLENOL) 325 MG suppository Place 1 suppository (325 mg total) rectally every 4 (four) hours as needed for mild pain (or temp >/= 101 F). Patient not taking: Reported on 02/22/2019 05/20/18   Stegmayer, Janalyn Harder, PA-C  acetaminophen (TYLENOL) 325 MG tablet Take 1 tablet (325 mg total) by mouth every 4 (four) hours as needed for mild pain (or temp >/= 101 F). 05/20/18   Stegmayer, Janalyn Harder, PA-C  apixaban (ELIQUIS) 2.5 MG TABS tablet Take 1 tablet (2.5 mg total) by mouth 2 (two) times daily. 03/24/18   Epifanio Lesches, MD  aspirin EC 81 MG EC tablet Take 1 tablet (81 mg total) by mouth daily. Patient not taking: Reported on 06/26/2018 05/20/18   Stegmayer, Joelene Millin A, PA-C  B Complex-C (B-COMPLEX WITH VITAMIN C) tablet Take 1 tablet by mouth daily. Patient not taking: Reported on 06/26/2018 03/28/18   Nicholes Mango, MD  bisacodyl (DUCODYL) 5 MG EC tablet Take 2 tablets (10 mg total) by mouth daily. 02/24/18   Stark Jock Jude, MD  Cholecalciferol (VITAMIN D3) 1000 units CAPS Take 1,000 Units by mouth 2 (two) times a day.  09/07/17   [provider]  docusate sodium (COLACE) 100 MG capsule Take 100 mg by mouth 2 (two) times  daily.    [provider]  feeding supplement, ENSURE ENLIVE, (ENSURE ENLIVE) LIQD Take 237 mLs by mouth 2 (two) times daily between meals. Patient not taking: Reported on 08/18/2018 06/27/18   Lucila Maine C, DO  ferrous sulfate 325 (65 FE) MG tablet Take 1 tablet (325 mg total) by mouth 2 (two) times daily with a meal. Patient not taking: Reported on 06/26/2018 04/12/18   Gladstone Lighter, MD  hydrochlorothiazide (MICROZIDE) 12.5 MG capsule Take 12.5 mg by mouth daily.    [provider]  lansoprazole (PREVACID) 30 MG capsule Take 1 capsule (30 mg total) by mouth daily. 03/24/18 03/24/19  Epifanio Lesches, MD  levETIRAcetam (KEPPRA) 1000 MG tablet Take 1 tablet (1,000 mg total) by mouth 2 (two) times daily. Patient not taking: Reported on 02/22/2019 06/27/18   Steve Rattler, DO  levothyroxine (SYNTHROID, LEVOTHROID) 25 MCG tablet Take 25 mcg by mouth daily before breakfast.  09/10/17   [provider]  Melatonin 5 MG CAPS Take 5 mg by mouth at bedtime.     [provider]  metoprolol tartrate (LOPRESSOR) 25 MG tablet Take 1 tablet (25 mg total) by mouth 2 (two) times daily. 03/27/18   Nicholes Mango, MD  midodrine (PROAMATINE) 2.5 MG tablet Take 1 tablet (2.5 mg total) by mouth 2 (two) times daily with a meal. 04/12/18   Gladstone Lighter, MD  omeprazole (PRILOSEC) 20 MG capsule Take 20 mg by mouth 2 (two) times daily before a meal.    [provider]  ondansetron (ZOFRAN-ODT) 4 MG disintegrating tablet Take 1 tablet (4 mg total) by mouth every 8 (eight) hours as needed for nausea or vomiting. 02/24/18   Stark Jock Jude, MD  simvastatin (ZOCOR) 40 MG tablet Take 1 tablet (40 mg total) by mouth daily at 6 PM. 02/24/18   Stark Jock, Jude, MD  venlafaxine XR (EFFEXOR-XR) 150 MG 24 hr capsule Take 150 mg by mouth at bedtime. 09/10/17   [provider]    Allergies Clopidogrel and Omeprazole  Family History  Problem Relation Age of Onset  . Hypertension Son   .  Ovarian cancer Mother   . Stroke Father   . Stroke Son     Social History Social History   Tobacco Use  . Smoking status: Former Smoker    Packs/day: 0.50    Years: 20.00    Pack years: 10.00    Types: Cigarettes    Quit date: 02/17/1998    Years since quitting: 22.3  . Smokeless tobacco: Never Used  Vaping Use  . Vaping Use: Never used  Substance Use Topics  . Alcohol use: Not Currently  . Drug use: Never    Review of Systems  Constitutional: No fever/chills Eyes: No visual changes. ENT: No sore throat. Cardiovascular: Denies chest pain.  Positive for syncope. Respiratory: Denies shortness of breath. Gastrointestinal: No abdominal pain.  No nausea, no vomiting.  No diarrhea.  No constipation. Genitourinary: Negative for dysuria. Musculoskeletal: Negative for back pain. Skin: Negative for rash. Neurological: Negative for headaches, focal weakness or numbness.  ____________________________________________   PHYSICAL EXAM:  VITAL SIGNS: ED Triage Vitals  Enc Vitals Group     BP 06/05/20 1145 139/72     Pulse Rate 06/05/20 1145 65     Resp 06/05/20 1145 18     Temp 06/05/20 1145 98 F (36.7 C)     Temp src --      SpO2 06/05/20 1145 96 %     Weight 06/05/20 1148 112 lb (50.8 kg)     Height 06/05/20 1148 5' (1.524 m)     Head Circumference --      Peak Flow --      Pain Score 06/05/20 1148 0     Pain Loc --      Pain Edu? --      Excl. in [place]? --     Constitutional: Alert and oriented. Eyes: Conjunctivae are normal. Head: Atraumatic. Nose: No congestion/rhinnorhea. Mouth/Throat: Mucous membranes are moist. Neck: Normal ROM Cardiovascular: Normal rate, regular rhythm. Grossly normal heart sounds.  2+ radial pulses bilaterally. Respiratory: Normal respiratory effort.  No retractions. Lungs CTAB. Gastrointestinal: Soft and nontender. No distention. Genitourinary: deferred Musculoskeletal: No lower extremity tenderness nor edema. Neurologic:  Normal  speech and language.  Left hemiparesis, chronic per family with no acute neurologic deficits. Skin:  Skin is warm, dry and intact. No rash noted. Psychiatric: Mood and affect are normal. Speech and behavior are normal.  ____________________________________________   LABS (all labs ordered are listed, but only abnormal results are displayed)  Labs Reviewed  BASIC METABOLIC PANEL - Abnormal; Notable for the following components:      Result Value   CO2 20 (*)  BUN 24 (*)    All other components within normal limits  CBC - Abnormal; Notable for the following components:   WBC 13.8 (*)    All other components within normal limits  URINALYSIS, COMPLETE (UACMP) WITH MICROSCOPIC  CBG MONITORING, ED  TROPONIN I (HIGH SENSITIVITY)   ____________________________________________  EKG  ED ECG REPORT I, Blake Divine, the attending physician, personally viewed and interpreted this ECG.   Date: 06/05/2020  EKG Time: 12:02  Rate: 71  Rhythm: normal sinus rhythm  Axis: Normal  Intervals:none  ST&T Change: None   PROCEDURES  Procedure(s) performed (including Critical Care):  Procedures   ____________________________________________   INITIAL IMPRESSION / ASSESSMENT AND PLAN / ED COURSE       87 year old female with past medical history of hypertension, hyperlipidemia, CVA, CAD, seizure, and DVT on Eliquis who presents to the ED for recurrent syncopal episodes occurring over the past couple of months and increasing in frequency.  She denies any associated chest pain or shortness of breath, EKG shows no evidence of arrhythmia or ischemia.  We will add on troponin but low suspicion for cardiac cause of her syncope.  Symptoms sound vasovagal given they occur whenever she goes to have a bowel movement.  She had an echocardiogram in 2020 that was unremarkable and showed EF within normal limits.  We will observe on the cardiac monitor and hydrate with IV fluids, if remainder of work-up  is unremarkable patient will be appropriate for discharge home with cardiology follow-up.  No events noted on cardiac monitor, patient remains asymptomatic here in the ED.  Troponin is within normal limits and patient is appropriate for discharge home with cardiology follow-up.      ____________________________________________   FINAL CLINICAL IMPRESSION(S) / ED DIAGNOSES  Final diagnoses:  Syncope and collapse     ED Discharge Orders    None       Note:  This document was prepared using Dragon voice recognition software and may include unintentional dictation errors.   Blake Divine, MD 06/05/20 [phone]

## 2020-06-05 NOTE — ED Notes (Signed)
Pt  Arrived by EMS soiled with feces an urine. Pt has dark spots on her skin some wiped off as dirt however some did not. Pt. Is taken care of by son at home. Not sure if Social Worker should be invovled.

## 2021-02-25 ENCOUNTER — Other Ambulatory Visit (INDEPENDENT_AMBULATORY_CARE_PROVIDER_SITE_OTHER): Payer: Self-pay | Admitting: Vascular Surgery

## 2021-02-25 DIAGNOSIS — I6523 Occlusion and stenosis of bilateral carotid arteries: Secondary | ICD-10-CM

## 2021-02-26 ENCOUNTER — Ambulatory Visit (INDEPENDENT_AMBULATORY_CARE_PROVIDER_SITE_OTHER): Payer: Medicare Other | Admitting: Vascular Surgery

## 2021-02-26 ENCOUNTER — Other Ambulatory Visit: Payer: Self-pay

## 2021-02-26 ENCOUNTER — Encounter (INDEPENDENT_AMBULATORY_CARE_PROVIDER_SITE_OTHER): Payer: Self-pay | Admitting: Vascular Surgery

## 2021-02-26 ENCOUNTER — Ambulatory Visit (INDEPENDENT_AMBULATORY_CARE_PROVIDER_SITE_OTHER): Payer: Medicare Other

## 2021-02-26 VITALS — BP 166/98 | HR 103 | Resp 18 | Ht 60.0 in | Wt 112.0 lb

## 2021-02-26 DIAGNOSIS — I10 Essential (primary) hypertension: Secondary | ICD-10-CM

## 2021-02-26 DIAGNOSIS — E785 Hyperlipidemia, unspecified: Secondary | ICD-10-CM | POA: Diagnosis not present

## 2021-02-26 DIAGNOSIS — I6523 Occlusion and stenosis of bilateral carotid arteries: Secondary | ICD-10-CM

## 2021-02-26 DIAGNOSIS — I63511 Cerebral infarction due to unspecified occlusion or stenosis of right middle cerebral artery: Secondary | ICD-10-CM

## 2021-02-26 NOTE — Progress Notes (Signed)
MRN : [id number]  Sonya [name] is a 88 y.o. (1932/11/15) female who presents with chief complaint of  Chief Complaint  Patient presents with   Follow-up    1 yr carotid  .  History of Present Illness: Patient returns today in follow up of her carotid disease.  She is almost 3 years status post left carotid endarterectomy for high-grade stenosis.  She is brought in by her son today who provides much of the history.  She has had a stroke with left-sided hemiparesis at this point.  She has no motor or sensory function in the left arm at this point.  Her carotid duplex today reveals 1 to 39% right ICA stenosis and a patent left carotid endarterectomy without significant recurrent stenosis.  Current Outpatient Medications  Medication Sig Dispense Refill   acetaminophen (TYLENOL) 325 MG tablet Take 1 tablet (325 mg total) by mouth every 4 (four) hours as needed for mild pain (or temp >/= 101 F).     apixaban (ELIQUIS) 2.5 MG TABS tablet Take 1 tablet (2.5 mg total) by mouth 2 (two) times daily. 60 tablet 0   Cholecalciferol (VITAMIN D3) 1000 units CAPS Take 1,000 Units by mouth 2 (two) times a day.      citalopram (CELEXA) 20 MG tablet Take 20 mg by mouth daily.     docusate sodium (COLACE) 100 MG capsule Take 100 mg by mouth 2 (two) times daily.     gabapentin (NEURONTIN) 100 MG capsule Take by mouth.     lamoTRIgine (LAMICTAL) 100 MG tablet Take by mouth.     levothyroxine (SYNTHROID, LEVOTHROID) 25 MCG tablet Take 25 mcg by mouth daily before breakfast.      Melatonin 5 MG CAPS Take 5 mg by mouth at bedtime.     midodrine (PROAMATINE) 2.5 MG tablet Take 1 tablet (2.5 mg total) by mouth 2 (two) times daily with a meal. 60 tablet 1   ondansetron (ZOFRAN-ODT) 4 MG disintegrating tablet Take 1 tablet (4 mg total) by mouth every 8 (eight) hours as needed for nausea or vomiting. 20 tablet 0   simvastatin (ZOCOR) 40 MG tablet Take 1 tablet (40 mg total) by mouth daily at 6 PM. 30 tablet 0    venlafaxine XR (EFFEXOR-XR) 150 MG 24 hr capsule Take 150 mg by mouth at bedtime.     acetaminophen (TYLENOL) 325 MG suppository Place 1 suppository (325 mg total) rectally every 4 (four) hours as needed for mild pain (or temp >/= 101 F). (Patient not taking: Reported on 02/26/2021) 12 suppository 0   aspirin EC 81 MG EC tablet Take 1 tablet (81 mg total) by mouth daily. (Patient not taking: Reported on 06/26/2018)     B Complex-C (B-COMPLEX WITH VITAMIN C) tablet Take 1 tablet by mouth daily. (Patient not taking: Reported on 06/26/2018)     bisacodyl (DUCODYL) 5 MG EC tablet Take 2 tablets (10 mg total) by mouth daily. (Patient not taking: Reported on 02/26/2021) 30 tablet 0   feeding supplement, ENSURE ENLIVE, (ENSURE ENLIVE) LIQD Take 237 mLs by mouth 2 (two) times daily between meals. (Patient not taking: Reported on 08/18/2018) 237 mL 12   ferrous sulfate 325 (65 FE) MG tablet Take 1 tablet (325 mg total) by mouth 2 (two) times daily with a meal. (Patient not taking: Reported on 06/26/2018) 60 tablet 3   hydrochlorothiazide (MICROZIDE) 12.5 MG capsule Take 12.5 mg by mouth daily. (Patient not taking: Reported on 02/26/2021)     lansoprazole (  PREVACID) 30 MG capsule Take 1 capsule (30 mg total) by mouth daily. 30 capsule 1   levETIRAcetam (KEPPRA) 1000 MG tablet Take 1 tablet (1,000 mg total) by mouth 2 (two) times daily. (Patient not taking: Reported on 02/22/2019) 60 tablet 0   metoprolol tartrate (LOPRESSOR) 25 MG tablet Take 1 tablet (25 mg total) by mouth 2 (two) times daily. (Patient not taking: Reported on 02/26/2021) 60 tablet 0   omeprazole (PRILOSEC) 20 MG capsule Take 20 mg by mouth 2 (two) times daily before a meal. (Patient not taking: Reported on 02/26/2021)     No current facility-administered medications for this visit.    Past Medical History:  Diagnosis Date   CAD (coronary artery disease)    Carpal tunnel syndrome    GERD (gastroesophageal reflux disease)    History of shingles     Hormone receptor positive breast cancer ([place]) 1960   Hyperlipemia    Hypertension    Hypothyroidism    Stroke ([place])    left sided weakness    Past Surgical History:  Procedure Laterality Date   ABDOMINAL HYSTERECTOMY     APPENDECTOMY     CAROTID ANGIOGRAPHY Bilateral 03/31/2018   Procedure: CAROTID ANGIOGRAPHY;  Surgeon: Algernon Huxley, MD;  Location: [facility];  Service: Cardiovascular;  Laterality: Bilateral;   cornoary angioplasty     ENDARTERECTOMY Left 05/19/2018   Procedure: ENDARTERECTOMY CAROTID;  Surgeon: Algernon Huxley, MD;  Location: [facility] OR[facility];  Service: Vascular;  Laterality: Left;   ENDOSCOPIC RETROGRADE CHOLANGIOPANCREATOGRAPHY (ERCP) WITH PROPOFOL N/A 11/10/2017   Procedure: ENDOSCOPIC RETROGRADE CHOLANGIOPANCREATOGRAPHY (ERCP) WITH PROPOFOL;  Surgeon: Lucilla Lame, MD;  Location: [facility] ENDOSCOPY;  Service: Endoscopy;  Laterality: N/A;   ESOPHAGOGASTRODUODENOSCOPY N/A 03/23/2018   Procedure: ESOPHAGOGASTRODUODENOSCOPY (EGD);  Surgeon: Lin Landsman, MD;  Location: [facility];  Service: Gastroenterology;  Laterality: N/A;   ESOPHAGOGASTRODUODENOSCOPY (EGD) WITH PROPOFOL N/A 11/06/2017   Procedure: ESOPHAGOGASTRODUODENOSCOPY (EGD) WITH PROPOFOL;  Surgeon: Lucilla Lame, MD;  Location: [facility] ENDOSCOPY;  Service: Endoscopy;  Laterality: N/A;   PATCH ANGIOPLASTY Left 05/19/2018   Procedure: PATCH ANGIOPLASTY;  Surgeon: Algernon Huxley, MD;  Location: [facility] ORS;  Service: Vascular;  Laterality: Left;     Social History   Tobacco Use   Smoking status: Former    Packs/day: 0.50    Years: 20.00    Pack years: 10.00    Types: Cigarettes    Quit date: 02/17/1998    Years since quitting: 23.0   Smokeless tobacco: Never  Vaping Use   Vaping Use: Never used  Substance Use Topics   Alcohol use: Not Currently   Drug use: Never      Family History  Problem Relation Age of Onset   Hypertension Son    Ovarian cancer Mother    Stroke Father    Stroke Son       Allergies  Allergen Reactions   Clopidogrel Nausea And Vomiting     reported by [facility] 11/24/13   Omeprazole Other (See Comments)    Unknown reaction - reported by [facility] 11/24/13    REVIEW OF SYSTEMS (Negative unless checked)   Constitutional: [] Weight loss  [] Fever  [] Chills Cardiac: [] Chest pain   [] Chest pressure   [x] Palpitations   [] Shortness of breath when laying flat   [] Shortness of breath at rest   [] Shortness of breath with exertion. Vascular:  [] Pain in legs with walking   [] Pain in legs at rest   [] Pain in  legs when laying flat   [] Claudication   [] Pain in feet when walking  [] Pain in feet at rest  [] Pain in feet when laying flat   [] History of DVT   [] Phlebitis   [x] Swelling in legs   [] Varicose veins   [] Non-healing ulcers Pulmonary:   [] Uses home oxygen   [] Productive cough   [] Hemoptysis   [] Wheeze  [] COPD   [] Asthma Neurologic:  [] Dizziness  [] Blackouts   [] Seizures   [x] History of stroke   [] History of TIA  [] Aphasia   [] Temporary blindness   [] Dysphagia   [x] Weakness or numbness in arms   [x] Weakness or numbness in legs Musculoskeletal:  [x] Arthritis   [] Joint swelling   [x] Joint pain   [] Low back pain Hematologic:  [] Easy bruising  [] Easy bleeding   [] Hypercoagulable state   [] Anemic  [] Hepatitis Gastrointestinal:  [] Blood in stool   [] Vomiting blood  [] Gastroesophageal reflux/heartburn   [] Difficulty swallowing. Genitourinary:  [] Chronic kidney disease   [] Difficult urination  [] Frequent urination  [] Burning with urination   [] Blood in urine Skin:  [] Rashes   [] Ulcers   [] Wounds Psychological:  [] History of anxiety   []  History of major depression.  Physical Examination  BP (!) 166/98 (BP Location: Right Arm)    Pulse (!) 103    Resp 18    Ht 5' (1.524 m)    Wt 112 lb (50.8 kg)    BMI 21.87 kg/m  Gen:  thin, debilitated Head: [place]/AT, + temporalis wasting. Ear/Nose/Throat: Hearing grossly intact, nares w/o erythema or  drainage Eyes: Conjunctiva clear. Sclera non-icteric Neck: Supple.  Trachea midline Pulmonary:  Good air movement, no use of accessory muscles.  Cardiac: RRR, no JVD Vascular:  Vessel Right Left  Radial Palpable Palpable               Musculoskeletal: Left hemiparesis. No deformity or atrophy. Mild LE edema. Neurologic: Sensation grossly intact in extremities.  Left sided weakness. Speech is fluent.  Psychiatric: Judgment intact, Mood & affect appropriate for pt's clinical situation. Dermatologic: No rashes or ulcers noted.  No cellulitis or open wounds.    Labs No results found for this or any previous visit (from the past 2160 hour(s)).  Radiology No results found.  Assessment/Plan Hypertension blood pressure control important in reducing the progression of atherosclerotic disease. On appropriate oral medications.     Hyperlipidemia lipid control important in reducing the progression of atherosclerotic disease. Continue statin therapy  Carotid stenosis, left Carotid duplex today shows mild stenosis in the right carotid artery of less than 40% and a widely patent left carotid endarterectomy without significant recurrent stenosis. Continue current medical regimen. Recheck in 1 year.   Leotis Pain, MD  02/26/2021 2:18 PM    This note was created with Dragon medical transcription system.  Any errors from dictation are purely unintentional

## 2021-05-08 ENCOUNTER — Emergency Department: Payer: Medicare Other

## 2021-05-08 ENCOUNTER — Encounter: Payer: Self-pay | Admitting: Emergency Medicine

## 2021-05-08 ENCOUNTER — Inpatient Hospital Stay
Admission: EM | Admit: 2021-05-08 | Discharge: 2021-05-22 | DRG: 640 | Disposition: A | Discharge status: Skilled Nursing Facility | Payer: Medicare Other | Attending: Internal Medicine | Admitting: Internal Medicine

## 2021-05-08 ENCOUNTER — Other Ambulatory Visit: Payer: Self-pay

## 2021-05-08 DIAGNOSIS — I69354 Hemiplegia and hemiparesis following cerebral infarction affecting left non-dominant side: Secondary | ICD-10-CM

## 2021-05-08 DIAGNOSIS — Z5329 Procedure and treatment not carried out because of patient's decision for other reasons: Secondary | ICD-10-CM | POA: Diagnosis not present

## 2021-05-08 DIAGNOSIS — R5381 Other malaise: Secondary | ICD-10-CM | POA: Diagnosis present

## 2021-05-08 DIAGNOSIS — Z888 Allergy status to other drugs, medicaments and biological substances status: Secondary | ICD-10-CM

## 2021-05-08 DIAGNOSIS — F32A Depression, unspecified: Secondary | ICD-10-CM | POA: Diagnosis present

## 2021-05-08 DIAGNOSIS — R400 Somnolence: Secondary | ICD-10-CM

## 2021-05-08 DIAGNOSIS — I6340 Cerebral infarction due to embolism of unspecified cerebral artery: Secondary | ICD-10-CM | POA: Diagnosis present

## 2021-05-08 DIAGNOSIS — E876 Hypokalemia: Principal | ICD-10-CM | POA: Diagnosis present

## 2021-05-08 DIAGNOSIS — I639 Cerebral infarction, unspecified: Secondary | ICD-10-CM | POA: Diagnosis present

## 2021-05-08 DIAGNOSIS — I2510 Atherosclerotic heart disease of native coronary artery without angina pectoris: Secondary | ICD-10-CM | POA: Diagnosis present

## 2021-05-08 DIAGNOSIS — Z8249 Family history of ischemic heart disease and other diseases of the circulatory system: Secondary | ICD-10-CM

## 2021-05-08 DIAGNOSIS — E785 Hyperlipidemia, unspecified: Secondary | ICD-10-CM | POA: Diagnosis present

## 2021-05-08 DIAGNOSIS — Z853 Personal history of malignant neoplasm of breast: Secondary | ICD-10-CM

## 2021-05-08 DIAGNOSIS — F0150 Vascular dementia without behavioral disturbance: Secondary | ICD-10-CM | POA: Diagnosis present

## 2021-05-08 DIAGNOSIS — Z79899 Other long term (current) drug therapy: Secondary | ICD-10-CM

## 2021-05-08 DIAGNOSIS — Z90710 Acquired absence of both cervix and uterus: Secondary | ICD-10-CM

## 2021-05-08 DIAGNOSIS — Z87898 Personal history of other specified conditions: Secondary | ICD-10-CM

## 2021-05-08 DIAGNOSIS — Z23 Encounter for immunization: Secondary | ICD-10-CM

## 2021-05-08 DIAGNOSIS — I69318 Other symptoms and signs involving cognitive functions following cerebral infarction: Secondary | ICD-10-CM

## 2021-05-08 DIAGNOSIS — R569 Unspecified convulsions: Secondary | ICD-10-CM | POA: Diagnosis present

## 2021-05-08 DIAGNOSIS — I10 Essential (primary) hypertension: Secondary | ICD-10-CM | POA: Diagnosis present

## 2021-05-08 DIAGNOSIS — Z7982 Long term (current) use of aspirin: Secondary | ICD-10-CM

## 2021-05-08 DIAGNOSIS — L89024 Pressure ulcer of left elbow, stage 4: Secondary | ICD-10-CM | POA: Diagnosis present

## 2021-05-08 DIAGNOSIS — R410 Disorientation, unspecified: Secondary | ICD-10-CM

## 2021-05-08 DIAGNOSIS — I6529 Occlusion and stenosis of unspecified carotid artery: Secondary | ICD-10-CM | POA: Diagnosis present

## 2021-05-08 DIAGNOSIS — K31811 Angiodysplasia of stomach and duodenum with bleeding: Secondary | ICD-10-CM | POA: Diagnosis present

## 2021-05-08 DIAGNOSIS — Z79890 Hormone replacement therapy (postmenopausal): Secondary | ICD-10-CM

## 2021-05-08 DIAGNOSIS — Z681 Body mass index (BMI) 19 or less, adult: Secondary | ICD-10-CM

## 2021-05-08 DIAGNOSIS — L89322 Pressure ulcer of left buttock, stage 2: Secondary | ICD-10-CM | POA: Diagnosis present

## 2021-05-08 DIAGNOSIS — Z86718 Personal history of other venous thrombosis and embolism: Secondary | ICD-10-CM

## 2021-05-08 DIAGNOSIS — Z7401 Bed confinement status: Secondary | ICD-10-CM

## 2021-05-08 DIAGNOSIS — K219 Gastro-esophageal reflux disease without esophagitis: Secondary | ICD-10-CM | POA: Diagnosis present

## 2021-05-08 DIAGNOSIS — F419 Anxiety disorder, unspecified: Secondary | ICD-10-CM | POA: Diagnosis present

## 2021-05-08 DIAGNOSIS — T7691XA Unspecified adult maltreatment, suspected, initial encounter: Secondary | ICD-10-CM | POA: Diagnosis present

## 2021-05-08 DIAGNOSIS — Z7189 Other specified counseling: Secondary | ICD-10-CM

## 2021-05-08 DIAGNOSIS — R627 Adult failure to thrive: Secondary | ICD-10-CM | POA: Diagnosis present

## 2021-05-08 DIAGNOSIS — Z87891 Personal history of nicotine dependence: Secondary | ICD-10-CM

## 2021-05-08 DIAGNOSIS — E039 Hypothyroidism, unspecified: Secondary | ICD-10-CM | POA: Diagnosis present

## 2021-05-08 DIAGNOSIS — L8990 Pressure ulcer of unspecified site, unspecified stage: Secondary | ICD-10-CM | POA: Diagnosis present

## 2021-05-08 DIAGNOSIS — E43 Unspecified severe protein-calorie malnutrition: Secondary | ICD-10-CM | POA: Diagnosis present

## 2021-05-08 DIAGNOSIS — Z7901 Long term (current) use of anticoagulants: Secondary | ICD-10-CM

## 2021-05-08 DIAGNOSIS — Z9189 Other specified personal risk factors, not elsewhere classified: Secondary | ICD-10-CM

## 2021-05-08 LAB — URINALYSIS, ROUTINE W REFLEX MICROSCOPIC
Bacteria, UA: NONE SEEN
Bilirubin Urine: NEGATIVE
Glucose, UA: NEGATIVE mg/dL
Ketones, ur: NEGATIVE mg/dL
Leukocytes,Ua: NEGATIVE
Nitrite: NEGATIVE
Protein, ur: 30 mg/dL — AB
RBC / HPF: >50 RBC/hpf — ABNORMAL HIGH (ref 0–5)
Specific Gravity, Urine: 1.03 (ref 1.005–1.030)
pH: 5 (ref 5.0–8.0)

## 2021-05-08 LAB — CBC
HCT: 46.8 % — ABNORMAL HIGH (ref 36.0–46.0)
Hemoglobin: 13.6 g/dL (ref 12.0–15.0)
MCH: 28.9 pg (ref 26.0–34.0)
MCHC: 29.1 g/dL — ABNORMAL LOW (ref 30.0–36.0)
MCV: 99.4 fL (ref 80.0–100.0)
Platelets: 315 10*3/uL (ref 150–400)
RBC: 4.71 MIL/uL (ref 3.87–5.11)
RDW: 14.8 % (ref 11.5–15.5)
WBC: 13.1 10*3/uL — ABNORMAL HIGH (ref 4.0–10.5)
nRBC: 0 % (ref 0.0–0.2)

## 2021-05-08 LAB — HEPATIC FUNCTION PANEL
ALT: 14 U/L (ref 0–44)
AST: 21 U/L (ref 15–41)
Albumin: 3.3 g/dL — ABNORMAL LOW (ref 3.5–5.0)
Alkaline Phosphatase: 61 U/L (ref 38–126)
Bilirubin, Direct: 0.3 mg/dL — ABNORMAL HIGH (ref 0.0–0.2)
Indirect Bilirubin: 0.6 mg/dL (ref 0.3–0.9)
Total Bilirubin: 0.9 mg/dL (ref 0.3–1.2)
Total Protein: 7 g/dL (ref 6.5–8.1)

## 2021-05-08 LAB — PHOSPHORUS: Phosphorus: 2.8 mg/dL (ref 2.5–4.6)

## 2021-05-08 LAB — BASIC METABOLIC PANEL
Anion gap: 14 (ref 5–15)
BUN: 20 mg/dL (ref 8–23)
CO2: 30 mmol/L (ref 22–32)
Calcium: 10.4 mg/dL — ABNORMAL HIGH (ref 8.9–10.3)
Chloride: 95 mmol/L — ABNORMAL LOW (ref 98–111)
Creatinine, Ser: 0.86 mg/dL (ref 0.44–1.00)
GFR, Estimated: >60 mL/min (ref >60)
Glucose, Bld: 128 mg/dL — ABNORMAL HIGH (ref 70–99)
Potassium: 2.8 mmol/L — ABNORMAL LOW (ref 3.5–5.1)
Sodium: 139 mmol/L (ref 135–145)

## 2021-05-08 LAB — MAGNESIUM: Magnesium: 1.6 mg/dL — ABNORMAL LOW (ref 1.7–2.4)

## 2021-05-08 MED ORDER — METOPROLOL TARTRATE 25 MG PO TABS
25.0000 mg | ORAL_TABLET | Freq: Two times a day (BID) | ORAL | Status: AC
  Administered 2021-05-08: 25 mg via ORAL
  Filled 2021-05-08: qty 1

## 2021-05-08 MED ORDER — LAMOTRIGINE 100 MG PO TABS
100.0000 mg | ORAL_TABLET | Freq: Two times a day (BID) | ORAL | Status: DC
  Administered 2021-05-08 – 2021-05-22 (×15): 100 mg via ORAL
  Filled 2021-05-08 (×21): qty 1

## 2021-05-08 MED ORDER — LEVOTHYROXINE SODIUM 25 MCG PO TABS
25.0000 ug | ORAL_TABLET | Freq: Every day | ORAL | Status: DC
  Administered 2021-05-09 – 2021-05-16 (×8): 25 ug via ORAL
  Filled 2021-05-08 (×10): qty 1

## 2021-05-08 MED ORDER — ENSURE ENLIVE PO LIQD: 237.0000 mL | Freq: Two times a day (BID) | ORAL | Status: DC

## 2021-05-08 MED ORDER — SIMVASTATIN 20 MG PO TABS
40.0000 mg | ORAL_TABLET | Freq: Every day | ORAL | Status: DC
  Administered 2021-05-09 – 2021-05-17 (×5): 40 mg via ORAL
  Filled 2021-05-08: qty 4
  Filled 2021-05-08: qty 2
  Filled 2021-05-08 (×6): qty 4

## 2021-05-08 MED ORDER — MAGNESIUM SULFATE 2 GM/50ML IV SOLN
2.0000 g | Freq: Once | INTRAVENOUS | Status: AC
  Administered 2021-05-08: 2 g via INTRAVENOUS
  Filled 2021-05-08: qty 50

## 2021-05-08 MED ORDER — SODIUM CHLORIDE 0.9 % IV SOLN
INTRAVENOUS | Status: AC
Start: 2021-05-08 — End: 2021-05-09

## 2021-05-08 MED ORDER — ACETAMINOPHEN 325 MG PO TABS
650.0000 mg | ORAL_TABLET | Freq: Four times a day (QID) | ORAL | Status: AC | PRN
  Administered 2021-05-08 – 2021-05-11 (×4): 650 mg via ORAL
  Filled 2021-05-08 (×4): qty 2

## 2021-05-08 MED ORDER — MELATONIN 5 MG PO TABS
5.0000 mg | ORAL_TABLET | Freq: Every day | ORAL | Status: DC
  Administered 2021-05-08 – 2021-05-21 (×8): 5 mg via ORAL
  Filled 2021-05-08 (×10): qty 1

## 2021-05-08 MED ORDER — LEVETIRACETAM 500 MG PO TABS
500.0000 mg | ORAL_TABLET | Freq: Two times a day (BID) | ORAL | Status: DC
  Administered 2021-05-08 – 2021-05-09 (×3): 500 mg via ORAL
  Filled 2021-05-08 (×4): qty 1

## 2021-05-08 MED ORDER — APIXABAN 2.5 MG PO TABS
2.5000 mg | ORAL_TABLET | Freq: Two times a day (BID) | ORAL | Status: DC
  Administered 2021-05-08 – 2021-05-15 (×12): 2.5 mg via ORAL
  Filled 2021-05-08 (×17): qty 1

## 2021-05-08 MED ORDER — ONDANSETRON HCL 4 MG PO TABS: 4.0000 mg | ORAL_TABLET | Freq: Four times a day (QID) | ORAL | Status: DC | PRN

## 2021-05-08 MED ORDER — DOCUSATE SODIUM 100 MG PO CAPS
100.0000 mg | ORAL_CAPSULE | Freq: Two times a day (BID) | ORAL | Status: DC
  Administered 2021-05-08 – 2021-05-22 (×12): 100 mg via ORAL
  Filled 2021-05-08 (×19): qty 1

## 2021-05-08 MED ORDER — LABETALOL HCL 5 MG/ML IV SOLN
5.0000 mg | INTRAVENOUS | Status: AC | PRN
  Administered 2021-05-08 – 2021-05-11 (×4): 5 mg via INTRAVENOUS
  Filled 2021-05-08 (×3): qty 4

## 2021-05-08 MED ORDER — ACETAMINOPHEN 650 MG RE SUPP: 650.0000 mg | Freq: Four times a day (QID) | RECTAL | Status: AC | PRN

## 2021-05-08 MED ORDER — VITAMIN D3 25 MCG (1000 UNIT) PO TABS
1000.0000 [IU] | ORAL_TABLET | Freq: Every day | ORAL | Status: DC
  Administered 2021-05-09 – 2021-05-15 (×6): 1000 [IU] via ORAL
  Filled 2021-05-08 (×25): qty 1

## 2021-05-08 MED ORDER — SODIUM CHLORIDE 0.9 % IV BOLUS
500.0000 mL | Freq: Once | INTRAVENOUS | Status: AC
  Administered 2021-05-08: 500 mL via INTRAVENOUS

## 2021-05-08 MED ORDER — POTASSIUM CHLORIDE 10 MEQ/100ML IV SOLN
10.0000 meq | INTRAVENOUS | Status: AC
  Administered 2021-05-08 – 2021-05-09 (×5): 10 meq via INTRAVENOUS
  Filled 2021-05-08 (×5): qty 100

## 2021-05-08 MED ORDER — ONDANSETRON HCL 4 MG/2ML IJ SOLN: 4.0000 mg | Freq: Four times a day (QID) | INTRAMUSCULAR | Status: DC | PRN

## 2021-05-08 MED ORDER — POTASSIUM CHLORIDE 10 MEQ/100ML IV SOLN
10.0000 meq | INTRAVENOUS | Status: AC
  Administered 2021-05-08 (×2): 10 meq via INTRAVENOUS
  Filled 2021-05-08 (×2): qty 100

## 2021-05-08 MED ORDER — LORAZEPAM 2 MG/ML IJ SOLN
2.0000 mg | INTRAMUSCULAR | Status: DC | PRN
Start: 2021-05-08 — End: 2021-05-22
  Filled 2021-05-08 (×2): qty 1

## 2021-05-08 MED ORDER — LABETALOL HCL 5 MG/ML IV SOLN
5.0000 mg | INTRAVENOUS | Status: DC | PRN
  Filled 2021-05-08: qty 4

## 2021-05-08 NOTE — Assessment & Plan Note (Addendum)
-   Her outpatient clinic has contacted DSS and son has been asked to leave [place] clinic ?--DSS cleared son to visit pt . ?

## 2021-05-08 NOTE — ED Triage Notes (Signed)
Pt comes Into the ED via [facility] cardiology c/o urinary problems and possible placement.  PT currently lives at home and with her husband, but the patient's care has become out of his capabilities.  The sone also goes over to help when he can.  Patient presented to cardiology today with matted hair that has been that way since her last appt 6 months ago.  Pt told the clinic that she doesn't feel safe going back home.  Pt currently has a DSS worker involved in her care.  DSS worker is Steffanie Dunn and she can be reached at [phone]. ?

## 2021-05-08 NOTE — Assessment & Plan Note (Addendum)
--  supplement PRN with IV potassium since pt refused to take oral meds. ?
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
## 2021-05-08 NOTE — Assessment & Plan Note (Addendum)
-   Simvastatin 40 mg daily  ?

## 2021-05-08 NOTE — Hospital Course (Addendum)
[name] is a 88 year old female with history of CAD status post PCI in 1997, history of CVA with left-sided deficits, spastic hemiplegia of the left/nondominant side, multiple DVTs, currently on Eliquis, history of nontraumatic intraparenchymal hemorrhage of the brain, history of seizures, hypertension, hyperlipidemia, depression, anxiety, GERD, debility, who presents emergency department from [facility] clinic cardiology for chief concerns of urinary problems and placement. ? ?Initial vitals in the emergency department showed temperature of 97.9, respiration rate of 20, heart rate 93, blood pressure 175/82, increased to 190/99, SPO2 of 97% on room air. ? ?Serum sodium 139, potassium 2.8, chloride 95, bicarb 30, BUN of 20, serum creatinine of 0.86, nonfasting blood glucose 128, WBC 13.1, hemoglobin 13.6, platelets of 315. ? ?Magnesium was 1.6 on presentation ? ?UA was negative for leukocytes and nitrites.  Positive for hemoglobin. ? ?ED treatment: Magnesium 2 g IV, potassium chloride 10 mill equivalent x2, sodium chloride 1 L bolus. ? ?Pt was admitted for further work up and for placement. ? ?Pt has been declining for a while now prior to presentation, not eating enough and loosing weight.  Now pt hardly eats/drinks and refuses all oral meds.  Family confirmed no feeding tube.  Palliative care consult offered initially, but son declined due to "it making him feel bad." Patient with failure to thrive and family confirmed no feeding tube. ? ?Patient with intermittent worsening confusion, H/O prior stroke and vascular dementia. ?head CT neg for acute process ?-Family reports change in mentation and gradual decline ever since CVA diagnosis about 4 years ago. ?No leukocytosis. Doubt infectious process ?-discussed case with Neurology who recommends focus on establishing day/night cycle and pt was started on seroquel, which was later d/c'ed due to extreme somnolence ?-EEG unvealing,She will continue her home dose  of keppra and lamictal and follow up with her neurologist. ? ?Pt was initially prescribed midodrine to maintain her blood pressure to prevent CVA from carotid stenosis.  scheduled midodrine stopped secondary to elevated BP at presentation. ? ?She is being discharge to SNF with hospice care on board. ? ?She will continue current medications and follow up with her providers. ? ? ?

## 2021-05-08 NOTE — ED Notes (Signed)
Bladder scan is 24m ?

## 2021-05-08 NOTE — Assessment & Plan Note (Addendum)
-  spot EEG unrevealing ?-- cont home Lamictal 100 mg p.o. twice daily, ?--cont Keppra as IV since pt couldn't or wouldn't take most of her oral meds ?- Ativan 2 mg IV as needed for seizures ?

## 2021-05-08 NOTE — Assessment & Plan Note (Addendum)
-   Patient takes midodrine 2.5 mg p.o. twice daily prior to admit, however, BP has been elevated, and pt hasn't been refusing to take oral meds ?Plan: ?--IV labetalol 10 mg q6h ?

## 2021-05-08 NOTE — Assessment & Plan Note (Addendum)
-   labs reviewed. Mg corrected ?

## 2021-05-08 NOTE — Assessment & Plan Note (Addendum)
-   TSH normal at 3.359 ?- Synthroid 25 mcg daily  ?

## 2021-05-08 NOTE — ED Notes (Signed)
RN attempted to In and Out cath pt. RN unable to do so due to unable to get urine return. Per MD, bladder scan pt. ?
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
## 2021-05-09 DIAGNOSIS — E876 Hypokalemia: Secondary | ICD-10-CM | POA: Diagnosis not present

## 2021-05-09 DIAGNOSIS — K31811 Angiodysplasia of stomach and duodenum with bleeding: Secondary | ICD-10-CM

## 2021-05-09 LAB — CBC
HCT: 34.1 % — ABNORMAL LOW (ref 36.0–46.0)
Hemoglobin: 10.8 g/dL — ABNORMAL LOW (ref 12.0–15.0)
MCH: 29.1 pg (ref 26.0–34.0)
MCHC: 31.7 g/dL (ref 30.0–36.0)
MCV: 91.9 fL (ref 80.0–100.0)
Platelets: 278 10*3/uL (ref 150–400)
RBC: 3.71 MIL/uL — ABNORMAL LOW (ref 3.87–5.11)
RDW: 14.7 % (ref 11.5–15.5)
WBC: 12.1 10*3/uL — ABNORMAL HIGH (ref 4.0–10.5)
nRBC: 0 % (ref 0.0–0.2)

## 2021-05-09 LAB — BASIC METABOLIC PANEL
Anion gap: 6 (ref 5–15)
BUN: 18 mg/dL (ref 8–23)
CO2: 26 mmol/L (ref 22–32)
Calcium: 8.9 mg/dL (ref 8.9–10.3)
Chloride: 106 mmol/L (ref 98–111)
Creatinine, Ser: 0.76 mg/dL (ref 0.44–1.00)
GFR, Estimated: >60 mL/min (ref >60)
Glucose, Bld: 81 mg/dL (ref 70–99)
Potassium: 4.6 mmol/L (ref 3.5–5.1)
Sodium: 138 mmol/L (ref 135–145)

## 2021-05-09 LAB — MAGNESIUM: Magnesium: 2 mg/dL (ref 1.7–2.4)

## 2021-05-09 MED ORDER — ENSURE ENLIVE PO LIQD
237.0000 mL | Freq: Three times a day (TID) | ORAL | Status: DC
  Administered 2021-05-09 – 2021-05-19 (×11): 237 mL via ORAL

## 2021-05-09 MED ORDER — ADULT MULTIVITAMIN W/MINERALS CH
1.0000 | ORAL_TABLET | Freq: Every day | ORAL | Status: DC
  Administered 2021-05-09 – 2021-05-15 (×6): 1 via ORAL
  Filled 2021-05-09 (×11): qty 1

## 2021-05-09 MED ORDER — MIDODRINE HCL 5 MG PO TABS: 2.5000 mg | ORAL_TABLET | Freq: Three times a day (TID) | ORAL | Status: DC | PRN

## 2021-05-09 MED ORDER — ZINC SULFATE 220 (50 ZN) MG PO CAPS
220.0000 mg | ORAL_CAPSULE | Freq: Every day | ORAL | Status: DC
Start: 2021-05-09 — End: 2021-05-22
  Administered 2021-05-09 – 2021-05-15 (×6): 220 mg via ORAL
  Filled 2021-05-09 (×11): qty 1

## 2021-05-09 MED ORDER — ASCORBIC ACID 500 MG PO TABS
500.0000 mg | ORAL_TABLET | Freq: Two times a day (BID) | ORAL | Status: DC
  Administered 2021-05-09 – 2021-05-21 (×13): 500 mg via ORAL
  Filled 2021-05-09 (×20): qty 1

## 2021-05-09 MED ORDER — INFLUENZA VAC A&B SA ADJ QUAD 0.5 ML IM PRSY
0.5000 mL | PREFILLED_SYRINGE | INTRAMUSCULAR | Status: AC
  Administered 2021-05-10: 0.5 mL via INTRAMUSCULAR
  Filled 2021-05-09: qty 0.5

## 2021-05-09 NOTE — Evaluation (Addendum)
Clinical/Bedside Swallow Evaluation ?Patient Details  ?Name: Sonya [name] ?MRN: [id number] ?Date of Birth: 1932/12/01 ? ?Today's Date: 05/09/2021 ?Time: SLP Start Time (ACUTE ONLY): 0845 SLP Stop Time (ACUTE ONLY): 4656 ?SLP Time Calculation (min) (ACUTE ONLY): 20 min ? ?Past Medical History:  ?Past Medical History:  ?Diagnosis Date  ? CAD (coronary artery disease)   ? Carpal tunnel syndrome   ? GERD (gastroesophageal reflux disease)   ? History of shingles   ? Hormone receptor positive breast cancer ([place]) 1960  ? Hyperlipemia   ? Hypertension   ? Hypothyroidism   ? Stroke [facility])   ? left sided weakness  ? ?Past Surgical History:  ?Past Surgical History:  ?Procedure Laterality Date  ? ABDOMINAL HYSTERECTOMY    ? APPENDECTOMY    ? CAROTID ANGIOGRAPHY Bilateral 03/31/2018  ? Procedure: CAROTID ANGIOGRAPHY;  Surgeon: Algernon Huxley, MD;  Location: [place] [facility];  Service: Cardiovascular;  Laterality: Bilateral;  ? cornoary angioplasty    ? ENDARTERECTOMY Left 05/19/2018  ? Procedure: ENDARTERECTOMY CAROTID;  Surgeon: Algernon Huxley, MD;  Location: [facility] ORS;  Service: Vascular;  Laterality: Left;  ? ENDOSCOPIC RETROGRADE CHOLANGIOPANCREATOGRAPHY (ERCP) WITH PROPOFOL N/A 11/10/2017  ? Procedure: ENDOSCOPIC RETROGRADE CHOLANGIOPANCREATOGRAPHY (ERCP) WITH PROPOFOL;  Surgeon: Lucilla Lame, MD;  Location: [facility] ENDOSCOPY;  Service: Endoscopy;  Laterality: N/A;  ? ESOPHAGOGASTRODUODENOSCOPY N/A 03/23/2018  ? Procedure: ESOPHAGOGASTRODUODENOSCOPY (EGD);  Surgeon: Lin Landsman, MD;  Location: [facility] ENDOSCOPY;  Service: Gastroenterology;  Laterality: N/A;  ? ESOPHAGOGASTRODUODENOSCOPY (EGD) WITH PROPOFOL N/A 11/06/2017  ? Procedure: ESOPHAGOGASTRODUODENOSCOPY (EGD) WITH PROPOFOL;  Surgeon: Lucilla Lame, MD;  Location: [facility] ENDOSCOPY;  Service: Endoscopy;  Laterality: N/A;  ? PATCH ANGIOPLASTY Left 05/19/2018  ? Procedure: PATCH ANGIOPLASTY;  Surgeon: Algernon Huxley, MD;  Location: [facility] ORS;  Service: Vascular;  Laterality: Left;   ? ?HPI:  ?Per H&P "[name] is a 88 year old female with history of CAD status post PCI in 1997, history of CVA with left-sided deficits, spastic hemiplegia of the left/nondominant side, multiple DVTs, currently on Eliquis, history of nontraumatic intraparenchymal hemorrhage of the brain, history of seizures, hypertension, hyperlipidemia, depression, anxiety, GERD, debility, who presents emergency department from [facility] clinic cardiology for chief concerns of urinary problems and placement.     Initial vitals in the emergency department showed temperature of 97.9, respiration rate of 20, heart rate 93, blood pressure 175/82, increased to 190/99, SPO2 of 97% on room air.    Serum sodium 139, potassium 2.8, chloride 95, bicarb 30, BUN of 20, serum creatinine of 0.86, nonfasting blood glucose 128, WBC 13.1, hemoglobin 13.6, platelets of 315.     Magnesium was 1.6.     UA was negative for leukocytes and nitrites.  Positive for hemoglobin.     ED treatment: Magnesium 2 g IV, potassium chloride 10 mill equivalent x2, sodium chloride 1 L bolus.     At bedside she is able to tell me her name, her current location of hospital, and the current month.  She has difficulty with here as she states she has been bedbound since 2019.  She states that she is in her 34s, 42 or 4.     She endorses no acute complaints.  She states that she has a hemiplegia and neuropathy pain.  She states when she has the nerve pain, she shakes all over and she is aware that she is having the pain and she is talking at the same time.  She endorses compliance with her seizure medication.  Her grandson Sonya [name] is at bedside with her permission.     Her grandson at bedside states the patient has been having poor p.o. intake in the last several weeks.  He states that she has had difficulty swallowing though she denies this.  He states that he has had to cut up her food into small chunks of and or make the food really soft for her to swallow.   She adamantly denies all of these stating that she can eat just fine.     She endorses a chronic cough with white phlegm.  She denies fever, chest pain, shortness of breath, abdominal pain, dysuria, hematuria, diarrhea, weakness, nausea, vomiting, weight changes.     Social history: She is a widow and lives with her son. She has grandson help occasionally. She is a former tobacco user and quit a long time ago. She denies etoh and recreational drug use. 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  ?Assessment / Plan / Recommendation  ?Clinical Impression ? Pt seen for clinical swallowing evaluation. Pt alert, pleasantly confused. Denies dysphagia; however, ?if pt is a reliable historian. Pt often closed eyes despite verbal cues/encouragement to keep open to help promote self-feeding.  ? ?Per chart review, pt with hx of oral dysphagia. Pt last seen by SLP, 06/26/18, with recommendation for a pureed diet with thin liquids. No family present to provide interval hx. Noted in H&P "Her grandson at bedside states the patient has been having poor p.o. intake in the last several weeks.  He states that she has had difficulty swallowing though she denies this.  He states that he has had to cut up her food into small chunks of and or make the food really soft for her to swallow.  She adamantly denies all of these stating that she can eat just fine." ? ?Per chart review, WBC elevated (but trending downward from yesterday) and temp WNL. No chest imaging this hospitalization. ? ?Oral motor examination notable for mild flattening of L nasolabial fold, mildly reduced lingual strength on L, +upper denture (donned for evaluation), sparse lower dentition, xerostomia (dried, peeling lips; dry lingual body coated in debris), and poor oral health/dental hygiene. Oral care provided accordingly.  ? ?Pt given trials of solid, pureed, and thin liquids (via straw). Pt with inconstent ability to  feed self despite verbal/tactile cueing and assistance. Pt presents with s/sx moderate oral dyspahgia including: inconsistent oral holding and delayed oral transit with puree which improved across trials and prolonged/inefficient mastication and oral residual with soft solid. Oral residual cleared with liquid wash. Oral deficits likely due to dental status as well as lingual weakness/incoordination and likely exacerbated by mental status.  ? ?Recommend initiation of a pureed diet with thin liquids and safe swallowing strategies/aspiration precautions as outlined below. Pt will also need assistance with oral care/denture care. ? ?Pt is at an increased risk for aspiration/aspiration PNA given dental status, mental status, medical comorbidites (including stroke, GERD), and need for assistance with feeding at present.  ? ?SLP to f/u per POC for diet tolerance and trials of upgraded textures as appropriate.  ? ?Pt and RN made aware of results, recommendations, and SLP POC. Pt verbalized understanding/agreement. Suspect need for reinforcement of content by medical team given pt's mental status.  ? ?SLP Visit Diagnosis: Dysphagia, oral phase (R13.11) ?   ?Aspiration Risk ? Mild aspiration risk  ?  ?Diet Recommendation Dysphagia 1 (Puree);Thin liquid  ? ?Medication  Administration: Crushed with puree (vs whole with puree) ?Supervision: Staff to assist with self feeding;Full supervision/cueing for compensatory strategies ?Compensations: Minimize environmental distractions;Slow rate;Small sips/bites;Follow solids with liquid (check mouth for oral clearance) ?Postural Changes: Seated upright at 90 degrees;Remain upright for at least 30 minutes after po intake  ?  ?Other  Recommendations Oral Care Recommendations: Oral care QID;Oral care before and after PO;Staff/trained caregiver to provide oral care   ? ?Recommendations for follow up therapy are one component of a multi-disciplinary discharge planning process, led by the  attending physician.  Recommendations may be updated based on patient status, additional functional criteria and insurance authorization. ? ?Follow up Recommendations Skilled nursing-short term rehab (<3 ho
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
## 2021-05-09 NOTE — Progress Notes (Signed)
Patient complained of pain 4/10. Pain medication given. See MAR ?

## 2021-05-09 NOTE — Evaluation (Signed)
Physical Therapy Evaluation Patient Details Name: Sonya [name] MRN: [id number] DOB: 1932-10-04 Today's Date: 05/09/2021  History of Present Illness  Patient is a 88 year old female with history of CAD status post PCI, CVA with left-sided deficits, spastic hemiplegia of the left/nondominant side, multiple DVTs, currently on Eliquis, history of nontraumatic intraparenchymal hemorrhage of the brain, seizures, hypertension, hyperlipidemia, depression, anxiety, GERD, debility, who presents emergency department from [facility] clinic cardiology for chief concerns of urinary problems and placement.   Clinical Impression  The patient is cooperative and agreeable to PT evaluation. The patient has some confusion but is able to follow single step commands with extra time. She has spastic hemiplegia at baseline and is primarily in the bed at home (non-ambulatory at baseline) unless her family picks her up to the bed side commode or wheelchair (for doctor appointments). She is able to feed herself at baseline with set-up using RUE. Prior level of function information is per the grandson in the room as patient is a poor historian (she reports she can walk).  The patient has pain with gentle ROM of LUE. She has hemiplegia on the left but significant overall weakness and deconditioning as well. The patient required total assistance for bed mobility and assistance to maintain sitting balance on the side of the bed. No dizziness was reported while sitting upright. Standing not attempted this session. Recommend to continue PT in attempts to maximize independence and decrease caregiver burden. Recommend SNF at discharge.      Recommendations for follow up therapy are one component of a multi-disciplinary discharge planning process, led by the attending physician.  Recommendations may be updated based on patient status, additional functional criteria and insurance authorization.  Follow Up Recommendations Skilled  nursing-short term rehab (<3 hours/day)    Assistance Recommended at Discharge Frequent or constant Supervision/Assistance  Patient can return home with the following  Two people to help with walking and/or transfers;Two people to help with bathing/dressing/bathroom;Assistance with cooking/housework;Direct supervision/assist for financial management;Direct supervision/assist for medications management;Assist for transportation;Help with stairs or ramp for entrance    Equipment Recommendations None recommended by PT  Recommendations for Other Services       Functional Status Assessment Patient has had a recent decline in their functional status and demonstrates the ability to make significant improvements in function in a reasonable and predictable amount of time.     Precautions / Restrictions Precautions Precautions: Fall Restrictions Weight Bearing Restrictions: No      Mobility  Bed Mobility Overal bed mobility: Needs Assistance Bed Mobility: Supine to Sit, Sit to Supine     Supine to sit: Total assist Sit to supine: Total assist   General bed mobility comments: assistance for trunk and BLE support. verbal cues for technique    Transfers                   General transfer comment: not attempted due to poor sitting balance and decreased activity tolerance. recommend to use a lift for routine out of bed activity or 2 person assistance would likely be required    Ambulation/Gait                  Stairs            Wheelchair Mobility    Modified Rankin (Stroke Patients Only)       Balance Overall balance assessment: Needs assistance, History of Falls Sitting-balance support: Feet unsupported, No upper extremity supported Sitting balance-Leahy Scale: Zero Sitting balance - Comments: maximal assistance  to maintain midline sitting balance with hard posterior lean in sitting. patient has minimal protective righting reactions noted in sitting. no  dizziness is reported with upright activity and heart rate in the 60's. (of note, the grandson reports she has intermittent episodes of blacking out while sitting upright in the past). total sitting time less than 5 minutes Postural control: Posterior lean                                   Pertinent Vitals/Pain Pain Assessment Pain Assessment: Faces Faces Pain Scale: Hurts a little bit Pain Location: left elbow Pain Descriptors / Indicators: Discomfort (chronic pain) Pain Intervention(s): Limited activity within patient's tolerance, Repositioned    Home Living Family/patient expects to be discharged to:: Private residence Living Arrangements: Children (son) Available Help at Discharge: Family   Home Access: Ramped entrance         Home Equipment: Hospital bed;Wheelchair - manual;BSC/3in1 (high back wheelchair)      Prior Function Prior Level of Function : History of Falls (last six months);Needs assist       Physical Assist : Mobility (physical);ADLs (physical) Mobility (physical): Bed mobility;Transfers ADLs (physical): Grooming;Bathing;Dressing;Toileting;IADLs Mobility Comments: grandson reports patient has essentially been bed bound for ~ 3 years. She spends most of her time in the bed and family picks her up to the [facility] or wheelchair ADLs Comments: the patient is able to feed herself with set-up per the grandson. otherwise, she needs assistance with all ADLs     Hand Dominance   Dominant Hand: Right    Extremity/Trunk Assessment   Upper Extremity Assessment Upper Extremity Assessment: LUE deficits/detail;RUE deficits/detail RUE Deficits / Details: patient has some active movement in RUE, however has generalized weakness throughout. LUE Deficits / Details: spastic hemiplegia at baseline with pain with PROM attempts. patient hold arm in flexed position and has pain with any movement.    Lower Extremity Assessment Lower Extremity Assessment: RLE  deficits/detail;LLE deficits/detail RLE Deficits / Details: patient has minimal active movement noted. stiffness with PROM attempts RLE Sensation: history of peripheral neuropathy LLE Deficits / Details: no active movement noted. history of spastic hemiplegia at baseline and is basically bed bound at home LLE Sensation: history of peripheral neuropathy       Communication   Communication: No difficulties  Cognition Arousal/Alertness: Awake/alert Behavior During Therapy: WFL for tasks assessed/performed   Area of Impairment: Memory, Following commands                     Memory: Decreased short-term memory Following Commands: Follows one step commands with increased time       General Comments: the patient has some confusion (reports she can walk but grandson confirms she is non ambulatory). she is oriented to person, place, and is able to follow single step commands with extra time. she has difficulty with multi-step commands        General Comments      Exercises     Assessment/Plan    PT Assessment Patient needs continued PT services  PT Problem List Decreased strength;Decreased range of motion;Decreased activity tolerance;Decreased balance;Decreased mobility;Decreased cognition;Decreased safety awareness;Decreased knowledge of use of DME;Impaired tone;Impaired sensation       PT Treatment Interventions DME instruction;Functional mobility training;Therapeutic activities;Therapeutic exercise;Balance training;Neuromuscular re-education;Cognitive remediation;Patient/family education;Wheelchair mobility training    PT Goals (Current goals can be found in the Care Plan section)  Acute Rehab PT Goals  Patient Stated Goal: family goal is to get to rehab PT Goal Formulation: With family Time For Goal Achievement: 05/23/21 Potential to Achieve Goals: Fair    Frequency Min 2X/week     Co-evaluation               AM-PAC PT "6 Clicks" Mobility  Outcome  Measure Help needed turning from your back to your side while in a flat bed without using bedrails?: Total Help needed moving from lying on your back to sitting on the side of a flat bed without using bedrails?: Total Help needed moving to and from a bed to a chair (including a wheelchair)?: Total Help needed standing up from a chair using your arms (e.g., wheelchair or bedside chair)?: Total Help needed to walk in hospital room?: Total Help needed climbing 3-5 steps with a railing? : Total 6 Click Score: 6    End of Session   Activity Tolerance: Patient tolerated treatment well;Patient limited by fatigue Patient left: in bed;with call bell/phone within reach;with bed alarm set;with family/visitor present (grandson in the room) Nurse Communication: Mobility status PT Visit Diagnosis: Muscle weakness (generalized) (M62.81);Unsteadiness on feet (R26.81);Other abnormalities of gait and mobility (R26.89)    Time: 4696-2952 PT Time Calculation (min) (ACUTE ONLY): 25 min   Charges:   PT Evaluation $PT Eval Low Complexity: 1 Low PT Treatments $Therapeutic Exercise: 8-22 mins        Donna Bernard, PT, MPT   Ina [employer] 05/09/2021, 9:58 AM
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
## 2021-05-09 NOTE — Progress Notes (Signed)
Patient with severely matted hair, causing pain and irritation to scalp. Attempted to soak hair with conditioner and wrap in wet towel, but this did not break apart any of the matting. With grandson at bedside and at his request, matting was cut from patient's hair. Scalp and hair washed, but will need multiple washes. Patient resting comfortably after, and at the moment.  ?

## 2021-05-09 NOTE — Progress Notes (Signed)
Cardiac monitoring reordered ?
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
## 2021-05-09 NOTE — Assessment & Plan Note (Addendum)
-  No new focal neurologic changes  ?

## 2021-05-09 NOTE — Consult Note (Addendum)
WOC Nurse Consult Note: ?Reason for Consult: Consult requested for left elbow. Pt is familiar to [facility] team from previous admission. ?Wound type: Left elbow with chronic Stage 4 pressure injuru; 1.5X1.5X.3cm, red and moist, small amt yellow drainage.  It is difficult to promote wound healing to the elbow because the skin is constantly moving over the joint and bone.  ?Pressure Injury POA: Yes ?Dressing procedure/placement/frequency: Topical treatment orders provided for bedside nurses to perform as follows to absorb drainage and provide antimicrobial benefits: Cut small piece of Aquacel Kellie Simmering # [phone]) and place into left elbow wound Q day, then cover with foam dressing.  (Change foam dressing Q 3 days or PRN soiling.) Moisten previous dressing with saline each time to remove. ?Please re-consult if further assistance is needed.  Thank-you,  ?Julien Girt MSN, RN, [place], [place], CNS ?[phone]  ? ?  ?

## 2021-05-09 NOTE — Plan of Care (Signed)
°  Problem: Coping: °Goal: Level of anxiety will decrease °Outcome: Progressing °  °

## 2021-05-09 NOTE — Assessment & Plan Note (Addendum)
-   Pt was initially prescribed midodrine to maintain her blood pressure to prevent CVA from carotid stenosis.  scheduled midodrine stopped secondary to elevated BP at presentation ? ?
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
## 2021-05-10 DIAGNOSIS — E876 Hypokalemia: Secondary | ICD-10-CM | POA: Diagnosis not present

## 2021-05-10 DIAGNOSIS — I2510 Atherosclerotic heart disease of native coronary artery without angina pectoris: Secondary | ICD-10-CM

## 2021-05-10 DIAGNOSIS — R410 Disorientation, unspecified: Secondary | ICD-10-CM

## 2021-05-10 MED ORDER — QUETIAPINE FUMARATE 25 MG PO TABS
25.0000 mg | ORAL_TABLET | Freq: Every day | ORAL | Status: DC
  Administered 2021-05-11 – 2021-05-12 (×2): 25 mg via ORAL
  Filled 2021-05-10 (×3): qty 1

## 2021-05-10 MED ORDER — LEVETIRACETAM IN NACL 500 MG/100ML IV SOLN
500.0000 mg | Freq: Two times a day (BID) | INTRAVENOUS | Status: DC
  Administered 2021-05-10 – 2021-05-12 (×5): 500 mg via INTRAVENOUS
  Filled 2021-05-10 (×6): qty 100

## 2021-05-10 MED ORDER — KETOROLAC TROMETHAMINE 15 MG/ML IJ SOLN
15.0000 mg | Freq: Once | INTRAMUSCULAR | Status: AC
  Administered 2021-05-10: 15 mg via INTRAVENOUS
  Filled 2021-05-10: qty 1

## 2021-05-10 NOTE — Progress Notes (Signed)
Wound dressing previously changed at start of this shift. Skin assessed under dressing and replaced ?
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
## 2021-05-10 NOTE — TOC Progression Note (Addendum)
Transition of Care (TOC) - Progression Note  ? ? ?Patient Details  ?Name: Clydean Posas ?MRN: [id number] ?Date of Birth: 1932-03-22 ? ?Transition of Care (TOC) CM/SW Contact  ?Jerelyn Trimarco E Yvonne Stopher, LCSW ?Phone Number: ?05/10/2021, 8:54 AM ? ?Clinical Narrative:   Checked Navi portal. Auth still pending as of 1:35pm.  ? ? ? ?Expected Discharge Plan: [place] ?Barriers to Discharge: Continued Medical Work up ? ?Expected Discharge Plan and Services ?Expected Discharge Plan: [place] ?  ?  ?  ?  ?                ?  ?  ?  ?  ?  ?  ?  ?  ?  ?  ? ? ?Social Determinants of Health (SDOH) Interventions ?  ? ?Readmission Risk Interventions ?   ? View : No data to display.  ?  ?  ?  ? ? ?

## 2021-05-10 NOTE — Progress Notes (Signed)
Physical Therapy Treatment ?Patient Details ?Name: Aaniyah Strohm ?MRN: [id number] ?DOB: December 21, 1932 ?Today's Date: 05/10/2021 ? ? ?History of Present Illness Patient is an 88 year old female with history of CAD status post PCI, CVA with left-sided deficits, spastic hemiplegia of the left/nondominant side, multiple DVTs, currently on Eliquis, history of nontraumatic intraparenchymal hemorrhage of the brain, seizures, hypertension, hyperlipidemia, depression, anxiety, GERD, debility, who presents emergency department from [facility] clinic cardiology for chief concerns of urinary problems and placement. ? ?  ?PT Comments  ? ? Pt lethargic but did awaken during bed mobility training but did not communicate verbally.  Pt required total assist with all bed mobility tasks and near constant mod A once in sitting to prevent posterior LOB.  Pt with noted decreases in BLE hip and knee PROM but difficult to fully assess secondary to pt actively resisting efforts at times.  Pt will benefit from a trial of PT services in a SNF setting upon discharge to safely address deficits listed in patient problem list for decreased caregiver assistance and eventual return to PLOF. Pt likely a good candidate for transition to LTC facility.   ?   ?Recommendations for follow up therapy are one component of a multi-disciplinary discharge planning process, led by the attending physician.  Recommendations may be updated based on patient status, additional functional criteria and insurance authorization. ? ?Follow Up Recommendations ? Skilled nursing-short term rehab (<3 hours/day) ?  ?  ?Assistance Recommended at Discharge Frequent or constant Supervision/Assistance  ?Patient can return home with the following Two people to help with walking and/or transfers;Two people to help with bathing/dressing/bathroom;Assistance with cooking/housework;Direct supervision/assist for financial management;Direct supervision/assist for medications management;Assist  for transportation;Help with stairs or ramp for entrance ?  ?Equipment Recommendations ? None recommended by PT  ?  ?Recommendations for Other Services   ? ? ?  ?Precautions / Restrictions Precautions ?Precautions: Fall ?Restrictions ?Weight Bearing Restrictions: No  ?  ? ?Mobility ? Bed Mobility ?Overal bed mobility: Needs Assistance ?Bed Mobility: Supine to Sit, Sit to Supine, Rolling ?Rolling: +2 for physical assistance, Total assist ?  ?Supine to sit: Total assist, +2 for physical assistance ?Sit to supine: Total assist, +2 for physical assistance ?  ?General bed mobility comments: +2 Total assist for all bed mobility tasks; constant mod A to prevent posterior LOB in sitting ?  ? ?Transfers ?  ?  ?  ?  ?  ?  ?  ?  ?  ?General transfer comment: unable/unsafe to attempt ?  ? ?Ambulation/Gait ?  ?  ?  ?  ?  ?  ?  ?  ? ? ?Stairs ?  ?  ?  ?  ?  ? ? ?Wheelchair Mobility ?  ? ?Modified Rankin (Stroke Patients Only) ?  ? ? ?  ?Balance Overall balance assessment: Needs assistance, History of Falls ?  ?Sitting balance-Leahy Scale: Zero ?  ?Postural control: Posterior lean ?  ?  ?  ?  ?  ?  ?  ?  ?  ?  ?  ?  ?  ?  ?  ? ?  ?Cognition Arousal/Alertness: Lethargic ?Behavior During Therapy: Flat affect ?Overall Cognitive Status: No family/caregiver present to determine baseline cognitive functioning ?  ?  ?  ?  ?  ?  ?  ?  ?  ?  ?  ?  ?  ?  ?  ?  ?  ?  ?  ? ?  ?Exercises Other Exercises: Static sitting at EOB with constant  mod A for stability, rolling L/R ?Other Exercises: Gentle BLE knee/hip PROM to tolerance with tone/spasticity and poor ROM noted to BLEs ? ?  ?General Comments   ?  ?  ? ?Pertinent Vitals/Pain Pain Assessment ?Breathing: normal ?Negative Vocalization: occasional moan/groan, low speech, negative/disapproving quality ?Facial Expression: smiling or inexpressive ?Body Language: relaxed ?Consolability: no need to console ?PAINAD Score: 1  ? ? ?Home Living   ?  ?  ?  ?  ?  ?  ?  ?  ?  ?   ?  ?Prior Function    ?   ?  ?   ? ?PT Goals (current goals can now be found in the care plan section) Progress towards PT goals: PT to reassess next treatment ? ?  ?Frequency ? ? ? Min 2X/week ? ? ? ?  ?PT Plan Current plan remains appropriate  ? ? ?Co-evaluation   ?  ?  ?  ?  ? ?  ?AM-PAC PT "6 Clicks" Mobility   ?Outcome Measure ? Help needed turning from your back to your side while in a flat bed without using bedrails?: Total ?Help needed moving from lying on your back to sitting on the side of a flat bed without using bedrails?: Total ?Help needed moving to and from a bed to a chair (including a wheelchair)?: Total ?Help needed standing up from a chair using your arms (e.g., wheelchair or bedside chair)?: Total ?Help needed to walk in hospital room?: Total ?Help needed climbing 3-5 steps with a railing? : Total ?6 Click Score: 6 ? ?  ?End of Session   ?Activity Tolerance: Patient limited by lethargy ?Patient left: in bed;with call bell/phone within reach;with bed alarm set;with nursing/sitter in room ?Nurse Communication: Mobility status ?PT Visit Diagnosis: Muscle weakness (generalized) (M62.81);Unsteadiness on feet (R26.81);Other abnormalities of gait and mobility (R26.89) ?  ? ? ?Time: 5625-6389 ?PT Time Calculation (min) (ACUTE ONLY): 14 min ? ?Charges:  $Therapeutic Activity: 8-22 mins          ?          ? ?D. Royetta Asal PT, DPT ?05/10/21, 3:23 PM ? ? ?

## 2021-05-10 NOTE — Progress Notes (Signed)
Ativan administered at 2:30 am 03/24. Patient verbalized pain and Tylenol 650 mg was previously given. Patient is anxious and restless since start of shift. She attempts to get out of bed and is uncomfortable. Occasionally she yells for her nurse but expresses no needs when nurse is present. Melatonin previously administered but she has been unable to sleep through the night. ? ?Patient is a fall risk. Tele-monitoring order placed. ?

## 2021-05-10 NOTE — Progress Notes (Signed)
Video monitoring started on patient at 03:17 am on 03/24 ?

## 2021-05-10 NOTE — Progress Notes (Signed)
Per report from night shift RN, patient did not get any sleep. Was reportedly agitated and not easily redirected, tele sitter in place. Per RN, she was given PRN ativan on chart. On my assessment this morning patient is somnolent. Patient was soiled, changed and placed purwick. Patient not awake enough to take morning meds. Provider notified.  ?

## 2021-05-11 DIAGNOSIS — E43 Unspecified severe protein-calorie malnutrition: Secondary | ICD-10-CM | POA: Diagnosis present

## 2021-05-11 DIAGNOSIS — R410 Disorientation, unspecified: Secondary | ICD-10-CM

## 2021-05-11 DIAGNOSIS — I6521 Occlusion and stenosis of right carotid artery: Secondary | ICD-10-CM | POA: Diagnosis not present

## 2021-05-11 DIAGNOSIS — I2510 Atherosclerotic heart disease of native coronary artery without angina pectoris: Secondary | ICD-10-CM | POA: Diagnosis present

## 2021-05-11 DIAGNOSIS — Z23 Encounter for immunization: Secondary | ICD-10-CM | POA: Diagnosis present

## 2021-05-11 DIAGNOSIS — L89322 Pressure ulcer of left buttock, stage 2: Secondary | ICD-10-CM | POA: Diagnosis present

## 2021-05-11 DIAGNOSIS — Z681 Body mass index (BMI) 19 or less, adult: Secondary | ICD-10-CM | POA: Diagnosis not present

## 2021-05-11 DIAGNOSIS — R627 Adult failure to thrive: Secondary | ICD-10-CM | POA: Diagnosis present

## 2021-05-11 DIAGNOSIS — K219 Gastro-esophageal reflux disease without esophagitis: Secondary | ICD-10-CM | POA: Diagnosis present

## 2021-05-11 DIAGNOSIS — E876 Hypokalemia: Secondary | ICD-10-CM | POA: Diagnosis present

## 2021-05-11 DIAGNOSIS — E785 Hyperlipidemia, unspecified: Secondary | ICD-10-CM | POA: Diagnosis present

## 2021-05-11 DIAGNOSIS — R569 Unspecified convulsions: Secondary | ICD-10-CM | POA: Diagnosis present

## 2021-05-11 DIAGNOSIS — R5381 Other malaise: Secondary | ICD-10-CM | POA: Diagnosis present

## 2021-05-11 DIAGNOSIS — I69354 Hemiplegia and hemiparesis following cerebral infarction affecting left non-dominant side: Secondary | ICD-10-CM | POA: Diagnosis not present

## 2021-05-11 DIAGNOSIS — I69318 Other symptoms and signs involving cognitive functions following cerebral infarction: Secondary | ICD-10-CM | POA: Diagnosis not present

## 2021-05-11 DIAGNOSIS — Z86718 Personal history of other venous thrombosis and embolism: Secondary | ICD-10-CM | POA: Diagnosis not present

## 2021-05-11 DIAGNOSIS — E039 Hypothyroidism, unspecified: Secondary | ICD-10-CM | POA: Diagnosis present

## 2021-05-11 DIAGNOSIS — F419 Anxiety disorder, unspecified: Secondary | ICD-10-CM | POA: Diagnosis present

## 2021-05-11 DIAGNOSIS — Z79890 Hormone replacement therapy (postmenopausal): Secondary | ICD-10-CM | POA: Diagnosis not present

## 2021-05-11 DIAGNOSIS — Z7189 Other specified counseling: Secondary | ICD-10-CM | POA: Diagnosis not present

## 2021-05-11 DIAGNOSIS — F32A Depression, unspecified: Secondary | ICD-10-CM | POA: Diagnosis present

## 2021-05-11 DIAGNOSIS — T7691XA Unspecified adult maltreatment, suspected, initial encounter: Secondary | ICD-10-CM | POA: Diagnosis present

## 2021-05-11 DIAGNOSIS — Z79899 Other long term (current) drug therapy: Secondary | ICD-10-CM | POA: Diagnosis not present

## 2021-05-11 DIAGNOSIS — L89024 Pressure ulcer of left elbow, stage 4: Secondary | ICD-10-CM | POA: Diagnosis present

## 2021-05-11 DIAGNOSIS — I10 Essential (primary) hypertension: Secondary | ICD-10-CM | POA: Diagnosis present

## 2021-05-11 DIAGNOSIS — F0150 Vascular dementia without behavioral disturbance: Secondary | ICD-10-CM | POA: Diagnosis present

## 2021-05-11 MED ORDER — HALOPERIDOL LACTATE 5 MG/ML IJ SOLN
1.0000 mg | Freq: Four times a day (QID) | INTRAMUSCULAR | Status: DC | PRN
  Administered 2021-05-11 – 2021-05-12 (×2): 1 mg via INTRAVENOUS
  Filled 2021-05-11 (×2): qty 1

## 2021-05-11 MED ORDER — KETOROLAC TROMETHAMINE 15 MG/ML IJ SOLN
15.0000 mg | Freq: Three times a day (TID) | INTRAMUSCULAR | Status: AC | PRN
  Administered 2021-05-11 – 2021-05-13 (×4): 15 mg via INTRAVENOUS
  Filled 2021-05-11 (×4): qty 1

## 2021-05-11 MED ORDER — METHOCARBAMOL 1000 MG/10ML IJ SOLN
500.0000 mg | Freq: Once | INTRAVENOUS | Status: AC
  Administered 2021-05-11: 500 mg via INTRAVENOUS
  Filled 2021-05-11: qty 5

## 2021-05-11 MED ORDER — LORAZEPAM 0.5 MG PO TABS: 0.5000 mg | ORAL_TABLET | Freq: Once | ORAL | Status: DC

## 2021-05-11 MED ORDER — LORAZEPAM 2 MG/ML IJ SOLN
0.5000 mg | Freq: Once | INTRAMUSCULAR | Status: AC
  Administered 2021-05-11: 0.5 mg via INTRAVENOUS
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
## 2021-05-11 NOTE — Progress Notes (Signed)
Medications scanned, crushed, mixed with applesause, attempted to administer, pt spit out all medications and would not follow commands to swallow medications. MD made aware. No new orders at this time.  ?

## 2021-05-11 NOTE — Assessment & Plan Note (Addendum)
-  In context of hx of CVA, suspect underlying vascular dementia ?-Presenting head CT neg for acute process ?-Family reports change in mentation and gradual decline ever since CVA diagnosis about 4 years ago. ?--currently not responding to questions or following commands ?-UA neg x2, head CT neg. Afebrile. No leukocytosis. Doubt infectious process ?-discussed case with Neurology who recommends focus on establishing day/night cycle and pt was started on seroquel, which was later d/c'ed due to extreme somnolence ?-EEG unvealing ?

## 2021-05-11 NOTE — Progress Notes (Signed)
SLP Cancellation Note ? ?Patient Details ?Name: Sonya [name] ?MRN: [id number] ?DOB: Apr 14, 1932 ? ? ?Cancelled treatment:       Reason Eval/Treat Not Completed: Patient not medically ready;Medical issues which prohibited therapy (chart reviewed; consulted NSG and family/sitter in room.) ?Upon entering room, noted pt writhing in bed grabbing at grandson's arm/hand at times. Eyes closed and moaning. Family and NSG stated she was uncomfortable and not agreeable to swallowing meds Crushed in puree earlier this morning d/t the discomfort and confusion; gave supportive medication at that time. Sitter present in room.  ?In setting of pt's current discomfort and decreased alertness to safely take oral intake, recommend Holding on any po's until pt is calm/alert; Family and NSG agreed. Do not recommend consideration of diet upgrade at this time. Precautions posted in room, chart; NSG to monitor any oral intake.  ?ST services will f/u next 1-2 days.  ? ? ? ? ? ?Orinda Kenner, MS, CCC-SLP ?Speech Language Pathologist ?[facility]; [place] ?[phone] (ascom) ?[facility] ?05/11/2021, 12:49 PM ?

## 2021-05-11 NOTE — Progress Notes (Signed)
[employer] portal shows Auth still pending  ?

## 2021-05-12 DIAGNOSIS — I6521 Occlusion and stenosis of right carotid artery: Secondary | ICD-10-CM | POA: Diagnosis not present

## 2021-05-12 DIAGNOSIS — E876 Hypokalemia: Secondary | ICD-10-CM | POA: Diagnosis not present

## 2021-05-12 DIAGNOSIS — R410 Disorientation, unspecified: Secondary | ICD-10-CM | POA: Diagnosis not present

## 2021-05-12 LAB — CBC
HCT: 35.8 % — ABNORMAL LOW (ref 36.0–46.0)
Hemoglobin: 11.2 g/dL — ABNORMAL LOW (ref 12.0–15.0)
MCH: 28.7 pg (ref 26.0–34.0)
MCHC: 31.3 g/dL (ref 30.0–36.0)
MCV: 91.8 fL (ref 80.0–100.0)
Platelets: 307 10*3/uL (ref 150–400)
RBC: 3.9 MIL/uL (ref 3.87–5.11)
RDW: 14.9 % (ref 11.5–15.5)
WBC: 12.8 10*3/uL — ABNORMAL HIGH (ref 4.0–10.5)
nRBC: 0 % (ref 0.0–0.2)

## 2021-05-12 LAB — COMPREHENSIVE METABOLIC PANEL
ALT: 13 U/L (ref 0–44)
AST: 23 U/L (ref 15–41)
Albumin: 2.8 g/dL — ABNORMAL LOW (ref 3.5–5.0)
Alkaline Phosphatase: 67 U/L (ref 38–126)
Anion gap: 9 (ref 5–15)
BUN: 13 mg/dL (ref 8–23)
CO2: 27 mmol/L (ref 22–32)
Calcium: 9.2 mg/dL (ref 8.9–10.3)
Chloride: 101 mmol/L (ref 98–111)
Creatinine, Ser: 0.72 mg/dL (ref 0.44–1.00)
GFR, Estimated: >60 mL/min (ref >60)
Glucose, Bld: 89 mg/dL (ref 70–99)
Potassium: 4 mmol/L (ref 3.5–5.1)
Sodium: 137 mmol/L (ref 135–145)
Total Bilirubin: 1 mg/dL (ref 0.3–1.2)
Total Protein: 5.6 g/dL — ABNORMAL LOW (ref 6.5–8.1)

## 2021-05-12 LAB — URINALYSIS, ROUTINE W REFLEX MICROSCOPIC
Bilirubin Urine: NEGATIVE
Glucose, UA: NEGATIVE mg/dL
Hgb urine dipstick: NEGATIVE
Ketones, ur: 5 mg/dL — AB
Leukocytes,Ua: NEGATIVE
Nitrite: NEGATIVE
Protein, ur: 30 mg/dL — AB
Specific Gravity, Urine: 1.023 (ref 1.005–1.030)
pH: 5 (ref 5.0–8.0)

## 2021-05-12 MED ORDER — LACTATED RINGERS IV SOLN: INTRAVENOUS | Status: DC

## 2021-05-12 MED ORDER — METOPROLOL TARTRATE 25 MG PO TABS
25.0000 mg | ORAL_TABLET | Freq: Two times a day (BID) | ORAL | Status: DC
  Administered 2021-05-12 – 2021-05-15 (×6): 25 mg via ORAL
  Filled 2021-05-12 (×7): qty 1

## 2021-05-12 MED ORDER — LEVETIRACETAM 100 MG/ML PO SOLN
500.0000 mg | Freq: Two times a day (BID) | ORAL | Status: DC
  Administered 2021-05-12 – 2021-05-15 (×6): 500 mg via ORAL
  Filled 2021-05-12 (×9): qty 5

## 2021-05-12 MED ORDER — LISINOPRIL 5 MG PO TABS
5.0000 mg | ORAL_TABLET | Freq: Every day | ORAL | Status: DC
  Administered 2021-05-12 – 2021-05-15 (×4): 5 mg via ORAL
  Filled 2021-05-12 (×4): qty 1

## 2021-05-12 NOTE — Progress Notes (Signed)
Patient rested well from 2000-0200, was intermittently restless after 0200 haldol given and was able to rest more periodically. Patient seems comfortable and resting.will continue to monitor  ?

## 2021-05-12 NOTE — Progress Notes (Signed)
Speech Language Pathology Treatment:    ?Patient Details ?Name: Bianna Haran ?MRN: [id number] ?DOB: 1932-03-14 ?Today's Date: 05/12/2021 ?Time: [phone] ?SLP Time Calculation (min) (ACUTE ONLY): 20 min ? ?Assessment / Plan / Recommendation ?Clinical Impression ? Pt seen for diet tolerance. Upon SLP entrance to room, pt lethargic/drowsy and only rouses briefly. Supportive Grandson at bedside.  ? ?Diet tolerance completed via chart review and discussion with Grandson. Per chart review, temp WNL and WBC elevated. No recent chest imaging noted. Per Yolanda Bonine, pt tolerating pureed diet with thin liquids without overt s/sx pharyngeal dysphagia. Grandson noted pt with intermittent oral holding requiring verbal cueing. Pt required 1:1 assistance with feeding during breakfast this AM. Yolanda Bonine stated pt with recent decline in overall functioning most appreciated during this hospitalization. Per Yolanda Bonine, pt able to feed self and consumed a softer/bite sized diet with thin liquids PTA.  ? ?Deferred PO trials and trial of upgraded textures this date due to pt's LOA. Grandson in agreement.  ? ?Recommend continuation of pureed diet with thin liquids and safe swallowing strategies/aspiration precautions as outlined below.  ? ?Concern for pt's ability to maintain nutritional needs orally. Noted RD following.  ? ?Grandson educated re: diet recommendations, safe swallowing strategies/aspiration precautions, and SLP POC. Grandson verbalized understanding/agreement. RN also made aware.  ? ?  ?HPI HPI: Per H&P "Ms. Kamile Fassler is a 88 year old female with history of CAD status post PCI in 1997, history of CVA with left-sided deficits, spastic hemiplegia of the left/nondominant side, multiple DVTs, currently on Eliquis, history of nontraumatic intraparenchymal hemorrhage of the brain, history of seizures, hypertension, hyperlipidemia, depression, anxiety, GERD, debility, who presents emergency department from [facility] clinic  cardiology for chief concerns of urinary problems and placement.     Initial vitals in the emergency department showed temperature of 97.9, respiration rate of 20, heart rate 93, blood pressure 175/82, increased to 190/99, SPO2 of 97% on room air.    Serum sodium 139, potassium 2.8, chloride 95, bicarb 30, BUN of 20, serum creatinine of 0.86, nonfasting blood glucose 128, WBC 13.1, hemoglobin 13.6, platelets of 315.     Magnesium was 1.6.     UA was negative for leukocytes and nitrites.  Positive for hemoglobin.     ED treatment: Magnesium 2 g IV, potassium chloride 10 mill equivalent x2, sodium chloride 1 L bolus.     At bedside she is able to tell me her name, her current location of hospital, and the current month.  She has difficulty with here as she states she has been bedbound since 2019.  She states that she is in her 100s, 43 or 52.     She endorses no acute complaints.  She states that she has a hemiplegia and neuropathy pain.  She states when she has the nerve pain, she shakes all over and she is aware that she is having the pain and she is talking at the same time.  She endorses compliance with her seizure medication.    Her grandson [name] is at bedside with her permission.     Her grandson at bedside states the patient has been having poor p.o. intake in the last several weeks.  He states that she has had difficulty swallowing though she denies this.  He states that he has had to cut up her food into small chunks of and or make the food really soft for her to swallow.  She adamantly denies all of these stating that she can eat just fine.  She endorses a chronic cough with white phlegm.  She denies fever, chest pain, shortness of breath, abdominal pain, dysuria, hematuria, diarrhea, weakness, nausea, vomiting, weight changes.     Social history: She is a widow and lives with her son. She has grandson help occasionally. She is a former tobacco user and quit a long time ago. She denies etoh and  recreational drug use. 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  ?   ?SLP Plan ? Continue with current plan of care ? ?  ?  ?Recommendations for follow up therapy are one component of a multi-disciplinary discharge planning process, led by the attending physician.  Recommendations may be updated based on patient status, additional functional criteria and insurance authorization. ?  ? ?Recommendations  ?Diet recommendations: Dysphagia 1 (puree);Thin liquid ?Medication Administration: Crushed with puree (vs whole with puree) ?Supervision: Staff to assist with self feeding;Trained caregiver to feed patient ?Compensations: Minimize environmental distractions;Slow rate;Small sips/bites;Follow solids with liquid (check mouth for oral clearance) ?Postural Changes and/or Swallow Maneuvers: Seated upright 90 degrees;Upright 30-60 min after meal  ?   ?    ?   ? ? ? ? General recommendations:  (RD consult) ?Oral Care Recommendations: Oral care QID;Oral care before and after PO;Staff/trained caregiver to provide oral care ?Follow Up Recommendations: Skilled nursing-short term rehab (<3 hours/day) ?Assistance recommended at discharge: Frequent or constant Supervision/Assistance ?SLP Visit Diagnosis: Dysphagia, oral phase (R13.11) ?Plan: Continue with current plan of care ? ? ? ? ?  ?  ? ?Cherrie Gauze, M.S., CCC-SLP ?Speech-Language Pathologist ?[facility] ?[phone] ([place])  ? ?Quintella Baton ? ?05/12/2021, 12:22 PM ?
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
## 2021-05-12 NOTE — TOC Progression Note (Signed)
Transition of Care (TOC) - Progression Note  ? ? ?Patient Details  ?Name: Sonya [name] ?MRN: [id number] ?Date of Birth: 01/24/1933 ? ?Transition of Care (TOC) CM/SW Contact  ?Deysha Cartier, LCSW ?Phone Number: ?05/12/2021, 3:34 PM ? ?Clinical Narrative:    ? ?Insurance authorization remains pending for SNF placement. ? ?Expected Discharge Plan: [place] ?Barriers to Discharge: Continued Medical Work up ? ?Expected Discharge Plan and Services ?Expected Discharge Plan: [place] ?  ?  ?  ?  ?                ?  ?  ?  ?  ?  ?  ?  ?  ?  ?  ? ? ?Social Determinants of Health (SDOH) Interventions ?  ? ?Readmission Risk Interventions ?   ? View : No data to display.  ?  ?  ?  ? ? ?

## 2021-05-12 NOTE — Progress Notes (Signed)
?   05/11/21 1957  ?Assess: MEWS Score  ?Temp 98.9 ?F (37.2 ?C)  ?BP (!) 163/62  ?Pulse Rate (!) 134 ?(patient is very restless)  ?Resp 18  ?Level of Consciousness Alert  ?SpO2 98 %  ?O2 Device Room Air  ?Assess: MEWS Score  ?MEWS Temp 0  ?MEWS Systolic 0  ?MEWS Pulse 3  ?MEWS RR 0  ?MEWS LOC 0  ?MEWS Score 3  ?MEWS Score Color Yellow  ?Assess: if the MEWS score is Yellow or Red  ?Were vital signs taken at a resting state? Yes  ?Focused Assessment No change from prior assessment  ?Does the patient meet 2 or more of the SIRS criteria? No  ?MEWS guidelines implemented *See Row Information* Yes  ?Treat  ?MEWS Interventions Administered scheduled meds/treatments  ?Pain Scale PAINAD  ?Breathing 0  ?Negative Vocalization 0  ?Facial Expression 0  ?Body Language 1  ?Consolability 0  ?PAINAD Score 1  ?Take Vital Signs  ?Increase Vital Sign Frequency  Yellow: Q 2hr X 2 then Q 4hr X 2, if remains yellow, continue Q 4hrs  ?Escalate  ?MEWS: Escalate Yellow: discuss with charge nurse/RN and consider discussing with provider and RRT  ?Notify: Charge Nurse/RN  ?Name of Charge Nurse/RN Notified Felicia  ?Date Charge Nurse/RN Notified 05/11/21  ?Time Charge Nurse/RN Notified 2000  ?Document  ?Progress note created (see row info) Yes  ?Assess: SIRS CRITERIA  ?SIRS Temperature  0  ?SIRS Pulse 1  ?SIRS Respirations  0  ?SIRS WBC 0  ?SIRS Score Sum  1  ? ?Patient has been NST throughout the day, patient is restless and asymptomatic. Will give her night medication to help her rest tonight.   ?

## 2021-05-13 DIAGNOSIS — E876 Hypokalemia: Secondary | ICD-10-CM | POA: Diagnosis not present

## 2021-05-13 LAB — CBC
HCT: 33.5 % — ABNORMAL LOW (ref 36.0–46.0)
Hemoglobin: 10.5 g/dL — ABNORMAL LOW (ref 12.0–15.0)
MCH: 29.1 pg (ref 26.0–34.0)
MCHC: 31.3 g/dL (ref 30.0–36.0)
MCV: 92.8 fL (ref 80.0–100.0)
Platelets: 290 10*3/uL (ref 150–400)
RBC: 3.61 MIL/uL — ABNORMAL LOW (ref 3.87–5.11)
RDW: 14.6 % (ref 11.5–15.5)
WBC: 11.2 10*3/uL — ABNORMAL HIGH (ref 4.0–10.5)
nRBC: 0 % (ref 0.0–0.2)

## 2021-05-13 LAB — COMPREHENSIVE METABOLIC PANEL
ALT: 12 U/L (ref 0–44)
AST: 17 U/L (ref 15–41)
Albumin: 2.3 g/dL — ABNORMAL LOW (ref 3.5–5.0)
Alkaline Phosphatase: 57 U/L (ref 38–126)
Anion gap: 7 (ref 5–15)
BUN: 13 mg/dL (ref 8–23)
CO2: 29 mmol/L (ref 22–32)
Calcium: 8.7 mg/dL — ABNORMAL LOW (ref 8.9–10.3)
Chloride: 100 mmol/L (ref 98–111)
Creatinine, Ser: 0.68 mg/dL (ref 0.44–1.00)
GFR, Estimated: >60 mL/min (ref >60)
Glucose, Bld: 102 mg/dL — ABNORMAL HIGH (ref 70–99)
Potassium: 4.3 mmol/L (ref 3.5–5.1)
Sodium: 136 mmol/L (ref 135–145)
Total Bilirubin: 0.8 mg/dL (ref 0.3–1.2)
Total Protein: 4.7 g/dL — ABNORMAL LOW (ref 6.5–8.1)

## 2021-05-13 MED ORDER — QUETIAPINE FUMARATE 25 MG PO TABS
25.0000 mg | ORAL_TABLET | Freq: Every day | ORAL | Status: DC
Start: 2021-05-13 — End: 2021-05-15
  Administered 2021-05-13: 25 mg via ORAL
  Filled 2021-05-13 (×2): qty 1

## 2021-05-13 MED ORDER — CITALOPRAM HYDROBROMIDE 20 MG PO TABS
20.0000 mg | ORAL_TABLET | Freq: Every day | ORAL | Status: DC
  Administered 2021-05-14 – 2021-05-22 (×3): 20 mg via ORAL
  Filled 2021-05-13 (×6): qty 1

## 2021-05-13 NOTE — TOC Progression Note (Signed)
Transition of Care (TOC) - Progression Note  ? ? ?Patient Details  ?Name: Sonya [name] ?MRN: [id number] ?Date of Birth: 09-Jul-1932 ? ?Transition of Care (TOC) CM/SW Contact  ?Alberteen Sam, LCSW ?Phone Number: ?05/13/2021, 2:49 PM ? ?Clinical Narrative:    ? ?Per [facility] if medically stable they can take patient for long term care tomorrow.  ? ?CSW has informed patient's grandson [name].  ? ?Per MD not medically stable today due to lethargy.  ? ?Expected Discharge Plan: [place] ?Barriers to Discharge: Continued Medical Work up ? ?Expected Discharge Plan and Services ?Expected Discharge Plan: [place] ?  ?  ?  ?  ?                ?  ?  ?  ?  ?  ?  ?  ?  ?  ?  ? ? ?Social Determinants of Health (SDOH) Interventions ?  ? ?Readmission Risk Interventions ?   ? View : No data to display.  ?  ?  ?  ? ? ?

## 2021-05-13 NOTE — Progress Notes (Addendum)
Patient somnolent this morning. Awakens briefly to voice. Was able to take a few meds with applesauce this morning but could not finish them. Did not eat breakfast. Around 1200 started to moan, saying "please". When asked what she needed, she said "help me" but could not clarify what she needed. Patient repositioned and PRN toradol given. Verbal reassurance provided. Patient able to finish morning meds and eat ice cream off of lunch tray at this time.  ?
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
## 2021-05-13 NOTE — Progress Notes (Signed)
Physical Therapy Treatment ?Patient Details ?Name: Sonya [name] ?MRN: [id number] ?DOB: 05-03-1932 ?Today's Date: 05/13/2021 ? ? ?History of Present Illness Patient is an 88 year old female with history of CAD status post PCI, CVA with left-sided deficits, spastic hemiplegia of the left/nondominant side, multiple DVTs, currently on Eliquis, history of nontraumatic intraparenchymal hemorrhage of the brain, seizures, hypertension, hyperlipidemia, depression, anxiety, GERD, debility, who presents emergency department from [facility] clinic cardiology for chief concerns of urinary problems and placement. ? ?  ?PT Comments  ? ? Awakens to gentle voice but does fall asleep x 2 during exercises.  BLE PROM and stretching.  Very gentle LUE ROM but limited due to pain.  RUE ROM with increased tolerance.  Given lethargy mobility is deferred at this time.  She does seem to answer questions appropriately despite very soft volume and difficulty to understand. ?  ?Recommendations for follow up therapy are one component of a multi-disciplinary discharge planning process, led by the attending physician.  Recommendations may be updated based on patient status, additional functional criteria and insurance authorization. ? ?Follow Up Recommendations ? Skilled nursing-short term rehab (<3 hours/day) ?  ?  ?Assistance Recommended at Discharge Frequent or constant Supervision/Assistance  ?Patient can return home with the following Two people to help with walking and/or transfers;Two people to help with bathing/dressing/bathroom;Assistance with cooking/housework;Direct supervision/assist for financial management;Direct supervision/assist for medications management;Assist for transportation;Help with stairs or ramp for entrance ?  ?Equipment Recommendations ? None recommended by PT  ?  ?Recommendations for Other Services   ? ? ?  ?Precautions / Restrictions Precautions ?Precautions: Fall ?Restrictions ?Weight Bearing Restrictions: No  ?   ? ?Mobility ? Bed Mobility ?  ?  ?  ?  ?  ?  ?  ?General bed mobility comments: total assist ?  ? ?Transfers ?  ?  ?  ?  ?  ?  ?  ?  ?  ?  ?  ? ?Ambulation/Gait ?  ?  ?  ?  ?  ?  ?  ?  ? ? ?Stairs ?  ?  ?  ?  ?  ? ? ?Wheelchair Mobility ?  ? ?Modified Rankin (Stroke Patients Only) ?  ? ? ?  ?Balance   ?  ?  ?  ?  ?  ?  ?  ?  ?  ?  ?  ?  ?  ?  ?  ?  ?  ?  ?  ? ?  ?Cognition Arousal/Alertness: Lethargic ?Behavior During Therapy: [facility] for tasks assessed/performed ?Overall Cognitive Status: No family/caregiver present to determine baseline cognitive functioning ?  ?  ?  ?  ?  ?  ?  ?  ?  ?  ?  ?  ?  ?  ?  ?  ?General Comments: tries to answer questions when asked and despite low volume they seem appropriate answers. ?  ?  ? ?  ?Exercises Other Exercises ?Other Exercises: BLE and UE AA/PROM x 10 ? ?  ?General Comments   ?  ?  ? ?Pertinent Vitals/Pain Pain Assessment ?Pain Assessment: Faces ?Faces Pain Scale: Hurts whole lot ?Pain Location: left UE ROM primarily painful ?Pain Descriptors / Indicators: Discomfort, Dull, Grimacing ?Pain Intervention(s): Limited activity within patient's tolerance, Monitored during session, Repositioned  ? ? ?Home Living   ?  ?  ?  ?  ?  ?  ?  ?  ?  ?   ?  ?Prior Function    ?  ?  ?   ? ?  PT Goals (current goals can now be found in the care plan section) Progress towards PT goals: Not progressing toward goals - comment ? ?  ?Frequency ? ? ? Min 2X/week ? ? ? ?  ?PT Plan Current plan remains appropriate  ? ? ?Co-evaluation   ?  ?  ?  ?  ? ?  ?AM-PAC PT "6 Clicks" Mobility   ?Outcome Measure ? Help needed turning from your back to your side while in a flat bed without using bedrails?: Total ?Help needed moving from lying on your back to sitting on the side of a flat bed without using bedrails?: Total ?Help needed moving to and from a bed to a chair (including a wheelchair)?: Total ?Help needed standing up from a chair using your arms (e.g., wheelchair or bedside chair)?: Total ?Help needed to  walk in hospital room?: Total ?Help needed climbing 3-5 steps with a railing? : Total ?6 Click Score: 6 ? ?  ?End of Session   ?Activity Tolerance: Patient limited by lethargy;Patient limited by pain ?Patient left: in bed;with call bell/phone within reach;with bed alarm set ?Nurse Communication: Mobility status ?PT Visit Diagnosis: Muscle weakness (generalized) (M62.81);Unsteadiness on feet (R26.81);Other abnormalities of gait and mobility (R26.89) ?  ? ? ?Time: 2956-2130 ?PT Time Calculation (min) (ACUTE ONLY): 11 min ? ?Charges:  $Therapeutic Exercise: 8-22 mins          ?         Chesley Noon, PTA ?05/13/21, 2:06 PM ? ?

## 2021-05-13 NOTE — Progress Notes (Signed)
Occupational Therapy Treatment ?Patient Details ?Name: Sonya [name] ?MRN: [id number] ?DOB: April 29, 1932 ?Today's Date: 05/13/2021 ? ? ?History of present illness Patient is an 88 year old female with history of CAD status post PCI, CVA with left-sided deficits, spastic hemiplegia of the left/nondominant side, multiple DVTs, currently on Eliquis, history of nontraumatic intraparenchymal hemorrhage of the brain, seizures, hypertension, hyperlipidemia, depression, anxiety, GERD, debility, who presents emergency department from [facility] clinic cardiology for chief concerns of urinary problems and placement. ?  ?OT comments ? Pt very lethargic and does not open eyes with arousal attempts for OT intervention. Heavy hand over hand assistance to utilize R UE to wash face and total A to wash L hand. Use of lemon swab to attempt to alert pt further with no success. Pt will not open mouth and concerns about if pt is pocketing of not. RN notified. Pt does give cough during session. Total A to EOB with pt continuing to keep eyes closed. She does mumble but unable to understand her. Posterior bias in static sitting requiring max- total A to remain upright. Total A to return to bed with pillows placed for comfort. HOB elevated to 30 degrees for safety. Pt making minimal progress this session toward OT goals.   ? ?Recommendations for follow up therapy are one component of a multi-disciplinary discharge planning process, led by the attending physician.  Recommendations may be updated based on patient status, additional functional criteria and insurance authorization. ?   ?Follow Up Recommendations ? Skilled nursing-short term rehab (<3 hours/day)  ?  ?Assistance Recommended at Discharge Frequent or constant Supervision/Assistance  ?Patient can return home with the following ? Two people to help with walking and/or transfers;Two people to help with bathing/dressing/bathroom ?  ?Equipment Recommendations ? Other (comment) (defer to next  venue of care)  ?  ?   ?Precautions / Restrictions Precautions ?Precautions: Fall ?Restrictions ?Weight Bearing Restrictions: No  ? ? ?  ? ?Mobility Bed Mobility ?Overal bed mobility: Needs Assistance ?Bed Mobility: Supine to Sit, Sit to Supine, Rolling ?Rolling: Total assist ?  ?Supine to sit: Total assist ?Sit to supine: Total assist ?  ?  ?  ? ?Transfers ?  ?  ?  ?  ?  ?  ?  ?  ?  ?General transfer comment: unable/unsafe to attempt ?  ?  ?Balance Overall balance assessment: Needs assistance, History of Falls ?Sitting-balance support: Feet unsupported, No upper extremity supported ?Sitting balance-Leahy Scale: Zero ?Sitting balance - Comments: keeps eyes closed while EOB and needs significant assist for posterior bias ?  ?  ?  ?  ?  ?  ?  ?  ?  ?  ?  ?  ?  ?  ?  ?   ? ?ADL either performed or assessed with clinical judgement  ? ?ADL Overall ADL's : Needs assistance/impaired ?  ?  ?  ?  ?  ?  ?  ?  ?  ?  ?  ?  ?  ?  ?  ?  ?  ?  ?  ?General ADL Comments: total A for all tasks ?  ? ?Extremity/Trunk Assessment Upper Extremity Assessment ?RUE Deficits / Details: patient has some active movement in RUE, however has generalized weakness throughout. ?LUE Deficits / Details: spastic hemiplegia at baseline with pain with PROM attempts. patient hold arm in flexed position and has pain with any movement. ?  ?  ?  ?  ?  ? ?Vision Patient Visual Report: No change from baseline ?  ?  ?   ?   ? ?  Cognition Arousal/Alertness: Lethargic ?  ?Overall Cognitive Status: No family/caregiver present to determine baseline cognitive functioning ?  ?  ?  ?  ?  ?  ?  ?  ?  ?  ?  ?  ?  ?  ?  ?  ?General Comments: put only mumbles, no active participation, and does not open eyes during session ?  ?  ?   ?   ?   ?   ? ? ?Pertinent Vitals/ Pain       Pain Assessment ?Pain Assessment: Faces ?Faces Pain Scale: Hurts little more ?Pain Location: generalized with bed mobility ?Pain Descriptors / Indicators: Discomfort, Dull, Grimacing ?Pain  Intervention(s): Limited activity within patient's tolerance, Monitored during session, Repositioned ? ?   ?   ? ?Frequency ? Min 1X/week  ? ? ? ? ?  ?Progress Toward Goals ? ?OT Goals(current goals can now be found in the care plan section) ? Progress towards OT goals: Not progressing toward goals - comment (no active participation this session) ? ?Acute Rehab OT Goals ?Patient Stated Goal: none stated ?OT Goal Formulation: Patient unable to participate in goal setting ?Time For Goal Achievement: 05/23/21 ?Potential to Achieve Goals: Fair  ?Plan Discharge plan remains appropriate;Frequency remains appropriate   ? ?   ?AM-PAC OT "6 Clicks" Daily Activity     ?Outcome Measure ? ? Help from another person eating meals?: Total ?Help from another person taking care of personal grooming?: Total ?Help from another person toileting, which includes using toliet, bedpan, or urinal?: Total ?Help from another person bathing (including washing, rinsing, drying)?: Total ?Help from another person to put on and taking off regular upper body clothing?: Total ?Help from another person to put on and taking off regular lower body clothing?: Total ?6 Click Score: 6 ? ?  ?End of Session   ? ?OT Visit Diagnosis: Unsteadiness on feet (R26.81);Muscle weakness (generalized) (M62.81) ?  ?Activity Tolerance Patient tolerated treatment well;Patient limited by pain ?  ?Patient Left in bed;with call bell/phone within reach;with nursing/sitter in room;with family/visitor present ?  ?Nurse Communication Mobility status ?  ? ?   ? ?Time: 6283-1517 ?OT Time Calculation (min): 15 min ? ?Charges: OT General Charges ?$OT Visit: 1 Visit ?OT Treatments ?$Therapeutic Activity: 8-22 mins ? ?Darleen Crocker, [place], OTR/L , CBIS ?ascom [phone]  ?05/13/21, 2:43 PM  ?

## 2021-05-14 DIAGNOSIS — E876 Hypokalemia: Secondary | ICD-10-CM | POA: Diagnosis not present

## 2021-05-14 DIAGNOSIS — R410 Disorientation, unspecified: Secondary | ICD-10-CM | POA: Diagnosis not present

## 2021-05-14 LAB — CBC
HCT: 33.9 % — ABNORMAL LOW (ref 36.0–46.0)
Hemoglobin: 10.9 g/dL — ABNORMAL LOW (ref 12.0–15.0)
MCH: 29.3 pg (ref 26.0–34.0)
MCHC: 32.2 g/dL (ref 30.0–36.0)
MCV: 91.1 fL (ref 80.0–100.0)
Platelets: 290 10*3/uL (ref 150–400)
RBC: 3.72 MIL/uL — ABNORMAL LOW (ref 3.87–5.11)
RDW: 14.7 % (ref 11.5–15.5)
WBC: 8.8 10*3/uL (ref 4.0–10.5)
nRBC: 0 % (ref 0.0–0.2)

## 2021-05-14 LAB — COMPREHENSIVE METABOLIC PANEL
ALT: 11 U/L (ref 0–44)
AST: 18 U/L (ref 15–41)
Albumin: 2.4 g/dL — ABNORMAL LOW (ref 3.5–5.0)
Alkaline Phosphatase: 55 U/L (ref 38–126)
Anion gap: 8 (ref 5–15)
BUN: 14 mg/dL (ref 8–23)
CO2: 28 mmol/L (ref 22–32)
Calcium: 8.9 mg/dL (ref 8.9–10.3)
Chloride: 100 mmol/L (ref 98–111)
Creatinine, Ser: 0.81 mg/dL (ref 0.44–1.00)
GFR, Estimated: >60 mL/min (ref >60)
Glucose, Bld: 97 mg/dL (ref 70–99)
Potassium: 4 mmol/L (ref 3.5–5.1)
Sodium: 136 mmol/L (ref 135–145)
Total Bilirubin: 0.7 mg/dL (ref 0.3–1.2)
Total Protein: 4.9 g/dL — ABNORMAL LOW (ref 6.5–8.1)

## 2021-05-14 LAB — VITAMIN B12: Vitamin B-12: 954 pg/mL — ABNORMAL HIGH (ref 180–914)

## 2021-05-14 LAB — TSH: TSH: 3.359 u[IU]/mL (ref 0.350–4.500)

## 2021-05-14 LAB — AMMONIA: Ammonia: <10 umol/L (ref 9–35)

## 2021-05-14 MED ORDER — HYDRALAZINE HCL 20 MG/ML IJ SOLN
10.0000 mg | INTRAMUSCULAR | Status: DC | PRN
Start: 2021-05-14 — End: 2021-05-22
  Administered 2021-05-14 – 2021-05-20 (×8): 10 mg via INTRAVENOUS
  Filled 2021-05-14 (×7): qty 1

## 2021-05-14 MED ORDER — LEVETIRACETAM IN NACL 500 MG/100ML IV SOLN
500.0000 mg | Freq: Once | INTRAVENOUS | Status: AC
  Administered 2021-05-15: 500 mg via INTRAVENOUS
  Filled 2021-05-14: qty 100
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
## 2021-05-14 NOTE — Procedures (Signed)
History: 88 yo F being evaluated for encephaloapthy ? ?Sedation: none ? ?Technique: This EEG was acquired with electrodes placed according to the International 10-20 electrode system (including Fp1, Fp2, F3, F4, C3, C4, P3, P4, O1, O2, T3, T4, T5, T6, A1, A2, Fz, Cz, Pz). The following electrodes were missing or displaced: none. ? ? ?Background: There is a poorly formed and poorly sustained posterior dominant rhythm of 8 Hz.  This is seen better on the left than right.  There is significant intrusion of irregular delta and theta range activities throughout the recording.  There is also attenuation of faster frequencies on the right compared to the left. ? ?Photic stimulation: Physiologic driving is present ? ?EEG Abnormalities: 1) generalized irregular slow activity ?2) attenuation of normal faster frequencies on the right ? ?Clinical Interpretation: This EEG is consistent with a focal right hemispheric dysfunction consistent with the patient's known stroke in the setting of a generalized non-specific cerebral dysfunction(encephalopathy).  ? ?There was no seizure or seizure predisposition recorded on this study. Please note that lack of epileptiform activity on EEG does not preclude the possibility of epilepsy.  ? ?Roland Rack, MD ?[facility] ?[phone] ? ?If 7pm- 7am, please page neurology on call as listed in [place]. ? ?

## 2021-05-14 NOTE — Progress Notes (Signed)
Eeg done 

## 2021-05-14 NOTE — TOC Progression Note (Signed)
Transition of Care (TOC) - Progression Note  ? ? ?Patient Details  ?Name: Sonya [name] ?MRN: [id number] ?Date of Birth: 02-03-1933 ? ?Transition of Care (TOC) CM/SW Contact  ?Alberteen Sam, LCSW ?Phone Number: ?05/14/2021, 9:58 AM ? ?Clinical Narrative:    ? ?Patient has long term bed at [facility] once medically cleared to dc.  ? ?Per MD patient continues to be lethargic, EEG ordered.  ? ?TOC will continue to follow for patient's medical readiness to dc to long term bed at [facility] [place] [facility] [place] updated on above.  ? ? ? ?Expected Discharge Plan: [place] ?Barriers to Discharge: Continued Medical Work up ? ?Expected Discharge Plan and Services ?Expected Discharge Plan: [place] ?  ?  ?  ?  ?                ?  ?  ?  ?  ?  ?  ?  ?  ?  ?  ? ? ?Social Determinants of Health (SDOH) Interventions ?  ? ?Readmission Risk Interventions ?   ? View : No data to display.  ?  ?  ?  ? ? ?

## 2021-05-14 NOTE — Progress Notes (Signed)
PT Cancellation Note ? ?Patient Details ?Name: Sonya [name] ?MRN: [id number] ?DOB: [birth year] ? ? ?Cancelled Treatment:    Reason Eval/Treat Not Completed: Fatigue/lethargy limiting ability to participate.  Chart reviewed and attempted to see pt.  Pt unable to communicate effectively with pt stating "help" but unable to verbalize any concern or why she needed help.  Therapist attempted to move pt's leg and pt consistently stating "help".  Will re-attempt as medically necessary at later date/time as medically appropriate. ? ? ? ?Gwenlyn Saran, PT, DPT ?05/14/21, 1:53 PM ? ?

## 2021-05-14 NOTE — Care Management Important Message (Signed)
Important Message ? ?Patient Details  ?Name: Sonya [name] ?MRN: [id number] ?Date of Birth: 1932-07-01 ? ? ?Medicare Important Message Given:  Yes ? ?Reviewed Medicare IM with Annabell Howells, grandson, at [phone]. Copy of Medicare IM left in patient's room for reference.   ? ? ? ?Dannette Barbara ?05/14/2021, 10:59 AM ?

## 2021-05-14 NOTE — Progress Notes (Addendum)
2045: Patient lethargic at this time. Repositioned patient and attempted to arouse her, she continued to sleep despite attempts.  ?2200: Returned to patient's room. She is much more alert at this time. Attempted to give her only applesauce to see how she would tolerate it before meds. Patient only opened mouth slightly and applesauce would only sit on her bottom lip.  Valetta Fuller, NP notified of unsuccessful attempts to administer medications. ?

## 2021-05-15 DIAGNOSIS — R410 Disorientation, unspecified: Secondary | ICD-10-CM | POA: Diagnosis not present

## 2021-05-15 LAB — COMPREHENSIVE METABOLIC PANEL
ALT: 11 U/L (ref 0–44)
AST: 19 U/L (ref 15–41)
Albumin: 2.5 g/dL — ABNORMAL LOW (ref 3.5–5.0)
Alkaline Phosphatase: 53 U/L (ref 38–126)
Anion gap: 9 (ref 5–15)
BUN: 14 mg/dL (ref 8–23)
CO2: 27 mmol/L (ref 22–32)
Calcium: 8.7 mg/dL — ABNORMAL LOW (ref 8.9–10.3)
Chloride: 99 mmol/L (ref 98–111)
Creatinine, Ser: 0.68 mg/dL (ref 0.44–1.00)
GFR, Estimated: >60 mL/min (ref >60)
Glucose, Bld: 102 mg/dL — ABNORMAL HIGH (ref 70–99)
Potassium: 3.7 mmol/L (ref 3.5–5.1)
Sodium: 135 mmol/L (ref 135–145)
Total Bilirubin: 0.7 mg/dL (ref 0.3–1.2)
Total Protein: 5 g/dL — ABNORMAL LOW (ref 6.5–8.1)

## 2021-05-15 LAB — CBC
HCT: 34.6 % — ABNORMAL LOW (ref 36.0–46.0)
Hemoglobin: 10.9 g/dL — ABNORMAL LOW (ref 12.0–15.0)
MCH: 28.5 pg (ref 26.0–34.0)
MCHC: 31.5 g/dL (ref 30.0–36.0)
MCV: 90.6 fL (ref 80.0–100.0)
Platelets: 341 10*3/uL (ref 150–400)
RBC: 3.82 MIL/uL — ABNORMAL LOW (ref 3.87–5.11)
RDW: 15 % (ref 11.5–15.5)
WBC: 12.4 10*3/uL — ABNORMAL HIGH (ref 4.0–10.5)
nRBC: 0 % (ref 0.0–0.2)

## 2021-05-15 NOTE — Progress Notes (Signed)
Nutrition Follow-up ? ?DOCUMENTATION CODES:  ? ?Underweight, Severe malnutrition in context of chronic illness ? ?INTERVENTION:  ?-Ensure Enlive po TID, each supplement provides 350 kcal and 20 grams of protein ?-MVI with minerals daily ?-Feeding assistance with meals ?-Ice cream with meals ?-500 mg vitamin C BID ?-220 mg zinc sulfate daily x 8 days ?- Request updated weight ?- Recommend initiation of tube feeding if aligns with GOC; reached out to MD  ? ?NUTRITION DIAGNOSIS:  ? ?Severe Malnutrition related to chronic illness (CVA) as evidenced by percent weight loss, moderate fat depletion, severe fat depletion, moderate muscle depletion, severe muscle depletion. ? ?Ongoing ? ?GOAL:  ? ?Patient will meet greater than or equal to 90% of their needs ? ?Not being met-addressing needs via meals and addition of nutrition supplements ? ?MONITOR:  ? ?PO intake, Supplement acceptance, Diet advancement, Labs, Weight trends, Skin, I & O's ? ?REASON FOR ASSESSMENT:  ? ?Malnutrition Screening Tool ?  ? ?ASSESSMENT:  ? ?Ms. Demetra Moya is a 88 year old female with history of CAD status post PCI in 1997, history of CVA with left-sided deficits, spastic hemiplegia of the left/nondominant side, multiple DVTs, currently on Eliquis, history of nontraumatic intraparenchymal hemorrhage of the brain, history of seizures, hypertension, hyperlipidemia, depression, anxiety, GERD, debility, who presents emergency department from [facility] clinic cardiology for chief concerns of urinary problems and placement. ? ?No family present at bedside. Pt sleeping. Opened eyes to her name then went back to sleep. ? ?Meal completions documented 0% x 6 meals. Pt had 50% of breakfast this morning. Observed lunch tray, uneaten on bedside table. Spoke with RN who states pt ate well this morning which is an improvement from yesterday, however she did not want lunch or a nutrition supplement today. ? ?No recent weight to review. Will request updated  weight. ? ?Reviewed I/O's: +3.0L since admission ?  ?Medications: vitamin c, vitamin d, colace, MVI, zinc ? ?Labs reviewed ? ?Diet Order:   ?Diet Order   ? ?       ?  DIET - DYS 1 Room service appropriate? Yes with Assist; Fluid consistency: Thin  Diet effective now       ?  ? ?  ?  ? ?  ? ? ?EDUCATION NEEDS:  ? ?Education needs have been addressed ? ?Skin:  Skin Assessment: Skin Integrity Issues: ?Skin Integrity Issues:: Stage IV, Stage II ?Stage II: buttocks ?Stage IV: lt elbow ? ?Last BM:  05/09/21 ? ?Height:  ? ?Ht Readings from Last 1 Encounters:  ?05/08/21 5' (1.524 m)  ? ? ?Weight:  ? ?Wt Readings from Last 1 Encounters:  ?05/08/21 38.6 kg  ? ? ?Ideal Body Weight:  45.4 kg ? ?BMI:  Body mass index is 16.62 kg/m?. ? ?Estimated Nutritional Needs:  ? ?Kcal:  1400-1600 ? ?Protein:  75-90 grams ? ?Fluid:  > 1.4 L ? ?Clayborne Dana, RDN, LDN ?Clinical Nutrition ?

## 2021-05-15 NOTE — Progress Notes (Signed)
Speech Language Pathology Treatment: Dysphagia  ?Patient Details ?Name: Sonya [name] ?MRN: [id number] ?DOB: 12-Sep-1932 ?Today's Date: 05/15/2021 ?Time: 1005-1050 ?SLP Time Calculation (min) (ACUTE ONLY): 45 min ? ?Assessment / Plan / Recommendation ?Clinical Impression ? Pt seen for ongoing assessment of toleration of oral diet(puree, thins); trials of po's; education w/ Grandson present.   ?Of Note: Per chart review, pt with hx of oral dysphagia. Pt last seen by SLP, 06/26/18, with recommendation then for a pureed diet with thin liquids. In the admitting H&P "Her grandson at bedside states the patient has been having poor p.o. intake in the last several weeks.  He states that has to cut up her food into small chunks of and or make the food really soft for her to swallow.".   ?Noted the Dietician's report of SEVERE MALNUTRITION. Noted BMI.  ? ?OF NOTE: Pt's Head CT this admit reveals -- "areas of decreased attenuation are noted throughout the deep and periventricular white matter of the cerebral hemispheres bilaterally, compatible with chronic microvascular ischemic disease. Encephalomalacia of the right frontal, parietal, temporal, occipital lobes likely due to prior right middle cerebral artery infarction.". ANY Cognitive decline/Dementia can impact overall awareness/timing of swallow and safety during po tasks which increases risk for aspiration, choking. Currently, pt's risk for aspiration can be reduced when following general aspiration precautions, Supervision w/ oral intake to check for clearing, and when using a modified diet consistency w/ puree foods as was recommended in 2020.  ?  ?Pt has L upper denture in place, sparse lower dentition, min xerostomia (dried, peeling lips), and poor oral health/dental hygiene. Oral care provided during session.  ?Pt given trials of pureed and thin liquids (via straw) w/ cues given by Grandson and this clinician to "open mouth" to take the straw. Pt did not attempt to  engage in feeding self. Pt presented w/ s/s of MOD+ oral dyspahgia including: lengthy oral holding and delayed oral transit w/ puree and thin liquids when distracted by environment despite MOD++ cues given. She appeared to clear orally upon eventually swallowing as No oral residue noted when pt opened mouth to speak post trials. Oral phase deficits likely impacted by declined Cognitive status. Pt's ability to meet her nutrition/hydration needs could be significantly impacted by pt's oral phase dysphagia and Cognitive presentation. ?  ?Pt is at an increased risk for aspiration/aspiration PNA given Cognitive decline, dental status, medical comorbidites (including stroke, GERD), and need for FULL assistance w/ feeding. Also, such decline in status was noted weeks prior to admit per Grandson's report.    ? ? ?Recommend continue a Pureed, moistened foods; Thin liquids. Recommend general aspiration precautions, checking for oral clearing. Pills crushed/whole in Puree for safer, easier swallowing. Pt will need assistance with oral care/denture care and FULL SUPERVISION W/ ALL ORAL INTAKE d/t Cognitive status.  ? ?Education given on above recommendations; aspiration precautions and encouragement to take/try more po intake at each meal; checking for oral clearing w/ Grandson present. Unsure of pt's full Cognitive understanding and comprehension. Precautions posted. MD/NSG updated and will reconsult if any new needs arise. Recommend a Palliative Care consult for further education on the impact of Cognitive decline on oral intake, swallowing, and meeting nutrition/hydration needs.  MD made aware.  ? ? ? ?  ?HPI HPI: Per H&P "Sonya [name] is a 88 year old female with history of CAD status post PCI in 1997, history of CVA with left-sided deficits, spastic hemiplegia of the left/nondominant side, multiple DVTs, currently on Eliquis, history of nontraumatic  intraparenchymal hemorrhage of the brain, history of seizures,  hypertension, hyperlipidemia, depression, anxiety, GERD, debility, who presents emergency department from [facility] clinic cardiology for chief concerns of urinary problems and placement.     Initial vitals in the emergency department showed temperature of 97.9, respiration rate of 20, heart rate 93, blood pressure 175/82, increased to 190/99, SPO2 of 97% on room air.    Serum sodium 139, potassium 2.8, chloride 95, bicarb 30, BUN of 20, serum creatinine of 0.86, nonfasting blood glucose 128, WBC 13.1, hemoglobin 13.6, platelets of 315.     Magnesium was 1.6.     UA was negative for leukocytes and nitrites.  Positive for hemoglobin.     ED treatment: Magnesium 2 g IV, potassium chloride 10 mill equivalent x2, sodium chloride 1 L bolus.     At bedside she is able to tell me her name, her current location of hospital, and the current month.  She has difficulty with here as she states she has been bedbound since 2019.  She states that she is in her 61s, 17 or 67.     She endorses no acute complaints.  She states that she has a hemiplegia and neuropathy pain.  She states when she has the nerve pain, she shakes all over and she is aware that she is having the pain and she is talking at the same time.  She endorses compliance with her seizure medication.    Her grandson Jill Alexanders is at bedside with her permission.     Her grandson at bedside states the patient has been having poor p.o. intake in the last several weeks.  He states that she has had difficulty swallowing though she denies this.  He states that he has had to cut up her food into small chunks of and or make the food really soft for her to swallow.  She adamantly denies all of these stating that she can eat just fine.     She endorses a chronic cough with white phlegm.  She denies fever, chest pain, shortness of breath, abdominal pain, dysuria, hematuria, diarrhea, weakness, nausea, vomiting, weight changes.     Social history: She is a widow and lives with her  son. She has grandson help occasionally. She is a former tobacco user and quit a long time ago. She denies etoh and recreational drug use. 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  ?   ?SLP Plan ? All goals met ? ?  ?  ?Recommendations for follow up therapy are one component of a multi-disciplinary discharge planning process, led by the attending physician.  Recommendations may be updated based on patient status, additional functional criteria and insurance authorization. ?  ? ?Recommendations  ?Diet recommendations: Dysphagia 1 (puree);Thin liquid ?Liquids provided via: Cup;Straw (monitor) ?Medication Administration: Crushed with puree ?Supervision: Staff to assist with self feeding;Full supervision/cueing for compensatory strategies ?Compensations: Minimize environmental distractions;Slow rate;Small sips/bites;Lingual sweep for clearance of pocketing;Follow solids with liquid (check for oral clearing b/t bites/at end of meals) ?Postural Changes and/or Swallow Maneuvers: Out of bed for meals;Seated upright 90 degrees;Upright 30-60 min after meal  ?   ?    ?   ? ? ? ? General recommendations:  (Palliative Care f/u for GOC; support w/ education on Cognitive decline and its impact on oral intake) ?Oral Care Recommendations: Oral care BID;Oral care before and after PO;Staff/trained caregiver to provide oral care ?Follow Up Recommendations: Follow physician's recommendations for  discharge plan and follow up therapies (at SNF) ?Assistance recommended at discharge: Frequent or constant Supervision/Assistance ?SLP Visit Diagnosis: Dysphagia, oral phase (R13.11) ?Plan: All goals met ? ? ? ? ?  ?  ? ? ? ? ? ?Jerilynn Som, MS, CCC-SLP ?Speech Language Pathologist ?Rehab Services; [facility], [place] [facility] [place] ?[phone] (ascom) ? ?Alakai Macbride ? ?05/15/2021, 4:25 PM ?

## 2021-05-15 NOTE — Progress Notes (Signed)
Physical Therapy Treatment ?Patient Details ?Name: Sonya [name] ?MRN: [id number] ?DOB: 24-May-1932 ?Today's Date: 05/15/2021 ? ? ?History of Present Illness Patient is an 88 year old female with history of CAD status post PCI, CVA with left-sided deficits, spastic hemiplegia of the left/nondominant side, multiple DVTs, currently on Eliquis, history of nontraumatic intraparenchymal hemorrhage of the brain, seizures, hypertension, hyperlipidemia, depression, anxiety, GERD, debility, who presents emergency department from [facility] clinic cardiology for chief concerns of urinary problems and placement. ? ?  ?PT Comments  ? ? Pt received supine in bed with grandson at bedside. Minimal verbal interaction but pt awake and alert. Tolerating BLE PROM in supine at hips, knees, and ankles. RLE tightness > LLE tightness despite documented L sided spasticity. Pt total assist to sit EOB and maxA at torso to maintain seated balance for skin integrity and core control/stability. Pt unable to follow commands to attempt RUE support on bedside. Pt tolerating ~2 min before returning to supine with total A and total +2 to scoot up in bed. Heels and LUE offloaded with pillows. Fall precautions in place with all needs in reach. RN updated. D/c recs remain appropriate.  ?   ?Recommendations for follow up therapy are one component of a multi-disciplinary discharge planning process, led by the attending physician.  Recommendations may be updated based on patient status, additional functional criteria and insurance authorization. ? ?Follow Up Recommendations ? Skilled nursing-short term rehab (<3 hours/day) ?  ?  ?Assistance Recommended at Discharge Frequent or constant Supervision/Assistance  ?Patient can return home with the following Two people to help with walking and/or transfers;Two people to help with bathing/dressing/bathroom;Assistance with cooking/housework;Direct supervision/assist for financial management;Direct supervision/assist  for medications management;Assist for transportation;Help with stairs or ramp for entrance ?  ?Equipment Recommendations ? None recommended by PT  ?  ?Recommendations for Other Services   ? ? ?  ?Precautions / Restrictions Precautions ?Precautions: Fall ?Restrictions ?Weight Bearing Restrictions: No  ?  ? ?Mobility ? Bed Mobility ?Overal bed mobility: Needs Assistance ?Bed Mobility: Supine to Sit, Sit to Supine ?  ?  ?Supine to sit: Total assist ?Sit to supine: Total assist ?  ?General bed mobility comments: total assist ?Patient Response: Flat affect ? ?Transfers ?  ?  ?  ?  ?  ?  ?  ?  ?  ?General transfer comment: unable/unsafe to attempt ?  ? ?Ambulation/Gait ?  ?  ?  ?  ?  ?  ?  ?  ? ? ?Stairs ?  ?  ?  ?  ?  ? ? ?Wheelchair Mobility ?  ? ?Modified Rankin (Stroke Patients Only) ?  ? ? ?  ?Balance Overall balance assessment: Needs assistance, History of Falls ?Sitting-balance support: Feet unsupported, No upper extremity supported ?Sitting balance-Leahy Scale: Zero ?Sitting balance - Comments: maxA to maintain seated balance. Becomes more alert in sitting, smiles and able to hold head up with encouragement. ?Postural control: Posterior lean ?  ?  ?  ?  ?  ?  ?  ?  ?  ?  ?  ?  ?  ?  ?  ? ?  ?Cognition Arousal/Alertness: Awake/alert ?Behavior During Therapy: Flat affect ?Overall Cognitive Status: Impaired/Different from baseline ?Area of Impairment: Following commands ?  ?  ?  ?  ?  ?  ?  ?  ?  ?  ?  ?Following Commands: Follows one step commands inconsistently ?  ?  ?  ?General Comments: very limited verbal interaction, does not follow commands. Per grand  son in room pt's cognition still far from baseline. ?  ?  ? ?  ?Exercises General Exercises - Lower Extremity ?Ankle Circles/Pumps: PROM, Both, 10 reps, Supine ?Heel Slides: PROM, Both, 10 reps, Supine ?Hip ABduction/ADduction: PROM, Supine, Both, 10 reps ?Hip Flexion/Marching: PROM, Both, 10 reps, Supine ? ?  ?General Comments General comments (skin integrity,  edema, etc.): HR trending mid 70's BPM ?  ?  ? ?Pertinent Vitals/Pain Pain Assessment ?Pain Assessment: Faces ?Faces Pain Scale: No hurt  ? ? ?Home Living   ?  ?  ?  ?  ?  ?  ?  ?  ?  ?   ?  ?Prior Function    ?  ?  ?   ? ?PT Goals (current goals can now be found in the care plan section) Acute Rehab PT Goals ?Patient Stated Goal: family goal is to get to rehab ?PT Goal Formulation: With family ?Time For Goal Achievement: 05/23/21 ?Potential to Achieve Goals: Fair ?Progress towards PT goals: Progressing toward goals ? ?  ?Frequency ? ? ? Min 2X/week ? ? ? ?  ?PT Plan Current plan remains appropriate  ? ? ?Co-evaluation   ?  ?  ?  ?  ? ?  ?AM-PAC PT "6 Clicks" Mobility   ?Outcome Measure ? Help needed turning from your back to your side while in a flat bed without using bedrails?: Total ?Help needed moving from lying on your back to sitting on the side of a flat bed without using bedrails?: Total ?Help needed moving to and from a bed to a chair (including a wheelchair)?: Total ?Help needed standing up from a chair using your arms (e.g., wheelchair or bedside chair)?: Total ?Help needed to walk in hospital room?: Total ?Help needed climbing 3-5 steps with a railing? : Total ?6 Click Score: 6 ? ?  ?End of Session   ?Activity Tolerance: Patient limited by lethargy ?Patient left: in bed;with call bell/phone within reach;with bed alarm set;with family/visitor present ?Nurse Communication: Mobility status ?PT Visit Diagnosis: Muscle weakness (generalized) (M62.81);Unsteadiness on feet (R26.81);Other abnormalities of gait and mobility (R26.89) ?  ? ? ?Time: 9163-8466 ?PT Time Calculation (min) (ACUTE ONLY): 24 min ? ?Charges:  $Therapeutic Activity: 23-37 mins          ?          ? ?Salem Caster. Fairly IV, PT, DPT ?Physical Therapist- [place]  ?[facility]  ?05/15/2021, 12:00 PM ? ?
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
## 2021-05-16 DIAGNOSIS — Z9189 Other specified personal risk factors, not elsewhere classified: Secondary | ICD-10-CM

## 2021-05-16 DIAGNOSIS — R400 Somnolence: Secondary | ICD-10-CM

## 2021-05-16 DIAGNOSIS — R410 Disorientation, unspecified: Secondary | ICD-10-CM | POA: Diagnosis not present

## 2021-05-16 LAB — CBC
HCT: 32.8 % — ABNORMAL LOW (ref 36.0–46.0)
Hemoglobin: 10.5 g/dL — ABNORMAL LOW (ref 12.0–15.0)
MCH: 29.1 pg (ref 26.0–34.0)
MCHC: 32 g/dL (ref 30.0–36.0)
MCV: 90.9 fL (ref 80.0–100.0)
Platelets: 333 10*3/uL (ref 150–400)
RBC: 3.61 MIL/uL — ABNORMAL LOW (ref 3.87–5.11)
RDW: 14.9 % (ref 11.5–15.5)
WBC: 11.6 10*3/uL — ABNORMAL HIGH (ref 4.0–10.5)
nRBC: 0 % (ref 0.0–0.2)

## 2021-05-16 LAB — BASIC METABOLIC PANEL
Anion gap: 8 (ref 5–15)
BUN: 14 mg/dL (ref 8–23)
CO2: 25 mmol/L (ref 22–32)
Calcium: 8.6 mg/dL — ABNORMAL LOW (ref 8.9–10.3)
Chloride: 102 mmol/L (ref 98–111)
Creatinine, Ser: 0.75 mg/dL (ref 0.44–1.00)
GFR, Estimated: >60 mL/min (ref >60)
Glucose, Bld: 101 mg/dL — ABNORMAL HIGH (ref 70–99)
Potassium: 3.5 mmol/L (ref 3.5–5.1)
Sodium: 135 mmol/L (ref 135–145)

## 2021-05-16 LAB — MAGNESIUM: Magnesium: 1.8 mg/dL (ref 1.7–2.4)

## 2021-05-16 MED ORDER — LISINOPRIL 10 MG PO TABS
10.0000 mg | ORAL_TABLET | Freq: Every day | ORAL | Status: DC
  Administered 2021-05-22: 10 mg via ORAL
  Filled 2021-05-16 (×4): qty 1

## 2021-05-16 MED ORDER — LEVETIRACETAM IN NACL 500 MG/100ML IV SOLN
500.0000 mg | Freq: Two times a day (BID) | INTRAVENOUS | Status: DC
  Administered 2021-05-16 – 2021-05-22 (×12): 500 mg via INTRAVENOUS
  Filled 2021-05-16 (×12): qty 100

## 2021-05-16 MED ORDER — KETOROLAC TROMETHAMINE 15 MG/ML IJ SOLN
15.0000 mg | Freq: Three times a day (TID) | INTRAMUSCULAR | Status: AC | PRN
  Administered 2021-05-16 – 2021-05-21 (×5): 15 mg via INTRAVENOUS
  Filled 2021-05-16 (×5): qty 1

## 2021-05-16 MED ORDER — METOPROLOL TARTRATE 50 MG PO TABS
50.0000 mg | ORAL_TABLET | Freq: Two times a day (BID) | ORAL | Status: DC
  Administered 2021-05-17 – 2021-05-22 (×3): 50 mg via ORAL
  Filled 2021-05-16 (×6): qty 1

## 2021-05-16 NOTE — Assessment & Plan Note (Addendum)
--  pt has been sleeping and difficult to wake for several days now.  Not eating or drinking much, and could not take her scheduled meds. ?--could be progression of her likely vascular dementia. ?Plan: ?--ongoing goals of care discussion ?

## 2021-05-16 NOTE — Progress Notes (Signed)
OT Cancellation Note ? ?Patient Details ?Name: Sonya [name] ?MRN: [id number] ?DOB: [birth year] ? ? ?Cancelled Treatment:    Reason Eval/Treat Not Completed: Patient's level of consciousness. MD agreeable to therapy attempting. Upon attempt, grandson present and reporting pt is not responsive and declines therapy. Grandson agreeable to OT/PT following in the background and re-attempting in the future if pt is more appropriate. Palliative care consult is pending.  ? ?Ardeth Perfect., MPH, MS, OTR/L ?ascom [phone] ?05/16/21, 11:45 AM ? ?
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
## 2021-05-16 NOTE — Progress Notes (Signed)
PT Cancellation Note ? ?Patient Details ?Name: Sonya [name] ?MRN: [id number] ?DOB: [birth year] ? ? ?Cancelled Treatment:    Reason Eval/Treat Not Completed: Patient's level of consciousness. Per OT, pt continues to be lethargic and not responding to family and staff. Per grandson, wishing rehab services to follow from a distance and if pt becomes more interactive to intervene. Palliative consult pending. Will continue to follow.  ? ? ?Salem Caster. Fairly IV, PT, DPT ?Physical Therapist- [place]  ?[facility]  ?05/16/2021, 12:42 PM ?

## 2021-05-16 NOTE — Progress Notes (Signed)
Sonya [name] from [place], APS visited patient this evening to make staff and family aware that Sonya [name] has been clear to visit patient. Grandson at bedside. Her contact numbers are [phone] Cell, [phone] office. ?

## 2021-05-16 NOTE — Assessment & Plan Note (Addendum)
--  due to prolonged extreme somnolence.  Pt is more alert now, but still refused to take oral intake. ?--family confirmed no feeding tube.  Pt currently has no signs of dehydration. ? ?

## 2021-05-17 DIAGNOSIS — R410 Disorientation, unspecified: Secondary | ICD-10-CM | POA: Diagnosis not present

## 2021-05-17 LAB — CBC
HCT: 31.2 % — ABNORMAL LOW (ref 36.0–46.0)
Hemoglobin: 9.8 g/dL — ABNORMAL LOW (ref 12.0–15.0)
MCH: 28.8 pg (ref 26.0–34.0)
MCHC: 31.4 g/dL (ref 30.0–36.0)
MCV: 91.8 fL (ref 80.0–100.0)
Platelets: 348 10*3/uL (ref 150–400)
RBC: 3.4 MIL/uL — ABNORMAL LOW (ref 3.87–5.11)
RDW: 15.2 % (ref 11.5–15.5)
WBC: 11.4 10*3/uL — ABNORMAL HIGH (ref 4.0–10.5)
nRBC: 0 % (ref 0.0–0.2)

## 2021-05-17 LAB — BASIC METABOLIC PANEL
Anion gap: 9 (ref 5–15)
BUN: 16 mg/dL (ref 8–23)
CO2: 25 mmol/L (ref 22–32)
Calcium: 8.6 mg/dL — ABNORMAL LOW (ref 8.9–10.3)
Chloride: 104 mmol/L (ref 98–111)
Creatinine, Ser: 0.79 mg/dL (ref 0.44–1.00)
GFR, Estimated: >60 mL/min (ref >60)
Glucose, Bld: 75 mg/dL (ref 70–99)
Potassium: 3.8 mmol/L (ref 3.5–5.1)
Sodium: 138 mmol/L (ref 135–145)

## 2021-05-17 LAB — MAGNESIUM: Magnesium: 1.8 mg/dL (ref 1.7–2.4)

## 2021-05-17 MED ORDER — ENOXAPARIN SODIUM 80 MG/0.8ML IJ SOSY
1.5000 mg/kg | PREFILLED_SYRINGE | Freq: Every day | INTRAMUSCULAR | Status: DC
  Administered 2021-05-17 – 2021-05-22 (×5): 65 mg via SUBCUTANEOUS
  Filled 2021-05-17: qty 0.65
  Filled 2021-05-17: qty 0.8
  Filled 2021-05-17 (×2): qty 0.65
  Filled 2021-05-17 (×2): qty 0.8

## 2021-05-17 NOTE — Progress Notes (Signed)
Pt is a little more alert and was able to swallow her pills crushed in apple sauce ? ?

## 2021-05-17 NOTE — Progress Notes (Signed)
Nutrition Follow-up ? ?DOCUMENTATION CODES:  ? ?Underweight, Severe malnutrition in context of chronic illness ? ?INTERVENTION:  ? ?-Continue Ensure Enlive po TID, each supplement provides 350 kcal and 20 grams of protein ?-Continue MVI with minerals daily ?-Continue feeding assistance with meals ?-Continue ice cream with meals ?-Continue 500 mg vitamin C BID ?-Continue 220 mg zinc sulfate daily x 14 days ? ?NUTRITION DIAGNOSIS:  ? ?Severe Malnutrition related to chronic illness (CVA) as evidenced by percent weight loss, moderate fat depletion, severe fat depletion, moderate muscle depletion, severe muscle depletion. ? ?Ongoing ? ?GOAL:  ? ?Patient will meet greater than or equal to 90% of their needs ? ?Unmet ? ?MONITOR:  ? ?PO intake, Supplement acceptance, Diet advancement, Labs, Weight trends, Skin, I & O's ? ?REASON FOR ASSESSMENT:  ? ?Malnutrition Screening Tool ?  ? ?ASSESSMENT:  ? ?Sonya [name] is a 88 year old female with history of CAD status post PCI in 1997, history of CVA with left-sided deficits, spastic hemiplegia of the left/nondominant side, multiple DVTs, currently on Eliquis, history of nontraumatic intraparenchymal hemorrhage of the brain, history of seizures, hypertension, hyperlipidemia, depression, anxiety, GERD, debility, who presents emergency department from [facility] clinic cardiology for chief concerns of urinary problems and placement. ? ?Reviewed I/O's: -150 ml x 24 hours and +4.9 L since admission ? ?UOP: 150 ml x 24 hours ? ?Pt very lethargic and with minimal intake. Per MD notes, pt and family do not desire a feeding tube or palliative care consult. Plan to monitor pt's status within the next few days. Family to transition to comfort care if she continues to decline.  ?  ?Medications reviewed and include vitamin C, vitamin D, colace, melatonin, zinc sulfate, and IV keppra.  ? ?Labs reviewed: CBGS: 155 (inpatient orders for glycemic control are none).   ? ?Diet Order:   ?Diet  Order   ? ?       ?  DIET - DYS 1 Room service appropriate? Yes with Assist; Fluid consistency: Thin  Diet effective now       ?  ? ?  ?  ? ?  ? ? ?EDUCATION NEEDS:  ? ?Education needs have been addressed ? ?Skin:  Skin Assessment: Skin Integrity Issues: ?Skin Integrity Issues:: Stage I, Stage IV ?Stage I: upper L back ?Stage II: L buttock ?Stage IV: L elbow ? ?Last BM:  unknown ? ?Height:  ? ?Ht Readings from Last 1 Encounters:  ?05/08/21 5' (1.524 m)  ? ? ?Weight:  ? ?Wt Readings from Last 1 Encounters:  ?05/17/21 43 kg  ? ? ?Ideal Body Weight:  45.4 kg ? ?BMI:  Body mass index is 18.51 kg/m?. ? ?Estimated Nutritional Needs:  ? ?Kcal:  1400-1600 ? ?Protein:  75-90 grams ? ?Fluid:  > 1.4 L ? ? ? ?Loistine Chance, RD, LDN, CDCES ?Registered Dietitian II ?Certified Diabetes Care and Education Specialist ?Please refer to [facility] for RD and/or RD on-call/weekend/after hours pager  ?
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
## 2021-05-17 NOTE — Progress Notes (Signed)
Notified MD that pt is unable to swallow d/t lethargic and confused... No need for SLP eval since MD has spoken w/family and will treat elevated BP w/IV hydralazine.  ?

## 2021-05-18 DIAGNOSIS — R410 Disorientation, unspecified: Secondary | ICD-10-CM | POA: Diagnosis not present

## 2021-05-18 LAB — CBC
HCT: 33.1 % — ABNORMAL LOW (ref 36.0–46.0)
Hemoglobin: 10.5 g/dL — ABNORMAL LOW (ref 12.0–15.0)
MCH: 28.8 pg (ref 26.0–34.0)
MCHC: 31.7 g/dL (ref 30.0–36.0)
MCV: 90.9 fL (ref 80.0–100.0)
Platelets: 391 10*3/uL (ref 150–400)
RBC: 3.64 MIL/uL — ABNORMAL LOW (ref 3.87–5.11)
RDW: 14.8 % (ref 11.5–15.5)
WBC: 16.6 10*3/uL — ABNORMAL HIGH (ref 4.0–10.5)
nRBC: 0 % (ref 0.0–0.2)

## 2021-05-18 LAB — BASIC METABOLIC PANEL
Anion gap: 9 (ref 5–15)
BUN: 17 mg/dL (ref 8–23)
CO2: 26 mmol/L (ref 22–32)
Calcium: 8.6 mg/dL — ABNORMAL LOW (ref 8.9–10.3)
Chloride: 102 mmol/L (ref 98–111)
Creatinine, Ser: 0.7 mg/dL (ref 0.44–1.00)
GFR, Estimated: >60 mL/min (ref >60)
Glucose, Bld: 98 mg/dL (ref 70–99)
Potassium: 3.4 mmol/L — ABNORMAL LOW (ref 3.5–5.1)
Sodium: 137 mmol/L (ref 135–145)

## 2021-05-18 LAB — MAGNESIUM: Magnesium: 1.8 mg/dL (ref 1.7–2.4)

## 2021-05-18 MED ORDER — LABETALOL HCL 5 MG/ML IV SOLN
5.0000 mg | Freq: Four times a day (QID) | INTRAVENOUS | Status: DC
  Administered 2021-05-18 – 2021-05-19 (×5): 5 mg via INTRAVENOUS
  Filled 2021-05-18 (×5): qty 4

## 2021-05-18 MED ORDER — SODIUM CHLORIDE 0.9 % IV SOLN: INTRAVENOUS | Status: DC | PRN
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
## 2021-05-19 ENCOUNTER — Inpatient Hospital Stay: Payer: Medicare Other

## 2021-05-19 DIAGNOSIS — R410 Disorientation, unspecified: Secondary | ICD-10-CM | POA: Diagnosis not present

## 2021-05-19 MED ORDER — LABETALOL HCL 5 MG/ML IV SOLN
10.0000 mg | Freq: Four times a day (QID) | INTRAVENOUS | Status: DC
  Administered 2021-05-19 – 2021-05-22 (×11): 10 mg via INTRAVENOUS
  Filled 2021-05-19 (×9): qty 4
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
## 2021-05-20 DIAGNOSIS — Z7189 Other specified counseling: Secondary | ICD-10-CM

## 2021-05-20 DIAGNOSIS — R410 Disorientation, unspecified: Secondary | ICD-10-CM | POA: Diagnosis not present

## 2021-05-20 LAB — CBC
HCT: 31.5 % — ABNORMAL LOW (ref 36.0–46.0)
Hemoglobin: 10.1 g/dL — ABNORMAL LOW (ref 12.0–15.0)
MCH: 29.3 pg (ref 26.0–34.0)
MCHC: 32.1 g/dL (ref 30.0–36.0)
MCV: 91.3 fL (ref 80.0–100.0)
Platelets: 304 10*3/uL (ref 150–400)
RBC: 3.45 MIL/uL — ABNORMAL LOW (ref 3.87–5.11)
RDW: 14.9 % (ref 11.5–15.5)
WBC: 9.3 10*3/uL (ref 4.0–10.5)
nRBC: 0 % (ref 0.0–0.2)

## 2021-05-20 LAB — BASIC METABOLIC PANEL
Anion gap: 8 (ref 5–15)
BUN: 11 mg/dL (ref 8–23)
CO2: 27 mmol/L (ref 22–32)
Calcium: 8.3 mg/dL — ABNORMAL LOW (ref 8.9–10.3)
Chloride: 105 mmol/L (ref 98–111)
Creatinine, Ser: 0.57 mg/dL (ref 0.44–1.00)
GFR, Estimated: >60 mL/min (ref >60)
Glucose, Bld: 90 mg/dL (ref 70–99)
Potassium: 2.9 mmol/L — ABNORMAL LOW (ref 3.5–5.1)
Sodium: 140 mmol/L (ref 135–145)

## 2021-05-20 LAB — MAGNESIUM: Magnesium: 1.8 mg/dL (ref 1.7–2.4)

## 2021-05-20 MED ORDER — POTASSIUM CHLORIDE 10 MEQ/100ML IV SOLN
10.0000 meq | INTRAVENOUS | Status: AC
  Administered 2021-05-20 (×5): 10 meq via INTRAVENOUS
  Filled 2021-05-20 (×5): qty 100

## 2021-05-20 NOTE — Assessment & Plan Note (Addendum)
--  Pt has been declining for a while now prior to presentation, not eating enough and loosing weight.  Now pt hardly eats/drinks and refuses all oral meds.  Family confirmed no feeding tube.  Palliative care consult offered initially, but son declined due to "it making him feel bad."  I have been having ongoing goals of care discussion with grandson and son, with consideration of comfort care. ?

## 2021-05-20 NOTE — Progress Notes (Signed)
Physical Therapy Treatment ?Patient Details ?Name: Sonya [name] ?MRN: [id number] ?DOB: August 16, 1932 ?Today's Date: 05/20/2021 ? ? ?History of Present Illness Patient is an 88 year old female with history of CAD status post PCI, CVA with left-sided deficits, spastic hemiplegia of the left/nondominant side, multiple DVTs, currently on Eliquis, history of nontraumatic intraparenchymal hemorrhage of the brain, seizures, hypertension, hyperlipidemia, depression, anxiety, GERD, debility, who presents emergency department from [facility] clinic cardiology for chief concerns of urinary problems and placement. ? ?  ?PT Comments  ? ? Pt remains lethargic and does not awaken for session. Soft moaning during gentle ROM and stretch for pt comfort and skin integrity.  Mobility deferred. ?  ?Recommendations for follow up therapy are one component of a multi-disciplinary discharge planning process, led by the attending physician.  Recommendations may be updated based on patient status, additional functional criteria and insurance authorization. ? ?Follow Up Recommendations ? Skilled nursing-short term rehab (<3 hours/day) ?  ?  ?Assistance Recommended at Discharge Frequent or constant Supervision/Assistance  ?Patient can return home with the following Two people to help with walking and/or transfers;Two people to help with bathing/dressing/bathroom;Assistance with cooking/housework;Direct supervision/assist for financial management;Direct supervision/assist for medications management;Assist for transportation;Help with stairs or ramp for entrance ?  ?Equipment Recommendations ? None recommended by PT  ?  ?Recommendations for Other Services   ? ? ?  ?Precautions / Restrictions Precautions ?Precautions: Fall ?Restrictions ?Weight Bearing Restrictions: No  ?  ? ?Mobility ? Bed Mobility ?  ?  ?  ?  ?  ?  ?  ?General bed mobility comments: deferred ?  ? ?Transfers ?  ?  ?  ?  ?  ?  ?  ?  ?  ?  ?  ? ?Ambulation/Gait ?  ?  ?  ?  ?  ?  ?  ?   ? ? ?Stairs ?  ?  ?  ?  ?  ? ? ?Wheelchair Mobility ?  ? ?Modified Rankin (Stroke Patients Only) ?  ? ? ?  ?Balance   ?  ?  ?  ?  ?  ?  ?  ?  ?  ?  ?  ?  ?  ?  ?  ?  ?  ?  ?  ? ?  ?Cognition Arousal/Alertness: Lethargic ?Behavior During Therapy: Flat affect ?Overall Cognitive Status: Impaired/Different from baseline ?  ?  ?  ?  ?  ?  ?  ?  ?  ?  ?  ?  ?  ?  ?  ?  ?  ?  ?  ? ?  ?Exercises Other Exercises ?Other Exercises: BLE PROM and gentle stretching x 10 ? ?  ?General Comments   ?  ?  ? ?Pertinent Vitals/Pain Pain Assessment ?Pain Assessment: Faces ?Faces Pain Scale: Hurts little more ?Pain Location: grimmaces - general discomfort ?Pain Descriptors / Indicators: Discomfort, Dull, Grimacing ?Pain Intervention(s): Limited activity within patient's tolerance, Monitored during session, Repositioned  ? ? ?Home Living   ?  ?  ?  ?  ?  ?  ?  ?  ?  ?   ?  ?Prior Function    ?  ?  ?   ? ?PT Goals (current goals can now be found in the care plan section) Progress towards PT goals: Progressing toward goals ? ?  ?Frequency ? ? ? Min 2X/week ? ? ? ?  ?PT Plan Current plan remains appropriate  ? ? ?Co-evaluation   ?  ?  ?  ?  ? ?  ?  AM-PAC PT "6 Clicks" Mobility   ?Outcome Measure ? Help needed turning from your back to your side while in a flat bed without using bedrails?: Total ?Help needed moving from lying on your back to sitting on the side of a flat bed without using bedrails?: Total ?Help needed moving to and from a bed to a chair (including a wheelchair)?: Total ?Help needed standing up from a chair using your arms (e.g., wheelchair or bedside chair)?: Total ?Help needed to walk in hospital room?: Total ?Help needed climbing 3-5 steps with a railing? : Total ?6 Click Score: 6 ? ?  ?End of Session   ?Activity Tolerance: Patient limited by lethargy ?Patient left: in bed;with call bell/phone within reach;with bed alarm set ?Nurse Communication: Mobility status ?PT Visit Diagnosis: Muscle weakness (generalized)  (M62.81);Unsteadiness on feet (R26.81);Other abnormalities of gait and mobility (R26.89) ?  ? ? ?Time: 0768-0881 ?PT Time Calculation (min) (ACUTE ONLY): 8 min ? ?Charges:  $Therapeutic Exercise: 8-22 mins          ?         Chesley Noon, PTA ?05/20/21, 11:15 AM ? ?

## 2021-05-20 NOTE — Progress Notes (Signed)
Occupational Therapy Treatment ?Patient Details ?Name: Sonya [name] ?MRN: [id number] ?DOB: 1933-02-15 ?Today's Date: 05/20/2021 ? ? ?History of present illness Patient is an 88 year old female with history of CAD status post PCI, CVA with left-sided deficits, spastic hemiplegia of the left/nondominant side, multiple DVTs, currently on Eliquis, history of nontraumatic intraparenchymal hemorrhage of the brain, seizures, hypertension, hyperlipidemia, depression, anxiety, GERD, debility, who presents emergency department from [facility] clinic cardiology for chief concerns of urinary problems and placement. ?  ?OT comments ? Upon entering the room, pt supine in bed with eyes closed. Pt does not open eyes this session with grooming tasks and gentle PROM of UEs. She moans with ROM and repositioning in bed. Pt has not been able to actively participate in last two sessions and remains lethargic throughout. OT to sign off at this time as pt is not progressing.   ? ?Recommendations for follow up therapy are one component of a multi-disciplinary discharge planning process, led by the attending physician.  Recommendations may be updated based on patient status, additional functional criteria and insurance authorization. ?   ?Follow Up Recommendations ? Long-term institutional care without follow-up therapy  ?  ?Assistance Recommended at Discharge Frequent or constant Supervision/Assistance  ?Patient can return home with the following ? Two people to help with bathing/dressing/bathroom;Two people to help with walking and/or transfers;Assistance with feeding;Help with stairs or ramp for entrance;Assist for transportation;Direct supervision/assist for financial management;Direct supervision/assist for medications management ?  ?Equipment Recommendations ? None recommended by OT  ?  ?   ?Precautions / Restrictions Precautions ?Precautions: Fall ?Restrictions ?Weight Bearing Restrictions: No  ? ? ?  ? ?Mobility Bed Mobility ?Overal bed  mobility: Needs Assistance ?Bed Mobility: Rolling ?Rolling: Total assist ?  ?  ?  ?  ?  ?  ? ?Transfers ?  ?  ?  ?  ?  ?  ?  ?  ?  ?General transfer comment: deferred ?  ?  ?   ? ?ADL either performed or assessed with clinical judgement  ? ?ADL Overall ADL's : Needs assistance/impaired ?  ?  ?  ?  ?  ?  ?  ?  ?  ?  ?  ?  ?  ?  ?  ?  ?  ?  ?  ?General ADL Comments: total A for all tasks ?  ? ?Extremity/Trunk Assessment   ?  ?  ?  ?  ?  ? ?Vision Patient Visual Report: No change from baseline ?  ?  ?   ?   ? ?Cognition Arousal/Alertness: Lethargic ?Behavior During Therapy: Flat affect ?Overall Cognitive Status: Impaired/Different from baseline ?  ?  ?  ?  ?  ?  ?  ?  ?  ?  ?  ?  ?  ?  ?  ?  ?General Comments: Pt does not open eyes and moans when touched. ?  ?  ?   ?   ?   ?   ? ? ?Pertinent Vitals/ Pain       Pain Assessment ?Pain Assessment: Faces ?Faces Pain Scale: Hurts little more ?Pain Location: general ?Pain Descriptors / Indicators: Discomfort, Grimacing ?Pain Intervention(s): Limited activity within patient's tolerance, Monitored during session, Repositioned ? ?   ? ?Progress Toward Goals ? ?OT Goals(current goals can now be found in the care plan section) ? Progress towards OT goals: Not progressing toward goals - comment ? ?Acute Rehab OT Goals ?Patient Stated Goal: none stated ?OT Goal Formulation: Patient unable to  participate in goal setting ?Time For Goal Achievement: 05/23/21 ?Potential to Achieve Goals: Fair  ?Plan Discharge plan needs to be updated;Frequency remains appropriate;Other (comment) (discharge secondary to pt unable to participate)   ? ?   ?AM-PAC OT "6 Clicks" Daily Activity     ?Outcome Measure ? ? Help from another person eating meals?: Total ?Help from another person taking care of personal grooming?: Total ?Help from another person toileting, which includes using toliet, bedpan, or urinal?: Total ?Help from another person bathing (including washing, rinsing, drying)?: Total ?Help from  another person to put on and taking off regular upper body clothing?: Total ?Help from another person to put on and taking off regular lower body clothing?: Total ?6 Click Score: 6 ? ?  ?End of Session   ? ?OT Visit Diagnosis: Unsteadiness on feet (R26.81);Muscle weakness (generalized) (M62.81) ?  ?Activity Tolerance Patient limited by lethargy ?  ?Patient Left in bed;with call bell/phone within reach;with bed alarm set ?  ?Nurse Communication Mobility status ?  ? ?   ? ?Time: 1950-9326 ?OT Time Calculation (min): 10 min ? ?Charges: OT General Charges ?$OT Visit: 1 Visit ?OT Treatments ?$Therapeutic Activity: 8-22 mins ? ?Darleen Crocker, [place], OTR/L , CBIS ?ascom [phone]  ?05/20/21, 2:36 PM  ?
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
## 2021-05-21 DIAGNOSIS — Z7189 Other specified counseling: Secondary | ICD-10-CM

## 2021-05-21 LAB — POTASSIUM: Potassium: 4 mmol/L (ref 3.5–5.1)

## 2021-05-21 MED ORDER — ACETAMINOPHEN 650 MG RE SUPP
650.0000 mg | Freq: Four times a day (QID) | RECTAL | Status: DC | PRN
  Filled 2021-05-21: qty 1

## 2021-05-21 NOTE — TOC Progression Note (Signed)
Transition of Care (TOC) - Progression Note  ? ? ?Patient Details  ?Name: Sonya [name] ?MRN: [id number] ?Date of Birth: 09/24/1932 ? ?Transition of Care (TOC) CM/SW Contact  ?Macoy Rodwell A Khing Belcher, LCSW ?Phone Number: ?05/21/2021, 1:42 PM ? ?Clinical Narrative:   CSW called pts Grandson to discuss dc plan. Left vmail. ? ? ? ?Expected Discharge Plan: [place] ?Barriers to Discharge: Continued Medical Work up ? ?Expected Discharge Plan and Services ?Expected Discharge Plan: [place] ?  ?  ?  ?  ?                ?  ?  ?  ?  ?  ?  ?  ?  ?  ?  ? ? ?Social Determinants of Health (SDOH) Interventions ?  ? ?Readmission Risk Interventions ?   ? View : No data to display.  ?  ?  ?  ? ? ?
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
## 2021-05-21 NOTE — TOC Progression Note (Addendum)
Transition of Care (TOC) - Progression Note  ? ? ?Patient Details  ?Name: Sonya [name] ?MRN: [id number] ?Date of Birth: 08/10/1932 ? ?Transition of Care (TOC) CM/SW Contact  ?Lavena Loretto A Larnce Schnackenberg, LCSW ?Phone Number: ?05/21/2021, 2:09 PM ? ?Clinical Narrative:    ?CSW spoke with pt's son reguarding DC plan. [facility] did make a LTC bed offer on hub. CSW provided this information to pt's son and he is accepting this bed offer. Pt's son is also agreeable to hospice at [facility]. MD, Unit RN Director, and RN aware.  ? ?CSW notified [facility] of referral and  of dc tomorrow. ? ? ?Expected Discharge Plan: [place] ?Barriers to Discharge: Continued Medical Work up ? ?Expected Discharge Plan and Services ?Expected Discharge Plan: [place] ?  ?  ?  ?  ?                ?  ?  ?  ?  ?  ?  ?  ?  ?  ?  ? ? ?Social Determinants of Health (SDOH) Interventions ?  ? ?Readmission Risk Interventions ?   ? View : No data to display.  ?  ?  ?  ? ? ?

## 2021-05-22 LAB — CBC
HCT: 29.9 % — ABNORMAL LOW (ref 36.0–46.0)
Hemoglobin: 9.4 g/dL — ABNORMAL LOW (ref 12.0–15.0)
MCH: 29.1 pg (ref 26.0–34.0)
MCHC: 31.4 g/dL (ref 30.0–36.0)
MCV: 92.6 fL (ref 80.0–100.0)
Platelets: 302 10*3/uL (ref 150–400)
RBC: 3.23 MIL/uL — ABNORMAL LOW (ref 3.87–5.11)
RDW: 15.2 % (ref 11.5–15.5)
WBC: 7.4 10*3/uL (ref 4.0–10.5)
nRBC: 0 % (ref 0.0–0.2)

## 2021-05-22 LAB — BASIC METABOLIC PANEL
Anion gap: 4 — ABNORMAL LOW (ref 5–15)
BUN: 17 mg/dL (ref 8–23)
CO2: 27 mmol/L (ref 22–32)
Calcium: 8.4 mg/dL — ABNORMAL LOW (ref 8.9–10.3)
Chloride: 109 mmol/L (ref 98–111)
Creatinine, Ser: 0.48 mg/dL (ref 0.44–1.00)
GFR, Estimated: >60 mL/min (ref >60)
Glucose, Bld: 95 mg/dL (ref 70–99)
Potassium: 3.8 mmol/L (ref 3.5–5.1)
Sodium: 140 mmol/L (ref 135–145)

## 2021-05-22 LAB — MAGNESIUM: Magnesium: 1.8 mg/dL (ref 1.7–2.4)

## 2021-05-22 MED ORDER — ADULT MULTIVITAMIN W/MINERALS CH: 1.0000 | ORAL_TABLET | Freq: Every day | ORAL | Status: DC

## 2021-05-22 MED ORDER — METOPROLOL TARTRATE 50 MG PO TABS: 50.0000 mg | ORAL_TABLET | Freq: Two times a day (BID) | ORAL | Status: DC

## 2021-05-22 MED ORDER — LISINOPRIL 10 MG PO TABS: 10.0000 mg | ORAL_TABLET | Freq: Every day | ORAL | Status: DC

## 2021-05-22 NOTE — Care Management Important Message (Signed)
Important Message ? ?Patient Details  ?Name: Sonya [name] ?MRN: [id number] ?Date of Birth: [birth year] ? ? ?Medicare Important Message Given:  Yes ? ? ? ? ?Sonya [name] ?05/22/2021, 11:08 AM ?
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
## 2021-05-22 NOTE — Progress Notes (Signed)
Nutrition Follow-up ? ?DOCUMENTATION CODES:  ? ?Underweight, Severe malnutrition in context of chronic illness ? ?INTERVENTION:  ? ?-Continue Ensure Enlive po TID, each supplement provides 350 kcal and 20 grams of protein ?-Continue MVI with minerals daily ?-Continue feeding assistance with meals ?-Continue ice cream with meals ?-Continue 500 mg vitamin C BID ?-Continue 220 mg zinc sulfate daily x 14 days ? ?NUTRITION DIAGNOSIS:  ? ?Severe Malnutrition related to chronic illness (CVA) as evidenced by percent weight loss, moderate fat depletion, severe fat depletion, moderate muscle depletion, severe muscle depletion. ? ?Ongoing ? ?GOAL:  ? ?Patient will meet greater than or equal to 90% of their needs ? ?Unmet ? ?MONITOR:  ? ?PO intake, Supplement acceptance, Diet advancement, Labs, Weight trends, Skin, I & O's ? ?REASON FOR ASSESSMENT:  ? ?Malnutrition Screening Tool ?  ? ?ASSESSMENT:  ? ?[name] is a 88 year old female with history of CAD status post PCI in 1997, history of CVA with left-sided deficits, spastic hemiplegia of the left/nondominant side, multiple DVTs, currently on Eliquis, history of nontraumatic intraparenchymal hemorrhage of the brain, history of seizures, hypertension, hyperlipidemia, depression, anxiety, GERD, debility, who presents emergency department from [facility] clinic cardiology for chief concerns of urinary problems and placement. ? ?Reviewed I/O's: -100 ml x 24 hours and +3.7 L since admission ? ?UOP: 100 ml x 24 hours  ? ?Pt with waxing and waning mental status, but often lethargic. Oral intake continues to be poor; noted meal completions 10-30%. Pt consistently refused medications and spits them out. Family does not desire feeding tube or palliative care consult, but considering comfort care if pt continues to decline.  ? ?Per TOC notes, plan to d/c to SNF with hospice services.  ? ?Medications reviewed and include vitamin C, vitamin D, colace, zinc sulfate, and keppra.   ? ?Labs reviewed.  ? ?Diet Order:   ?Diet Order   ? ?       ?  DIET - DYS 1 Room service appropriate? Yes with Assist; Fluid consistency: Thin  Diet effective now       ?  ? ?  ?  ? ?  ? ? ?EDUCATION NEEDS:  ? ?Education needs have been addressed ? ?Skin:  Skin Assessment: Skin Integrity Issues: ?Skin Integrity Issues:: Stage I, Stage IV ?Stage I: upper L back ?Stage II: L buttock ?Stage IV: L elbow ? ?Last BM:  05/16/21 ? ?Height:  ? ?Ht Readings from Last 1 Encounters:  ?05/08/21 5' (1.524 m)  ? ? ?Weight:  ? ?Wt Readings from Last 1 Encounters:  ?05/21/21 39.5 kg  ? ? ?Ideal Body Weight:  45.4 kg ? ?BMI:  Body mass index is 17.01 kg/m?. ? ?Estimated Nutritional Needs:  ? ?Kcal:  1400-1600 ? ?Protein:  75-90 grams ? ?Fluid:  > 1.4 L ? ? ? ?Loistine Chance, RD, LDN, CDCES ?Registered Dietitian II ?Certified Diabetes Care and Education Specialist ?Please refer to [facility] for RD and/or RD on-call/weekend/after hours pager  ?

## 2021-05-22 NOTE — TOC Transition Note (Signed)
Transition of Care (TOC) - CM/SW Discharge Note ? ? ?Patient Details  ?Name: Sonya [name] ?MRN: [id number] ?Date of Birth: 06/12/1932 ? ?Transition of Care (TOC) CM/SW Contact:  ?Audriana Aldama A Johannah Rozas, LCSW ?Phone Number: ?05/22/2021, 10:56 AM ? ? ?Clinical Narrative:   Clinical Social Worker facilitated patient discharge including contacting patient family and facility to confirm patient discharge plans.  Clinical information faxed to facility and family agreeable with plan.  CSW arranged ambulance transport via [facility] to [facility] [address] .  RN to call [phone] for report prior to discharge. ? ? ? ? ? ? ?Final next level of care: [place] ?Barriers to Discharge: No Barriers Identified ? ? ?Patient Goals and CMS Choice ?Patient states their goals for this hospitalization and ongoing recovery are:: to go home ?CMS Medicare.gov Compare Post Acute Care list provided to:: Patient ?Choice offered to / list presented to : Patient ? ?Discharge Placement ?  ?           ?Patient chooses bed at:  ([facility]) ?Patient to be transferred to facility by: [facility] ?Name of family member notified: son ?Patient and family notified of of transfer: 05/22/21 ? ?Discharge Plan and Services ?  ?  ?           ?  ?  ?  ?  ?  ?  ?  ?  ?  ?  ? ?Social Determinants of Health (SDOH) Interventions ?  ? ? ?Readmission Risk Interventions ?   ? View : No data to display.  ?  ?  ?  ? ? ? ? ? ?
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
## 2021-12-16 ENCOUNTER — Encounter (INDEPENDENT_AMBULATORY_CARE_PROVIDER_SITE_OTHER): Payer: Self-pay

## 2022-03-08 ENCOUNTER — Other Ambulatory Visit: Payer: Self-pay

## 2022-03-08 ENCOUNTER — Emergency Department: Payer: Medicare Other

## 2022-03-08 ENCOUNTER — Inpatient Hospital Stay
Admission: EM | Admit: 2022-03-08 | Discharge: 2022-03-12 | DRG: 101 | Disposition: A | Discharge status: Skilled Nursing Facility | Payer: Medicare Other | Source: Skilled Nursing Facility | Attending: Student | Admitting: Student

## 2022-03-08 DIAGNOSIS — Z853 Personal history of malignant neoplasm of breast: Secondary | ICD-10-CM

## 2022-03-08 DIAGNOSIS — Z86718 Personal history of other venous thrombosis and embolism: Secondary | ICD-10-CM

## 2022-03-08 DIAGNOSIS — K219 Gastro-esophageal reflux disease without esophagitis: Secondary | ICD-10-CM | POA: Diagnosis present

## 2022-03-08 DIAGNOSIS — G309 Alzheimer's disease, unspecified: Secondary | ICD-10-CM | POA: Diagnosis present

## 2022-03-08 DIAGNOSIS — Z955 Presence of coronary angioplasty implant and graft: Secondary | ICD-10-CM

## 2022-03-08 DIAGNOSIS — Z79890 Hormone replacement therapy (postmenopausal): Secondary | ICD-10-CM

## 2022-03-08 DIAGNOSIS — T50916A Underdosing of multiple unspecified drugs, medicaments and biological substances, initial encounter: Secondary | ICD-10-CM | POA: Diagnosis present

## 2022-03-08 DIAGNOSIS — Z823 Family history of stroke: Secondary | ICD-10-CM

## 2022-03-08 DIAGNOSIS — F0390 Unspecified dementia without behavioral disturbance: Secondary | ICD-10-CM | POA: Insufficient documentation

## 2022-03-08 DIAGNOSIS — E039 Hypothyroidism, unspecified: Secondary | ICD-10-CM | POA: Diagnosis present

## 2022-03-08 DIAGNOSIS — Z79899 Other long term (current) drug therapy: Secondary | ICD-10-CM

## 2022-03-08 DIAGNOSIS — F0150 Vascular dementia without behavioral disturbance: Secondary | ICD-10-CM | POA: Diagnosis present

## 2022-03-08 DIAGNOSIS — L89322 Pressure ulcer of left buttock, stage 2: Secondary | ICD-10-CM | POA: Diagnosis present

## 2022-03-08 DIAGNOSIS — F0280 Dementia in other diseases classified elsewhere without behavioral disturbance: Secondary | ICD-10-CM | POA: Diagnosis present

## 2022-03-08 DIAGNOSIS — I10 Essential (primary) hypertension: Secondary | ICD-10-CM | POA: Diagnosis present

## 2022-03-08 DIAGNOSIS — G40909 Epilepsy, unspecified, not intractable, without status epilepticus: Secondary | ICD-10-CM

## 2022-03-08 DIAGNOSIS — R569 Unspecified convulsions: Principal | ICD-10-CM

## 2022-03-08 DIAGNOSIS — Z87891 Personal history of nicotine dependence: Secondary | ICD-10-CM

## 2022-03-08 DIAGNOSIS — I69354 Hemiplegia and hemiparesis following cerebral infarction affecting left non-dominant side: Secondary | ICD-10-CM

## 2022-03-08 DIAGNOSIS — I69398 Other sequelae of cerebral infarction: Secondary | ICD-10-CM

## 2022-03-08 DIAGNOSIS — I2510 Atherosclerotic heart disease of native coronary artery without angina pectoris: Secondary | ICD-10-CM | POA: Diagnosis present

## 2022-03-08 DIAGNOSIS — Z7901 Long term (current) use of anticoagulants: Secondary | ICD-10-CM

## 2022-03-08 DIAGNOSIS — R29810 Facial weakness: Secondary | ICD-10-CM | POA: Diagnosis present

## 2022-03-08 DIAGNOSIS — Z515 Encounter for palliative care: Secondary | ICD-10-CM

## 2022-03-08 DIAGNOSIS — N179 Acute kidney failure, unspecified: Secondary | ICD-10-CM | POA: Diagnosis present

## 2022-03-08 DIAGNOSIS — Z8249 Family history of ischemic heart disease and other diseases of the circulatory system: Secondary | ICD-10-CM

## 2022-03-08 DIAGNOSIS — Z66 Do not resuscitate: Secondary | ICD-10-CM | POA: Diagnosis present

## 2022-03-08 DIAGNOSIS — Z8041 Family history of malignant neoplasm of ovary: Secondary | ICD-10-CM

## 2022-03-08 DIAGNOSIS — Z888 Allergy status to other drugs, medicaments and biological substances status: Secondary | ICD-10-CM

## 2022-03-08 DIAGNOSIS — Z8619 Personal history of other infectious and parasitic diseases: Secondary | ICD-10-CM

## 2022-03-08 DIAGNOSIS — G40919 Epilepsy, unspecified, intractable, without status epilepticus: Secondary | ICD-10-CM | POA: Diagnosis present

## 2022-03-08 DIAGNOSIS — Z90710 Acquired absence of both cervix and uterus: Secondary | ICD-10-CM

## 2022-03-08 DIAGNOSIS — E860 Dehydration: Secondary | ICD-10-CM | POA: Diagnosis present

## 2022-03-08 DIAGNOSIS — Z91128 Patient's intentional underdosing of medication regimen for other reason: Secondary | ICD-10-CM

## 2022-03-08 DIAGNOSIS — E785 Hyperlipidemia, unspecified: Secondary | ICD-10-CM | POA: Diagnosis present

## 2022-03-08 LAB — HEPATIC FUNCTION PANEL
ALT: 15 U/L (ref 0–44)
AST: 16 U/L (ref 15–41)
Albumin: 3.3 g/dL — ABNORMAL LOW (ref 3.5–5.0)
Alkaline Phosphatase: 69 U/L (ref 38–126)
Bilirubin, Direct: <0.1 mg/dL (ref 0.0–0.2)
Total Bilirubin: 0.4 mg/dL (ref 0.3–1.2)
Total Protein: 5.9 g/dL — ABNORMAL LOW (ref 6.5–8.1)

## 2022-03-08 LAB — MAGNESIUM: Magnesium: 2.3 mg/dL (ref 1.7–2.4)

## 2022-03-08 LAB — BASIC METABOLIC PANEL
Anion gap: 8 (ref 5–15)
BUN: 42 mg/dL — ABNORMAL HIGH (ref 8–23)
CO2: 26 mmol/L (ref 22–32)
Calcium: 8.5 mg/dL — ABNORMAL LOW (ref 8.9–10.3)
Chloride: 107 mmol/L (ref 98–111)
Creatinine, Ser: 1.76 mg/dL — ABNORMAL HIGH (ref 0.44–1.00)
GFR, Estimated: 28 mL/min — ABNORMAL LOW (ref >60)
Glucose, Bld: 140 mg/dL — ABNORMAL HIGH (ref 70–99)
Potassium: 4.2 mmol/L (ref 3.5–5.1)
Sodium: 141 mmol/L (ref 135–145)

## 2022-03-08 LAB — CBC
HCT: 40.6 % (ref 36.0–46.0)
Hemoglobin: 12.5 g/dL (ref 12.0–15.0)
MCH: 28.9 pg (ref 26.0–34.0)
MCHC: 30.8 g/dL (ref 30.0–36.0)
MCV: 94 fL (ref 80.0–100.0)
Platelets: 221 10*3/uL (ref 150–400)
RBC: 4.32 MIL/uL (ref 3.87–5.11)
RDW: 12.8 % (ref 11.5–15.5)
WBC: 10.5 10*3/uL (ref 4.0–10.5)
nRBC: 0 % (ref 0.0–0.2)

## 2022-03-08 LAB — LACTIC ACID, PLASMA: Lactic Acid, Venous: 1.4 mmol/L (ref 0.5–1.9)

## 2022-03-08 LAB — CBG MONITORING, ED: Glucose-Capillary: 141 mg/dL — ABNORMAL HIGH (ref 70–99)

## 2022-03-08 MED ORDER — SODIUM CHLORIDE 0.9 % IV BOLUS
1000.0000 mL | Freq: Once | INTRAVENOUS | Status: AC
  Administered 2022-03-08: 1000 mL via INTRAVENOUS

## 2022-03-08 NOTE — ED Provider Notes (Signed)
[facility] Provider Note    Event Date/Time   First MD Initiated Contact with Patient 03/08/22 1904     (approximate)   History   Seizures   HPI  Sonya [name] is a 89 y.o. female   Past medical history of CAD, GERD, hypertension hyperlipidemia, hypothyroid, stroke with residual left-sided weakness bedbound status and dementia, seizures, nontraumatic IPH, from a facility, presents to the emergency department with seizure activity from her facility.  Reported around 15 minutes duration.  No other reported recent illnesses or trauma.  She arrives to the emergency department with her son at bedside who reports that her mother appears to be at baseline at this time mentation is normal and is communicative with son.  Independent Historian contributed to assessment above: Son  External Medical Documents Reviewed: Discharge summary dated May 22, 2021 that reviewed past medical history as above.  She was admitted for urinary complaints and placement/social.      Physical Exam   Triage Vital Signs: ED Triage Vitals  Enc Vitals Group     BP 03/08/22 1842 (!) 134/48     Pulse Rate 03/08/22 1842 60     Resp 03/08/22 1842 18     Temp 03/08/22 1848 98.1 F (36.7 C)     Temp Source 03/08/22 1848 Axillary     SpO2 03/08/22 1842 95 %     Weight --      Height --      Head Circumference --      Peak Flow --      Pain Score --      Pain Loc --      Pain Edu? --      Excl. in [place]? --     Most recent vital signs: Vitals:   03/08/22 2200 03/08/22 2300  BP: (!) 124/45 (!) 161/63  Pulse: (!) 58 68  Resp: 12 12  Temp:    SpO2: 97% 99%    General: Awake, no distress.  CV:  Good peripheral perfusion.  Resp:  Normal effort.  Abd:  No distention.  Other:  Mucous membranes are dry and skin turgor is poor but hemodynamics are appropriate and reassuring afebrile abdomen soft nontender lungs clear no obvious trauma.   ED Results / Procedures /  Treatments   Labs (all labs ordered are listed, but only abnormal results are displayed) Labs Reviewed  BASIC METABOLIC PANEL - Abnormal; Notable for the following components:      Result Value   Glucose, Bld 140 (*)    BUN 42 (*)    Creatinine, Ser 1.76 (*)    Calcium 8.5 (*)    GFR, Estimated 28 (*)    All other components within normal limits  HEPATIC FUNCTION PANEL - Abnormal; Notable for the following components:   Total Protein 5.9 (*)    Albumin 3.3 (*)    All other components within normal limits  CBG MONITORING, ED - Abnormal; Notable for the following components:   Glucose-Capillary 141 (*)    All other components within normal limits  CBC  LACTIC ACID, PLASMA  MAGNESIUM  LEVETIRACETAM LEVEL  LAMOTRIGINE LEVEL  URINALYSIS, ROUTINE W REFLEX MICROSCOPIC  TSH  T4, FREE     I ordered and reviewed the above labs they are notable for creatinine is 1.76 markedly elevated from below 1 prior.  EKG  ED ECG REPORT I, Lucillie Garfinkel, the attending physician, personally viewed and interpreted this ECG.   Date: 03/08/2022  EKG Time: 1921  Rate: 60  Rhythm: nsr  Axis: nl  Intervals:none  ST&T Change: No acute ischemic changes    RADIOLOGY I independently reviewed and interpreted CT of the head and see no acute bleeding or midline shift   PROCEDURES:  Critical Care performed: No  Procedures   MEDICATIONS ORDERED IN ED: Medications  sodium chloride 0.9 % bolus 1,000 mL (0 mLs Intravenous Stopped 03/08/22 2312)    External physician / consultants:  I spoke with hospitalist for admission and regarding care plan for this patient.   IMPRESSION / MDM / ASSESSMENT AND PLAN / ED COURSE  I reviewed the triage vital signs and the nursing notes.                                Patient's presentation is most consistent with acute presentation with potential threat to life or bodily function.  Differential diagnosis includes, but is not limited to, seizures, status  epilepticus, electrolyte derangement, dehydration, infection, head bleed or CVA   The patient is on the cardiac monitor to evaluate for evidence of arrhythmia and/or significant heart rate changes.  MDM: This is a patient with seizure and his seizure history I got basic labs to evaluate for differential diagnosis as above as well as antiepileptic levels which are still pending.  She has a significant AKI and looks dry got 1 L of fluids but has not urinated yet.  UA still pending.  Has not had further seizure activity so did not load on any antiepileptics.  Admit for AKI.        FINAL CLINICAL IMPRESSION(S) / ED DIAGNOSES   Final diagnoses:  Seizure ([place])  AKI (acute kidney injury) ([place])     Rx / DC Orders   ED Discharge Orders     None        Note:  This document was prepared using Dragon voice recognition software and may include unintentional dictation errors.    Lucillie Garfinkel, MD 03/08/22 [phone]

## 2022-03-08 NOTE — ED Notes (Signed)
Ferne Reus, son, is waiting in lobby with his son and would like to be updated when a decision is made as to whether patient is going to be admitted or not.

## 2022-03-08 NOTE — ED Triage Notes (Signed)
Pt to ED via [facility] from [facility] for a seizure. Pt has hx/o Brain Tumor, seizures, stroke. Pt had seizure this afternoon. Per staff at facility seizure lasted "15 minutes". Per EMS pt has not had any seizure like activity with them. Pt has been post itcal, only responds to pain. Pt sign have been stable with EMS. CBG 150.

## 2022-03-08 NOTE — ED Notes (Signed)
Report received. This RN now assuming care. This RN went and updated pt's son in the waiting room.

## 2022-03-08 NOTE — ED Notes (Signed)
MD Jacelyn Grip stated he is going to admit the pt. This RN went and spoke with family in the lobby to update them.

## 2022-03-09 ENCOUNTER — Observation Stay: Payer: Medicare Other

## 2022-03-09 DIAGNOSIS — G40919 Epilepsy, unspecified, intractable, without status epilepticus: Secondary | ICD-10-CM | POA: Diagnosis not present

## 2022-03-09 DIAGNOSIS — Z7901 Long term (current) use of anticoagulants: Secondary | ICD-10-CM

## 2022-03-09 LAB — T4, FREE: Free T4: 0.93 ng/dL (ref 0.61–1.12)

## 2022-03-09 LAB — TSH: TSH: 1.993 u[IU]/mL (ref 0.350–4.500)

## 2022-03-09 MED ORDER — LAMOTRIGINE 100 MG PO TABS
100.0000 mg | ORAL_TABLET | Freq: Two times a day (BID) | ORAL | Status: DC
  Filled 2022-03-09: qty 1

## 2022-03-09 MED ORDER — LEVETIRACETAM 500 MG PO TABS
1000.0000 mg | ORAL_TABLET | Freq: Two times a day (BID) | ORAL | Status: DC
  Filled 2022-03-09: qty 2

## 2022-03-09 MED ORDER — APIXABAN 2.5 MG PO TABS
2.5000 mg | ORAL_TABLET | Freq: Two times a day (BID) | ORAL | Status: DC
  Administered 2022-03-09 – 2022-03-11 (×5): 2.5 mg via ORAL
  Filled 2022-03-09 (×6): qty 1

## 2022-03-09 MED ORDER — ORAL CARE MOUTH RINSE
15.0000 mL | OROMUCOSAL | Status: DC | PRN
  Administered 2022-03-09: 15 mL via OROMUCOSAL
  Filled 2022-03-09: qty 15

## 2022-03-09 MED ORDER — SIMVASTATIN 20 MG PO TABS
40.0000 mg | ORAL_TABLET | Freq: Every day | ORAL | Status: DC
  Administered 2022-03-09 – 2022-03-11 (×3): 40 mg via ORAL
  Filled 2022-03-09: qty 2
  Filled 2022-03-09: qty 4
  Filled 2022-03-09: qty 2

## 2022-03-09 MED ORDER — HYDRALAZINE HCL 20 MG/ML IJ SOLN
10.0000 mg | Freq: Four times a day (QID) | INTRAMUSCULAR | Status: DC | PRN
  Administered 2022-03-09 – 2022-03-11 (×3): 10 mg via INTRAVENOUS
  Filled 2022-03-09 (×3): qty 1

## 2022-03-09 MED ORDER — LEVETIRACETAM IN NACL 500 MG/100ML IV SOLN
500.0000 mg | Freq: Two times a day (BID) | INTRAVENOUS | Status: DC
  Administered 2022-03-09 – 2022-03-10 (×2): 500 mg via INTRAVENOUS
  Filled 2022-03-09 (×3): qty 100

## 2022-03-09 MED ORDER — ONDANSETRON HCL 4 MG/2ML IJ SOLN: 4.0000 mg | Freq: Four times a day (QID) | INTRAMUSCULAR | Status: DC | PRN

## 2022-03-09 MED ORDER — ACETAMINOPHEN 325 MG PO TABS
650.0000 mg | ORAL_TABLET | ORAL | Status: DC | PRN
  Administered 2022-03-09 – 2022-03-10 (×2): 650 mg via ORAL
  Filled 2022-03-09 (×2): qty 2

## 2022-03-09 MED ORDER — ORAL CARE MOUTH RINSE
15.0000 mL | OROMUCOSAL | Status: DC
  Administered 2022-03-09 – 2022-03-12 (×21): 15 mL via OROMUCOSAL
  Filled 2022-03-09 (×22): qty 15

## 2022-03-09 MED ORDER — GABAPENTIN 100 MG PO CAPS
200.0000 mg | ORAL_CAPSULE | Freq: Three times a day (TID) | ORAL | Status: DC
  Administered 2022-03-09 – 2022-03-12 (×9): 200 mg via ORAL
  Filled 2022-03-09 (×10): qty 2

## 2022-03-09 MED ORDER — ACETAMINOPHEN 650 MG RE SUPP: 650.0000 mg | RECTAL | Status: DC | PRN

## 2022-03-09 MED ORDER — METOPROLOL TARTRATE 50 MG PO TABS
50.0000 mg | ORAL_TABLET | Freq: Two times a day (BID) | ORAL | Status: DC
  Administered 2022-03-10 – 2022-03-12 (×5): 50 mg via ORAL
  Filled 2022-03-09 (×3): qty 1
  Filled 2022-03-09: qty 2
  Filled 2022-03-09 (×2): qty 1
  Filled 2022-03-09: qty 2

## 2022-03-09 MED ORDER — LEVOTHYROXINE SODIUM 25 MCG PO TABS
25.0000 ug | ORAL_TABLET | Freq: Every day | ORAL | Status: DC
  Administered 2022-03-10 – 2022-03-12 (×3): 25 ug via ORAL
  Filled 2022-03-09 (×4): qty 1

## 2022-03-09 MED ORDER — LEVETIRACETAM IN NACL 500 MG/100ML IV SOLN
500.0000 mg | Freq: Two times a day (BID) | INTRAVENOUS | Status: DC
  Filled 2022-03-09: qty 100

## 2022-03-09 MED ORDER — LORAZEPAM 2 MG/ML IJ SOLN: 2.0000 mg | INTRAMUSCULAR | Status: DC | PRN

## 2022-03-09 MED ORDER — LAMOTRIGINE 100 MG PO TABS
100.0000 mg | ORAL_TABLET | Freq: Two times a day (BID) | ORAL | Status: DC
  Administered 2022-03-09 – 2022-03-12 (×7): 100 mg via ORAL
  Filled 2022-03-09 (×7): qty 1

## 2022-03-09 MED ORDER — LEVETIRACETAM IN NACL 500 MG/100ML IV SOLN: 500.0000 mg | INTRAVENOUS | Status: DC

## 2022-03-09 MED ORDER — SODIUM CHLORIDE 0.9 % IV SOLN
75.0000 mL/h | INTRAVENOUS | Status: DC
  Administered 2022-03-09 – 2022-03-10 (×2): 75 mL/h via INTRAVENOUS

## 2022-03-09 MED ORDER — ONDANSETRON HCL 4 MG PO TABS: 4.0000 mg | ORAL_TABLET | Freq: Four times a day (QID) | ORAL | Status: DC | PRN

## 2022-03-09 NOTE — Assessment & Plan Note (Signed)
Creatinine 1.76 up from baseline of 0.48, suspect prerenal secondary to poor oral intake Received a 2 L NS bolus in the ED Continue IV fluids Monitor renal function and avoid nephrotoxins

## 2022-03-09 NOTE — Assessment & Plan Note (Signed)
Delirium precautions 

## 2022-03-09 NOTE — Assessment & Plan Note (Signed)
Seizure disorder as sequela of prior CVA Follow Keppra level May have been triggered by dehydration, poor oral intake given AKI Continue home Lamictal and Keppra Ativan as needed seizure Seizure precaution, aspiration precautions

## 2022-03-09 NOTE — IPAL (Signed)
  Interdisciplinary Goals of Care Family Meeting   Date carried out: 03/09/2022  Location of the meeting: Phone conference  Member's involved: Physician and Family Member or next of kin, [name]  Durable Power of Attorney or acting medical decision maker: [name]  Discussion: We discussed goals of care for Sonya [name]  I have reviewed medical records including EPIC notes, labs and imaging, assessed the patient and then met with son over phone to discuss major active diagnoses, plan of care, natural trajectory, prognosis, GOC, EOL wishes, disposition and options including Full code/DNI/DNR and the concept of comfort care if DNR is elected. Questions and concerns were addressed. They are  in agreement to continue current plan of care . Election for DNR status.   Code status:   Code Status: DNR   Disposition: Continue current acute care  Time spent for the meeting: No Name, MD  03/09/2022, 12:13 AM

## 2022-03-09 NOTE — Assessment & Plan Note (Signed)
Supportive care. 

## 2022-03-09 NOTE — Assessment & Plan Note (Signed)
Continue metoprolol and simvastatin

## 2022-03-09 NOTE — Assessment & Plan Note (Signed)
History of DVTs Continue apixaban

## 2022-03-09 NOTE — ED Notes (Signed)
Pt refusing to get fluids. States it is hurting her hand. Iv patent, blood return present, flushed without issues. Iv stopped

## 2022-03-09 NOTE — ED Notes (Signed)
This RN attempted to medicate pt with her ordered medications. Pt took the eliquis but refused all of the others stating "I don't need those right now" This RN explained they were important and pt continued to refuse.

## 2022-03-09 NOTE — ED Notes (Signed)
Bladder scan 213ml

## 2022-03-09 NOTE — Consult Note (Addendum)
Neurology Consultation Reason for Consult: Breakthrough seizure Requesting Physician: Marry Guan   CC: Witnessed seizure activity at SNF  History is obtained from: Chart review and son  HPI: Sonya [name] is a 89 y.o. female With a past medical history significant for right frontoparietal stroke with residual left hemiparesis on Eliquis (possibly for DVTs versus atrial fibrillation), complicated by post stroke epilepsy, vasovagal syncope, high-grade stenosis of bilateral internal carotid arteries s/p left carotid endarterectomy (05/2018), hypothyroidism, mixed Alzheimer's and vascular dementia  Per primary team H&P she presented after having a seizure lasting 15 minutes, with unclear seizure trigger though labs were notable for an AKI with creatinine of 1.76 up from baseline of 0.48.    Of note overnight patient refused her oral medications except for Eliquis.  This includes  gabapentin 200 x 3 times daily,  lamotrigine 100 twice daily,  Keppra 1000 mg twice daily  In and out catheterization was unsuccessful for obtaining urine  Regarding her stroke history, she presented on 10/12/2017 with left-sided weakness and left facial droop, found to have a distal right M2 branch occlusion with residual left hemiparesis as well as multiple DVTs  She presented with seizure on 06/25/2018 and was started on Keppra.  At her last outpatient neurology visit 09/18/2020 she was cross tapered from [place] to lamotrigine (1000 mg twice daily, plan to increase up to 100 mg twice daily of lamotrigine)  Additional history from son: -No seizures since 06/2018, to son's knowledge not refusing meds,  -4 days ago he notes didn't eat dinner well, but was really thirsty, no other complaints or concerns that he is aware of -New antidepressant 6-7 months ago, not sure which one  Notably her last admission in 05/22/2021 was for failure to thrive, not eating/drinking and refusing all oral medications.  Family  confirmed at the time and no feeding tube but palliative care consult was declined by son due to "it making him feel bad" she was started on Seroquel and continued on Keppra 1000 mg twice daily as well as lamotrigine 100 mg twice daily   ROS: Unable to obtain due to altered mental status.   Past Medical History:  Diagnosis Date   CAD (coronary artery disease)    Carpal tunnel syndrome    GERD (gastroesophageal reflux disease)    History of shingles    Hormone receptor positive breast cancer ([place]) 1960   Hyperlipemia    Hypertension    Hypothyroidism    Stroke ([place])    left sided weakness   Past Surgical History:  Procedure Laterality Date   ABDOMINAL HYSTERECTOMY     APPENDECTOMY     CAROTID ANGIOGRAPHY Bilateral 03/31/2018   Procedure: CAROTID ANGIOGRAPHY;  Surgeon: Algernon Huxley, MD;  Location: [place] [facility];  Service: Cardiovascular;  Laterality: Bilateral;   cornoary angioplasty     ENDARTERECTOMY Left 05/19/2018   Procedure: ENDARTERECTOMY CAROTID;  Surgeon: Algernon Huxley, MD;  Location: [facility];  Service: Vascular;  Laterality: Left;   ENDOSCOPIC RETROGRADE CHOLANGIOPANCREATOGRAPHY (ERCP) WITH PROPOFOL N/A 11/10/2017   Procedure: ENDOSCOPIC RETROGRADE CHOLANGIOPANCREATOGRAPHY (ERCP) WITH PROPOFOL;  Surgeon: Lucilla Lame, MD;  Location: [facility];  Service: Endoscopy;  Laterality: N/A;   ESOPHAGOGASTRODUODENOSCOPY N/A 03/23/2018   Procedure: ESOPHAGOGASTRODUODENOSCOPY (EGD);  Surgeon: Lin Landsman, MD;  Location: [facility];  Service: Gastroenterology;  Laterality: N/A;   ESOPHAGOGASTRODUODENOSCOPY (EGD) WITH PROPOFOL N/A 11/06/2017   Procedure: ESOPHAGOGASTRODUODENOSCOPY (EGD) WITH PROPOFOL;  Surgeon: Lucilla Lame, MD;  Location: [facility];  Service: Endoscopy;  Laterality:  N/A;   PATCH ANGIOPLASTY Left 05/19/2018   Procedure: PATCH ANGIOPLASTY;  Surgeon: Algernon Huxley, MD;  Location: [facility];  Service: Vascular;  Laterality: Left;     Current Outpatient  Medications  Medication Instructions   acetaminophen (TYLENOL) 325 mg, Oral, Every 4 hours PRN   apixaban (ELIQUIS) 2.5 mg, Oral, 2 times daily   citalopram (CELEXA) 30 mg, Oral, Daily   docusate sodium (COLACE) 100 mg, Oral, 2 times daily   feeding supplement, ENSURE ENLIVE, (ENSURE ENLIVE) LIQD 237 mLs, Oral, 2 times daily between meals   ferrous sulfate 325 mg, Oral, 2 times daily with meals   gabapentin (NEURONTIN) 200 mg, Oral, 3 times daily   lamoTRIgine (LAMICTAL) 100 mg, Oral, Every 12 hours   levETIRAcetam (KEPPRA) 1,000 mg, Oral, 2 times daily   levothyroxine (SYNTHROID) 25 mcg, Oral, Daily before breakfast   lisinopril (ZESTRIL) 10 mg, Oral, Daily   Melatonin 5 mg, Oral, Daily at bedtime   metoprolol tartrate (LOPRESSOR) 50 mg, Oral, 2 times daily   Multiple Vitamin (MULTIVITAMIN WITH MINERALS) TABS tablet 1 tablet, Oral, Daily   ondansetron (ZOFRAN-ODT) 4 mg, Oral, Every 8 hours PRN   QUEtiapine (SEROQUEL) 25 mg, Oral, 2 times daily   simvastatin (ZOCOR) 40 mg, Oral, Daily-1800   Vitamin D3 1,000 Units, Oral, 2 times daily     Family History  Problem Relation Age of Onset   Hypertension Son    Ovarian cancer Mother    Stroke Father    Stroke Son    Social History:  reports that she quit smoking about 24 years ago. Her smoking use included cigarettes. She has a 10.00 pack-year smoking history. She has never used smokeless tobacco. She reports that she does not currently use alcohol. She reports that she does not use drugs.   Exam: Current vital signs: BP (!) 139/41   Pulse (!) 50   Temp 97.6 F (36.4 C) (Axillary)   Resp 10   SpO2 97%  Vital signs in last 24 hours: Temp:  [97.4 F (36.3 C)-98.1 F (36.7 C)] 97.6 F (36.4 C) (01/21 0450) Pulse Rate:  [50-68] 50 (01/21 0400) Resp:  [10-22] 10 (01/21 0400) BP: (102-167)/(36-65) 139/41 (01/21 0400) SpO2:  [95 %-100 %] 97 % (01/21 0400)   Physical Exam  Constitutional: Appears cachectic Psych: Affect mildly  irritable but intermittently cooperative with examination Eyes: Difficult to examine as she reports her eyes hurt and does not want to open them HENT: No oropharyngeal obstruction.  MSK: See motor exam below Cardiovascular: Perfusing extremities well Respiratory: Effort normal, non-labored breathing GI: Soft.  No distension. There is no tenderness.  Skin: Warm dry and intact visible skin  Neuro: Mental Status: Patient is very sleepy and reports she does not want to participate in examination.  She is able to name thumb and pinky.  She does seem to have some left-sided neglect. Cranial Nerves: II: Pupils are both reactive to light, unable to assess if they are fully equal given forced eye closure III,IV, VI: Does seem to have a right gaze preference but will move eyes at least midline V: Facial sensation is symmetric to eyelash brush VII: Facial movement is notable for left facial droop.  VIII: hearing is intact to voice XII: tongue is midline without atrophy or fasciculations.  Motor: Tone is increased throughout, likely paratonia on the right and spasticity on the left.  Left-sided weakness compared to the right Further examination was declined by patient  I have reviewed labs  in epic and the results pertinent to this consultation are:  Basic Metabolic Panel: Recent Labs  Lab 03/08/22 2100  NA 141  K 4.2  CL 107  CO2 26  GLUCOSE 140*  BUN 42*  CREATININE 1.76*  CALCIUM 8.5*  MG 2.3    CBC: Recent Labs  Lab 03/08/22 2100  WBC 10.5  HGB 12.5  HCT 40.6  MCV 94.0  PLT 221    Coagulation Studies: No results for input(s): "LABPROT", "INR" in the last 72 hours.   TSH, T4 within normal limits  EKG reviewed, sinus rhythm without evidence of heart block  I have reviewed the images obtained:  Head CT reviewed, agree with radiology, no acute intracranial process, chronic right frontoparietal stroke  Impression: Breakthrough seizure without clear triggering factor,  initially had some concern for possible missed medications but per medication administration record she was receiving medications; awaiting drug levels for confirmation.  Initially consider discontinuing lamotrigine based on low seizure frequency and previously documented plan for cross taper without clear indication for why she was left on both medications.  However now that she has had a breakthrough seizure while reportedly on both medications, will not change medications (though may reconsider this if the levels come back low).  For now her Keppra does need to be renally adjusted  She will need goals of care discussion if she is refusing medications. Son says she is not refusing medications at the facility to his knowledge, but refusing medications and poor oral intake was documented as a reason for her admission in April 2023 when she transitioned to the facility, and she has refused oral medications here overnight.  Initial recommendations: -Keppra 500 mg twice daily IV for now pending calculation of her creatinine clearance and given oral medication refusal  Recommendations: - Pharmacy consult to attempt to obtain MAR from facility  -- addendum, per [facility] she was receiving lamotrigine 100 3 times daily and levetiracetam 1000 twice daily without any missed doses - Chest x-ray and follow-up UA and monitor fever curve to assess for infectious contribution to lowering seizure threshold - Follow-up lamotrigine and levetiracetam levels in process to confirm adherence/absorption (this may not result until after discharge) - Keppra 500 every 12 hours, please increase back to 1 g every 12 hours after her creatinine clearance improves to greater than 50 (may increase to 750 mg every 12 hours once creatinine clearance improves to greater than 30) Keppra dose chart based on Estimated Creatinine Clearance: 17.1 mL/min (A) (by C-G formula based on SCr of 1.76 mg/dL (H)).   CrCl 80 to 130 mL/minute/1.73 m2:  500 mg to 1.5 g every 12 hours.  CrCl 50 to <80 mL/minute/1.73 m2: 500 mg to 1 g every 12 hours.  CrCl 30 to <50 mL/minute/1.73 m2: 250 to 750 mg every 12 hours.  CrCl 15 to <30 mL/minute/1.73 m2: 250 to 500 mg every 12 hours.  CrCl <15 mL/minute/1.73 m2: 250 to 500 mg every 24 hours (expert opinion). - Continue lamotrigine 100 twice daily - If she has further concern for seizure activity would give 200 mg of Vimpat loading dose followed by 100 mg twice daily as this is available IV in addition to p.o. and please notify neurology to reassess - Note gabapentin can also have antiseizure effects and therefore I do recommend continuing this medication if patient will tolerate, noting dose may need to be renally adjusted - Palliative care consult given patient is refusing medications and family has indicated she would not  want a feeding tube in the past - Appreciate primary team management of comorbidities - Neurology will follow along to confirm seizure control  Lesleigh Noe MD-PhD [facility] [phone] [facility] coverage for [facility] is from 8 AM to 4 AM in-house and 4 PM to 8 PM by telephone/video. 8 PM to 8 AM emergent questions or overnight urgent questions should be addressed to Teleneurology On-call or [facility] neurohospitalist; contact information can be found on AMION

## 2022-03-09 NOTE — ED Notes (Signed)
This RN attempted in and out cath with no success

## 2022-03-09 NOTE — Assessment & Plan Note (Signed)
BP slightly elevated in the ED Hold lisinopril due to renal function Continue metoprolol

## 2022-03-09 NOTE — ED Notes (Signed)
Spoke with Sonya [name] at [facility] to give update.

## 2022-03-09 NOTE — H&P (Signed)
History and Physical    Patient: Sonya [name] JOA:[id number] DOB: 1932-06-08 DOA: 03/08/2022 DOS: the patient was seen and examined on 03/09/2022 PCP: Baxter Hire, MD  Patient coming from: SNF  Chief Complaint:  Chief Complaint  Patient presents with   Seizures    HPI: Sonya [name] is a 89 y.o. 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  She was postictal on arrival of EMS but was back to baseline on arrival was confirmed by son, and as reported by ED provider.  Patient unable to contribute to history due to dementia.  Unknown if patient was recently ill with vomiting or inability to take her medication. ED course and data review:Vitals unremarkable.  Labs notable for creatinine of 1.76, up from baseline of 0.48.  Urinalysis pending.  EKG, personally reviewed and interpreted showed sinus rhythm at 60 with no ischemic ST-T wave changes.  CT head nonacute showing the following: IMPRESSION: 1. No acute intracranial process. 2. Old infarcts in the right cerebral hemisphere and left cerebellum. 3. Atrophy and chronic small vessel ischemic changes.   Patient was given a 2 L NS bolus and hospitalist consulted for admission.     Past Medical History:  Diagnosis Date   CAD (coronary artery disease)    Carpal tunnel syndrome    GERD (gastroesophageal reflux disease)    History of shingles    Hormone receptor positive breast cancer ([place]) 1960   Hyperlipemia    Hypertension    Hypothyroidism    Stroke ([place])    left sided weakness   Past Surgical History:  Procedure Laterality Date   ABDOMINAL HYSTERECTOMY     APPENDECTOMY     CAROTID ANGIOGRAPHY Bilateral 03/31/2018    Procedure: CAROTID ANGIOGRAPHY;  Surgeon: Algernon Huxley, MD;  Location: [facility];  Service: Cardiovascular;  Laterality: Bilateral;   cornoary angioplasty     ENDARTERECTOMY Left 05/19/2018   Procedure: ENDARTERECTOMY CAROTID;  Surgeon: Algernon Huxley, MD;  Location: [facility];  Service: Vascular;  Laterality: Left;   ENDOSCOPIC RETROGRADE CHOLANGIOPANCREATOGRAPHY (ERCP) WITH PROPOFOL N/A 11/10/2017   Procedure: ENDOSCOPIC RETROGRADE CHOLANGIOPANCREATOGRAPHY (ERCP) WITH PROPOFOL;  Surgeon: Lucilla Lame, MD;  Location: [facility];  Service: Endoscopy;  Laterality: N/A;   ESOPHAGOGASTRODUODENOSCOPY N/A 03/23/2018   Procedure: ESOPHAGOGASTRODUODENOSCOPY (EGD);  Surgeon: Lin Landsman, MD;  Location: [facility] END[facility];  Service: Gastroenterology;  Laterality: N/A;   ESOPHAGOGASTRODUODENOSCOPY (EGD) WITH PROPOFOL N/A 11/06/2017   Procedure: ESOPHAGOGASTRODUODENOSCOPY (EGD) WITH PROPOFOL;  Surgeon: Lucilla Lame, MD;  Location: [facility];  Service: Endoscopy;  Laterality: N/A;   PATCH ANGIOPLASTY Left 05/19/2018   Procedure: PATCH ANGIOPLASTY;  Surgeon: Algernon Huxley, MD;  Location: [facility];  Service: Vascular;  Laterality: Left;   Social History:  reports that she quit smoking about 24 years ago. Her smoking use included cigarettes. She has a 10.00 pack-year smoking history. She has never used smokeless tobacco. She reports that she does not currently use alcohol. She reports that she does not use drugs.  Allergies  Allergen Reactions   Clopidogrel Nausea And Vomiting     reported by [place] 11/24/13   Omeprazole Other (See Comments)    Unknown reaction - reported by [place] 11/24/13  Family History  Problem Relation Age of Onset   Hypertension Son    Ovarian cancer Mother    Stroke Father    Stroke Son     Prior to Admission medications   Medication Sig Start Date End Date Taking? Authorizing Provider  acetaminophen (TYLENOL) 325 MG  tablet Take 1 tablet (325 mg total) by mouth every 4 (four) hours as needed for mild pain (or temp >/= 101 F). 05/20/18   Stegmayer, Janalyn Harder, PA-C  apixaban (ELIQUIS) 2.5 MG TABS tablet Take 1 tablet (2.5 mg total) by mouth 2 (two) times daily. 03/24/18   Epifanio Lesches, MD  Cholecalciferol (VITAMIN D3) 1000 units CAPS Take 1,000 Units by mouth 2 (two) times a day.  09/07/17   [provider]  citalopram (CELEXA) 20 MG tablet Take 20 mg by mouth daily. 02/06/21   [provider]  docusate sodium (COLACE) 100 MG capsule Take 100 mg by mouth 2 (two) times daily.    [provider]  feeding supplement, ENSURE ENLIVE, (ENSURE ENLIVE) LIQD Take 237 mLs by mouth 2 (two) times daily between meals. Patient not taking: Reported on 08/18/2018 06/27/18   Lucila Maine C, DO  ferrous sulfate 325 (65 FE) MG tablet Take 1 tablet (325 mg total) by mouth 2 (two) times daily with a meal. Patient not taking: Reported on 06/26/2018 04/12/18   Gladstone Lighter, MD  gabapentin (NEURONTIN) 100 MG capsule Take 200 mg by mouth 3 (three) times daily.    [provider]  lamoTRIgine (LAMICTAL) 100 MG tablet Take 50 mg by mouth in the morning and 100 mg nightly for 7 days. Then increase to 100 mg twice daily thereafter. 11/13/20   [provider]  levETIRAcetam (KEPPRA) 1000 MG tablet Take 1 tablet (1,000 mg total) by mouth 2 (two) times daily. Patient not taking: Reported on 02/22/2019 06/27/18   Steve Rattler, DO  levothyroxine (SYNTHROID, LEVOTHROID) 25 MCG tablet Take 25 mcg by mouth daily before breakfast.  09/10/17   [provider]  lisinopril (ZESTRIL) 10 MG tablet Take 1 tablet (10 mg total) by mouth daily. 05/23/21   Lorella Nimrod, MD  Melatonin 5 MG CAPS Take 5 mg by mouth at bedtime.    [provider]  metoprolol tartrate (LOPRESSOR) 50 MG tablet Take 1 tablet (50 mg total) by mouth 2 (two) times daily. 05/22/21   Lorella Nimrod, MD  Multiple Vitamin  (MULTIVITAMIN WITH MINERALS) TABS tablet Take 1 tablet by mouth daily. 05/22/21   Lorella Nimrod, MD  ondansetron (ZOFRAN-ODT) 4 MG disintegrating tablet Take 1 tablet (4 mg total) by mouth every 8 (eight) hours as needed for nausea or vomiting. 02/24/18   Stark Jock Jude, MD  simvastatin (ZOCOR) 40 MG tablet Take 1 tablet (40 mg total) by mouth daily at 6 PM. 02/24/18   Otila Back, MD    Physical Exam: Vitals:   03/08/22 2100 03/08/22 2130 03/08/22 2200 03/08/22 2300  BP: (!) 121/42 (!) 113/36 (!) 124/45 (!) 161/63  Pulse: 60 (!) 56 (!) 58 68  Resp: '11 13 12 12  '$ Temp:      TempSrc:      SpO2: 97% 99% 97% 99%   Physical Exam Vitals and nursing note reviewed.  Constitutional:      General: She is not in acute distress.    Comments: Frail-appearing elderly female  HENT:     Head: Normocephalic and atraumatic.  Cardiovascular:     Rate and Rhythm: Normal rate and regular rhythm.  Heart sounds: Normal heart sounds.  Pulmonary:     Effort: Pulmonary effort is normal.     Breath sounds: Normal breath sounds.  Abdominal:     Palpations: Abdomen is soft.     Tenderness: There is no abdominal tenderness.  Neurological:     Mental Status: She is lethargic.     Comments: Left hemiplegia with contracture     Labs on Admission: I have personally reviewed following labs and imaging studies  CBC: Recent Labs  Lab 03/08/22 2100  WBC 10.5  HGB 12.5  HCT 40.6  MCV 94.0  PLT 195   Basic Metabolic Panel: Recent Labs  Lab 03/08/22 2100  NA 141  K 4.2  CL 107  CO2 26  GLUCOSE 140*  BUN 42*  CREATININE 1.76*  CALCIUM 8.5*  MG 2.3   GFR: CrCl cannot be calculated (Unknown ideal weight.). Liver Function Tests: Recent Labs  Lab 03/08/22 2100  AST 16  ALT 15  ALKPHOS 69  BILITOT 0.4  PROT 5.9*  ALBUMIN 3.3*   No results for input(s): "LIPASE", "AMYLASE" in the last 168 hours. No results for input(s): "AMMONIA" in the last 168 hours. Coagulation Profile: No results for  input(s): "INR", "PROTIME" in the last 168 hours. Cardiac Enzymes: No results for input(s): "CKTOTAL", "CKMB", "CKMBINDEX", "TROPONINI" in the last 168 hours. BNP (last 3 results) No results for input(s): "PROBNP" in the last 8760 hours. HbA1C: No results for input(s): "HGBA1C" in the last 72 hours. CBG: Recent Labs  Lab 03/08/22 1947  GLUCAP 141*   Lipid Profile: No results for input(s): "CHOL", "HDL", "LDLCALC", "TRIG", "CHOLHDL", "LDLDIRECT" in the last 72 hours. Thyroid Function Tests: No results for input(s): "TSH", "T4TOTAL", "FREET4", "T3FREE", "THYROIDAB" in the last 72 hours. Anemia Panel: No results for input(s): "VITAMINB12", "FOLATE", "FERRITIN", "TIBC", "IRON", "RETICCTPCT" in the last 72 hours. Urine analysis:    Component Value Date/Time   COLORURINE YELLOW (A) 05/12/2021 1629   APPEARANCEUR HAZY (A) 05/12/2021 1629   LABSPEC 1.023 05/12/2021 1629   PHURINE 5.0 05/12/2021 1629   GLUCOSEU NEGATIVE 05/12/2021 1629   HGBUR NEGATIVE 05/12/2021 1629   BILIRUBINUR NEGATIVE 05/12/2021 1629   KETONESUR 5 (A) 05/12/2021 1629   PROTEINUR 30 (A) 05/12/2021 1629   NITRITE NEGATIVE 05/12/2021 1629   LEUKOCYTESUR NEGATIVE 05/12/2021 1629    Radiological Exams on Admission: CT Head Wo Contrast  Result Date: 03/08/2022 CLINICAL DATA:  Mental status change EXAM: CT HEAD WITHOUT CONTRAST TECHNIQUE: Contiguous axial images were obtained from the base of the skull through the vertex without intravenous contrast. RADIATION DOSE REDUCTION: This exam was performed according to the departmental dose-optimization program which includes automated exposure control, adjustment of the mA and/or kV according to patient size and/or use of iterative reconstruction technique. COMPARISON:  CT head 05/08/2021.  MRI brain 10/13/2017. FINDINGS: Brain: Encephalomalacia in the right frontal temporal and parietal regions as well as right insula appear unchanged compatible with old infarct. There is no  evidence for acute infarct, hemorrhage or extra-axial fluid collection. Small areas of old infarct in the left cerebellum appear new from prior. Mild diffuse atrophy again noted. Old lacunar infarct and mild periventricular white matter hypodensity appears similar to prior. There is no hydrocephalus or midline shift. Vascular: Atherosclerotic calcifications are present within the cavernous internal carotid arteries. Skull: Normal. Negative for fracture or focal lesion. Sinuses/Orbits: No acute finding. Other: None. IMPRESSION: 1. No acute intracranial process. 2. Old infarcts in the right cerebral hemisphere and left cerebellum. 3.  Atrophy and chronic small vessel ischemic changes. Electronically Signed   By: Ronney Asters M.D.   On: 03/08/2022 20:11     Data Reviewed: Relevant notes from primary care and specialist visits, past discharge summaries as available in EHR, including Care Everywhere. Prior diagnostic testing as pertinent to current admission diagnoses Updated medications and problem lists for reconciliation ED course, including vitals, labs, imaging, treatment and response to treatment Triage notes, nursing and pharmacy notes and ED provider's notes Notable results as noted in HPI   Assessment and Plan: * Breakthrough seizure ([place]) Seizure disorder as sequela of prior CVA Follow Keppra level May have been triggered by dehydration, poor oral intake given AKI Continue home Lamictal and Keppra Ativan as needed seizure Seizure precaution, aspiration precautions  AKI (acute kidney injury) ([place]) Creatinine 1.76 up from baseline of 0.48, suspect prerenal secondary to poor oral intake Received a 2 L NS bolus in the ED Continue IV fluids Monitor renal function and avoid nephrotoxins  Chronic anticoagulation History of DVTs Continue apixaban  Dementia without behavioral disturbance ([place]) Delirium precautions  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 Curly Shores  Advance Care Planning: DNR  Family Communication: Ferne Reus  Disposition Plan: Back to previous home environment  Severity of Illness: The appropriate patient status for this patient is OBSERVATION. Observation status is judged to be reasonable and necessary in order to provide the required intensity of service to ensure the patient's safety. The patient's presenting symptoms, physical exam findings, and initial radiographic and laboratory data in the context of their medical condition is felt to place them at decreased risk for further clinical deterioration. Furthermore, it is anticipated that the patient will be medically stable for discharge from the hospital within 2 midnights of admission.   Author: Athena Masse, MD 03/09/2022 12:00 AM  For on call review [email or web address].

## 2022-03-10 DIAGNOSIS — F0150 Vascular dementia without behavioral disturbance: Secondary | ICD-10-CM | POA: Diagnosis present

## 2022-03-10 DIAGNOSIS — T50916A Underdosing of multiple unspecified drugs, medicaments and biological substances, initial encounter: Secondary | ICD-10-CM | POA: Diagnosis present

## 2022-03-10 DIAGNOSIS — E785 Hyperlipidemia, unspecified: Secondary | ICD-10-CM | POA: Diagnosis present

## 2022-03-10 DIAGNOSIS — R29810 Facial weakness: Secondary | ICD-10-CM | POA: Diagnosis present

## 2022-03-10 DIAGNOSIS — L89322 Pressure ulcer of left buttock, stage 2: Secondary | ICD-10-CM | POA: Diagnosis present

## 2022-03-10 DIAGNOSIS — K219 Gastro-esophageal reflux disease without esophagitis: Secondary | ICD-10-CM | POA: Diagnosis present

## 2022-03-10 DIAGNOSIS — G309 Alzheimer's disease, unspecified: Secondary | ICD-10-CM | POA: Diagnosis present

## 2022-03-10 DIAGNOSIS — E039 Hypothyroidism, unspecified: Secondary | ICD-10-CM | POA: Diagnosis present

## 2022-03-10 DIAGNOSIS — Z86718 Personal history of other venous thrombosis and embolism: Secondary | ICD-10-CM | POA: Diagnosis not present

## 2022-03-10 DIAGNOSIS — N179 Acute kidney failure, unspecified: Secondary | ICD-10-CM | POA: Diagnosis present

## 2022-03-10 DIAGNOSIS — I69398 Other sequelae of cerebral infarction: Secondary | ICD-10-CM | POA: Diagnosis not present

## 2022-03-10 DIAGNOSIS — F0280 Dementia in other diseases classified elsewhere without behavioral disturbance: Secondary | ICD-10-CM | POA: Diagnosis present

## 2022-03-10 DIAGNOSIS — Z79899 Other long term (current) drug therapy: Secondary | ICD-10-CM | POA: Diagnosis not present

## 2022-03-10 DIAGNOSIS — Z79890 Hormone replacement therapy (postmenopausal): Secondary | ICD-10-CM | POA: Diagnosis not present

## 2022-03-10 DIAGNOSIS — G40919 Epilepsy, unspecified, intractable, without status epilepticus: Secondary | ICD-10-CM | POA: Diagnosis not present

## 2022-03-10 DIAGNOSIS — I69354 Hemiplegia and hemiparesis following cerebral infarction affecting left non-dominant side: Secondary | ICD-10-CM | POA: Diagnosis not present

## 2022-03-10 DIAGNOSIS — I2510 Atherosclerotic heart disease of native coronary artery without angina pectoris: Secondary | ICD-10-CM | POA: Diagnosis present

## 2022-03-10 DIAGNOSIS — Z955 Presence of coronary angioplasty implant and graft: Secondary | ICD-10-CM | POA: Diagnosis not present

## 2022-03-10 DIAGNOSIS — I10 Essential (primary) hypertension: Secondary | ICD-10-CM | POA: Diagnosis present

## 2022-03-10 DIAGNOSIS — R569 Unspecified convulsions: Secondary | ICD-10-CM | POA: Diagnosis present

## 2022-03-10 DIAGNOSIS — E860 Dehydration: Secondary | ICD-10-CM | POA: Diagnosis present

## 2022-03-10 DIAGNOSIS — Z91128 Patient's intentional underdosing of medication regimen for other reason: Secondary | ICD-10-CM | POA: Diagnosis not present

## 2022-03-10 DIAGNOSIS — Z515 Encounter for palliative care: Secondary | ICD-10-CM | POA: Diagnosis not present

## 2022-03-10 DIAGNOSIS — Z7901 Long term (current) use of anticoagulants: Secondary | ICD-10-CM | POA: Diagnosis not present

## 2022-03-10 DIAGNOSIS — Z66 Do not resuscitate: Secondary | ICD-10-CM | POA: Diagnosis present

## 2022-03-10 LAB — BASIC METABOLIC PANEL
Anion gap: 8 (ref 5–15)
BUN: 19 mg/dL (ref 8–23)
CO2: 22 mmol/L (ref 22–32)
Calcium: 8.3 mg/dL — ABNORMAL LOW (ref 8.9–10.3)
Chloride: 110 mmol/L (ref 98–111)
Creatinine, Ser: 0.88 mg/dL (ref 0.44–1.00)
GFR, Estimated: >60 mL/min (ref >60)
Glucose, Bld: 87 mg/dL (ref 70–99)
Potassium: 4.1 mmol/L (ref 3.5–5.1)
Sodium: 140 mmol/L (ref 135–145)

## 2022-03-10 LAB — URINALYSIS, ROUTINE W REFLEX MICROSCOPIC
Bilirubin Urine: NEGATIVE
Glucose, UA: NEGATIVE mg/dL
Hgb urine dipstick: NEGATIVE
Ketones, ur: NEGATIVE mg/dL
Leukocytes,Ua: NEGATIVE
Nitrite: NEGATIVE
Protein, ur: 100 mg/dL — AB
Specific Gravity, Urine: >1.046 — ABNORMAL HIGH (ref 1.005–1.030)
pH: 5 (ref 5.0–8.0)

## 2022-03-10 LAB — CBC
HCT: 41.1 % (ref 36.0–46.0)
Hemoglobin: 12.7 g/dL (ref 12.0–15.0)
MCH: 29.1 pg (ref 26.0–34.0)
MCHC: 30.9 g/dL (ref 30.0–36.0)
MCV: 94.3 fL (ref 80.0–100.0)
Platelets: 223 10*3/uL (ref 150–400)
RBC: 4.36 MIL/uL (ref 3.87–5.11)
RDW: 12.8 % (ref 11.5–15.5)
WBC: 8.9 10*3/uL (ref 4.0–10.5)
nRBC: 0 % (ref 0.0–0.2)

## 2022-03-10 MED ORDER — LISINOPRIL 10 MG PO TABS
10.0000 mg | ORAL_TABLET | Freq: Every day | ORAL | Status: DC
  Administered 2022-03-10 – 2022-03-12 (×3): 10 mg via ORAL
  Filled 2022-03-10 (×3): qty 1

## 2022-03-10 MED ORDER — SODIUM CHLORIDE 0.9 % IV SOLN
750.0000 mg | Freq: Two times a day (BID) | INTRAVENOUS | Status: DC
  Administered 2022-03-10 – 2022-03-12 (×5): 750 mg via INTRAVENOUS
  Filled 2022-03-10 (×6): qty 7.5

## 2022-03-10 MED ORDER — HALOPERIDOL LACTATE 5 MG/ML IJ SOLN
2.0000 mg | INTRAMUSCULAR | Status: DC | PRN
  Administered 2022-03-10 (×2): 2 mg via INTRAVENOUS
  Filled 2022-03-10 (×2): qty 1

## 2022-03-10 NOTE — ED Notes (Signed)
Pt denies nausea; pt reports mild abdominal discomfort.

## 2022-03-10 NOTE — Progress Notes (Signed)
PROGRESS NOTE  Sonya [name] RSW:[id number] DOB: [birth year] DOA: 03/08/2022 PCP: Baxter Hire, MD  Hospital Course/Subjective: Sonya [name] is a 89 y.o. 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  She was admitted to the hospitalist service, started on her home Lamictal, and IV Keppra renally dosed due to AKI.  Discussed with neurology multiple times yesterday, as well as pharmacy staff.  MAR from facility indicates that patient has been taking both Lamictal and Keppra at the facility, however after admission here she was refusing most of her medications so there is some question about compliance.  Patient seen and examined this morning in the ER, she is resting comfortably and has been taking her medications since last night.  She tells me that she wants to be treated, and is willing to take her medications as prescribed.  Also discussed with pharmacy staff this morning, who has adjusted Keppra dosing for her improving renal function.  Assessment and Plan: * Breakthrough seizure ([place]) Seizure disorder as sequela of prior CVA Follow Keppra level May have been triggered by dehydration, poor oral intake given AKI Continue home Lamictal and Keppra, with dose adjustment for renal function Ativan as needed seizure Seizure precaution, aspiration precautions  AKI (acute kidney injury) ([place]) Creatinine 1.76 up from baseline of 0.48, suspect prerenal secondary to poor oral intake Received a 2 L NS bolus in the ED Discontinue IV fluids today as she is eating, and renal function is back to baseline  Chronic anticoagulation History of DVTs Continue apixaban  Dementia without behavioral  disturbance ([place]) Delirium precautions  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  Consultants: Neurology  Antimicrobials: Anti-infectives (From admission, onward)    None       Objective: Vitals:   03/10/22 0255 03/10/22 0400 03/10/22 0500 03/10/22 0544  BP:  (!) 126/39 (!) 142/46   Pulse:  (!) 54 (!) 59   Resp: '15 12 12   '$ Temp:    97.9 F (36.6 C)  TempSrc:    Oral  SpO2: 97% 97% 98%   Weight:      Height:        Intake/Output Summary (Last 24 hours) at 03/10/2022 0856 Last data filed at 03/10/2022 0001 Gross per 24 hour  Intake 93.61 ml  Output 600 ml  Net -506.39 ml   Filed Weights   03/09/22 0921  Weight: 48 kg   Exam: General:  Alert, oriented to self and placed, resting comfortably Eyes: EOMI, clear sclerea Neck: supple, no masses, trachea mildline  Cardiovascular: RRR, no murmurs or rubs, no peripheral edema  Respiratory: clear to auscultation bilaterally, no wheezes, no crackles  Abdomen: soft, nontender, nondistended, normal bowel tones heard  Skin: dry, no rashes  Musculoskeletal: no joint effusions, normal range of motion  Psychiatric: appropriate affect, normal speech  Neurologic: extraocular muscles intact, clear speech, moving all extremities with intact sensorium   Data Reviewed: CBC: Recent Labs  Lab 03/08/22 2100 03/10/22 0536  WBC 10.5 8.9  HGB 12.5 12.7  HCT 40.6 41.1  MCV 94.0 94.3  PLT 221 884   Basic  Metabolic Panel: Recent Labs  Lab 03/08/22 2100 03/10/22 0536  NA 141 140  K 4.2 4.1  CL 107 110  CO2 26 22  GLUCOSE 140* 87  BUN 42* 19  CREATININE 1.76* 0.88  CALCIUM 8.5* 8.3*  MG 2.3  --    GFR: Estimated Creatinine Clearance: 34.1 mL/min (by C-G formula based on SCr of 0.88 mg/dL). Liver Function Tests: Recent Labs  Lab 03/08/22 2100  AST 16  ALT 15  ALKPHOS 69  BILITOT 0.4  PROT 5.9*  ALBUMIN 3.3*   No results for input(s): "LIPASE", "AMYLASE" in the last 168 hours. No results for input(s): "AMMONIA" in the last 168 hours. Coagulation Profile: No results for input(s): "INR", "PROTIME" in the last 168 hours. Cardiac Enzymes: No results for input(s): "CKTOTAL", "CKMB", "CKMBINDEX", "TROPONINI" in the last 168 hours. BNP (last 3 results) No results for input(s): "PROBNP" in the last 8760 hours. HbA1C: No results for input(s): "HGBA1C" in the last 72 hours. CBG: Recent Labs  Lab 03/08/22 1947  GLUCAP 141*   Lipid Profile: No results for input(s): "CHOL", "HDL", "LDLCALC", "TRIG", "CHOLHDL", "LDLDIRECT" in the last 72 hours. Thyroid Function Tests: Recent Labs    03/08/22 2059  TSH 1.993  FREET4 0.93   Anemia Panel: No results for input(s): "VITAMINB12", "FOLATE", "FERRITIN", "TIBC", "IRON", "RETICCTPCT" in the last 72 hours. Urine analysis:    Component Value Date/Time   COLORURINE YELLOW (A) 05/12/2021 1629   APPEARANCEUR HAZY (A) 05/12/2021 1629   LABSPEC 1.023 05/12/2021 1629   PHURINE 5.0 05/12/2021 1629   GLUCOSEU NEGATIVE 05/12/2021 1629   HGBUR NEGATIVE 05/12/2021 1629   BILIRUBINUR NEGATIVE 05/12/2021 1629   KETONESUR 5 (A) 05/12/2021 1629   PROTEINUR 30 (A) 05/12/2021 1629   NITRITE NEGATIVE 05/12/2021 1629   LEUKOCYTESUR NEGATIVE 05/12/2021 1629   Sepsis Labs: '@LABRCNTIP'$ (procalcitonin:4,lacticidven:4)  )No results found for this or any previous visit (from the past 240 hour(s)).   Studies: DG Chest Port 1 View  Result Date:  03/09/2022 CLINICAL DATA:  Altered mental status EXAM: PORTABLE CHEST 1 VIEW COMPARISON:  05/19/2021 FINDINGS: The heart size and mediastinal contours are within normal limits. Both lungs are clear. Fracture deformity of the proximal left humerus, which has not been previously imaged but is subacute to chronic appearing. IMPRESSION: 1. No acute abnormality of the lungs. 2. Fracture deformity of the proximal left humerus, which has not been previously imaged but is subacute to chronic appearing. Electronically Signed   By: Delanna Ahmadi M.D.   On: 03/09/2022 12:14    Scheduled Meds:  apixaban  2.5 mg Oral BID   gabapentin  200 mg Oral TID   lamoTRIgine  100 mg Oral BID   levothyroxine  25 mcg Oral Q0600   metoprolol tartrate  50 mg Oral BID   mouth rinse  15 mL Mouth Rinse Q2H   simvastatin  40 mg Oral q1800    Continuous Infusions:  levETIRAcetam       LOS: 0 days   Time spent: 26 minutes  Sonya [name] Marry Guan, MD [facility] Pager [phone]  If 7PM-7AM, please contact night-coverage [email or web address] Password [facility] 03/10/2022, 8:56 AM

## 2022-03-10 NOTE — ED Notes (Signed)
Repositioned pt switching pillow from under L hip to R hip; pillow remains between knees and heels elevated; HOB currently 15 degrees.

## 2022-03-10 NOTE — ED Notes (Signed)
Will send urine sample to lab once fresh sample available.

## 2022-03-11 DIAGNOSIS — G40919 Epilepsy, unspecified, intractable, without status epilepticus: Secondary | ICD-10-CM | POA: Diagnosis not present

## 2022-03-11 LAB — CBC
HCT: 38.6 % (ref 36.0–46.0)
Hemoglobin: 12.3 g/dL (ref 12.0–15.0)
MCH: 28.8 pg (ref 26.0–34.0)
MCHC: 31.9 g/dL (ref 30.0–36.0)
MCV: 90.4 fL (ref 80.0–100.0)
Platelets: 229 10*3/uL (ref 150–400)
RBC: 4.27 MIL/uL (ref 3.87–5.11)
RDW: 12.6 % (ref 11.5–15.5)
WBC: 10.4 10*3/uL (ref 4.0–10.5)
nRBC: 0 % (ref 0.0–0.2)

## 2022-03-11 LAB — BASIC METABOLIC PANEL
Anion gap: 7 (ref 5–15)
BUN: 16 mg/dL (ref 8–23)
CO2: 23 mmol/L (ref 22–32)
Calcium: 8.8 mg/dL — ABNORMAL LOW (ref 8.9–10.3)
Chloride: 110 mmol/L (ref 98–111)
Creatinine, Ser: 0.86 mg/dL (ref 0.44–1.00)
GFR, Estimated: >60 mL/min (ref >60)
Glucose, Bld: 98 mg/dL (ref 70–99)
Potassium: 4 mmol/L (ref 3.5–5.1)
Sodium: 140 mmol/L (ref 135–145)

## 2022-03-11 LAB — HEMOGLOBIN: Hemoglobin: 12.8 g/dL (ref 12.0–15.0)

## 2022-03-11 MED ORDER — ENSURE ENLIVE PO LIQD
237.0000 mL | Freq: Two times a day (BID) | ORAL | Status: DC
  Administered 2022-03-11 – 2022-03-12 (×2): 237 mL via ORAL

## 2022-03-11 MED ORDER — ADULT MULTIVITAMIN W/MINERALS CH
1.0000 | ORAL_TABLET | Freq: Every day | ORAL | Status: DC
  Administered 2022-03-11 – 2022-03-12 (×2): 1 via ORAL
  Filled 2022-03-11 (×2): qty 1

## 2022-03-11 NOTE — Progress Notes (Signed)
PROGRESS NOTE  Sonya [name] HMC:[id number] DOB: 06-30-1932 DOA: 03/08/2022 PCP: Baxter Hire, MD  Hospital Course/Subjective: Sonya [name] is a 89 y.o. 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  She was admitted to the hospitalist service, started on her home Lamictal, and IV Keppra renally dosed due to AKI.  Discussed with neurology multiple times yesterday, as well as pharmacy staff.  MAR from facility indicates that patient has been taking both Lamictal and Keppra at the facility, however after admission here she was refusing most of her medications so there is some question about compliance.    Patient seen and examined in her room this morning, she is slightly confused, required a dose of Haldol yesterday due to some agitation.  Overall she has been stable, and taking her medications by mouth.  There is some question about medication compliance as well as goals of care.  Palliative care has been consulted to help clarify.  Assessment and Plan: * Breakthrough seizure ([place]) Seizure disorder as sequela of prior CVA Follow Keppra level May have been triggered by dehydration, poor oral intake given AKI; medication noncompliance (she refused several doses of all of her medications in the ER right after admission) is also suspected Continue home Lamictal and Keppra, with dose adjustment for renal function Ativan as needed seizure Seizure precaution, aspiration precautions  AKI (acute kidney injury) ([place]) Creatinine 1.76 up from baseline of 0.48, suspect prerenal secondary to poor oral intake Received a 2 L NS bolus in the ED Discontinued IV fluids as she is eating, and renal  function is back to baseline  Chronic anticoagulation History of DVTs Continue apixaban  Dementia without behavioral disturbance ([place]) Delirium precautions  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  I am concerned that if she is discharged today without clarification, she may refuse treatment at the nursing home and return again.  Consultants: Neurology  Antimicrobials: Anti-infectives (From admission, onward)    None       Objective: Vitals:   03/10/22 1939 03/11/22 0311 03/11/22 0758 03/11/22 0759  BP: (!) 193/73 (!) 177/72 (!) 188/45   Pulse: 66 68 64 65  Resp: '20 18 18   '$ Temp: 98.4 F (36.9 C) 98 F (36.7 C) 98 F (36.7 C)   TempSrc: Oral Oral    SpO2: 99% 98%  99%  Weight:      Height:       No intake or output data in the 24 hours ending 03/11/22 0935  Filed Weights   03/09/22 0921  Weight: 48 kg   Exam: General:  Alert, oriented to self and place, calm, in no acute distress  Eyes: EOMI, clear conjuctivae, white sclerea Neck: supple, no masses, trachea mildline  Cardiovascular: RRR, no murmurs or rubs, no peripheral  edema  Respiratory: clear to auscultation bilaterally, no wheezes, no crackles  Abdomen: soft, nontender, nondistended, normal bowel tones heard  Skin: dry, no rashes  Musculoskeletal: no joint effusions, normal range of motion  Psychiatric: appropriate affect, normal speech  Neurologic: extraocular muscles  intact, clear speech, moving all extremities with intact sensorium  Data Reviewed: CBC: Recent Labs  Lab 03/08/22 2100 03/10/22 0536 03/11/22 0502  WBC 10.5 8.9 10.4  HGB 12.5 12.7 12.3  HCT 40.6 41.1 38.6  MCV 94.0 94.3 90.4  PLT 221 223 458    Basic Metabolic Panel: Recent Labs  Lab 03/08/22 2100 03/10/22 0536 03/11/22 0502  NA 141 140 140  K 4.2 4.1 4.0  CL 107 110 110  CO2 '26 22 23  '$ GLUCOSE 140* 87 98  BUN 42* 19 16  CREATININE 1.76* 0.88 0.86  CALCIUM 8.5* 8.3* 8.8*  MG 2.3  --   --     GFR: Estimated Creatinine Clearance: 34.9 mL/min (by C-G formula based on SCr of 0.86 mg/dL). Liver Function Tests: Recent Labs  Lab 03/08/22 2100  AST 16  ALT 15  ALKPHOS 69  BILITOT 0.4  PROT 5.9*  ALBUMIN 3.3*    No results for input(s): "LIPASE", "AMYLASE" in the last 168 hours. No results for input(s): "AMMONIA" in the last 168 hours. Coagulation Profile: No results for input(s): "INR", "PROTIME" in the last 168 hours. Cardiac Enzymes: No results for input(s): "CKTOTAL", "CKMB", "CKMBINDEX", "TROPONINI" in the last 168 hours. BNP (last 3 results) No results for input(s): "PROBNP" in the last 8760 hours. HbA1C: No results for input(s): "HGBA1C" in the last 72 hours. CBG: Recent Labs  Lab 03/08/22 1947  GLUCAP 141*    Lipid Profile: No results for input(s): "CHOL", "HDL", "LDLCALC", "TRIG", "CHOLHDL", "LDLDIRECT" in the last 72 hours. Thyroid Function Tests: Recent Labs    03/08/22 2059  TSH 1.993  FREET4 0.93    Anemia Panel: No results for input(s): "VITAMINB12", "FOLATE", "FERRITIN", "TIBC", "IRON", "RETICCTPCT" in the last 72 hours. Urine analysis:    Component Value Date/Time   COLORURINE YELLOW (A) 03/08/2022 0542   APPEARANCEUR CLOUDY (A) 03/08/2022 0542   LABSPEC >1.046 (H) 03/08/2022 0542   PHURINE 5.0 03/08/2022 0542   GLUCOSEU NEGATIVE 03/08/2022 0542   HGBUR NEGATIVE 03/08/2022 0542   BILIRUBINUR NEGATIVE 03/08/2022 0542    KETONESUR NEGATIVE 03/08/2022 0542   PROTEINUR 100 (A) 03/08/2022 0542   NITRITE NEGATIVE 03/08/2022 0542   LEUKOCYTESUR NEGATIVE 03/08/2022 0542   Sepsis Labs: '@LABRCNTIP'$ (procalcitonin:4,lacticidven:4)  )No results found for this or any previous visit (from the past 240 hour(s)).   Studies: No results found.  Scheduled Meds:  apixaban  2.5 mg Oral BID   gabapentin  200 mg Oral TID   lamoTRIgine  100 mg Oral BID   levothyroxine  25 mcg Oral Q0600   lisinopril  10 mg Oral Daily   metoprolol tartrate  50 mg Oral BID   mouth rinse  15 mL Mouth Rinse Q2H   simvastatin  40 mg Oral q1800    Continuous Infusions:  levETIRAcetam 750 mg (03/11/22 0919)     LOS: 1 day   Time spent: 26 minutes  Everardo Voris Marry Guan, MD [facility] Pager [phone]  If 7PM-7AM, please contact night-coverage [email or web address] Password [facility] 03/11/2022, 9:35 AM

## 2022-03-11 NOTE — Progress Notes (Signed)
Initial Nutrition Assessment  DOCUMENTATION CODES:   Not applicable  INTERVENTION:   -Ensure Enlive po BID, each supplement provides 350 kcal and 20 grams of protein -MVI with minerals daily -Liberalize diet of regular for widest variety of meal selections  NUTRITION DIAGNOSIS:   Increased nutrient needs related to chronic illness (CVA) as evidenced by estimated needs.  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  Pt admitted with breakthrough seizure.  Reviewed I/O's: +494 ml since admission   Spoke with pt at bedside, who was pleasant and in good spirits today. She shares that she has been residing at [facility]. She has a good appetite and consumes most of her meals there, but shares that she often does not like the food. Pt currently on a heart healthy diet; no meal completions documented at time of visit. Meal tray at bedside untouched. RD offered to help pt set for breakfast, however, politely declined. Pt denies any need for feeding assistance and denies difficulty chewing or swallowing foods.   Pt unsure of UBW, but does not think she has lost weight. Reviewed wt hx; pt has experienced a 5.5% wt loss over the past year, which is not significant for time frame.   Discussed importance of good meal and supplement intake to promote healing. Pt amenable to Ensure.   Palliative care consulted for goals of care. Per MD notes, concerned about quick readmission without goals of care due  to pt noncompliance with medications.   Medications reviewed and include keppra.   Labs reviewed: CBGS: 141.   NUTRITION - FOCUSED PHYSICAL EXAM:  Flowsheet Row Most Recent Value  Orbital Region No depletion  Upper Arm Region Moderate depletion  Thoracic and Lumbar Region No depletion  Buccal Region No depletion  Temple Region Mild depletion  Clavicle Bone Region No depletion  Clavicle and Acromion Bone Region No depletion  Scapular Bone Region No depletion  Dorsal Hand Mild depletion  Patellar Region Moderate depletion  Anterior Thigh Region Moderate depletion  Posterior Calf Region Moderate depletion  Edema (RD Assessment) None  Hair Reviewed  Eyes Reviewed  Mouth Reviewed  Skin Reviewed  Nails Reviewed       Diet Order:   Diet Order             Diet regular Room service appropriate? Yes; Fluid consistency: Thin  Diet effective now                   EDUCATION NEEDS:   Education needs have been addressed  Skin:  Skin Assessment: Reviewed RN Assessment  Last BM:  03/11/22 (type 4)  Height:   Ht Readings from Last 1 Encounters:  03/09/22 '5\' 2"'$  (1.575 m)    Weight:   Wt Readings from Last 1 Encounters:  03/09/22 48 kg    Ideal Body Weight:  50 kg  BMI:  Body mass index is 19.35 kg/m.  Estimated Nutritional Needs:   Kcal:  1450-1650  Protein:  65-80 grams  Fluid:  > 1.4  Ricka Burdock, RD, LDN, [place] Registered Dietitian II Certified Diabetes Care and Education Specialist Please refer to [facility] for RD and/or RD on-call/weekend/after hours pager

## 2022-03-12 DIAGNOSIS — G40919 Epilepsy, unspecified, intractable, without status epilepticus: Secondary | ICD-10-CM | POA: Diagnosis not present

## 2022-03-12 LAB — BASIC METABOLIC PANEL
Anion gap: 11 (ref 5–15)
BUN: 14 mg/dL (ref 8–23)
CO2: 19 mmol/L — ABNORMAL LOW (ref 22–32)
Calcium: 8.7 mg/dL — ABNORMAL LOW (ref 8.9–10.3)
Chloride: 107 mmol/L (ref 98–111)
Creatinine, Ser: 0.88 mg/dL (ref 0.44–1.00)
GFR, Estimated: >60 mL/min (ref >60)
Glucose, Bld: 85 mg/dL (ref 70–99)
Potassium: 3.9 mmol/L (ref 3.5–5.1)
Sodium: 137 mmol/L (ref 135–145)

## 2022-03-12 LAB — CBC
HCT: 40.4 % (ref 36.0–46.0)
Hemoglobin: 13.1 g/dL (ref 12.0–15.0)
MCH: 29.2 pg (ref 26.0–34.0)
MCHC: 32.4 g/dL (ref 30.0–36.0)
MCV: 90 fL (ref 80.0–100.0)
Platelets: 237 10*3/uL (ref 150–400)
RBC: 4.49 MIL/uL (ref 3.87–5.11)
RDW: 12.6 % (ref 11.5–15.5)
WBC: 11.2 10*3/uL — ABNORMAL HIGH (ref 4.0–10.5)
nRBC: 0 % (ref 0.0–0.2)

## 2022-03-12 LAB — GLUCOSE, CAPILLARY: Glucose-Capillary: 135 mg/dL — ABNORMAL HIGH (ref 70–99)

## 2022-03-12 LAB — PHOSPHORUS: Phosphorus: 2.7 mg/dL (ref 2.5–4.6)

## 2022-03-12 LAB — MAGNESIUM: Magnesium: 1.8 mg/dL (ref 1.7–2.4)

## 2022-03-12 MED ORDER — HYDRALAZINE HCL 20 MG/ML IJ SOLN: 10.0000 mg | Freq: Four times a day (QID) | INTRAMUSCULAR | Status: DC | PRN

## 2022-03-12 MED ORDER — LEVETIRACETAM 750 MG PO TABS: 750.0000 mg | ORAL_TABLET | Freq: Two times a day (BID) | ORAL | 2 refills | Status: AC

## 2022-03-12 MED ORDER — LEVETIRACETAM 750 MG PO TABS
750.0000 mg | ORAL_TABLET | Freq: Two times a day (BID) | ORAL | Status: DC
  Filled 2022-03-12: qty 1

## 2022-03-12 NOTE — Plan of Care (Signed)
Patient ID: Sonya [name], female   DOB: 02/08/1933, 89 y.o.   MRN: [id number]  Problem: Education: Goal: Knowledge of General Education information will improve Description: Including pain rating scale, medication(s)/side effects and non-pharmacologic comfort measures Outcome: Adequate for Discharge   Problem: Health Behavior/Discharge Planning: Goal: Ability to manage health-related needs will improve Outcome: Adequate for Discharge   Problem: Clinical Measurements: Goal: Ability to maintain clinical measurements within normal limits will improve Outcome: Adequate for Discharge Goal: Will remain free from infection Outcome: Adequate for Discharge Goal: Diagnostic test results will improve Outcome: Adequate for Discharge Goal: Respiratory complications will improve Outcome: Adequate for Discharge Goal: Cardiovascular complication will be avoided Outcome: Adequate for Discharge   Problem: Activity: Goal: Risk for activity intolerance will decrease Outcome: Adequate for Discharge   Problem: Nutrition: Goal: Adequate nutrition will be maintained Outcome: Adequate for Discharge   Problem: Coping: Goal: Level of anxiety will decrease Outcome: Adequate for Discharge   Problem: Elimination: Goal: Will not experience complications related to bowel motility Outcome: Adequate for Discharge Goal: Will not experience complications related to urinary retention Outcome: Adequate for Discharge   Problem: Pain Managment: Goal: General experience of comfort will improve Outcome: Adequate for Discharge   Problem: Safety: Goal: Ability to remain free from injury will improve Outcome: Adequate for Discharge   Problem: Skin Integrity: Goal: Risk for impaired skin integrity will decrease Outcome: Adequate for Discharge   Problem: Increased Nutrient Needs (NI-5.1) Goal: Food and/or nutrient delivery Description: Individualized approach for food/nutrient provision. Outcome: Adequate  for Discharge    Haydee Salter, RN

## 2022-03-12 NOTE — TOC Transition Note (Signed)
Transition of Care [facility]) - CM/SW Discharge Note   Patient Details  Name: Sonya [name] MRN: [id number] Date of Birth: 15-Jan-1933  [facility] of [facility] CM/SW Contact:  Gerilyn Pilgrim, LCSW Phone Number: 03/12/2022, 2:38 PM   Clinical Narrative:   Discharge summary in. Dc summary and new fl2 sent to [facility]. Magda Paganini with [facility] notified. [facility] called pt is first in line. CSW signing off.     Final next level of care: Skilled Nursing Facility Barriers to Discharge: Barriers Resolved   Patient Goals and CMS Choice CMS Medicare.gov Compare Post Acute Care list provided to:: Patient    Discharge Placement                    Name of family member notified: pts son. Patient and family notified of of transfer: 03/12/22  Discharge Plan and Services Additional resources added to the After Visit Summary for                                       Social Determinants of Health (SDOH) Interventions SDOH Screenings   Tobacco Use: Medium Risk (05/08/2021)     Readmission Risk Interventions     No data to display

## 2022-03-12 NOTE — Discharge Summary (Signed)
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  It can be increased to 1000 mg p.o. twice daily when creatinine clearance is greater than 50 ml/min Follow with a neurologist in 1 to 2 weeks. Follow up LABS/TEST:  BMP after one wk   Diet recommendation: Pureed  Activity: The patient is advised to gradually reintroduce usual activities, as tolerated  Discharge Condition: stable  Code Status: DNR   History of present illness: As per the H and P dictated on admission Hospital Course:  Sonya [name] is a 89 y.o. 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  She was admitted to the hospitalist service, started on her home Lamictal, and IV Keppra renally dosed due to AKI.  Discussed with neurology multiple times yesterday, as well as pharmacy staff.  MAR from facility indicates  that patient has been taking both Lamictal and Keppra at the facility, however after admission here she was refusing most of her medications so there is some question about compliance.     Patient seen and examined in her room this morning, she is slightly confused, required a dose of Haldol yesterday due to some agitation.  Overall she has been stable, and taking her medications by mouth.  There is some question about medication compliance as well as goals of care.  Palliative care has been consulted to help clarify.   Assessment and Plan: # Breakthrough seizure, Seizure disorder as sequela of prior CVA May have been triggered by dehydration, poor oral intake given AKI; medication noncompliance (she refused several doses of all of her medications in the ER right after admission) is also suspected Continue home Lamictal and Keppra, with dose adjustment for renal function. S/p Ativan as needed seizure. Seizure precaution, aspiration precautions.  Continue Lamictal 100 mg p.o. twice daily and Keppra 750 mg p.o. twice daily, increase Keppra to 1000 mg p.o. twice daily when creatinine clearance is greater than 50 ml/min.  Follow-up with neurology in 1 to 2 weeks # AKI (acute kidney injury) Creatinine 1.76 up from baseline of 0.48, suspect prerenal secondary to poor oral intake. Received a 2 L NS bolus in the ED. Discontinued IV fluids as she is eating, and renal function is back to baseline.  Repeat BMP after 1 week.  Continue oral hydration. # Chronic anticoagulation, History of DVTs, continue apixaban # Dementia without behavioral disturbance, Delirium precautions # Hemiplegia of left nondominant side as late  effect of cerebral infarction, Supportive care # CAD involving native coronary artery of native heart without angina pectoris, Continue metoprolol and simvastatin # Hypertension, BP slightly elevated in the ED, Lisinopril initially held due to AKI, home lisinopril and metoprolol have been resumed  since renal function back to baseline and stable.  Monitor BP and titrate medications accordingly.  May start amlodipine 5 mg p.o. daily if SBP greater than 150 mmHg.  Body mass index is 19.35 kg/m.  Nutrition Problem: Increased nutrient needs Etiology: chronic illness (CVA) Nutrition Interventions: Interventions: Ensure Enlive (each supplement provides 350kcal and 20 grams of protein), MVI, Liberalize Diet  Pressure Injury 05/08/21 Elbow Left;Posterior Stage 4 - Full thickness tissue loss with exposed bone, tendon or muscle. (Active)  05/08/21 2300  Location: Elbow  Location Orientation: Left;Posterior  Staging: Stage 4 - Full thickness tissue loss with exposed bone, tendon or muscle.  Wound Description (Comments):   Present on Admission: Yes     Pressure Injury 05/08/21 Buttocks Left Stage 2 -  Partial thickness loss of dermis presenting as a shallow open injury with a red, pink wound bed without slough. (Active)  05/08/21 2300  Location: Buttocks  Location Orientation: Left  Staging: Stage 2 -  Partial thickness loss of dermis presenting as a shallow open injury with a red, pink wound bed without slough.  Wound Description (Comments):   Present on Admission: Yes     Pressure Injury 05/12/21 Back Left;Upper Stage 1 -  Intact skin with non-blanchable redness of a localized area usually over a bony prominence. Pressure injury from EKG clip/cable. (Active)  05/12/21 1501  Location: Back  Location Orientation: Left;Upper  Staging: Stage 1 -  Intact skin with non-blanchable redness of a localized area usually over a bony prominence.  Wound Description (Comments): Pressure injury from EKG clip/cable.  Present on Admission: No     On the day of the discharge the patient's vitals were stable, and no other acute medical condition were reported by patient. the patient was felt safe to be discharge at [facility].  Consultants: Neurology Procedures: None  Discharge Exam: General: Appear in no  distress, no Rash; Oral Mucosa Clear, moist. Cardiovascular: S1 and S2 Present, no Murmur, Respiratory: normal respiratory effort, Bilateral Air entry present and no Crackles, no wheezes Abdomen: Bowel Sound present, Soft and no tenderness, no hernia Extremities: no Pedal edema, no calf tenderness Neurology: alert and oriented to time, place, and person affect appropriate.  Filed Weights   03/09/22 0921  Weight: 48 kg   Vitals:   03/12/22 0815 03/12/22 1205  BP: (!) 168/52 (!) 145/53  Pulse: (!) 59 60  Resp: 16 16  Temp: 98.1 F (36.7 C) 97.8 F (36.6 C)  SpO2: 98% 96%    DISCHARGE MEDICATION: Allergies as of 03/12/2022       Reactions   Clopidogrel Nausea And Vomiting    reported by [place] 11/24/13   Omeprazole Other (See Comments)   Unknown reaction - reported by [place] 11/24/13        Medication List     TAKE these medications    acetaminophen 325 MG tablet Commonly known as: TYLENOL Take 1 tablet (325 mg total) by mouth every 4 (four) hours as needed for mild pain (or temp >/= 101 F).   apixaban 2.5 MG Tabs tablet Commonly known as: ELIQUIS Take 1 tablet (2.5 mg total) by mouth 2 (two) times daily.   citalopram 20 MG tablet Commonly known as:  CELEXA Take 30 mg by mouth daily.   docusate sodium 100 MG capsule Commonly known as: COLACE Take 100 mg by mouth 2 (two) times daily.   feeding supplement Liqd Take 237 mLs by mouth 2 (two) times daily between meals.   ferrous sulfate 325 (65 FE) MG tablet Take 1 tablet (325 mg total) by mouth 2 (two) times daily with a meal.   gabapentin 100 MG capsule Commonly known as: NEURONTIN Take 200 mg by mouth 3 (three) times daily.   lamoTRIgine 100 MG tablet Commonly known as: LAMICTAL Take 100 mg by mouth every 12 (twelve) hours.   levETIRAcetam 750 MG tablet Commonly known as: KEPPRA Take 1 tablet (750 mg total) by mouth 2 (two) times daily. Change to 1000 mg po  BID when creatinine clearance is >50 ml/min What changed:  medication strength how much to take additional instructions   levothyroxine 25 MCG tablet Commonly known as: SYNTHROID Take 25 mcg by mouth daily before breakfast.   lisinopril 10 MG tablet Commonly known as: ZESTRIL Take 1 tablet (10 mg total) by mouth daily.   Melatonin 5 MG Caps Take 5 mg by mouth at bedtime.   metoprolol tartrate 50 MG tablet Commonly known as: LOPRESSOR Take 1 tablet (50 mg total) by mouth 2 (two) times daily.   multivitamin with minerals Tabs tablet Take 1 tablet by mouth daily.   ondansetron 4 MG disintegrating tablet Commonly known as: ZOFRAN-ODT Take 1 tablet (4 mg total) by mouth every 8 (eight) hours as needed for nausea or vomiting.   QUEtiapine 25 MG tablet Commonly known as: SEROQUEL Take 25 mg by mouth 2 (two) times daily.   simvastatin 40 MG tablet Commonly known as: ZOCOR Take 1 tablet (40 mg total) by mouth daily at 6 PM.   Vitamin D3 25 MCG (1000 UT) Caps Take 1,000 Units by mouth 2 (two) times a day.       Allergies  Allergen Reactions   Clopidogrel Nausea And Vomiting     reported by [place] 11/24/13   Omeprazole Other (See Comments)    Unknown reaction - reported by [place] 11/24/13   Discharge Instructions     Call MD for:   Complete by: As directed    Seizure activity, altered mental status, headache or dizziness   Call MD for:  difficulty breathing, headache or visual disturbances   Complete by: As directed    Call MD for:  extreme fatigue   Complete by: As directed    Call MD for:  persistant dizziness or light-headedness   Complete by: As directed    Call MD for:  persistant nausea and vomiting   Complete by: As directed    Call MD for:  severe uncontrolled pain   Complete by: As directed    Call MD for:  temperature >100.4   Complete by: As directed    Diet - low sodium heart healthy   Complete by: As  directed    Discharge instructions   Complete by: As directed    Follow-up with PCP, patient should be seen by an MD in 1 to 2 days, continue to monitor creatinine clearance and adjust the dose of Keppra.  It can be increased to 1000 mg p.o. twice daily when creatinine clearance is greater than 50 ml/min Follow with a neurologist in 1 to 2 weeks.   Increase activity slowly   Complete by: As directed  The results of significant diagnostics from this hospitalization (including imaging, microbiology, ancillary and laboratory) are listed below for reference.    Significant Diagnostic Studies: DG Chest Port 1 View  Result Date: 03/09/2022 CLINICAL DATA:  Altered mental status EXAM: PORTABLE CHEST 1 VIEW COMPARISON:  05/19/2021 FINDINGS: The heart size and mediastinal contours are within normal limits. Both lungs are clear. Fracture deformity of the proximal left humerus, which has not been previously imaged but is subacute to chronic appearing. IMPRESSION: 1. No acute abnormality of the lungs. 2. Fracture deformity of the proximal left humerus, which has not been previously imaged but is subacute to chronic appearing. Electronically Signed   By: Delanna Ahmadi M.D.   On: 03/09/2022 12:14   CT Head Wo Contrast  Result Date: 03/08/2022 CLINICAL DATA:  Mental status change EXAM: CT HEAD WITHOUT CONTRAST TECHNIQUE: Contiguous axial images were obtained from the base of the skull through the vertex without intravenous contrast. RADIATION DOSE REDUCTION: This exam was performed according to the departmental dose-optimization program which includes automated exposure control, adjustment of the mA and/or kV according to patient size and/or use of iterative reconstruction technique. COMPARISON:  CT head 05/08/2021.  MRI brain 10/13/2017. FINDINGS: Brain: Encephalomalacia in the right frontal temporal and parietal regions as well as right insula appear unchanged compatible with old infarct. There is no  evidence for acute infarct, hemorrhage or extra-axial fluid collection. Small areas of old infarct in the left cerebellum appear new from prior. Mild diffuse atrophy again noted. Old lacunar infarct and mild periventricular white matter hypodensity appears similar to prior. There is no hydrocephalus or midline shift. Vascular: Atherosclerotic calcifications are present within the cavernous internal carotid arteries. Skull: Normal. Negative for fracture or focal lesion. Sinuses/Orbits: No acute finding. Other: None. IMPRESSION: 1. No acute intracranial process. 2. Old infarcts in the right cerebral hemisphere and left cerebellum. 3. Atrophy and chronic small vessel ischemic changes. Electronically Signed   By: Ronney Asters M.D.   On: 03/08/2022 20:11    Microbiology: No results found for this or any previous visit (from the past 240 hour(s)).   Labs: CBC: Recent Labs  Lab 03/08/22 2100 03/10/22 0536 03/11/22 0502 03/11/22 1914 03/12/22 0524  WBC 10.5 8.9 10.4  --  11.2*  HGB 12.5 12.7 12.3 12.8 13.1  HCT 40.6 41.1 38.6  --  40.4  MCV 94.0 94.3 90.4  --  90.0  PLT 221 223 229  --  628   Basic Metabolic Panel: Recent Labs  Lab 03/08/22 2100 03/10/22 0536 03/11/22 0502 03/12/22 0524  NA 141 140 140 137  K 4.2 4.1 4.0 3.9  CL 107 110 110 107  CO2 '26 22 23 '$ 19*  GLUCOSE 140* 87 98 85  BUN 42* '19 16 14  '$ CREATININE 1.76* 0.88 0.86 0.88  CALCIUM 8.5* 8.3* 8.8* 8.7*  MG 2.3  --   --  1.8  PHOS  --   --   --  2.7   Liver Function Tests: Recent Labs  Lab 03/08/22 2100  AST 16  ALT 15  ALKPHOS 69  BILITOT 0.4  PROT 5.9*  ALBUMIN 3.3*   No results for input(s): "LIPASE", "AMYLASE" in the last 168 hours. No results for input(s): "AMMONIA" in the last 168 hours. Cardiac Enzymes: No results for input(s): "CKTOTAL", "CKMB", "CKMBINDEX", "TROPONINI" in the last 168 hours. BNP (last 3 results) No results for input(s): "BNP" in the last 8760 hours. CBG: Recent Labs  Lab  03/08/22 1947 03/12/22 1201  GLUCAP 141* 135*    Time spent: 35 minutes  Signed:  Val Riles  [facility] 03/12/2022 12:50 PM
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
## 2022-05-13 ENCOUNTER — Emergency Department: Payer: Medicare Other

## 2022-05-13 ENCOUNTER — Inpatient Hospital Stay
Admission: EM | Admit: 2022-05-13 | Discharge: 2022-05-16 | DRG: 871 | Disposition: A | Discharge status: Skilled Nursing Facility | Payer: Medicare Other | Source: Skilled Nursing Facility | Attending: Internal Medicine | Admitting: Internal Medicine

## 2022-05-13 ENCOUNTER — Other Ambulatory Visit: Payer: Self-pay

## 2022-05-13 ENCOUNTER — Inpatient Hospital Stay: Payer: Medicare Other

## 2022-05-13 DIAGNOSIS — Z515 Encounter for palliative care: Secondary | ICD-10-CM

## 2022-05-13 DIAGNOSIS — F32A Depression, unspecified: Secondary | ICD-10-CM | POA: Diagnosis present

## 2022-05-13 DIAGNOSIS — A419 Sepsis, unspecified organism: Secondary | ICD-10-CM | POA: Diagnosis present

## 2022-05-13 DIAGNOSIS — I2510 Atherosclerotic heart disease of native coronary artery without angina pectoris: Secondary | ICD-10-CM | POA: Diagnosis present

## 2022-05-13 DIAGNOSIS — Z8249 Family history of ischemic heart disease and other diseases of the circulatory system: Secondary | ICD-10-CM

## 2022-05-13 DIAGNOSIS — Z66 Do not resuscitate: Secondary | ICD-10-CM | POA: Diagnosis present

## 2022-05-13 DIAGNOSIS — Z6820 Body mass index (BMI) 20.0-20.9, adult: Secondary | ICD-10-CM

## 2022-05-13 DIAGNOSIS — N179 Acute kidney failure, unspecified: Secondary | ICD-10-CM | POA: Diagnosis present

## 2022-05-13 DIAGNOSIS — E440 Moderate protein-calorie malnutrition: Secondary | ICD-10-CM | POA: Insufficient documentation

## 2022-05-13 DIAGNOSIS — Z79899 Other long term (current) drug therapy: Secondary | ICD-10-CM

## 2022-05-13 DIAGNOSIS — Z823 Family history of stroke: Secondary | ICD-10-CM

## 2022-05-13 DIAGNOSIS — I82409 Acute embolism and thrombosis of unspecified deep veins of unspecified lower extremity: Secondary | ICD-10-CM | POA: Diagnosis present

## 2022-05-13 DIAGNOSIS — E860 Dehydration: Secondary | ICD-10-CM | POA: Diagnosis present

## 2022-05-13 DIAGNOSIS — F0390 Unspecified dementia without behavioral disturbance: Secondary | ICD-10-CM | POA: Diagnosis present

## 2022-05-13 DIAGNOSIS — I639 Cerebral infarction, unspecified: Secondary | ICD-10-CM | POA: Diagnosis present

## 2022-05-13 DIAGNOSIS — Z87891 Personal history of nicotine dependence: Secondary | ICD-10-CM

## 2022-05-13 DIAGNOSIS — Z8041 Family history of malignant neoplasm of ovary: Secondary | ICD-10-CM

## 2022-05-13 DIAGNOSIS — E785 Hyperlipidemia, unspecified: Secondary | ICD-10-CM | POA: Diagnosis present

## 2022-05-13 DIAGNOSIS — R569 Unspecified convulsions: Secondary | ICD-10-CM | POA: Diagnosis not present

## 2022-05-13 DIAGNOSIS — R6520 Severe sepsis without septic shock: Secondary | ICD-10-CM | POA: Diagnosis present

## 2022-05-13 DIAGNOSIS — T17908A Unspecified foreign body in respiratory tract, part unspecified causing other injury, initial encounter: Secondary | ICD-10-CM

## 2022-05-13 DIAGNOSIS — R7989 Other specified abnormal findings of blood chemistry: Secondary | ICD-10-CM | POA: Diagnosis not present

## 2022-05-13 DIAGNOSIS — J690 Pneumonitis due to inhalation of food and vomit: Secondary | ICD-10-CM | POA: Diagnosis present

## 2022-05-13 DIAGNOSIS — I69354 Hemiplegia and hemiparesis following cerebral infarction affecting left non-dominant side: Secondary | ICD-10-CM

## 2022-05-13 DIAGNOSIS — Z1152 Encounter for screening for COVID-19: Secondary | ICD-10-CM | POA: Diagnosis not present

## 2022-05-13 DIAGNOSIS — G9341 Metabolic encephalopathy: Secondary | ICD-10-CM | POA: Diagnosis not present

## 2022-05-13 DIAGNOSIS — F0393 Unspecified dementia, unspecified severity, with mood disturbance: Secondary | ICD-10-CM | POA: Diagnosis present

## 2022-05-13 DIAGNOSIS — Z8619 Personal history of other infectious and parasitic diseases: Secondary | ICD-10-CM

## 2022-05-13 DIAGNOSIS — Z79890 Hormone replacement therapy (postmenopausal): Secondary | ICD-10-CM

## 2022-05-13 DIAGNOSIS — K219 Gastro-esophageal reflux disease without esophagitis: Secondary | ICD-10-CM | POA: Diagnosis present

## 2022-05-13 DIAGNOSIS — R112 Nausea with vomiting, unspecified: Secondary | ICD-10-CM | POA: Diagnosis present

## 2022-05-13 DIAGNOSIS — Z90710 Acquired absence of both cervix and uterus: Secondary | ICD-10-CM

## 2022-05-13 DIAGNOSIS — Z5329 Procedure and treatment not carried out because of patient's decision for other reasons: Secondary | ICD-10-CM | POA: Diagnosis not present

## 2022-05-13 DIAGNOSIS — E039 Hypothyroidism, unspecified: Secondary | ICD-10-CM | POA: Diagnosis present

## 2022-05-13 DIAGNOSIS — E43 Unspecified severe protein-calorie malnutrition: Secondary | ICD-10-CM | POA: Diagnosis present

## 2022-05-13 DIAGNOSIS — Z86718 Personal history of other venous thrombosis and embolism: Secondary | ICD-10-CM

## 2022-05-13 DIAGNOSIS — K449 Diaphragmatic hernia without obstruction or gangrene: Secondary | ICD-10-CM | POA: Diagnosis present

## 2022-05-13 DIAGNOSIS — Z7901 Long term (current) use of anticoagulants: Secondary | ICD-10-CM | POA: Diagnosis not present

## 2022-05-13 DIAGNOSIS — E872 Acidosis, unspecified: Secondary | ICD-10-CM | POA: Diagnosis present

## 2022-05-13 DIAGNOSIS — Z853 Personal history of malignant neoplasm of breast: Secondary | ICD-10-CM

## 2022-05-13 DIAGNOSIS — M47812 Spondylosis without myelopathy or radiculopathy, cervical region: Secondary | ICD-10-CM | POA: Diagnosis present

## 2022-05-13 DIAGNOSIS — I10 Essential (primary) hypertension: Secondary | ICD-10-CM | POA: Diagnosis present

## 2022-05-13 DIAGNOSIS — Z888 Allergy status to other drugs, medicaments and biological substances status: Secondary | ICD-10-CM

## 2022-05-13 LAB — COMPREHENSIVE METABOLIC PANEL
ALT: 14 U/L (ref 0–44)
AST: 23 U/L (ref 15–41)
Albumin: 3.6 g/dL (ref 3.5–5.0)
Alkaline Phosphatase: 85 U/L (ref 38–126)
Anion gap: 11 (ref 5–15)
BUN: 26 mg/dL — ABNORMAL HIGH (ref 8–23)
CO2: 21 mmol/L — ABNORMAL LOW (ref 22–32)
Calcium: 9.1 mg/dL (ref 8.9–10.3)
Chloride: 105 mmol/L (ref 98–111)
Creatinine, Ser: 1.67 mg/dL — ABNORMAL HIGH (ref 0.44–1.00)
GFR, Estimated: 29 mL/min — ABNORMAL LOW (ref >60)
Glucose, Bld: 123 mg/dL — ABNORMAL HIGH (ref 70–99)
Potassium: 4.4 mmol/L (ref 3.5–5.1)
Sodium: 137 mmol/L (ref 135–145)
Total Bilirubin: 0.8 mg/dL (ref 0.3–1.2)
Total Protein: 6.7 g/dL (ref 6.5–8.1)

## 2022-05-13 LAB — URINALYSIS, ROUTINE W REFLEX MICROSCOPIC
Bacteria, UA: NONE SEEN
Bilirubin Urine: NEGATIVE
Glucose, UA: NEGATIVE mg/dL
Hgb urine dipstick: NEGATIVE
Ketones, ur: NEGATIVE mg/dL
Leukocytes,Ua: NEGATIVE
Nitrite: NEGATIVE
Protein, ur: 100 mg/dL — AB
Specific Gravity, Urine: 1.02 (ref 1.005–1.030)
pH: 5 (ref 5.0–8.0)

## 2022-05-13 LAB — CBC
HCT: 40.5 % (ref 36.0–46.0)
Hemoglobin: 12.7 g/dL (ref 12.0–15.0)
MCH: 29.3 pg (ref 26.0–34.0)
MCHC: 31.4 g/dL (ref 30.0–36.0)
MCV: 93.3 fL (ref 80.0–100.0)
Platelets: 237 10*3/uL (ref 150–400)
RBC: 4.34 MIL/uL (ref 3.87–5.11)
RDW: 12.6 % (ref 11.5–15.5)
WBC: 14.8 10*3/uL — ABNORMAL HIGH (ref 4.0–10.5)
nRBC: 0 % (ref 0.0–0.2)

## 2022-05-13 LAB — RESP PANEL BY RT-PCR (FLU A&B, COVID) ARPGX2
Influenza A by PCR: NEGATIVE
Influenza B by PCR: NEGATIVE
SARS Coronavirus 2 by RT PCR: NEGATIVE

## 2022-05-13 LAB — CBG MONITORING, ED
Glucose-Capillary: 134 mg/dL — ABNORMAL HIGH (ref 70–99)
Glucose-Capillary: 90 mg/dL (ref 70–99)
Glucose-Capillary: 76 mg/dL (ref 70–99)
Glucose-Capillary: 86 mg/dL (ref 70–99)

## 2022-05-13 LAB — LACTIC ACID, PLASMA
Lactic Acid, Venous: 4.6 mmol/L (ref 0.5–1.9)
Lactic Acid, Venous: 1.1 mmol/L (ref 0.5–1.9)

## 2022-05-13 MED ORDER — SODIUM CHLORIDE 0.9 % IV BOLUS
500.0000 mL | Freq: Once | INTRAVENOUS | Status: AC
  Administered 2022-05-13: 500 mL via INTRAVENOUS

## 2022-05-13 MED ORDER — VITAMIN D 25 MCG (1000 UNIT) PO TABS
1000.0000 [IU] | ORAL_TABLET | Freq: Every day | ORAL | Status: DC
  Administered 2022-05-14 – 2022-05-16 (×3): 1000 [IU] via ORAL
  Filled 2022-05-13 (×3): qty 1

## 2022-05-13 MED ORDER — ONDANSETRON HCL 4 MG/2ML IJ SOLN: 4.0000 mg | Freq: Three times a day (TID) | INTRAMUSCULAR | Status: DC | PRN

## 2022-05-13 MED ORDER — WHITE PETROLATUM EX OINT: TOPICAL_OINTMENT | Freq: Three times a day (TID) | CUTANEOUS | Status: DC | PRN

## 2022-05-13 MED ORDER — SODIUM CHLORIDE 0.9 % IV SOLN
2.0000 g | Freq: Once | INTRAVENOUS | Status: DC
  Filled 2022-05-13: qty 12.5

## 2022-05-13 MED ORDER — ENSURE ENLIVE PO LIQD
237.0000 mL | Freq: Two times a day (BID) | ORAL | Status: DC
  Administered 2022-05-15 – 2022-05-16 (×4): 237 mL via ORAL

## 2022-05-13 MED ORDER — MIDODRINE HCL 5 MG PO TABS
10.0000 mg | ORAL_TABLET | Freq: Three times a day (TID) | ORAL | Status: DC
  Filled 2022-05-13: qty 2

## 2022-05-13 MED ORDER — HYDRALAZINE HCL 20 MG/ML IJ SOLN: 5.0000 mg | INTRAMUSCULAR | Status: DC | PRN

## 2022-05-13 MED ORDER — APIXABAN 2.5 MG PO TABS
2.5000 mg | ORAL_TABLET | Freq: Two times a day (BID) | ORAL | Status: DC
  Administered 2022-05-14 – 2022-05-16 (×5): 2.5 mg via ORAL
  Filled 2022-05-13 (×7): qty 1

## 2022-05-13 MED ORDER — ADULT MULTIVITAMIN W/MINERALS CH
1.0000 | ORAL_TABLET | Freq: Every day | ORAL | Status: DC
  Administered 2022-05-14 – 2022-05-16 (×3): 1 via ORAL
  Filled 2022-05-13 (×3): qty 1

## 2022-05-13 MED ORDER — ACETAMINOPHEN 325 MG PO TABS: 650.0000 mg | ORAL_TABLET | Freq: Four times a day (QID) | ORAL | Status: DC | PRN

## 2022-05-13 MED ORDER — CITALOPRAM HYDROBROMIDE 20 MG PO TABS
20.0000 mg | ORAL_TABLET | Freq: Every day | ORAL | Status: DC
  Administered 2022-05-14 – 2022-05-16 (×3): 20 mg via ORAL
  Filled 2022-05-13 (×3): qty 1

## 2022-05-13 MED ORDER — LORAZEPAM 2 MG/ML IJ SOLN: 1.0000 mg | INTRAMUSCULAR | Status: DC | PRN

## 2022-05-13 MED ORDER — SODIUM CHLORIDE 0.9 % IV SOLN
750.0000 mg | Freq: Two times a day (BID) | INTRAVENOUS | Status: DC
  Administered 2022-05-13 – 2022-05-15 (×5): 750 mg via INTRAVENOUS
  Filled 2022-05-13 (×7): qty 7.5

## 2022-05-13 MED ORDER — DOCUSATE SODIUM 100 MG PO CAPS
100.0000 mg | ORAL_CAPSULE | Freq: Two times a day (BID) | ORAL | Status: DC
  Administered 2022-05-14 – 2022-05-16 (×5): 100 mg via ORAL
  Filled 2022-05-13 (×5): qty 1

## 2022-05-13 MED ORDER — LAMOTRIGINE 100 MG PO TABS
100.0000 mg | ORAL_TABLET | Freq: Two times a day (BID) | ORAL | Status: DC
  Administered 2022-05-14 – 2022-05-16 (×5): 100 mg via ORAL
  Filled 2022-05-13 (×5): qty 1

## 2022-05-13 MED ORDER — LEVOTHYROXINE SODIUM 25 MCG PO TABS
25.0000 ug | ORAL_TABLET | Freq: Every day | ORAL | Status: DC
  Administered 2022-05-15 – 2022-05-16 (×2): 25 ug via ORAL
  Filled 2022-05-13 (×2): qty 1

## 2022-05-13 MED ORDER — DERMACLOUD EX OINT: 1.0000 "application " | TOPICAL_OINTMENT | Freq: Three times a day (TID) | CUTANEOUS | Status: DC

## 2022-05-13 MED ORDER — ACETAMINOPHEN 650 MG RE SUPP: 650.0000 mg | Freq: Four times a day (QID) | RECTAL | Status: DC | PRN

## 2022-05-13 MED ORDER — ALBUTEROL SULFATE (2.5 MG/3ML) 0.083% IN NEBU: 2.5000 mg | INHALATION_SOLUTION | RESPIRATORY_TRACT | Status: DC | PRN

## 2022-05-13 MED ORDER — QUETIAPINE FUMARATE 25 MG PO TABS
25.0000 mg | ORAL_TABLET | Freq: Every day | ORAL | Status: DC
  Administered 2022-05-14 – 2022-05-15 (×2): 25 mg via ORAL
  Filled 2022-05-13 (×2): qty 1

## 2022-05-13 MED ORDER — SODIUM CHLORIDE 0.9 % IV SOLN
3.0000 g | Freq: Two times a day (BID) | INTRAVENOUS | Status: DC
  Administered 2022-05-13 – 2022-05-16 (×6): 3 g via INTRAVENOUS
  Filled 2022-05-13: qty 8
  Filled 2022-05-13: qty 3
  Filled 2022-05-13 (×2): qty 8
  Filled 2022-05-13: qty 3
  Filled 2022-05-13 (×2): qty 8

## 2022-05-13 MED ORDER — SODIUM CHLORIDE 0.9 % IV SOLN: INTRAVENOUS | Status: DC

## 2022-05-13 MED ORDER — DM-GUAIFENESIN ER 30-600 MG PO TB12: 1.0000 | ORAL_TABLET | Freq: Two times a day (BID) | ORAL | Status: DC | PRN

## 2022-05-13 MED ORDER — MELATONIN 5 MG PO TABS
5.0000 mg | ORAL_TABLET | Freq: Every day | ORAL | Status: DC
  Administered 2022-05-14 – 2022-05-15 (×2): 5 mg via ORAL
  Filled 2022-05-13 (×2): qty 1

## 2022-05-13 MED ORDER — APIXABAN 2.5 MG PO TABS: 2.5000 mg | ORAL_TABLET | Freq: Two times a day (BID) | ORAL | Status: DC

## 2022-05-13 MED ORDER — SIMVASTATIN 20 MG PO TABS
40.0000 mg | ORAL_TABLET | Freq: Every day | ORAL | Status: DC
  Administered 2022-05-15: 40 mg via ORAL
  Filled 2022-05-13 (×2): qty 2
  Filled 2022-05-13: qty 4

## 2022-05-13 NOTE — ED Notes (Signed)
Pt repositioned on to right side  

## 2022-05-13 NOTE — Consult Note (Signed)
Pharmacy Antibiotic Note  Sonya [name] is a 89 y.o. female admitted on 05/13/2022 with aspiration pneumonia.  Pharmacy has been consulted for Unasyn dosing.  Plan: -Start Unasyn 3 grams IV every 12 hours -Follow renal function for needed adjustments  Weight: 49.9 kg (110 lb)  Temp (24hrs), Avg:97.4 F (36.3 C), Min:97.4 F (36.3 C), Max:97.4 F (36.3 C)  Recent Labs  Lab 05/13/22 1348  WBC 14.8*  CREATININE 1.67*  LATICACIDVEN 4.6*    Estimated Creatinine Clearance: 18.7 mL/min (A) (by C-G formula based on SCr of 1.67 mg/dL (H)).    Allergies  Allergen Reactions   Clopidogrel Nausea And Vomiting     reported by [place] 11/24/13   Omeprazole Other (See Comments)    Unknown reaction - reported by [place] 11/24/13    Antimicrobials this admission: Unasyn 3/26 >>  Dose adjustments this admission: N/A  Microbiology results: 3/26 BCx: pending  Thank you for allowing pharmacy to be a part of this patient's care.  Lorin Picket, PharmD 05/13/2022 3:54 PM

## 2022-05-13 NOTE — H&P (Addendum)
History and Physical    Emika Cavil [id number] DOB: [birth year] DOA: 05/13/2022  Referring MD/NP/PA:   PCP: Baxter Hire, MD   Patient coming from:  The patient is coming from SNF  Chief Complaint: worsening mental status, nausea, vomiting  HPI: Sonya [name] is a 89 y.o. female with medical history significant of stroke with left-sided weakness, hypertension, hyperlipidemia, CAD, hypothyroidism, dementia, depression, esophageal stricture, PE on Eliquis, remote breast cancer, seizure, who presents with worsening mental status, dementia and vomiting.  Per her grandson (I called her grandson by phone), at her normal baseline, patient recognizes family members, sometimes orientated to place, but not orientated to the time normally.  Grandson states that patient's mental status has been declining recently. Per facility report, pt was found to be in the day room with vomit on her, and was not less responsive, no witnessed seizures today. When I saw pt in ED, she is barely arousable, not oriented x 3.  Patient has chronic left-sided weakness with left arm contracture.  No active respiratory distress, active nausea, vomiting or diarrhea noted.  Not sure if patient has chest pain or abdominal pain, but she seems to have tenderness on palpation.  Not sure if patient has symptoms of UTI.  Data reviewed independently and ED Course: pt was found to have WBC 14.8, lactic acid 4.6, negative COVID PCR, urinalysis (cloudy appearance, negative leukocyte, negative bacteria, WBC 6-10), AKI with creatinine 1.67, BUN 26, GFR 29 (recent baseline creatinine 0.88 on 03/12/2022) temperature 97.4, blood pressure 117/41, heart rate 59, RR 12, oxygen saturation 98% on room air.  Chest x-ray negative.  CT head showed old infarction but negative for acute issues.  CT of C-spine negative for acute injury, but showed aspiration.  Patient is admitted to PCU as inpatient.  CT-C spin: No recent fracture is seen in  cervical spine. Cervical spondylosis with encroachment of neural foramina from C3-C7 levels.   There is debris in the dependent portion of lumen of trachea suggesting possible aspiration.   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   EKG: Not done in ED, will get one.    Review of Systems: Could not be reviewed due to dementia and altered mental status.  Allergy:  Allergies  Allergen Reactions   Clopidogrel Nausea And Vomiting     reported by [facility] 11/24/13   Omeprazole Other (See Comments)    Unknown reaction - reported by [place] 11/24/13    Past Medical History:  Diagnosis Date   CAD (coronary artery disease)    Carpal tunnel syndrome    GERD (gastroesophageal reflux disease)    History of shingles    Hormone receptor positive breast cancer ([place]) 1960   Hyperlipemia    Hypertension    Hypothyroidism    Stroke ([place])    left sided weakness    Past Surgical History:  Procedure Laterality Date   ABDOMINAL HYSTERECTOMY     APPENDECTOMY     CAROTID ANGIOGRAPHY Bilateral 03/31/2018   Procedure: CAROTID ANGIOGRAPHY;  Surgeon: Algernon Huxley, MD;  Location: [facility];  Service: Cardiovascular;  Laterality: Bilateral;   cornoary angioplasty     ENDARTERECTOMY Left 05/19/2018   Procedure: ENDARTERECTOMY CAROTID;  Surgeon: Algernon Huxley, MD;  Location: [facility];  Service: Vascular;  Laterality: Left;   ENDOSCOPIC RETROGRADE CHOLANGIOPANCREATOGRAPHY (ERCP) WITH PROPOFOL N/A 11/10/2017   Procedure: ENDOSCOPIC RETROGRADE CHOLANGIOPANCREATOGRAPHY (ERCP) WITH PROPOFOL;  Surgeon:  Lucilla Lame, MD;  Location: [facility];  Service: Endoscopy;  Laterality: N/A;   ESOPHAGOGASTRODUODENOSCOPY N/A 03/23/2018   Procedure: ESOPHAGOGASTRODUODENOSCOPY (EGD);  Surgeon: Lin Landsman, MD;  Location: [facility];  Service: Gastroenterology;  Laterality: N/A;   ESOPHAGOGASTRODUODENOSCOPY (EGD) WITH  PROPOFOL N/A 11/06/2017   Procedure: ESOPHAGOGASTRODUODENOSCOPY (EGD) WITH PROPOFOL;  Surgeon: Lucilla Lame, MD;  Location: [facility];  Service: Endoscopy;  Laterality: N/A;   PATCH ANGIOPLASTY Left 05/19/2018   Procedure: PATCH ANGIOPLASTY;  Surgeon: Algernon Huxley, MD;  Location: [facility];  Service: Vascular;  Laterality: Left;    Social History:  reports that she quit smoking about 24 years ago. Her smoking use included cigarettes. She has a 10.00 pack-year smoking history. She has never used smokeless tobacco. She reports that she does not currently use alcohol. She reports that she does not use drugs.  Family History:  Family History  Problem Relation Age of Onset   Hypertension Son    Ovarian cancer Mother    Stroke Father    Stroke Son      Prior to Admission medications   Medication Sig Start Date End Date Taking? Authorizing Provider  acetaminophen (TYLENOL) 325 MG tablet Take 1 tablet (325 mg total) by mouth every 4 (four) hours as needed for mild pain (or temp >/= 101 F). 05/20/18  Yes Stegmayer, Janalyn Harder, PA-C  apixaban (ELIQUIS) 2.5 MG TABS tablet Take 1 tablet (2.5 mg total) by mouth 2 (two) times daily. 03/24/18  Yes Epifanio Lesches, MD  Cholecalciferol (VITAMIN D3) 1000 units CAPS Take 1,000 Units by mouth 2 (two) times a day.  09/07/17  Yes [provider]  citalopram (CELEXA) 20 MG tablet Take 20 mg by mouth daily. 02/06/21  Yes [provider]  docusate sodium (COLACE) 100 MG capsule Take 100 mg by mouth 2 (two) times daily.   Yes [provider]  ferrous sulfate 325 (65 FE) MG tablet Take 1 tablet (325 mg total) by mouth 2 (two) times daily with a meal. 04/12/18  Yes Gladstone Lighter, MD  gabapentin (NEURONTIN) 100 MG capsule Take 200 mg by mouth 3 (three) times daily.   Yes [provider]  Infant Care Products [facility]) OINT Apply 1 application  topically 3 (three) times daily.   Yes [provider]  lamoTRIgine  (LAMICTAL) 100 MG tablet Take 100 mg by mouth every 12 (twelve) hours. 11/13/20  Yes [provider]  levETIRAcetam (KEPPRA) 100 MG/ML solution Take 7.5-10 mLs by mouth every 12 (twelve) hours. 7.5 ml by mouth every 12 hours. Change to 1000 mg BID when creatinine clearance is >12ml/min   Yes [provider]  levothyroxine (SYNTHROID, LEVOTHROID) 25 MCG tablet Take 25 mcg by mouth daily before breakfast.  09/10/17  Yes [provider]  lisinopril (ZESTRIL) 10 MG tablet Take 1 tablet (10 mg total) by mouth daily. 05/23/21  Yes Lorella Nimrod, MD  Melatonin 5 MG CAPS Take 5 mg by mouth at bedtime.   Yes [provider]  metoprolol tartrate (LOPRESSOR) 50 MG tablet Take 1 tablet (50 mg total) by mouth 2 (two) times daily. 05/22/21  Yes Lorella Nimrod, MD  Multiple Vitamin (MULTIVITAMIN WITH MINERALS) TABS tablet Take 1 tablet by mouth daily. 05/22/21  Yes Lorella Nimrod, MD  ondansetron (ZOFRAN-ODT) 4 MG disintegrating tablet Take 1 tablet (4 mg total) by mouth every 8 (eight) hours as needed for nausea or vomiting. 02/24/18  Yes Ojie, Jude, MD  QUEtiapine (SEROQUEL) 50 MG tablet Take 20 mg  by mouth at bedtime. 03/02/22  Yes [provider]  simvastatin (ZOCOR) 40 MG tablet Take 1 tablet (40 mg total) by mouth daily at 6 PM. 02/24/18  Yes Ojie, Jude, MD  feeding supplement, ENSURE ENLIVE, (ENSURE ENLIVE) LIQD Take 237 mLs by mouth 2 (two) times daily between meals. 06/27/18   Steve Rattler, DO  levETIRAcetam (KEPPRA) 750 MG tablet Take 1 tablet (750 mg total) by mouth 2 (two) times daily. Change to 1000 mg po BID when creatinine clearance is >50 ml/min Patient not taking: Reported on 05/13/2022 03/12/22 06/10/22  Val Riles, MD    Physical Exam: Vitals:   05/13/22 1413 05/13/22 1515 05/13/22 1600 05/13/22 1757  BP:   (!) 109/47 (!) 113/35  Pulse:  (!) 59 63 63  Resp:  11 10 12   Temp: (!) 97.4 F (36.3 C)     TempSrc: Rectal     SpO2:  98% 97% 95%  Weight:        General: Not in acute distress HEENT:       Eyes: PERRL, EOMI, no scleral icterus.       ENT: No discharge from the ears and nose       Neck: No JVD, no bruit, no mass felt. Heme: No neck lymph node enlargement. Cardiac: S1/S2, RRR, No murmurs, No gallops or rubs. Respiratory: No rales, wheezing, rhonchi or rubs. GI: Soft, nondistended, nontender, no organomegaly, BS present. GU: No hematuria Ext: No pitting leg edema bilaterally. 1+DP/PT pulse bilaterally. Musculoskeletal: No joint redness or warmth Skin: No rashes.  Neuro: Has altered mental status, barely arousable, not oriented x 3, has left-sided weakness with left arm contracture, moves all extremities on painful stimuli. Psych: Patient is not psychotic, no suicidal or hemocidal ideation.  Labs on Admission: I have personally reviewed following labs and imaging studies  CBC: Recent Labs  Lab 05/13/22 1348  WBC 14.8*  HGB 12.7  HCT 40.5  MCV 93.3  PLT [id number]   Basic Metabolic Panel: Recent Labs  Lab 05/13/22 1348  NA 137  K 4.4  CL 105  CO2 21*  GLUCOSE 123*  BUN 26*  CREATININE 1.67*  CALCIUM 9.1   GFR: Estimated Creatinine Clearance: 18.7 mL/min (A) (by C-G formula based on SCr of 1.67 mg/dL (H)). Liver Function Tests: Recent Labs  Lab 05/13/22 1348  AST 23  ALT 14  ALKPHOS 85  BILITOT 0.8  PROT 6.7  ALBUMIN 3.6   No results for input(s): "LIPASE", "AMYLASE" in the last 168 hours. No results for input(s): "AMMONIA" in the last 168 hours. Coagulation Profile: No results for input(s): "INR", "PROTIME" in the last 168 hours. Cardiac Enzymes: No results for input(s): "CKTOTAL", "CKMB", "CKMBINDEX", "TROPONINI" in the last 168 hours. BNP (last 3 results) No results for input(s): "PROBNP" in the last 8760 hours. HbA1C: No results for input(s): "HGBA1C" in the last 72 hours. CBG: Recent Labs  Lab 05/13/22 1304 05/13/22 1533 05/13/22 1802  GLUCAP 134* 90 76   Lipid Profile: No results for  input(s): "CHOL", "HDL", "LDLCALC", "TRIG", "CHOLHDL", "LDLDIRECT" in the last 72 hours. Thyroid Function Tests: No results for input(s): "TSH", "T4TOTAL", "FREET4", "T3FREE", "THYROIDAB" in the last 72 hours. Anemia Panel: No results for input(s): "VITAMINB12", "FOLATE", "FERRITIN", "TIBC", "IRON", "RETICCTPCT" in the last 72 hours. Urine analysis:    Component Value Date/Time   COLORURINE AMBER (A) 05/13/2022 1353   APPEARANCEUR CLOUDY (A) 05/13/2022 1353   LABSPEC 1.020 05/13/2022 1353   PHURINE 5.0 05/13/2022 1353  GLUCOSEU NEGATIVE 05/13/2022 [place] 05/13/2022 1353   [place] 05/13/2022 1353   KETONESUR NEGATIVE 05/13/2022 1353   PROTEINUR 100 (A) 05/13/2022 1353   NITRITE NEGATIVE 05/13/2022 1353   LEUKOCYTESUR NEGATIVE 05/13/2022 1353   Sepsis Labs: @LABRCNTIP (procalcitonin:4,lacticidven:4) ) Recent Results (from the past 240 hour(s))  Resp Panel by RT-PCR (Flu A&B, Covid) Anterior Nasal Swab     Status: None   Collection Time: 05/13/22  1:52 PM   Specimen: Anterior Nasal Swab  Result Value Ref Range Status   SARS Coronavirus 2 by RT PCR NEGATIVE NEGATIVE Final    Comment: (NOTE) SARS-CoV-2 target nucleic acids are NOT DETECTED.  The SARS-CoV-2 RNA is generally detectable in upper respiratory specimens during the acute phase of infection. The lowest concentration of SARS-CoV-2 viral copies this assay can detect is 138 copies/mL. A negative result does not preclude SARS-Cov-2 infection and should not be used as the sole basis for treatment or other patient management decisions. A negative result may occur with  improper specimen collection/handling, submission of specimen other than nasopharyngeal swab, presence of viral mutation(s) within the areas targeted by this assay, and inadequate number of viral copies(<138 copies/mL). A negative result must be combined with clinical observations, patient history, and epidemiological information.  The expected result is Negative.  Fact Sheet for Patients:  EntrepreneurPulse.com.au  Fact Sheet for Healthcare Providers:  IncredibleEmployment.be  This test is no t yet approved or cleared by the [place] FDA and  has been authorized for detection and/or diagnosis of SARS-CoV-2 by FDA under an Emergency Use Authorization (EUA). This EUA will remain  in effect (meaning this test can be used) for the duration of the COVID-19 declaration under Section 564(b)(1) of the Act, 21 U.S.C.section 360bbb-3(b)(1), unless the authorization is terminated  or revoked sooner.       Influenza A by PCR NEGATIVE NEGATIVE Final   Influenza B by PCR NEGATIVE NEGATIVE Final    Comment: (NOTE) The Xpert Xpress SARS-CoV-2/FLU/RSV plus assay is intended as an aid in the diagnosis of influenza from Nasopharyngeal swab specimens and should not be used as a sole basis for treatment. Nasal washings and aspirates are unacceptable for Xpert Xpress SARS-CoV-2/FLU/RSV testing.  Fact Sheet for Patients: EntrepreneurPulse.com.au  Fact Sheet for Healthcare Providers: IncredibleEmployment.be  This test is not yet approved or cleared by the [place] FDA and has been authorized for detection and/or diagnosis of SARS-CoV-2 by FDA under an Emergency Use Authorization (EUA). This EUA will remain in effect (meaning this test can be used) for the duration of the COVID-19 declaration under Section 564(b)(1) of the Act, 21 U.S.C. section 360bbb-3(b)(1), unless the authorization is terminated or revoked.  Performed at [facility], [address] [place], [place] [address]      Radiological Exams on Admission: DG Chest [facility] 1 View  Result Date: 05/13/2022 CLINICAL DATA:  Altered mental status EXAM: PORTABLE CHEST 1 VIEW COMPARISON:  Chest radiograph 03/09/2022 FINDINGS: The cardiomediastinal silhouette is stable. There is  no focal consolidation or pulmonary edema. There is no pleural effusion or pneumothorax There is no acute osseous abnormality. A prior fracture of the proximal left humerus and distal right clavicle are unchanged. IMPRESSION: Stable chest with no radiographic evidence of acute cardiopulmonary process. Electronically Signed   By: Valetta Mole M.D.   On: 05/13/2022 15:41   CT Cervical Spine Wo Contrast  Result Date: 05/13/2022 CLINICAL DATA:  Trauma, altered mental status EXAM: CT CERVICAL SPINE WITHOUT CONTRAST TECHNIQUE:  Multidetector CT imaging of the cervical spine was performed without intravenous contrast. Multiplanar CT image reconstructions were also generated. RADIATION DOSE REDUCTION: This exam was performed according to the departmental dose-optimization program which includes automated exposure control, adjustment of the mA and/or kV according to patient size and/or use of iterative reconstruction technique. COMPARISON:  05/08/2021 FINDINGS: Alignment: Alignment of posterior margins of vertebral bodies is within normal limits. Skull base and vertebrae: No recent fracture is seen. There are smooth marginated calcifications posterior to the spinous processes of C4, C5 and C7 vertebrae with no interval change. Soft tissues and spinal canal: There is extrinsic pressure over the ventral margin of thecal sac caused by posterior bony spurs at C4-C5 and C5-C6 levels. Disc levels: There is encroachment of neural foramina by bony spurs from C3 to C7 levels. Upper chest: There is ectasia of the aortic arch measuring 4 cm in maximum diameter. Extensive calcifications are seen in thoracic aorta and its major branches. Small amount of debris is seen in the dependent portion of the lumen of trachea. Other: Thyroid is smaller than usual with inhomogeneous attenuation. IMPRESSION: No recent fracture is seen in cervical spine. Cervical spondylosis with encroachment of neural foramina from C3-C7 levels. There is debris  in the dependent portion of lumen of trachea suggesting possible aspiration. 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 Electronically Signed   By: Elmer Picker M.D.   On: 05/13/2022 14:51   CT Head Wo Contrast  Result Date: 05/13/2022 CLINICAL DATA:  Altered mental status EXAM: CT HEAD WITHOUT CONTRAST TECHNIQUE: Contiguous axial images were obtained from the base of the skull through the vertex without intravenous contrast. RADIATION DOSE REDUCTION: This exam was performed according to the departmental dose-optimization program which includes automated exposure control, adjustment of the mA and/or kV according to patient size and/or use of iterative reconstruction technique. COMPARISON:  03/08/2022 FINDINGS: Brain: No acute intracranial findings are seen. There are no signs of bleeding within the cranium. There is large old infarct in right MCA distribution in right parietal lobe. There is also encephalomalacia in the left cerebellum suggesting old infarct. Minimal calcifications are seen in basal ganglia. No significant interval changes are noted. Vascular: Scattered arterial calcifications are seen. Skull: No acute findings are seen. Sinuses/Orbits: Unremarkable. Other: No significant interval changes are noted. IMPRESSION: No acute intracranial findings are seen. Old infarcts are seen in right cerebral and left cerebellar hemispheres. Atrophy. No significant interval changes are noted. Electronically Signed   By: Elmer Picker M.D.   On: 05/13/2022 14:44      Assessment/Plan Principal Problem:   Aspiration pneumonia (HCC) Active Problems:   Nausea & vomiting   Elevated lactic acid level   Seizure (HCC)   Acute metabolic encephalopathy   Hypertension   Hypothyroidism   Hyperlipidemia   CVA (cerebral vascular accident) ([place])   AKI (acute kidney injury) ([place])   Depression   Dementia without behavioral disturbance ([place])   DVT (deep venous  thrombosis) ([place])   Protein-calorie malnutrition, severe   Assessment and Plan:  Aspiration pneumonia [facility]): Patient has leukocytosis with WBC 14.8, but does not meet criteria for sepsis (heart rate of 59, RR 12, temperature 97.4).  Lactic acid is elevated, 4.6.  No oxygen desaturation.  - Will admit to PCU as inpt - start Unasyn - Mucinex for cough  - Bronchodilators - Follow up blood culture x2, sputum culture - Check respiratory virus panel, plus Flu pcr - will  get Procalcitonin and trend lactic acid level  - IVF:  500 cc x 4 of NS bolus in ED, followed by 75 mL per hour  Nausea & vomiting: Etiology is not clear.  Patient seems to have some abdominal tenderness on palpation. -Follow-up CT of abdomen/pelvis -As needed Zofran -IV fluid as above  Elevated lactic acid level: Lactic acid 4.6, does not meet criteria for sepsis.  Partially due to dehydration. -Trend lactic acid level -IV fluid as above  Seizure (HCC) -Seizure precaution -As needed Ativan for seizure -Switch oral Keppra to IV 750 mg twice daily -Continue Lamictal  Acute metabolic encephalopathy: CT head negative for acute issues. -Frequent neurocheck -Fall precaution  Hypertension: Blood pressure is soft, 117/41 -IV hydralazine as needed -Hold lisinopril and metoprolol   Hypothyroidism -Synthroid  Hyperlipidemia -Zocor  CVA (cerebral vascular accident) ([place]) -Zocor  AKI (acute kidney injury) ([place]): Likely due to dehydration and continuation of her lisinopril -IV fluid as above -Hold lisinopril  Depression -Continue home medications  Dementia without behavioral disturbance ([place]) -Fall precaution  Hx of DVT: -Elqiuis  Protein-calorie malnutrition, severe: Body weight 49.9 kg, BMI 20.12 -Ensure -Nutrition consult     DVT ppx: on Eliquuis  Code Status: DNR (pt has DNR form from facility)  Family Communication:   Yes, patient's grandson via phone by phone  Disposition Plan:  Anticipate  discharge back to previous environment  Consults called:  none  Admission status and Level of care: Progressive:    as inpt     Dispo: The patient is from: SNF              Anticipated d/c is to: SNF              Anticipated d/c date is: 2 days              Patient currently is not medically stable to d/c.    Severity of Illness:  The appropriate patient status for this patient is INPATIENT. Inpatient status is judged to be reasonable and necessary in order to provide the required intensity of service to ensure the patient's safety. The patient's presenting symptoms, physical exam findings, and initial radiographic and laboratory data in the context of their chronic comorbidities is felt to place them at high risk for further clinical deterioration. Furthermore, it is not anticipated that the patient will be medically stable for discharge from the hospital within 2 midnights of admission.   * I certify that at the point of admission it is my clinical judgment that the patient will require inpatient hospital care spanning beyond 2 midnights from the point of admission due to high intensity of service, high risk for further deterioration and high frequency of surveillance required.*       Date of Service 05/13/2022    Ivor Costa [facility]   If 7PM-7AM, please contact night-coverage [email or web address] 05/13/2022, 6:06 PM

## 2022-05-13 NOTE — ED Notes (Signed)
MD Blaine Hamper made aware of pts decreasing BP and BG.

## 2022-05-13 NOTE — ED Notes (Signed)
Lab notified to attempt to obtain labs due to pt being a hard stick.

## 2022-05-13 NOTE — ED Notes (Signed)
Pt repositioned onto left side.

## 2022-05-13 NOTE — ED Provider Notes (Signed)
[facility] Provider Note   Event Date/Time   First MD Initiated Contact with Patient 05/13/22 1252     (approximate)  History   Altered Mental Status  HPI  Sonya [name] is a 89 y.o. female  ***       Physical Exam   Triage Vital Signs: ED Triage Vitals  Enc Vitals Group     BP 05/13/22 1248 (!) 117/41     Pulse Rate 05/13/22 1242 (!) 57     Resp 05/13/22 1242 12     Temp --      Temp src --      SpO2 --      Weight 05/13/22 1244 110 lb (49.9 kg)     Height --      Head Circumference --      Peak Flow --      Pain Score --      Pain Loc --      Pain Edu? --      Excl. in [place]? --     Most recent vital signs: Vitals:   05/13/22 1413 05/13/22 1515  BP:    Pulse:  (!) 59  Resp:  11  Temp: (!) 97.4 F (36.3 C)   SpO2:  98%    {Only need to document appropriate and relevant physical exam:1} General: Awake, no distress. *** CV:  Good peripheral perfusion. *** Resp:  Normal effort. *** Abd:  No distention. *** Other:  ***   ED Results / Procedures / Treatments   Labs (all labs ordered are listed, but only abnormal results are displayed) Labs Reviewed  COMPREHENSIVE METABOLIC PANEL - Abnormal; Notable for the following components:      Result Value   CO2 21 (*)    Glucose, Bld 123 (*)    BUN 26 (*)    Creatinine, Ser 1.67 (*)    GFR, Estimated 29 (*)    All other components within normal limits  CBC - Abnormal; Notable for the following components:   WBC 14.8 (*)    All other components within normal limits  LACTIC ACID, PLASMA - Abnormal; Notable for the following components:   Lactic Acid, Venous 4.6 (*)    All other components within normal limits  URINALYSIS, ROUTINE W REFLEX MICROSCOPIC - Abnormal; Notable for the following components:   Color, Urine AMBER (*)    APPearance CLOUDY (*)    Protein, ur 100 (*)    All other components within normal limits  CBG MONITORING, ED - Abnormal; Notable for the following  components:   Glucose-Capillary 134 (*)    All other components within normal limits  RESP PANEL BY RT-PCR (FLU A&B, COVID) ARPGX2  CULTURE, BLOOD (ROUTINE X 2)  CULTURE, BLOOD (ROUTINE X 2)  LACTIC ACID, PLASMA  CBG MONITORING, ED  CBG MONITORING, ED  CBG MONITORING, ED     EKG  ***   RADIOLOGY *** {USE THE WORD "INTERPRETED"!! You MUST document your own interpretation of imaging, as well as the fact that you reviewed the radiologist's report!:1}   PROCEDURES:  Critical Care performed: {CriticalCareYesNo:19197::"Yes, see critical care procedure note(s)","No"}  Procedures   MEDICATIONS ORDERED IN ED: Medications  Ampicillin-Sulbactam (UNASYN) 3 g in sodium chloride 0.9 % 100 mL IVPB (has no administration in time range)  0.9 %  sodium chloride infusion (has no administration in time range)  albuterol (PROVENTIL) (2.5 MG/3ML) 0.083% nebulizer solution 2.5 mg (has no administration in time range)  dextromethorphan-guaiFENesin [facility]  DM) 30-600 MG per 12 hr tablet 1 tablet (has no administration in time range)  acetaminophen (TYLENOL) suppository 650 mg (has no administration in time range)  acetaminophen (TYLENOL) tablet 650 mg (has no administration in time range)  ondansetron (ZOFRAN) injection 4 mg (has no administration in time range)  hydrALAZINE (APRESOLINE) injection 5 mg (has no administration in time range)  sodium chloride 0.9 % bolus 500 mL (0 mLs Intravenous Stopped 05/13/22 1458)  sodium chloride 0.9 % bolus 500 mL (500 mLs Intravenous New Bag/Given 05/13/22 1458)  sodium chloride 0.9 % bolus 500 mL (500 mLs Intravenous New Bag/Given 05/13/22 1535)     IMPRESSION / MDM / ASSESSMENT AND PLAN / ED COURSE  I reviewed the triage vital signs and the nursing notes.                              Differential diagnosis includes, but is not limited to, ***  Patient's presentation is most consistent with {EM COPA:27473}  *** {If the patient is on the monitor,  remove the brackets and asterisks on the sentence below and remember to document it as a Procedure as well. Otherwise delete the sentence below:1} {**The patient is on the cardiac monitor to evaluate for evidence of arrhythmia and/or significant heart rate changes.**} {Remember to include, when applicable, any/all of the following data: independent review of imaging independent review of labs (comment specifically on pertinent positives and negatives) review of specific prior hospitalizations, PCP/specialist notes, etc. discuss meds given and prescribed document any discussion with consultants (including hospitalists) any clinical decision tools you used and why (PECARN, NEXUS, etc.) did you consider admitting the patient? document social determinants of health affecting patient's care (homelessness, inability to follow up in a timely fashion, etc) document any pre-existing conditions increasing risk on current visit (e.g. diabetes and HTN increasing danger of high-risk chest pain/ACS) describes what meds you gave (especially parenteral) and why any other interventions?:1} Clinical Course as of 05/13/22 1556  Tue May 13, 2022  1432 Patient's son present.  Advises mom is speaking 1 or 2 wishes to him, but seems fatigued.  Normally is more conversant.  Chronic left-sided weakness.  He advises he was told that she was found fatigued and had vomited.  Reports history of seizures in the past reports in the past possibly postictal like or she has had similar with "dehydration as well". [MQ]  1521 Prior CVA with L sided weakness, facility sent for AMS and emesis in wheelchair.  Does have a history of seizures, no witnessed seizures today. LA elevated but no obvious source of infection.  Concern for possible aspiration - given 1L of IVF.  Blood cx and abx started.  Plan for admission    [SM]    Clinical Course User Index [MQ] Delman Kitten, MD [SM] Nathaniel Man, MD     FINAL CLINICAL IMPRESSION(S) /  ED DIAGNOSES   Final diagnoses:  Sepsis, due to unspecified organism, unspecified whether acute organ dysfunction present [facility])  Aspiration into respiratory tract, initial encounter     Rx / DC Orders   ED Discharge Orders     None        Note:  This document was prepared using Dragon voice recognition software and may include unintentional dictation errors.

## 2022-05-13 NOTE — ED Notes (Signed)
Pt had BM. RN x 2 cleaned pt, preformed peri care and placed pt in clean diaper.

## 2022-05-13 NOTE — ED Notes (Signed)
Lab notified of lactic acid redraw due to pt being hard stick. Lab states it would be retimed due to late collect of first lactic due to hard stick.

## 2022-05-13 NOTE — ED Triage Notes (Addendum)
Pt arrives to the ED via EMS from [facility]. Per EMS, pt was found to be in the day room with vomit on her and not responding as normal. Pt baseline is A/Ox2.

## 2022-05-13 NOTE — ED Notes (Signed)
RN called to check on when lab is coming to redraw for lactic. Lab said someone will come.

## 2022-05-13 NOTE — ED Provider Notes (Signed)
Care assumed of patient from outgoing provider.  See their note for initial history, exam and plan.  1521 Prior CVA with L sided weakness, facility sent for AMS and emesis in wheelchair.  Does have a history of seizures, no witnessed seizures today. LA elevated but no obvious source of infection.  Concern for possible aspiration - given 1L of IVF.  Blood cx and abx started.  Plan for admission    [SM]     Nathaniel Man, MD 05/13/22 1525

## 2022-05-13 NOTE — Progress Notes (Signed)
[facility] will follow for sepsis protocol.

## 2022-05-13 NOTE — ED Notes (Signed)
Per Valetta Fuller, NP, patient okay to not have dose of eliquis tonight and okay to miss all other nighttime medications. Will reassess ability to swallow in AM.

## 2022-05-13 NOTE — ED Notes (Signed)
Lab notified to obtain lab work.

## 2022-05-13 NOTE — Consult Note (Signed)
PHARMACY -  BRIEF ANTIBIOTIC NOTE   Pharmacy has received consult(s) for cefepime dosing from an ED provider.  The patient's profile has been reviewed for ht/wt/allergies/indication/available labs.    One time order(s) placed for Cefepime 2 grams IV x 1  Further antibiotics/pharmacy consults should be ordered by admitting physician if indicated.                       Thank you, Lorin Picket, PharmD 05/13/2022  3:27 PM

## 2022-05-13 NOTE — ED Notes (Signed)
MD nui aware/updated on pts vital signs.

## 2022-05-13 NOTE — Progress Notes (Signed)
       CROSS COVER NOTE  NAME: Sonya [name] MRN: [id number] DOB : September 16, 1932 ATTENDING PHYSICIAN: Ivor Costa, MD    Date of Service   05/13/2022   HPI/Events of Note   Report/Request *** Patient unsafe for PO intake On Review of chart ***patient was barely arousable and responsive to painful stimuli when seen by admitting physician. Keppra ordered IV tonight Bedside eval*** HPI***  Interventions   Assessment/Plan:  Reevaluate safety for PO intake tomorrow Consider transitioning meds to IV as needed X    *** professional thanks      To reach the provider On-Call:   7AM- 7PM see care teams to locate the attending and reach out to them via [email or web address] Password: [email or web address] 7PM-7AM contact night-coverage If you still have difficulty reaching the appropriate provider, please page the [facility] (Director on Call) for [facility] on amion for assistance  This document was prepared using Systems analyst and may include unintentional dictation errors.  Neomia Glass DNP, MBA, FNP-BC, PMHNP-BC Nurse Practitioner [facility] Pager [phone]

## 2022-05-13 NOTE — ED Notes (Signed)
Per Blaine Hamper, MD, notify provider when blood pressure MAP is below 60.

## 2022-05-13 NOTE — ED Notes (Signed)
Floor coverage provider paged to inquire about whether patient's require subcut. Blood thinner injections since patient is NPO and unable to take oral Eliquis. Awaiting response.

## 2022-05-14 ENCOUNTER — Other Ambulatory Visit: Payer: Self-pay

## 2022-05-14 ENCOUNTER — Encounter: Payer: Self-pay | Admitting: Internal Medicine

## 2022-05-14 DIAGNOSIS — J690 Pneumonitis due to inhalation of food and vomit: Secondary | ICD-10-CM | POA: Diagnosis not present

## 2022-05-14 LAB — BRAIN NATRIURETIC PEPTIDE: B Natriuretic Peptide: 697.7 pg/mL — ABNORMAL HIGH (ref 0.0–100.0)

## 2022-05-14 LAB — MRSA NEXT GEN BY PCR, NASAL: MRSA by PCR Next Gen: DETECTED — AB

## 2022-05-14 LAB — LIPASE, BLOOD: Lipase: 31 U/L (ref 11–51)

## 2022-05-14 LAB — BASIC METABOLIC PANEL
Anion gap: 10 (ref 5–15)
BUN: 24 mg/dL — ABNORMAL HIGH (ref 8–23)
CO2: 23 mmol/L (ref 22–32)
Calcium: 8 mg/dL — ABNORMAL LOW (ref 8.9–10.3)
Chloride: 113 mmol/L — ABNORMAL HIGH (ref 98–111)
Creatinine, Ser: 1.26 mg/dL — ABNORMAL HIGH (ref 0.44–1.00)
GFR, Estimated: 41 mL/min — ABNORMAL LOW (ref >60)
Glucose, Bld: 81 mg/dL (ref 70–99)
Potassium: 4 mmol/L (ref 3.5–5.1)
Sodium: 146 mmol/L — ABNORMAL HIGH (ref 135–145)

## 2022-05-14 LAB — TSH: TSH: 1.665 u[IU]/mL (ref 0.350–4.500)

## 2022-05-14 LAB — CBC
HCT: 33.7 % — ABNORMAL LOW (ref 36.0–46.0)
Hemoglobin: 10.3 g/dL — ABNORMAL LOW (ref 12.0–15.0)
MCH: 29.2 pg (ref 26.0–34.0)
MCHC: 30.6 g/dL (ref 30.0–36.0)
MCV: 95.5 fL (ref 80.0–100.0)
Platelets: 187 10*3/uL (ref 150–400)
RBC: 3.53 MIL/uL — ABNORMAL LOW (ref 3.87–5.11)
RDW: 12.5 % (ref 11.5–15.5)
WBC: 7.6 10*3/uL (ref 4.0–10.5)
nRBC: 0 % (ref 0.0–0.2)

## 2022-05-14 LAB — PROCALCITONIN: Procalcitonin: <0.1 ng/mL

## 2022-05-14 LAB — AMMONIA: Ammonia: 16 umol/L (ref 9–35)

## 2022-05-14 LAB — VITAMIN B12: Vitamin B-12: 322 pg/mL (ref 180–914)

## 2022-05-14 LAB — CBG MONITORING, ED: Glucose-Capillary: 114 mg/dL — ABNORMAL HIGH (ref 70–99)

## 2022-05-14 MED ORDER — IPRATROPIUM-ALBUTEROL 0.5-2.5 (3) MG/3ML IN SOLN
3.0000 mL | Freq: Two times a day (BID) | RESPIRATORY_TRACT | Status: DC
  Administered 2022-05-14 (×2): 3 mL via RESPIRATORY_TRACT
  Filled 2022-05-14 (×3): qty 3

## 2022-05-14 MED ORDER — GUAIFENESIN 100 MG/5ML PO LIQD: 5.0000 mL | ORAL | Status: DC | PRN

## 2022-05-14 MED ORDER — DEXTROSE-NACL 5-0.45 % IV SOLN: INTRAVENOUS | Status: AC

## 2022-05-14 MED ORDER — PANTOPRAZOLE SODIUM 40 MG PO TBEC
40.0000 mg | DELAYED_RELEASE_TABLET | Freq: Every day | ORAL | Status: DC
  Administered 2022-05-15 – 2022-05-16 (×2): 40 mg via ORAL
  Filled 2022-05-14 (×2): qty 1

## 2022-05-14 MED ORDER — IPRATROPIUM-ALBUTEROL 0.5-2.5 (3) MG/3ML IN SOLN: 3.0000 mL | RESPIRATORY_TRACT | Status: DC | PRN

## 2022-05-14 MED ORDER — SENNOSIDES-DOCUSATE SODIUM 8.6-50 MG PO TABS: 1.0000 | ORAL_TABLET | Freq: Every evening | ORAL | Status: DC | PRN

## 2022-05-14 MED ORDER — TRAZODONE HCL 50 MG PO TABS: 50.0000 mg | ORAL_TABLET | Freq: Every evening | ORAL | Status: DC | PRN

## 2022-05-14 MED ORDER — METOPROLOL TARTRATE 5 MG/5ML IV SOLN: 5.0000 mg | INTRAVENOUS | Status: DC | PRN

## 2022-05-14 MED ORDER — HYDRALAZINE HCL 20 MG/ML IJ SOLN
10.0000 mg | INTRAMUSCULAR | Status: DC | PRN
  Administered 2022-05-15 – 2022-05-16 (×3): 10 mg via INTRAVENOUS
  Filled 2022-05-14 (×3): qty 1

## 2022-05-14 NOTE — ED Notes (Signed)
Pt had small BM and urine occurrence in brief. Writer changed brief and performed peri care. Pt turned onto left side.

## 2022-05-14 NOTE — Progress Notes (Signed)
PROGRESS NOTE    Sonya [name]  [id number] DOB: [birth year] DOA: 05/13/2022 PCP: Baxter Hire, MD   Brief Narrative:   89 y.o. female with medical history significant of stroke with left-sided weakness, hypertension, hyperlipidemia, CAD, hypothyroidism, dementia, depression, esophageal stricture, PE on Eliquis, remote breast cancer, seizure, who presents with worsening mental status, dementia and vomiting.  Upon admission patient was found to have acute kidney injury.  CT head, chest x-ray, CT cervical spine were negative.  There was concerns of aspiration therefore started on Unasyn.   Assessment & Plan:  Principal Problem:   Aspiration pneumonia ([place]) Active Problems:   Nausea & vomiting   Elevated lactic acid level   Seizure (HCC)   Acute metabolic encephalopathy   Hypertension   Hypothyroidism   Hyperlipidemia   CVA (cerebral vascular accident) ([place])   AKI (acute kidney injury) ([place])   Depression   Dementia without behavioral disturbance ([place])   DVT (deep venous thrombosis) (HCC)   Protein-calorie malnutrition, severe     Severe sepsis secondary to aspiration pneumonia ([place]) -Sepsis physiology is improving.  Obvious concerns of aspiration at this time.  Bronchodilators, aspiration precaution.  On Unasyn.  Check procalcitonin, BNP.  Flu, COVID/RSV are negative.  UA is unremarkable. Gentle hydration as needed Speech and swallow.    Nausea & vomiting:  CT scan showing evidence of moderate size hiatal hernia. LFTs normal Check lipase   Elevated lactic acid level: Lactic acid 4.6, does not meet criteria for sepsis.  Partially due to dehydration. -Trend lactic acid level -IV fluid as above    AKI (acute kidney injury) ([place]):  -Likely from dehydration.  Admission creatinine 1.67, slowly improving   Seizure (HCC) -Seizure precaution, on Keppra and Lamictal   Acute metabolic encephalopathy:  CT head, cervical spine are negative.  Will check TSH, ammonia, B12,  folate   Hypertension:  Home lisinopril and metoprolol on hold.  IV as needed ordered. Slowly taken off due to enalapril.  Hypothyroidism -Synthroid   Hyperlipidemia -Zocor   CVA (cerebral vascular accident) ([place]) -Zocor    Depression -Continue home medications   Dementia without behavioral disturbance ([place]) -Fall precaution   Hx of DVT: -Elqiuis   Protein-calorie malnutrition, severe: Body weight 49.9 kg, BMI 20.12 -Ensure -Nutrition consult    DVT prophylaxis: Eliquis Code Status: DNR Family Communication:  Spoke with family over the phone.   Status is: Inpatient Cont hosp stay. On going eval for asp PNA     Subjective: Patient wants me to leave her alone, doesn't want to participate in conversation.    Examination:  General exam: Resting comfortably, easily arousable Respiratory system: b/l rhonchi, improved.  Cardiovascular system: S1 & S2 heard, RRR. No JVD, murmurs, rubs, gallops or clicks. No pedal edema. Gastrointestinal system: Abdomen is nondistended, soft and nontender. No organomegaly or masses felt. Normal bowel sounds heard. Central nervous system: Alert and oriented to name Extremities: Symmetric 4 x 5 power. Skin: No rashes, lesions or ulcers Psychiatry: Judgement and insight appear poor     Objective: Vitals:   05/14/22 0815 05/14/22 0830 05/14/22 0845 05/14/22 0900  BP:  (!) 124/42  (!) 134/55  Pulse: (!) 59 (!) 57 60 64  Resp: 14 10 13 12   Temp:      TempSrc:      SpO2: 95% 97% 95% 95%  Weight:        Intake/Output Summary (Last 24 hours) at 05/14/2022 0908 Last data filed at 05/14/2022 0507 Gross per 24 hour  Intake 2300 ml  Output --  Net 2300 ml   Filed Weights   05/13/22 1244  Weight: 49.9 kg     Data Reviewed:   CBC: Recent Labs  Lab 05/13/22 1348 05/14/22 0423  WBC 14.8* 7.6  HGB 12.7 10.3*  HCT 40.5 33.7*  MCV 93.3 95.5  PLT 237 [id number]   Basic Metabolic Panel: Recent Labs  Lab 05/13/22 1348  05/14/22 0423  NA 137 146*  K 4.4 4.0  CL 105 113*  CO2 21* 23  GLUCOSE 123* 81  BUN 26* 24*  CREATININE 1.67* 1.26*  CALCIUM 9.1 8.0*   GFR: Estimated Creatinine Clearance: 24.8 mL/min (A) (by C-G formula based on SCr of 1.26 mg/dL (H)). Liver Function Tests: Recent Labs  Lab 05/13/22 1348  AST 23  ALT 14  ALKPHOS 85  BILITOT 0.8  PROT 6.7  ALBUMIN 3.6   No results for input(s): "LIPASE", "AMYLASE" in the last 168 hours. No results for input(s): "AMMONIA" in the last 168 hours. Coagulation Profile: No results for input(s): "INR", "PROTIME" in the last 168 hours. Cardiac Enzymes: No results for input(s): "CKTOTAL", "CKMB", "CKMBINDEX", "TROPONINI" in the last 168 hours. BNP (last 3 results) No results for input(s): "PROBNP" in the last 8760 hours. HbA1C: No results for input(s): "HGBA1C" in the last 72 hours. CBG: Recent Labs  Lab 05/13/22 1304 05/13/22 1533 05/13/22 1802 05/13/22 1944  GLUCAP 134* 90 76 86   Lipid Profile: No results for input(s): "CHOL", "HDL", "LDLCALC", "TRIG", "CHOLHDL", "LDLDIRECT" in the last 72 hours. Thyroid Function Tests: No results for input(s): "TSH", "T4TOTAL", "FREET4", "T3FREE", "THYROIDAB" in the last 72 hours. Anemia Panel: No results for input(s): "VITAMINB12", "FOLATE", "FERRITIN", "TIBC", "IRON", "RETICCTPCT" in the last 72 hours. Sepsis Labs: Recent Labs  Lab 05/13/22 1348 05/13/22 1752  LATICACIDVEN 4.6* 1.1    Recent Results (from the past 240 hour(s))  Blood culture (routine x 2)     Status: None (Preliminary result)   Collection Time: 05/13/22  1:48 PM   Specimen: BLOOD  Result Value Ref Range Status   Specimen Description BLOOD RIGHT ANTECUBITAL  Final   Special Requests   Final    BOTTLES DRAWN AEROBIC AND ANAEROBIC Blood Culture adequate volume   Culture   Final    NO GROWTH < 24 HOURS Performed at [facility], [address], [place], [place] [address]    Report Status PENDING  Incomplete   Blood culture (routine x 2)     Status: None (Preliminary result)   Collection Time: 05/13/22  1:48 PM   Specimen: BLOOD  Result Value Ref Range Status   Specimen Description BLOOD BLOOD RIGHT ARM  Final   Special Requests   Final    BOTTLES DRAWN AEROBIC AND ANAEROBIC Blood Culture results may not be optimal due to an inadequate volume of blood received in culture bottles   Culture   Final    NO GROWTH < 24 HOURS Performed at [facility], [address] [place], [place] [address]    Report Status PENDING  Incomplete  Resp Panel by RT-PCR (Flu A&B, Covid) Anterior Nasal Swab     Status: None   Collection Time: 05/13/22  1:52 PM   Specimen: Anterior Nasal Swab  Result Value Ref Range Status   SARS Coronavirus 2 by RT PCR NEGATIVE NEGATIVE Final    Comment: (NOTE) SARS-CoV-2 target nucleic acids are NOT DETECTED.  The SARS-CoV-2 RNA is generally detectable in upper respiratory specimens during the  acute phase of infection. The lowest concentration of SARS-CoV-2 viral copies this assay can detect is 138 copies/mL. A negative result does not preclude SARS-Cov-2 infection and should not be used as the sole basis for treatment or other patient management decisions. A negative result may occur with  improper specimen collection/handling, submission of specimen other than nasopharyngeal swab, presence of viral mutation(s) within the areas targeted by this assay, and inadequate number of viral copies(<138 copies/mL). A negative result must be combined with clinical observations, patient history, and epidemiological information. The expected result is Negative.  Fact Sheet for Patients:  EntrepreneurPulse.com.au  Fact Sheet for Healthcare Providers:  IncredibleEmployment.be  This test is no t yet approved or cleared by the [place] FDA and  has been authorized for detection and/or diagnosis of SARS-CoV-2 by FDA under an Emergency  Use Authorization (EUA). This EUA will remain  in effect (meaning this test can be used) for the duration of the COVID-19 declaration under Section 564(b)(1) of the Act, 21 U.S.C.section 360bbb-3(b)(1), unless the authorization is terminated  or revoked sooner.       Influenza A by PCR NEGATIVE NEGATIVE Final   Influenza B by PCR NEGATIVE NEGATIVE Final    Comment: (NOTE) The Xpert Xpress SARS-CoV-2/FLU/RSV plus assay is intended as an aid in the diagnosis of influenza from Nasopharyngeal swab specimens and should not be used as a sole basis for treatment. Nasal washings and aspirates are unacceptable for Xpert Xpress SARS-CoV-2/FLU/RSV testing.  Fact Sheet for Patients: EntrepreneurPulse.com.au  Fact Sheet for Healthcare Providers: IncredibleEmployment.be  This test is not yet approved or cleared by the [place] FDA and has been authorized for detection and/or diagnosis of SARS-CoV-2 by FDA under an Emergency Use Authorization (EUA). This EUA will remain in effect (meaning this test can be used) for the duration of the COVID-19 declaration under Section 564(b)(1) of the Act, 21 U.S.C. section 360bbb-3(b)(1), unless the authorization is terminated or revoked.  Performed at [facility], [address] [place], [place] [address]          Radiology Studies: CT ABDOMEN PELVIS WO CONTRAST  Result Date: 05/13/2022 CLINICAL DATA:  Vomiting, nonbilious (Ped 1-17y) EXAM: CT ABDOMEN AND PELVIS WITHOUT CONTRAST TECHNIQUE: Multidetector CT imaging of the abdomen and pelvis was performed following the standard protocol without IV contrast. RADIATION DOSE REDUCTION: This exam was performed according to the departmental dose-optimization program which includes automated exposure control, adjustment of the mA and/or kV according to patient size and/or use of iterative reconstruction technique. COMPARISON:  CT abdomen pelvis 03/22/2018  FINDINGS: Lower chest: No acute abnormality. Coronary artery calcification. Interval increase in size of a moderate volume hiatal hernia. Hepatobiliary: No focal liver abnormality. No gallstones, gallbladder wall thickening, or pericholecystic fluid. No biliary dilatation. Pancreas: No focal lesion. Normal pancreatic contour. No surrounding inflammatory changes. No main pancreatic ductal dilatation. Spleen: Normal in size.  Nonspecific vague hypodensity. Adrenals/Urinary Tract: No adrenal nodule bilaterally. Atrophic left kidney. Renal cortical scarring and thinning of the left kidney. No nephrolithiasis and no hydronephrosis. Bilateral fluid density lesions likely represent simple renal cysts. Simple renal cysts, in the absence of clinically indicated signs/symptoms, require no independent follow-up. No ureterolithiasis or hydroureter. The urinary bladder is unremarkable. Stomach/Bowel: Stomach is within normal limits. No evidence of bowel wall thickening or dilatation. Colonic diverticulosis. The appendix is not definitely identified with no inflammatory changes in the right lower quadrant to suggest acute appendicitis. Vascular/Lymphatic: No abdominal aorta or iliac aneurysm. Severe atherosclerotic plaque of  the aorta and its branches. No abdominal, pelvic, or inguinal lymphadenopathy. Reproductive: Status post hysterectomy. No adnexal masses. Other: No intraperitoneal free fluid. No intraperitoneal free gas. No organized fluid collection. Musculoskeletal: No abdominal wall hernia or abnormality. No suspicious lytic or blastic osseous lesions. No acute displaced fracture. Multilevel degenerative changes of the spine. Chronic appearing compression fracture of the T12 on L1 levels. Degenerative changes of the partially visualized left upper extremity. IMPRESSION: 1. Interval increase in size of a moderate volume hiatal hernia. 2. Atrophic left kidney. 3.  Aortic Atherosclerosis (ICD10-I70.0). Electronically Signed    By: Iven Finn M.D.   On: 05/13/2022 18:14   DG Chest Port 1 View  Result Date: 05/13/2022 CLINICAL DATA:  Altered mental status EXAM: PORTABLE CHEST 1 VIEW COMPARISON:  Chest radiograph 03/09/2022 FINDINGS: The cardiomediastinal silhouette is stable. There is no focal consolidation or pulmonary edema. There is no pleural effusion or pneumothorax There is no acute osseous abnormality. A prior fracture of the proximal left humerus and distal right clavicle are unchanged. IMPRESSION: Stable chest with no radiographic evidence of acute cardiopulmonary process. Electronically Signed   By: Valetta Mole M.D.   On: 05/13/2022 15:41   CT Cervical Spine Wo Contrast  Result Date: 05/13/2022 CLINICAL DATA:  Trauma, altered mental status EXAM: CT CERVICAL SPINE WITHOUT CONTRAST TECHNIQUE: Multidetector CT imaging of the cervical spine was performed without intravenous contrast. Multiplanar CT image reconstructions were also generated. RADIATION DOSE REDUCTION: This exam was performed according to the departmental dose-optimization program which includes automated exposure control, adjustment of the mA and/or kV according to patient size and/or use of iterative reconstruction technique. COMPARISON:  05/08/2021 FINDINGS: Alignment: Alignment of posterior margins of vertebral bodies is within normal limits. Skull base and vertebrae: No recent fracture is seen. There are smooth marginated calcifications posterior to the spinous processes of C4, C5 and C7 vertebrae with no interval change. Soft tissues and spinal canal: There is extrinsic pressure over the ventral margin of thecal sac caused by posterior bony spurs at C4-C5 and C5-C6 levels. Disc levels: There is encroachment of neural foramina by bony spurs from C3 to C7 levels. Upper chest: There is ectasia of the aortic arch measuring 4 cm in maximum diameter. Extensive calcifications are seen in thoracic aorta and its major branches. Small amount of debris is seen  in the dependent portion of the lumen of trachea. Other: Thyroid is smaller than usual with inhomogeneous attenuation. IMPRESSION: No recent fracture is seen in cervical spine. Cervical spondylosis with encroachment of neural foramina from C3-C7 levels. There is debris in the dependent portion of lumen of trachea suggesting possible aspiration. 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 Electronically Signed   By: Elmer Picker M.D.   On: 05/13/2022 14:51   CT Head Wo Contrast  Result Date: 05/13/2022 CLINICAL DATA:  Altered mental status EXAM: CT HEAD WITHOUT CONTRAST TECHNIQUE: Contiguous axial images were obtained from the base of the skull through the vertex without intravenous contrast. RADIATION DOSE REDUCTION: This exam was performed according to the departmental dose-optimization program which includes automated exposure control, adjustment of the mA and/or kV according to patient size and/or use of iterative reconstruction technique. COMPARISON:  03/08/2022 FINDINGS: Brain: No acute intracranial findings are seen. There are no signs of bleeding within the cranium. There is large old infarct in right MCA distribution in right parietal lobe. There is also encephalomalacia in the left cerebellum suggesting old infarct. Minimal calcifications  are seen in basal ganglia. No significant interval changes are noted. Vascular: Scattered arterial calcifications are seen. Skull: No acute findings are seen. Sinuses/Orbits: Unremarkable. Other: No significant interval changes are noted. IMPRESSION: No acute intracranial findings are seen. Old infarcts are seen in right cerebral and left cerebellar hemispheres. Atrophy. No significant interval changes are noted. Electronically Signed   By: Elmer Picker M.D.   On: 05/13/2022 14:44        Scheduled Meds:  apixaban  2.5 mg Oral BID   cholecalciferol  1,000 Units Oral Daily   citalopram  20 mg Oral Daily    docusate sodium  100 mg Oral BID   feeding supplement  237 mL Oral BID BM   lamoTRIgine  100 mg Oral Q12H   levothyroxine  25 mcg Oral QAC breakfast   melatonin  5 mg Oral QHS   midodrine  10 mg Oral TID WC   multivitamin with minerals  1 tablet Oral Daily   QUEtiapine  25 mg Oral QHS   simvastatin  40 mg Oral q1800   Continuous Infusions:  sodium chloride 75 mL/hr at 05/13/22 1627   ampicillin-sulbactam (UNASYN) IV Stopped (05/14/22 0507)   levETIRAcetam Stopped (05/13/22 2133)     LOS: 1 day   Time spent= 35 mins    Alece Koppel Arsenio Loader, MD [facility]  If 7PM-7AM, please contact night-coverage  05/14/2022, 9:08 AM

## 2022-05-14 NOTE — Evaluation (Addendum)
Clinical/Bedside Swallow Evaluation Patient Details  Name: Sonya [name] MRN: [id number] Date of Birth: 1932/12/24  Today's Date: 05/14/2022 Time: SLP Start Time (ACUTE ONLY): 1100 SLP Stop Time (ACUTE ONLY): 1155 SLP Time Calculation (min) (ACUTE ONLY): 55 min  Past Medical History:  Past Medical History:  Diagnosis Date   CAD (coronary artery disease)    Carpal tunnel syndrome    GERD (gastroesophageal reflux disease)    History of shingles    Hormone receptor positive breast cancer ([place]) 1960   Hyperlipemia    Hypertension    Hypothyroidism    Stroke ([place])    left sided weakness   Past Surgical History:  Past Surgical History:  Procedure Laterality Date   ABDOMINAL HYSTERECTOMY     APPENDECTOMY     CAROTID ANGIOGRAPHY Bilateral 03/31/2018   Procedure: CAROTID ANGIOGRAPHY;  Surgeon: Algernon Huxley, MD;  Location: [facility];  Service: Cardiovascular;  Laterality: Bilateral;   cornoary angioplasty     ENDARTERECTOMY Left 05/19/2018   Procedure: ENDARTERECTOMY CAROTID;  Surgeon: Algernon Huxley, MD;  Location: [facility];  Service: Vascular;  Laterality: Left;   ENDOSCOPIC RETROGRADE CHOLANGIOPANCREATOGRAPHY (ERCP) WITH PROPOFOL N/A 11/10/2017   Procedure: ENDOSCOPIC RETROGRADE CHOLANGIOPANCREATOGRAPHY (ERCP) WITH PROPOFOL;  Surgeon: Lucilla Lame, MD;  Location: [facility];  Service: Endoscopy;  Laterality: N/A;   ESOPHAGOGASTRODUODENOSCOPY N/A 03/23/2018   Procedure: ESOPHAGOGASTRODUODENOSCOPY (EGD);  Surgeon: Lin Landsman, MD;  Location: [facility];  Service: Gastroenterology;  Laterality: N/A;   ESOPHAGOGASTRODUODENOSCOPY (EGD) WITH PROPOFOL N/A 11/06/2017   Procedure: ESOPHAGOGASTRODUODENOSCOPY (EGD) WITH PROPOFOL;  Surgeon: Lucilla Lame, MD;  Location: [facility];  Service: Endoscopy;  Laterality: N/A;   PATCH ANGIOPLASTY Left 05/19/2018   Procedure: PATCH ANGIOPLASTY;  Surgeon: Algernon Huxley, MD;  Location: [facility];  Service: Vascular;  Laterality: Left;    HPI:  Pt is a 89 y.o. female with medical history significant of Dementia, stroke with residual left-sided weakness, Hiatal Hernia, esophageal stricture, hypertension, hyperlipidemia, CAD, hypothyroidism, depression, PE on Eliquis, remote breast cancer, seizure, who presents with worsening mental status, dementia and Vomiting.      Per her grandson via TC per chart, pt can recognize some family members, sometimes orientated to place. Grandson stated that patient's mental status has been declining recently. Per facility report, pt was found to be in the day room with Vomit on her, and was not less responsive, no witnessed seizures.  In the ED, she is barely arousable, not oriented.  Patient has chronic left-sided weakness with left arm contracture from old CVA; unsure if any UTI issues.   CXR: Stable chest with no radiographic evidence of acute cardiopulmonary  process.    Assessment / Plan / Recommendation  Clinical Impression   Pt seen for BSE today. Pt currently NPO. Pt resting in bed in a semi-fetal position. Nonverbal except for 2-3 words; eyes tightly closed throughout eval. She did not follow instructions.    OF NOTE: Per chart review, pt with hx of oral phase dysphagia. Pt last seen by SLP, 06/2018 and 04/2021, with recommendation then for a pureed diet with thin liquids. Per the chart notes/Imaging, pt has a h/o poor oral intake at times; MALNUTRITION per Dietician note. ALSO pt has a MED size Hiatal Hernia -- noted the vomiting event leading to this hospitalization.     OF NOTE: pt has Dx'd Dementia; and is s/p old CVA w/ residual left-sided weakness. ANY Cognitive decline/Dementia can impact overall awareness/timing of swallow and safety during po tasks which  increases risk for aspiration, choking. Currently, pt's risk for aspiration can be reduced when following general aspiration precautions, Supervision w/ oral intake to check for clearing, and when using a modified diet consistency w/ puree  foods as was recommended in 2020.    Pt has upper denture in place, sparse lower dentition, min xerostomia (dried, peeling lips), and poor oral health/dental hygiene. Oral care provided during session.  Pt given trials of ice chips, pureed and thin liquids (via tsp, then straw) w/ MOD++ verbal/tactile cues given in setting of Oral Prep stage deficits. Noted at previous BSEs, pt required cues to "open mouth" to take the straw, boluses. Pt did not attempt to engage in feeding self.   Pt presents w/ s/s c/w MOD++ oral phase dysphagia c/b: lengthy oral phase and delayed oral transit time, increased gumming motion/movements, reduced oral awareness to accept boluses from tsp/straw despite cues. She appeared to clear orally after eventual A-P transfer and swallow; no overt oral residue noted when pt opened mouth intermittently to trials. Oral phase deficits are likely impacted by declined Cognitive status, Baseline Dementia. Pt's ability to meet her nutrition/hydration needs could be significantly impacted by pt's oral phase dysphagia and Cognitive presentation. No overt pharyngeal phase deficits were noted; no coughing, no decline in respiratory status during/post trials. O2 sats remained mid90s-97% throughout intake. Laryngeal excursion appeared [facility] though difficult to palpate d/t head turned.   Pt appears at an increased risk for aspiration/aspiration pneumonia as well as decreased ability to meet hydration/nutrition needs given advanced Cognitive decline, dental status, medical comorbidites (including stroke, GERD/Hiatal Hernia), and need for FULL assistance w/ feeding.    Recommend a dysphagia level 1 (Pureed foods), moistened foods; Thin liquids. Recommend aspiration precautions, checking for oral clearing b/t bites/sips. Pills crushed/whole in Puree for safer, easier swallowing. Pt will need assistance with oral care/denture care and FULL FEEDING AND SUPERVISION W/ ALL ORAL INTAKE d/t Cognitive status.     Consulted MD re: pt's presentation and Baseline status post eval; pt appears at/near her Baseline. Precautions posted in room/chart.  No further skilled ST services indicated. MD/NSG updated and will reconsult if any new needs arise. Recommend a Palliative Care consult for further education on the impact of Cognitive decline on oral intake, swallowing, and meeting nutrition/hydration needs.  MD agreed.  SLP Visit Diagnosis: Dysphagia, oral phase (R13.11) (Dementia baseline)    Aspiration Risk  Mild aspiration risk;Risk for inadequate nutrition/hydration    Diet Recommendation   dysphagia level 1 (Pureed foods), moistened foods; Thin liquids. Recommend aspiration precautions, checking for oral clearing b/t bites/sips. Assistance with oral care/denture care and FULL FEEDING AND SUPERVISION W/ ALL ORAL INTAKE d/t Cognitive status.  Medication Administration: Crushed with puree    Other  Recommendations Recommended Consults:  (Palliative Care; Dietician) Oral Care Recommendations: Oral care BID;Oral care before and after PO;Staff/trained caregiver to provide oral care    Recommendations for follow up therapy are one component of a multi-disciplinary discharge planning process, led by the attending physician.  Recommendations may be updated based on patient status, additional functional criteria and insurance authorization.  Follow up Recommendations No SLP follow up      Assistance Recommended at Discharge  full  Functional Status Assessment Patient has had a recent decline in their functional status and/or demonstrates limited ability to make significant improvements in function in a reasonable and predictable amount of time  Frequency and Duration  (n/a)   (n/a)       Prognosis Prognosis for improved  oropharyngeal function: Guarded (-Fair) Barriers to Reach Goals: Cognitive deficits;Language deficits;Time post onset;Severity of deficits;Behavior Barriers/Prognosis Comment: Dementia  baseline; Hiatal Hernia      Swallow Study   General Date of Onset: 05/13/22 HPI: Pt is a 89 y.o. female with medical history significant of Dementia, stroke with residual left-sided weakness, Hiatal Hernia, esophageal stricture, hypertension, hyperlipidemia, CAD, hypothyroidism, depression, PE on Eliquis, remote breast cancer, seizure, who presents with worsening mental status, dementia and Vomiting.      Per her grandson via TC per chart, pt can recognize some family members, sometimes orientated to place. Grandson stated that patient's mental status has been declining recently. Per facility report, pt was found to be in the day room with Vomit on her, and was not less responsive, no witnessed seizures.  In the ED, she is barely arousable, not oriented.  Patient has chronic left-sided weakness with left arm contracture from old CVA; unsure if any UTI issues.   CXR: Stable chest with no radiographic evidence of acute cardiopulmonary  process. Type of Study: Bedside Swallow Evaluation Previous Swallow Assessment: 2019, 2020, 2023 - pureed diet, thins Diet Prior to this Study: NPO Temperature Spikes Noted: No (wbc 7.6) Respiratory Status: Room air History of Recent Intubation: No Behavior/Cognition: Cooperative;Pleasant mood;Confused;Requires cueing;Doesn't follow directions (Eyes tightly closed; responsive to tactile stim at mouth) Oral Cavity Assessment: Dry (min at lips) Oral Care Completed by SLP: Yes Oral Cavity - Dentition: Missing dentition;Dentures, top (on bottom) Vision:  (n/a) Self-Feeding Abilities: Total assist Patient Positioning: Upright in bed (needed full positioning) Baseline Vocal Quality: Normal;Low vocal intensity (few single words/phonations) Volitional Cough: Cognitively unable to elicit Volitional Swallow: Unable to elicit    Oral/Motor/Sensory Function Overall Oral Motor/Sensory Function: Mild impairment Facial ROM: Reduced left (labial) Facial Symmetry: Abnormal  symmetry left Lingual ROM: Within Functional Limits Lingual Symmetry: Within Functional Limits Lingual Strength: Within Functional Limits (w/ bolus management)   Ice Chips Ice chips: Impaired Presentation: Spoon (fed; 4 trials) Oral Phase Impairments: Poor awareness of bolus (lengthy oral phase) Oral Phase Functional Implications: Prolonged oral transit Pharyngeal Phase Impairments:  (none)   Thin Liquid Thin Liquid: Impaired Presentation: Spoon;Straw (2 trials via spoon; ~4 ozs via straw) Oral Phase Impairments: Poor awareness of bolus (oral prep deficits) Oral Phase Functional Implications: Prolonged oral transit (gumming motions) Pharyngeal  Phase Impairments:  (none)    Nectar Thick Nectar Thick Liquid: Not tested   Honey Thick Honey Thick Liquid: Not tested   Puree Puree: Impaired Presentation: Spoon (fed; 10 trials) Oral Phase Impairments: Poor awareness of bolus (oral prep deficits) Oral Phase Functional Implications: Prolonged oral transit (gumming movements) Pharyngeal Phase Impairments:  (none)   Solid     Solid: Not tested        Orinda Kenner, MS, CCC-SLP Speech Language Pathologist Rehab Services; [place] [phone] (ascom) Yazleemar Strassner 05/14/2022,4:01 PM

## 2022-05-14 NOTE — ED Notes (Signed)
Pt moved to room 35 and placed on a hospital bed, brief changed and perineal care provided, pt tol well and new brief applied, skin intact to her bilat buttocks and perineal area. Pt covered with warm blankets for comfort

## 2022-05-14 NOTE — ED Notes (Signed)
Pt made npo by this rn in reference to previous provider Otilio Miu, MD note.

## 2022-05-14 NOTE — ED Notes (Signed)
Lab called to draw labs

## 2022-05-15 DIAGNOSIS — J690 Pneumonitis due to inhalation of food and vomit: Secondary | ICD-10-CM | POA: Diagnosis not present

## 2022-05-15 DIAGNOSIS — E440 Moderate protein-calorie malnutrition: Secondary | ICD-10-CM | POA: Insufficient documentation

## 2022-05-15 LAB — GLUCOSE, CAPILLARY
Glucose-Capillary: 111 mg/dL — ABNORMAL HIGH (ref 70–99)
Glucose-Capillary: 82 mg/dL (ref 70–99)
Glucose-Capillary: 84 mg/dL (ref 70–99)
Glucose-Capillary: 119 mg/dL — ABNORMAL HIGH (ref 70–99)
Glucose-Capillary: 100 mg/dL — ABNORMAL HIGH (ref 70–99)
Glucose-Capillary: 91 mg/dL (ref 70–99)

## 2022-05-15 LAB — BASIC METABOLIC PANEL
Anion gap: 7 (ref 5–15)
BUN: 17 mg/dL (ref 8–23)
CO2: 22 mmol/L (ref 22–32)
Calcium: 8.1 mg/dL — ABNORMAL LOW (ref 8.9–10.3)
Chloride: 110 mmol/L (ref 98–111)
Creatinine, Ser: 0.94 mg/dL (ref 0.44–1.00)
GFR, Estimated: 59 mL/min — ABNORMAL LOW (ref >60)
Glucose, Bld: 119 mg/dL — ABNORMAL HIGH (ref 70–99)
Potassium: 3.7 mmol/L (ref 3.5–5.1)
Sodium: 139 mmol/L (ref 135–145)

## 2022-05-15 LAB — CBC
HCT: 34.6 % — ABNORMAL LOW (ref 36.0–46.0)
Hemoglobin: 10.9 g/dL — ABNORMAL LOW (ref 12.0–15.0)
MCH: 29.3 pg (ref 26.0–34.0)
MCHC: 31.5 g/dL (ref 30.0–36.0)
MCV: 93 fL (ref 80.0–100.0)
Platelets: 200 10*3/uL (ref 150–400)
RBC: 3.72 MIL/uL — ABNORMAL LOW (ref 3.87–5.11)
RDW: 12.5 % (ref 11.5–15.5)
WBC: 8.5 10*3/uL (ref 4.0–10.5)
nRBC: 0 % (ref 0.0–0.2)

## 2022-05-15 LAB — MAGNESIUM: Magnesium: 1.7 mg/dL (ref 1.7–2.4)

## 2022-05-15 MED ORDER — MUPIROCIN 2 % EX OINT
1.0000 | TOPICAL_OINTMENT | Freq: Two times a day (BID) | CUTANEOUS | Status: DC
  Administered 2022-05-15 – 2022-05-16 (×2): 1 via NASAL
  Filled 2022-05-15: qty 22

## 2022-05-15 MED ORDER — CHLORHEXIDINE GLUCONATE CLOTH 2 % EX PADS
6.0000 | MEDICATED_PAD | Freq: Every day | CUTANEOUS | Status: DC
  Administered 2022-05-16: 6 via TOPICAL

## 2022-05-15 MED ORDER — SODIUM CHLORIDE 0.9 % IV SOLN: INTRAVENOUS | Status: DC | PRN

## 2022-05-15 NOTE — TOC Transition Note (Signed)
Transition of Care [facility]) - CM/SW Discharge Note   Patient Details  Name: Sonya [name] MRN: [id number] Date of Birth: May 03, 1932  Transition of Care [facility]) CM/SW Contact:  Laurena Slimmer, RN Phone Number: 05/15/2022, 2:46 PM   Clinical Narrative:    Patient is coming from [employer].          Patient Goals and CMS Choice      Discharge Placement                         Discharge Plan and Services Additional resources added to the After Visit Summary for                                       Social Determinants of Health (SDOH) Interventions SDOH Screenings   Tobacco Use: Medium Risk (05/14/2022)     Readmission Risk Interventions     No data to display

## 2022-05-15 NOTE — Progress Notes (Signed)
Pt refusing Labs this morning.

## 2022-05-15 NOTE — Progress Notes (Signed)
PROGRESS NOTE    Sonya [name]  [id number] DOB: May 09, 1932 DOA: 05/13/2022 PCP: Baxter Hire, MD   Brief Narrative:   89 y.o. female with medical history significant of stroke with left-sided weakness, hypertension, hyperlipidemia, CAD, hypothyroidism, dementia, depression, esophageal stricture, PE on Eliquis, remote breast cancer, seizure, who presents with worsening mental status, dementia and vomiting.  Upon admission patient was found to have acute kidney injury.  CT head, chest x-ray, CT cervical spine were negative.  There was concerns of aspiration therefore started on Unasyn.  Patient was seen by speech and swallow therapy who recommended dysphagia 1 diet.  PPI was added.  Due to recurrent issues with aspiration, advanced dementia and her comorbidities palliative care team was consulted.   Assessment & Plan:  Principal Problem:   Aspiration pneumonia ([place]) Active Problems:   Nausea & vomiting   Elevated lactic acid level   Seizure (HCC)   Acute metabolic encephalopathy   Hypertension   Hypothyroidism   Hyperlipidemia   CVA (cerebral vascular accident) ([place])   AKI (acute kidney injury) ([place])   Depression   Dementia without behavioral disturbance ([place])   DVT (deep venous thrombosis) (HCC)   Protein-calorie malnutrition, severe     Severe sepsis secondary to aspiration pneumonia ([place]) -Sepsis physiology is improving.  Obvious concerns of aspiration at this time.  Bronchodilators, aspiration precaution.  On Unasyn.  Procalcitonin negative, BNP slightly elevated but clinical signs of dehydration.  Flu, COVID/RSV are negative.  UA is unremarkable. Seen by speech and swallow-mild aspiration risk.  Dysphagia 1 diet.   Nausea & vomiting: Moderate size hiatal hernia CT abdomen pelvis negative.  Started on PPI.  LFTs and lipase are normal   Elevated lactic acid level: Lactic acid 4.6, does not meet criteria for sepsis.  Partially due to dehydration. -Trend lactic acid  level -IV fluid as above    AKI (acute kidney injury) ([place]):  -Likely from dehydration.  Admission creatinine 1.67, slowly improving.  Refusing lab work this morning   Seizure ([place]) -Seizure precaution, on Keppra and Lamictal   Acute metabolic encephalopathy:  CT head, cervical spine are negative.  TSH, ammonia and B12 are normal.  Folate pending.   Hypertension:  Home lisinopril and metoprolol on hold.  IV as needed ordered. Slowly taken off due to enalapril.  Hypothyroidism -Synthroid   Hyperlipidemia -Zocor   CVA (cerebral vascular accident) ([place]) -Zocor    Depression -Continue home medications   Dementia without behavioral disturbance ([place]) -Fall precaution   Hx of DVT: -Elqiuis   Protein-calorie malnutrition, severe: Body weight 49.9 kg, BMI 20.12 -Ensure -Nutrition consult  Unfortunately this patient will remain at risk for recurrent aspiration and recurrent hospitalization.  She is also at risk of worsening malnourishment.  Dysphagia diet 1 is suggested but I am not sure if she will be able to have enough daily caloric intake.    DVT prophylaxis: Eliquis Code Status: DNR Family Communication: Son present bedside  Status is: Inpatient Cont hosp stay. On going eval for asp PNA Overall very weak.  Needs to work with PT OT    Subjective: Feels a little better today.  Son is present at bedside.  P.o. intake is still poor. I explained to the patient and the son that she is at risk of recurrent hospitalization due to recurrent aspiration.  Examination: Constitutional: Not in acute distress Respiratory: Clear to auscultation bilaterally Cardiovascular: Normal sinus rhythm, no rubs Abdomen: Nontender nondistended good bowel sounds Musculoskeletal: No edema noted Skin: No  rashes seen Neurologic: CN 2-12 grossly intact.  And nonfocal Psychiatric: Normal judgment and insight. Alert and oriented x 3. Normal mood.  Objective: Vitals:   05/14/22 2105  05/15/22 0013 05/15/22 0416 05/15/22 0843  BP:  (!) 124/48 135/75 (!) 160/76  Pulse:  66 66 71  Resp:  19 20 18   Temp:  98.4 F (36.9 C) 98.1 F (36.7 C) 97.8 F (36.6 C)  TempSrc:    Axillary  SpO2: 95% 96% 92% 99%  Weight:       No intake or output data in the 24 hours ending 05/15/22 0857  Filed Weights   05/13/22 1244  Weight: 49.9 kg     Data Reviewed:   CBC: Recent Labs  Lab 05/13/22 1348 05/14/22 0423  WBC 14.8* 7.6  HGB 12.7 10.3*  HCT 40.5 33.7*  MCV 93.3 95.5  PLT 237 [id number]   Basic Metabolic Panel: Recent Labs  Lab 05/13/22 1348 05/14/22 0423  NA 137 146*  K 4.4 4.0  CL 105 113*  CO2 21* 23  GLUCOSE 123* 81  BUN 26* 24*  CREATININE 1.67* 1.26*  CALCIUM 9.1 8.0*   GFR: Estimated Creatinine Clearance: 24.8 mL/min (A) (by C-G formula based on SCr of 1.26 mg/dL (H)). Liver Function Tests: Recent Labs  Lab 05/13/22 1348  AST 23  ALT 14  ALKPHOS 85  BILITOT 0.8  PROT 6.7  ALBUMIN 3.6   Recent Labs  Lab 05/14/22 1009  LIPASE 31   Recent Labs  Lab 05/14/22 1335  AMMONIA 16   Coagulation Profile: No results for input(s): "INR", "PROTIME" in the last 168 hours. Cardiac Enzymes: No results for input(s): "CKTOTAL", "CKMB", "CKMBINDEX", "TROPONINI" in the last 168 hours. BNP (last 3 results) No results for input(s): "PROBNP" in the last 8760 hours. HbA1C: No results for input(s): "HGBA1C" in the last 72 hours. CBG: Recent Labs  Lab 05/13/22 1944 05/14/22 1301 05/15/22 0156 05/15/22 0436 05/15/22 0846  GLUCAP 86 114* 111* 82 84   Lipid Profile: No results for input(s): "CHOL", "HDL", "LDLCALC", "TRIG", "CHOLHDL", "LDLDIRECT" in the last 72 hours. Thyroid Function Tests: Recent Labs    05/14/22 1009  TSH 1.665   Anemia Panel: Recent Labs    05/14/22 1009  VITAMINB12 322   Sepsis Labs: Recent Labs  Lab 05/13/22 1348 05/13/22 1752 05/14/22 1009  PROCALCITON  --   --  <0.10  LATICACIDVEN 4.6* 1.1  --     Recent  Results (from the past 240 hour(s))  Blood culture (routine x 2)     Status: None (Preliminary result)   Collection Time: 05/13/22  1:48 PM   Specimen: BLOOD  Result Value Ref Range Status   Specimen Description BLOOD RIGHT ANTECUBITAL  Final   Special Requests   Final    BOTTLES DRAWN AEROBIC AND ANAEROBIC Blood Culture adequate volume   Culture   Final    NO GROWTH 2 DAYS Performed at [facility], [address] [place], [place] [address]    Report Status PENDING  Incomplete  Blood culture (routine x 2)     Status: None (Preliminary result)   Collection Time: 05/13/22  1:48 PM   Specimen: BLOOD  Result Value Ref Range Status   Specimen Description BLOOD BLOOD RIGHT ARM  Final   Special Requests   Final    BOTTLES DRAWN AEROBIC AND ANAEROBIC Blood Culture results may not be optimal due to an inadequate volume of blood received in culture bottles   Culture  Final    NO GROWTH 2 DAYS Performed at [facility], [address] [place], [place] [address]    Report Status PENDING  Incomplete  Resp Panel by RT-PCR (Flu A&B, Covid) Anterior Nasal Swab     Status: None   Collection Time: 05/13/22  1:52 PM   Specimen: Anterior Nasal Swab  Result Value Ref Range Status   SARS Coronavirus 2 by RT PCR NEGATIVE NEGATIVE Final    Comment: (NOTE) SARS-CoV-2 target nucleic acids are NOT DETECTED.  The SARS-CoV-2 RNA is generally detectable in upper respiratory specimens during the acute phase of infection. The lowest concentration of SARS-CoV-2 viral copies this assay can detect is 138 copies/mL. A negative result does not preclude SARS-Cov-2 infection and should not be used as the sole basis for treatment or other patient management decisions. A negative result may occur with  improper specimen collection/handling, submission of specimen other than nasopharyngeal swab, presence of viral mutation(s) within the areas targeted by this assay, and inadequate number of  viral copies(<138 copies/mL). A negative result must be combined with clinical observations, patient history, and epidemiological information. The expected result is Negative.  Fact Sheet for Patients:  EntrepreneurPulse.com.au  Fact Sheet for Healthcare Providers:  IncredibleEmployment.be  This test is no t yet approved or cleared by the [place] FDA and  has been authorized for detection and/or diagnosis of SARS-CoV-2 by FDA under an Emergency Use Authorization (EUA). This EUA will remain  in effect (meaning this test can be used) for the duration of the COVID-19 declaration under Section 564(b)(1) of the Act, 21 U.S.C.section 360bbb-3(b)(1), unless the authorization is terminated  or revoked sooner.       Influenza A by PCR NEGATIVE NEGATIVE Final   Influenza B by PCR NEGATIVE NEGATIVE Final    Comment: (NOTE) The Xpert Xpress SARS-CoV-2/FLU/RSV plus assay is intended as an aid in the diagnosis of influenza from Nasopharyngeal swab specimens and should not be used as a sole basis for treatment. Nasal washings and aspirates are unacceptable for Xpert Xpress SARS-CoV-2/FLU/RSV testing.  Fact Sheet for Patients: EntrepreneurPulse.com.au  Fact Sheet for Healthcare Providers: IncredibleEmployment.be  This test is not yet approved or cleared by the [place] FDA and has been authorized for detection and/or diagnosis of SARS-CoV-2 by FDA under an Emergency Use Authorization (EUA). This EUA will remain in effect (meaning this test can be used) for the duration of the COVID-19 declaration under Section 564(b)(1) of the Act, 21 U.S.C. section 360bbb-3(b)(1), unless the authorization is terminated or revoked.  Performed at [facility], [address] [place], [place] [address]   MRSA Next Gen by PCR, Nasal     Status: Abnormal   Collection Time: 05/14/22 10:09 AM   Specimen: Nasal  Mucosa; Nasal Swab  Result Value Ref Range Status   MRSA by PCR Next Gen DETECTED (A) NOT DETECTED Final    Comment: RESULT CALLED TO, READ BACK BY AND VERIFIED WITH: BRANDY DAVIS RN [id number] 05/14/22 HNM (NOTE) The GeneXpert MRSA Assay (FDA approved for NASAL specimens only), is one component of a comprehensive MRSA colonization surveillance program. It is not intended to diagnose MRSA infection nor to guide or monitor treatment for MRSA infections. Test performance is not FDA approved in patients less than 73 years old. Performed at [facility], [address] [place], [place] [address]          Radiology Studies: CT ABDOMEN PELVIS WO CONTRAST  Result Date: 05/13/2022 CLINICAL DATA:  Vomiting, nonbilious (Ped 1-17y) EXAM: CT ABDOMEN AND PELVIS WITHOUT CONTRAST TECHNIQUE: Multidetector CT imaging of the abdomen and pelvis was performed following the standard protocol without IV contrast. RADIATION DOSE REDUCTION: This exam was performed according to the departmental dose-optimization program which includes automated exposure control, adjustment of the mA and/or kV according to patient size and/or use of iterative reconstruction technique. COMPARISON:  CT abdomen pelvis 03/22/2018 FINDINGS: Lower chest: No acute abnormality. Coronary artery calcification. Interval increase in size of a moderate volume hiatal hernia. Hepatobiliary: No focal liver abnormality. No gallstones, gallbladder wall thickening, or pericholecystic fluid. No biliary dilatation. Pancreas: No focal lesion. Normal pancreatic contour. No surrounding inflammatory changes. No main pancreatic ductal dilatation. Spleen: Normal in size.  Nonspecific vague hypodensity. Adrenals/Urinary Tract: No adrenal nodule bilaterally. Atrophic left kidney. Renal cortical scarring and thinning of the left kidney. No nephrolithiasis and no hydronephrosis. Bilateral fluid density lesions likely represent simple renal cysts. Simple renal  cysts, in the absence of clinically indicated signs/symptoms, require no independent follow-up. No ureterolithiasis or hydroureter. The urinary bladder is unremarkable. Stomach/Bowel: Stomach is within normal limits. No evidence of bowel wall thickening or dilatation. Colonic diverticulosis. The appendix is not definitely identified with no inflammatory changes in the right lower quadrant to suggest acute appendicitis. Vascular/Lymphatic: No abdominal aorta or iliac aneurysm. Severe atherosclerotic plaque of the aorta and its branches. No abdominal, pelvic, or inguinal lymphadenopathy. Reproductive: Status post hysterectomy. No adnexal masses. Other: No intraperitoneal free fluid. No intraperitoneal free gas. No organized fluid collection. Musculoskeletal: No abdominal wall hernia or abnormality. No suspicious lytic or blastic osseous lesions. No acute displaced fracture. Multilevel degenerative changes of the spine. Chronic appearing compression fracture of the T12 on L1 levels. Degenerative changes of the partially visualized left upper extremity. IMPRESSION: 1. Interval increase in size of a moderate volume hiatal hernia. 2. Atrophic left kidney. 3.  Aortic Atherosclerosis (ICD10-I70.0). Electronically Signed   By: Iven Finn M.D.   On: 05/13/2022 18:14   DG Chest Port 1 View  Result Date: 05/13/2022 CLINICAL DATA:  Altered mental status EXAM: PORTABLE CHEST 1 VIEW COMPARISON:  Chest radiograph 03/09/2022 FINDINGS: The cardiomediastinal silhouette is stable. There is no focal consolidation or pulmonary edema. There is no pleural effusion or pneumothorax There is no acute osseous abnormality. A prior fracture of the proximal left humerus and distal right clavicle are unchanged. IMPRESSION: Stable chest with no radiographic evidence of acute cardiopulmonary process. Electronically Signed   By: Valetta Mole M.D.   On: 05/13/2022 15:41   CT Cervical Spine Wo Contrast  Result Date: 05/13/2022 CLINICAL  DATA:  Trauma, altered mental status EXAM: CT CERVICAL SPINE WITHOUT CONTRAST TECHNIQUE: Multidetector CT imaging of the cervical spine was performed without intravenous contrast. Multiplanar CT image reconstructions were also generated. RADIATION DOSE REDUCTION: This exam was performed according to the departmental dose-optimization program which includes automated exposure control, adjustment of the mA and/or kV according to patient size and/or use of iterative reconstruction technique. COMPARISON:  05/08/2021 FINDINGS: Alignment: Alignment of posterior margins of vertebral bodies is within normal limits. Skull base and vertebrae: No recent fracture is seen. There are smooth marginated calcifications posterior to the spinous processes of C4, C5 and C7 vertebrae with no interval change. Soft tissues and spinal canal: There is extrinsic pressure over the ventral margin of thecal sac caused by posterior bony spurs at C4-C5 and C5-C6 levels. Disc levels: There is encroachment of neural foramina by bony spurs from C3 to C7 levels. Upper chest:  There is ectasia of the aortic arch measuring 4 cm in maximum diameter. Extensive calcifications are seen in thoracic aorta and its major branches. Small amount of debris is seen in the dependent portion of the lumen of trachea. Other: Thyroid is smaller than usual with inhomogeneous attenuation. IMPRESSION: No recent fracture is seen in cervical spine. Cervical spondylosis with encroachment of neural foramina from C3-C7 levels. There is debris in the dependent portion of lumen of trachea suggesting possible aspiration. 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 Electronically Signed   By: Elmer Picker M.D.   On: 05/13/2022 14:51   CT Head Wo Contrast  Result Date: 05/13/2022 CLINICAL DATA:  Altered mental status EXAM: CT HEAD WITHOUT CONTRAST TECHNIQUE: Contiguous axial images were obtained from the base of the skull through the  vertex without intravenous contrast. RADIATION DOSE REDUCTION: This exam was performed according to the departmental dose-optimization program which includes automated exposure control, adjustment of the mA and/or kV according to patient size and/or use of iterative reconstruction technique. COMPARISON:  03/08/2022 FINDINGS: Brain: No acute intracranial findings are seen. There are no signs of bleeding within the cranium. There is large old infarct in right MCA distribution in right parietal lobe. There is also encephalomalacia in the left cerebellum suggesting old infarct. Minimal calcifications are seen in basal ganglia. No significant interval changes are noted. Vascular: Scattered arterial calcifications are seen. Skull: No acute findings are seen. Sinuses/Orbits: Unremarkable. Other: No significant interval changes are noted. IMPRESSION: No acute intracranial findings are seen. Old infarcts are seen in right cerebral and left cerebellar hemispheres. Atrophy. No significant interval changes are noted. Electronically Signed   By: Elmer Picker M.D.   On: 05/13/2022 14:44        Scheduled Meds:  apixaban  2.5 mg Oral BID   cholecalciferol  1,000 Units Oral Daily   citalopram  20 mg Oral Daily   docusate sodium  100 mg Oral BID   feeding supplement  237 mL Oral BID BM   ipratropium-albuterol  3 mL Nebulization BID   lamoTRIgine  100 mg Oral Q12H   levothyroxine  25 mcg Oral QAC breakfast   melatonin  5 mg Oral QHS   midodrine  10 mg Oral TID WC   multivitamin with minerals  1 tablet Oral Daily   pantoprazole  40 mg Oral QAC breakfast   QUEtiapine  25 mg Oral QHS   simvastatin  40 mg Oral q1800   Continuous Infusions:  ampicillin-sulbactam (UNASYN) IV 3 g (05/15/22 0336)   dextrose 5 % and 0.45% NaCl 75 mL/hr at 05/14/22 2202   levETIRAcetam 750 mg (05/14/22 2203)     LOS: 2 days   Time spent= 35 mins    Arrion Broaddus Arsenio Loader, MD [facility]  If 7PM-7AM, please contact  night-coverage  05/15/2022, 8:57 AM

## 2022-05-15 NOTE — Plan of Care (Signed)
GOC consult noted.  Notes and labs reviewed.  In to see patient.  Patient immediately tells me that she would like to see [name] and Justin's wife Laray Anger.  She is confused and states that she lives at home with her husband and is unable to tell me about living at [facility].  No family at bedside.  Stepped out to call family.  Chancy Hurter is listed as primary contact and demographics.  Attempted to reach [place] unsuccessfully.  Will reattempt tomorrow.

## 2022-05-15 NOTE — Progress Notes (Signed)
Initial Nutrition Assessment  DOCUMENTATION CODES:   Non-severe (moderate) malnutrition in context of chronic illness  INTERVENTION:   -Magic cup TID with meals, each supplement provides 290 kcal and 9 grams of protein  -MVI with minerals daily -Feeding assistance with meals -Ensure Enlive po BID, each supplement provides 350 kcal and 20 grams of protein.   NUTRITION DIAGNOSIS:   Moderate Malnutrition related to chronic illness (dementia) as evidenced by mild fat depletion, mild muscle depletion, moderate muscle depletion.  GOAL:   Patient will meet greater than or equal to 90% of their needs  MONITOR:   PO intake, Supplement acceptance, Diet advancement  REASON FOR ASSESSMENT:   Consult Assessment of nutrition requirement/status  ASSESSMENT:   Pt with medical history significant of stroke with left-sided weakness, hypertension, hyperlipidemia, CAD, hypothyroidism, dementia, depression, esophageal stricture, PE on Eliquis, remote breast cancer, seizure, who presents with worsening mental status, dementia and vomiting.  Pt admitted with aspiration pneumonia.   3/27- s/p BSE- advanced to dysphagia 1 diet with thin liquids  Reviewed I/O's: +2.3 L x 24 hours  Pt lying in bed at time of visit. Pt did not arouse to voice or touch. No family present to provide additional history.  Observed lunch tray- pt consumed a few bites of pudding, 25% of Magic Cup, and 1.3 of carrots, mashed potatoes, and beef. Noted meal completions 0-50%.    Reviewed wt hx; pt has experienced a 1.8% wt loss over the past 3 months, which is not significant for time frame.   RD would not recommend alternative means of nutrition and hydration, as this would not enhance pt's quality of life or decrease aspiration risk.   Palliative care consult pending for goals of care discussions.   Medications reviewed and include vitamin D3, colace, melatonin, unasyn, and keppra.   Labs reviewed: Na: 146, CBGS:  82-111.   NUTRITION - FOCUSED PHYSICAL EXAM:  Flowsheet Row Most Recent Value  Orbital Region Mild depletion  Upper Arm Region Mild depletion  Thoracic and Lumbar Region No depletion  Buccal Region No depletion  Temple Region Mild depletion  Clavicle Bone Region Mild depletion  Clavicle and Acromion Bone Region No depletion  Scapular Bone Region No depletion  Dorsal Hand Moderate depletion  Patellar Region Moderate depletion  Anterior Thigh Region Moderate depletion  Posterior Calf Region Moderate depletion  Edema (RD Assessment) None  Hair Reviewed  Eyes Reviewed  Mouth Reviewed  Skin Reviewed  Nails Reviewed       Diet Order:   Diet Order             DIET - DYS 1 Room service appropriate? No; Fluid consistency: Thin  Diet effective now                   EDUCATION NEEDS:   No education needs have been identified at this time  Skin:  Skin Assessment: Reviewed RN Assessment  Last BM:  05/14/22  Height:   Ht Readings from Last 1 Encounters:  03/09/22 5\' 2"  (1.575 m)    Weight:   Wt Readings from Last 1 Encounters:  05/13/22 49.9 kg    Ideal Body Weight:  50 kg  BMI:  Body mass index is 20.12 kg/m.  Estimated Nutritional Needs:   Kcal:  1500-1700  Protein:  75-90 grams  Fluid:  > 1.5 L    Loistine Chance, RD, LDN, [place] Registered Dietitian II Certified Diabetes Care and Education Specialist Please refer to [facility] for RD and/or RD  on-call/weekend/after hours pager

## 2022-05-15 NOTE — Evaluation (Signed)
Physical Therapy Evaluation Patient Details Name: Sonya [name] MRN: [id number] DOB: 06/26/1932 Today's Date: 05/15/2022  History of Present Illness  89 y.o. female with medical history significant of stroke with left-sided weakness, hypertension, hyperlipidemia, CAD, hypothyroidism, dementia, depression, esophageal stricture, PE on Eliquis, remote breast cancer, seizure, who presents with worsening mental status, dementia and vomiting.    Clinical Impression  Pt received in Semi-Fowler's position and somewhat agreeable to therapy.  Family in room and pt perseverating on them being in room and consistently asking where they are during therapy session.  Pt has minimal musculature contraction on the R LE and requires maxA to come to seated position at EOB.  Once in position, pt unable to remain seated without support from therapist to prevent posterior lean.  Pt is able to perform bed level exercises with AROM of the L LE, and AAROM on the R LE.  Pt then transferred back to bed and repositioned with family member assistance.  Pt left with all needs met and call bell within reach.  Family discussed pt going back to [employer] although pt states she does not want to return.        Recommendations for follow up therapy are one component of a multi-disciplinary discharge planning process, led by the attending physician.  Recommendations may be updated based on patient status, additional functional criteria and insurance authorization.  Follow Up Recommendations Can patient physically be transported by private vehicle: No     Assistance Recommended at Discharge Frequent or constant Supervision/Assistance  Patient can return home with the following  Two people to help with walking and/or transfers;Two people to help with bathing/dressing/bathroom;Help with stairs or ramp for entrance;Assist for transportation;Assistance with feeding;Assistance with cooking/housework;Direct supervision/assist for  financial management;Direct supervision/assist for medications management    Equipment Recommendations None recommended by PT  Recommendations for Other Services       Functional Status Assessment Patient has had a recent decline in their functional status and/or demonstrates limited ability to make significant improvements in function in a reasonable and predictable amount of time     Precautions / Restrictions Precautions Precautions: None      Mobility  Bed Mobility Overal bed mobility: Needs Assistance Bed Mobility: Supine to Sit, Sit to Supine     Supine to sit: Max assist Sit to supine: Max assist   General bed mobility comments: Pt unable to perform bed mobility without maxA from therapist.    Transfers                   General transfer comment: deferred due to inability to perform bed mobility.    Ambulation/Gait                  Stairs            Wheelchair Mobility    Modified Rankin (Stroke Patients Only)       Balance Overall balance assessment: Needs assistance Sitting-balance support: Feet supported, No upper extremity supported Sitting balance-Leahy Scale: Zero Sitting balance - Comments: Posterior lean requires maxA from therapist to prevent pt lying down. Postural control: Posterior lean                                   Pertinent Vitals/Pain Pain Assessment Pain Assessment: Faces Faces Pain Scale: Hurts little more Pain Location: L foot Pain Descriptors / Indicators: Grimacing Pain Intervention(s): Limited activity within patient's tolerance, Monitored  during session, Repositioned    Home Living Family/patient expects to be discharged to:: Skilled nursing facility                        Prior Function               Mobility Comments: Pt was at [facility] prior to coming to hospital.       Hand Dominance   Dominant Hand: Right    Extremity/Trunk Assessment   Upper  Extremity Assessment Upper Extremity Assessment: RUE deficits/detail;Generalized weakness RUE Deficits / Details: Decreased functional use of the R UE    Lower Extremity Assessment Lower Extremity Assessment: RLE deficits/detail;Generalized weakness RLE Deficits / Details: Decreased functional use of the R LE       Communication   Communication: HOH  Cognition Arousal/Alertness: Awake/alert Behavior During Therapy: WFL for tasks assessed/performed Overall Cognitive Status: Within Functional Limits for tasks assessed                                          General Comments      Exercises Total Joint Exercises Ankle Circles/Pumps: AROM, AAROM, Strengthening, Both, 10 reps, Supine Long Arc Quad: AROM, AAROM, Strengthening, Both, 10 reps, Seated Marching in Standing: AROM, AAROM, Strengthening, Both, 10 reps, Seated   Assessment/Plan    PT Assessment Patient needs continued PT services  PT Problem List Decreased strength;Decreased range of motion;Decreased activity tolerance;Decreased balance;Decreased mobility;Decreased cognition;Decreased safety awareness       PT Treatment Interventions Functional mobility training;Therapeutic activities;Therapeutic exercise;Balance training;Neuromuscular re-education    PT Goals (Current goals can be found in the Care Plan section)  Acute Rehab PT Goals Patient Stated Goal: to go back to [facility] PT Goal Formulation: With family Time For Goal Achievement: 05/29/22 Potential to Achieve Goals: Fair    Frequency Min 2X/week     Co-evaluation               AM-PAC PT "6 Clicks" Mobility  Outcome Measure Help needed turning from your back to your side while in a flat bed without using bedrails?: A Lot Help needed moving from lying on your back to sitting on the side of a flat bed without using bedrails?: Total Help needed moving to and from a bed to a chair (including a wheelchair)?: Total Help needed  standing up from a chair using your arms (e.g., wheelchair or bedside chair)?: Total Help needed to walk in hospital room?: Total Help needed climbing 3-5 steps with a railing? : Total 6 Click Score: 7    End of Session   Activity Tolerance: Patient limited by fatigue Patient left: in bed;with call bell/phone within reach;with bed alarm set;with family/visitor present Nurse Communication: Mobility status PT Visit Diagnosis: Muscle weakness (generalized) (M62.81);Adult, failure to thrive (R62.7)    Time: [id number] PT Time Calculation (min) (ACUTE ONLY): 35 min   Charges:   PT Evaluation $PT Eval Low Complexity: 1 Low PT Treatments $Therapeutic Activity: 8-22 mins        Gwenlyn Saran, PT, DPT Physical Therapist - [facility]  05/15/22, 2:51 PM

## 2022-05-15 NOTE — Evaluation (Signed)
Occupational Therapy Evaluation Patient Details Name: Sonya [name] MRN: [id number] DOB: 10-Sep-1932 Today's Date: 05/15/2022   History of Present Illness 89 y.o. female with medical history significant of stroke with left-sided weakness, hypertension, hyperlipidemia, CAD, hypothyroidism, dementia, depression, esophageal stricture, PE on Eliquis, remote breast cancer, seizure, who presents with worsening mental status, dementia and vomiting.   Clinical Impression   Sonya [name] was seen for OT evaluation this date. Pt presents with history of cognitive impairments at baseline. Per chart, pt was residing in a SNF PTA where she required significant assistance for ADL management and functional mobility. Pt presents to acute OT demonstrating impaired ADL performance and functional mobility 2/2 decreased cognition, significant LUE/LLE weakness as well as generalized R sided weakness, decreased activity tolerance, and decreased balance (See OT problem list for additional deficits). Pt currently requires TOTAL A for self-feeding and grooming at bed level, +2 MAX-TOTAL A for bed mobility.  Pt would benefit from skilled OT services on a trial basis to address noted impairments and functional limitations (see below for any additional details) in order to maximize safety and independence while minimizing falls risk and caregiver burden. Anticipate pt will require <3 hours/day of follow up therapy upon acute hospital DC.      Recommendations for follow up therapy are one component of a multi-disciplinary discharge planning process, led by the attending physician.  Recommendations may be updated based on patient status, additional functional criteria and insurance authorization.   Assistance Recommended at Discharge Frequent or constant Supervision/Assistance  Patient can return home with the following Two people to help with walking and/or transfers;Two people to help with bathing/dressing/bathroom;Assist for  transportation    Functional Status Assessment  Patient has had a recent decline in their functional status and/or demonstrates limited ability to make significant improvements in function in a reasonable and predictable amount of time  Equipment Recommendations  None recommended by OT    Recommendations for Other Services       Precautions / Restrictions Precautions Precautions: Fall Restrictions Weight Bearing Restrictions: No      Mobility Bed Mobility Overal bed mobility: Needs Assistance Bed Mobility: Rolling Rolling: Total assist, Max assist         General bed mobility comments: Requires TOTAL assist to reposition shoulders/hips in bed. yells out with attempts to move LLE/LUE.    Transfers                   General transfer comment: deferred due to inability to perform bed mobility.      Balance Overall balance assessment: Needs assistance                                         ADL either performed or assessed with clinical judgement   ADL Overall ADL's : Needs assistance/impaired                                       General ADL Comments: Pt is significantly functionally limtied by L sided weakness, L inattention, decreased activity tolerance, decreased cognition, and decreased functional use of her RUE/LUE. She requires MAX progressing to TOTAL assist for bed-level feeding and grooming. Anticipate +2 MAX A for bed mobility/transfers.     Vision         Perception     Praxis  Pertinent Vitals/Pain Pain Assessment Pain Assessment: No/denies pain Faces Pain Scale: No hurt     Hand Dominance Right   Extremity/Trunk Assessment Upper Extremity Assessment Upper Extremity Assessment: Generalized weakness;LUE deficits/detail;RUE deficits/detail RUE Deficits / Details: decreased shoulder flexion and grip strength. Pt is able to bring RUE to mouth to hold spoon/cup. She requires assist to position RUE in  line of site and cueing to actively use her hand during functional activity. RUE Coordination: decreased fine motor;decreased gross motor LUE Deficits / Details: LUE remains generally flaccid at pts side. She is noted with decreased attention to her left side and keeps her head turned to the right t/o much of session. No active functional use of LUE appreciated. Pt yells out "help" with attempts at PROM of her LUE. Assessment limited, Increased flexor tone appreciated at elbow.   Lower Extremity Assessment Lower Extremity Assessment: Defer to PT evaluation;Difficult to assess due to impaired cognition RLE Deficits / Details: Decreased functional use of the R LE       Communication Communication Communication: HOH   Cognition Arousal/Alertness: Awake/alert Behavior During Therapy: Restless Overall Cognitive Status: No family/caregiver present to determine baseline cognitive functioning                                 General Comments: History of cognitive impairements at baseline. No family present to determine present cognitive level. Pt is oriented to self and limited place. She repeatedly asks for family members who are not present in the room and calls out for help repeatedly, but is unable to state what she needs when asked. Follows VCs inconsistently but demos improved success with increased processing time and multimodal cueing.     General Comments       Exercises Other Exercises Other Exercises: Pt education limited 2/2 cognition. OT facilitated self-feeding with education on safe positiong and use of pt RUE/LUE. No family at bedside, will continue to benefit from ongoing pt/caregiver education on compensatory ADL management strategies.   Shoulder Instructions      Home Living Family/patient expects to be discharged to:: Skilled nursing facility                                        Prior Functioning/Environment Prior Level of Function : Needs  assist       Physical Assist : Mobility (physical);ADLs (physical) Mobility (physical): Bed mobility;Transfers ADLs (physical): Grooming;Feeding;Bathing;Dressing;Toileting;IADLs Mobility Comments: Pt was at [facility] prior to coming to hospital. ADLs Comments: Per chart pt required significant assist for all ADL/IADL management from SNF staff.        OT Problem List: Decreased strength;Decreased coordination;Decreased range of motion;Decreased activity tolerance;Decreased safety awareness;Impaired balance (sitting and/or standing);Decreased knowledge of use of DME or AE;Decreased cognition;Impaired tone;Impaired UE functional use      OT Treatment/Interventions: Self-care/ADL training;Therapeutic exercise;Therapeutic activities;DME and/or AE instruction;Patient/family education;Balance training;Neuromuscular education;Cognitive remediation/compensation;Energy conservation    OT Goals(Current goals can be found in the care plan section) Acute Rehab OT Goals Patient Stated Goal: To feel better OT Goal Formulation: With patient Time For Goal Achievement: 05/29/22 Potential to Achieve Goals: Good ADL Goals Pt Will Perform Eating: sitting;with mod assist;with adaptive utensils Pt Will Perform Grooming: sitting;with mod assist;with adaptive equipment Pt Will Transfer to Toilet: bedside commode;squat pivot transfer;with +2 assist;with mod assist;with max assist Pt Will Perform Toileting -  Clothing Manipulation and hygiene: sitting/lateral leans;with 2+ total assist;with mod assist  OT Frequency: Min 1X/week    Co-evaluation              AM-PAC OT "6 Clicks" Daily Activity     Outcome Measure Help from another person eating meals?: A Lot Help from another person taking care of personal grooming?: A Lot Help from another person toileting, which includes using toliet, bedpan, or urinal?: Total Help from another person bathing (including washing, rinsing, drying)?: Total Help  from another person to put on and taking off regular upper body clothing?: A Lot Help from another person to put on and taking off regular lower body clothing?: Total 6 Click Score: 9   End of Session    Activity Tolerance: Other (comment) (cognition) Patient left: in bed;with call bell/phone within reach;with bed alarm set  OT Visit Diagnosis: Other abnormalities of gait and mobility (R26.89);Muscle weakness (generalized) (M62.81);Hemiplegia and hemiparesis Hemiplegia - Right/Left: Left (Remote) Hemiplegia - dominant/non-dominant: Non-Dominant Hemiplegia - caused by: Cerebral infarction (Remote)                Time: [id number] OT Time Calculation (min): 28 min Charges:  OT General Charges $OT Visit: 1 Visit OT Evaluation $OT Eval Moderate Complexity: 1 Mod OT Treatments $Self Care/Home Management : 8-22 mins  Shara Blazing, M.S., OTR/L 05/15/22, 3:19 PM

## 2022-05-16 DIAGNOSIS — J690 Pneumonitis due to inhalation of food and vomit: Secondary | ICD-10-CM | POA: Diagnosis not present

## 2022-05-16 LAB — BASIC METABOLIC PANEL
Anion gap: 8 (ref 5–15)
BUN: 14 mg/dL (ref 8–23)
CO2: 22 mmol/L (ref 22–32)
Calcium: 8.1 mg/dL — ABNORMAL LOW (ref 8.9–10.3)
Chloride: 108 mmol/L (ref 98–111)
Creatinine, Ser: 0.79 mg/dL (ref 0.44–1.00)
GFR, Estimated: >60 mL/min (ref >60)
Glucose, Bld: 90 mg/dL (ref 70–99)
Potassium: 3.3 mmol/L — ABNORMAL LOW (ref 3.5–5.1)
Sodium: 138 mmol/L (ref 135–145)

## 2022-05-16 LAB — CBC
HCT: 34.6 % — ABNORMAL LOW (ref 36.0–46.0)
Hemoglobin: 11.3 g/dL — ABNORMAL LOW (ref 12.0–15.0)
MCH: 29.4 pg (ref 26.0–34.0)
MCHC: 32.7 g/dL (ref 30.0–36.0)
MCV: 89.9 fL (ref 80.0–100.0)
Platelets: 213 10*3/uL (ref 150–400)
RBC: 3.85 MIL/uL — ABNORMAL LOW (ref 3.87–5.11)
RDW: 12.3 % (ref 11.5–15.5)
WBC: 9.2 10*3/uL (ref 4.0–10.5)
nRBC: 0 % (ref 0.0–0.2)

## 2022-05-16 LAB — GLUCOSE, CAPILLARY
Glucose-Capillary: 90 mg/dL (ref 70–99)
Glucose-Capillary: 84 mg/dL (ref 70–99)

## 2022-05-16 LAB — MAGNESIUM: Magnesium: 1.6 mg/dL — ABNORMAL LOW (ref 1.7–2.4)

## 2022-05-16 MED ORDER — METOPROLOL TARTRATE 50 MG PO TABS
50.0000 mg | ORAL_TABLET | Freq: Two times a day (BID) | ORAL | Status: DC
  Administered 2022-05-16: 50 mg via ORAL
  Filled 2022-05-16: qty 1

## 2022-05-16 MED ORDER — POTASSIUM CHLORIDE CRYS ER 20 MEQ PO TBCR
40.0000 meq | EXTENDED_RELEASE_TABLET | Freq: Once | ORAL | Status: AC
  Administered 2022-05-16: 40 meq via ORAL
  Filled 2022-05-16: qty 2

## 2022-05-16 MED ORDER — MAGNESIUM SULFATE 4 GM/100ML IV SOLN
4.0000 g | Freq: Once | INTRAVENOUS | Status: AC
  Administered 2022-05-16: 4 g via INTRAVENOUS
  Filled 2022-05-16: qty 100

## 2022-05-16 MED ORDER — AMOXICILLIN-POT CLAVULANATE 875-125 MG PO TABS: 1.0000 | ORAL_TABLET | Freq: Two times a day (BID) | ORAL | 0 refills | Status: AC

## 2022-05-16 MED ORDER — FERROUS SULFATE 325 (65 FE) MG PO TABS: 325.0000 mg | ORAL_TABLET | Freq: Two times a day (BID) | ORAL | Status: DC

## 2022-05-16 MED ORDER — POTASSIUM CHLORIDE 10 MEQ/100ML IV SOLN
10.0000 meq | INTRAVENOUS | Status: AC
  Administered 2022-05-16 (×2): 10 meq via INTRAVENOUS
  Filled 2022-05-16 (×2): qty 100

## 2022-05-16 MED ORDER — LEVETIRACETAM 750 MG PO TABS
750.0000 mg | ORAL_TABLET | Freq: Two times a day (BID) | ORAL | Status: DC
  Administered 2022-05-16: 750 mg via ORAL
  Filled 2022-05-16: qty 1

## 2022-05-16 MED ORDER — LISINOPRIL 10 MG PO TABS
10.0000 mg | ORAL_TABLET | Freq: Every day | ORAL | Status: DC
  Administered 2022-05-16: 10 mg via ORAL
  Filled 2022-05-16: qty 1

## 2022-05-16 NOTE — Plan of Care (Signed)
GOC consult in place. Patient is unable to participate in [place] conversation. She denies complaint at this time, and no family at bedside. Per current notes, patient will be discharged today to [facility]. PMT will sign off. Please reconsult if needs arise.

## 2022-05-16 NOTE — Discharge Summary (Signed)
Physician Discharge Summary  Sonya [name] [id number] DOB: October 02, 1932 DOA: 05/13/2022  PCP: Baxter Hire, MD  Admit date: 05/13/2022 Discharge date: 05/16/2022  Admitted From: ALF Disposition:  ALF  Recommendations for Outpatient Follow-up:  Follow up with PCP in 1-2 weeks Please obtain BMP/CBC in one week your next doctors visit.  5 more days of oral Augmentin Should be on dysphagia 1 diet Keppra 1000 mg twice daily.  If GFR falls below 50 then the dose should be adjusted   Discharge Condition: Stable CODE STATUS: DNR Diet recommendation: Dysphagia 1 diet  Brief/Interim Summary: 89 y.o. female with medical history significant of stroke with left-sided weakness, hypertension, hyperlipidemia, CAD, hypothyroidism, dementia, depression, esophageal stricture, PE on Eliquis, remote breast cancer, seizure, who presents with worsening mental status, dementia and vomiting.  Upon admission patient was found to have acute kidney injury.  CT head, chest x-ray, CT cervical spine were negative.  There was concerns of aspiration therefore started on Unasyn.  Patient was seen by speech and swallow therapy who recommended dysphagia 1 diet.  PPI was added.  Due to recurrent issues with aspiration, advanced dementia and her comorbidities palliative care team was consulted. Patient did well throughout the hospital stay on Unasyn and her breathing improved.  Son was present also at bedside and I also met him again on the day of discharge.  No complaints overall doing well.  Will discharge her to ALF with recommendations as stated above     Assessment & Plan:  Principal Problem:   Aspiration pneumonia ([place]) Active Problems:   Nausea & vomiting   Elevated lactic acid level   Seizure ([place])   Acute metabolic encephalopathy   Hypertension   Hypothyroidism   Hyperlipidemia   CVA (cerebral vascular accident) ([place])   AKI (acute kidney injury) ([place])   Depression   Dementia without behavioral  disturbance ([place])   DVT (deep venous thrombosis) ([facility])   Protein-calorie malnutrition, severe      Severe sepsis secondary to aspiration pneumonia ([place]) -Sepsis physiology resolved g.  Obvious concerns of aspiration at this time.  Bronchodilators, aspiration precaution.  On Unasyn> transition to oral Augmentin for 5 more days.  Procalcitonin negative, BNP slightly elevated but clinical signs of dehydration.  Flu, COVID/RSV are negative.  UA is unremarkable. Seen by speech and swallow-mild aspiration risk.  Dysphagia 1 diet.   Nausea & vomiting: Moderate size hiatal hernia CT abdomen pelvis negative.  Started on PPI.  LFTs and lipase are normal   Elevated lactic acid level: Resolved     AKI (acute kidney injury) ([place]): Resolved -Likely from dehydration.  Admission creatinine 1.67, slowly improving.  Creatinine on discharge 0.79   Seizure ([facility]) -Seizure precaution, on Keppra and Lamictal Dose to be adjusted if GFR falls below 50   Acute metabolic encephalopathy:  CT head, cervical spine are negative.  TSH, ammonia and B12 are normal.    Hypertension:  Resume home regimen   Hypothyroidism -Synthroid   Hyperlipidemia -Zocor   CVA (cerebral vascular accident) ([place]) -Zocor     Depression -Continue home medications   Dementia without behavioral disturbance ([place]) -Fall precaution   Hx of DVT: -Elqiuis   Protein-calorie malnutrition, severe: Body weight 49.9 kg, BMI 20.12 -Ensure -Nutrition consult   Unfortunately this patient will remain at risk for recurrent aspiration and recurrent hospitalization.  She is also at risk of worsening malnourishment.  Dysphagia diet 1 is suggested but I am not sure if she will be able to have enough daily  caloric intake. Son is aware of this risk    Consultations: Speech and swallow  Subjective: No complaints doing well.  Discharge Exam: Vitals:   05/16/22 0830 05/16/22 1143  BP: (!) 196/74 (!) 127/56  Pulse: 80 67  Resp:  18 20  Temp: 98.9 F (37.2 C) 98.7 F (37.1 C)  SpO2: 97% 96%   Vitals:   05/16/22 0333 05/16/22 0548 05/16/22 0830 05/16/22 1143  BP: (!) 190/62 (!) 167/63 (!) 196/74 (!) 127/56  Pulse: 81  80 67  Resp: 20  18 20   Temp: 98.2 F (36.8 C)  98.9 F (37.2 C) 98.7 F (37.1 C)  TempSrc: Oral  Oral   SpO2: 98%  97% 96%  Weight:        General: Pt is alert, awake, not in acute distress Cardiovascular: RRR, S1/S2 +, no rubs, no gallops Respiratory: CTA bilaterally, no wheezing, no rhonchi Abdominal: Soft, NT, ND, bowel sounds + Extremities: no edema, no cyanosis  Discharge Instructions   Allergies as of 05/16/2022       Reactions   Clopidogrel Nausea And Vomiting    reported by [facility] 11/24/13   Omeprazole Other (See Comments)   Unknown reaction - reported by [place] 11/24/13        Medication List     TAKE these medications    acetaminophen 325 MG tablet Commonly known as: TYLENOL Take 1 tablet (325 mg total) by mouth every 4 (four) hours as needed for mild pain (or temp >/= 101 F).   amoxicillin-clavulanate 875-125 MG tablet Commonly known as: AUGMENTIN Take 1 tablet by mouth 2 (two) times daily for 5 days.   apixaban 2.5 MG Tabs tablet Commonly known as: ELIQUIS Take 1 tablet (2.5 mg total) by mouth 2 (two) times daily.   citalopram 20 MG tablet Commonly known as: CELEXA Take 20 mg by mouth daily.   Dermacloud Oint Apply 1 application  topically 3 (three) times daily.   docusate sodium 100 MG capsule Commonly known as: COLACE Take 100 mg by mouth 2 (two) times daily.   feeding supplement Liqd Take 237 mLs by mouth 2 (two) times daily between meals.   ferrous sulfate 325 (65 FE) MG tablet Take 1 tablet (325 mg total) by mouth 2 (two) times daily with a meal.   gabapentin 100 MG capsule Commonly known as: NEURONTIN Take 200 mg by mouth 3 (three) times daily.   lamoTRIgine 100 MG tablet Commonly known as:  LAMICTAL Take 100 mg by mouth every 12 (twelve) hours.   levETIRAcetam 100 MG/ML solution Commonly known as: KEPPRA Take 7.5-10 mLs by mouth every 12 (twelve) hours. 7.5 ml by mouth every 12 hours. Change to 1000 mg BID when creatinine clearance is >48ml/min   levETIRAcetam 750 MG tablet Commonly known as: KEPPRA Take 1 tablet (750 mg total) by mouth 2 (two) times daily. Change to 1000 mg po BID when creatinine clearance is >50 ml/min   levothyroxine 25 MCG tablet Commonly known as: SYNTHROID Take 25 mcg by mouth daily before breakfast.   lisinopril 10 MG tablet Commonly known as: ZESTRIL Take 1 tablet (10 mg total) by mouth daily.   Melatonin 5 MG Caps Take 5 mg by mouth at bedtime.   metoprolol tartrate 50 MG tablet Commonly known as: LOPRESSOR Take 1 tablet (50 mg total) by mouth 2 (two) times daily.   multivitamin with minerals Tabs tablet Take 1 tablet by mouth daily.   ondansetron 4 MG  disintegrating tablet Commonly known as: ZOFRAN-ODT Take 1 tablet (4 mg total) by mouth every 8 (eight) hours as needed for nausea or vomiting.   QUEtiapine 50 MG tablet Commonly known as: SEROQUEL Take 20 mg by mouth at bedtime.   simvastatin 40 MG tablet Commonly known as: ZOCOR Take 1 tablet (40 mg total) by mouth daily at 6 PM.   Vitamin D3 25 MCG (1000 UT) capsule Generic drug: Cholecalciferol Take 1,000 Units by mouth 2 (two) times a day.        Follow-up Information     Baxter Hire, MD Follow up in 1 week(s).   Specialty: Internal Medicine Contact information: [place] [address] [phone]                Allergies  Allergen Reactions   Clopidogrel Nausea And Vomiting     reported by [facility] 11/24/13   Omeprazole Other (See Comments)    Unknown reaction - reported by [facility] [place] 11/24/13    You were cared for by a hospitalist during your hospital stay. If you have any questions  about your discharge medications or the care you received while you were in the hospital after you are discharged, you can call the unit and asked to speak with the hospitalist on call if the hospitalist that took care of you is not available. Once you are discharged, your primary care physician will handle any further medical issues. Please note that no refills for any discharge medications will be authorized once you are discharged, as it is imperative that you return to your primary care physician (or establish a relationship with a primary care physician if you do not have one) for your aftercare needs so that they can reassess your need for medications and monitor your lab values.  You were cared for by a hospitalist during your hospital stay. If you have any questions about your discharge medications or the care you received while you were in the hospital after you are discharged, you can call the unit and asked to speak with the hospitalist on call if the hospitalist that took care of you is not available. Once you are discharged, your primary care physician will handle any further medical issues. Please note that NO REFILLS for any discharge medications will be authorized once you are discharged, as it is imperative that you return to your primary care physician (or establish a relationship with a primary care physician if you do not have one) for your aftercare needs so that they can reassess your need for medications and monitor your lab values.  Please request your Prim.MD to go over all Hospital Tests and Procedure/Radiological results at the follow up, please get all Hospital records sent to your Prim MD by signing hospital release before you go home.  Get CBC, CMP, 2 view Chest X ray checked  by Primary MD during your next visit or SNF MD in 5-7 days ( we routinely change or add medications that can affect your baseline labs and fluid status, therefore we recommend that you get the mentioned  basic workup next visit with your PCP, your PCP may decide not to get them or add new tests based on their clinical decision)  On your next visit with your primary care physician please Get Medicines reviewed and adjusted.  If you experience worsening of your admission symptoms, develop shortness of breath, life threatening emergency, suicidal or homicidal thoughts you must seek  medical attention immediately by calling 911 or calling your MD immediately  if symptoms less severe.  You Must read complete instructions/literature along with all the possible adverse reactions/side effects for all the Medicines you take and that have been prescribed to you. Take any new Medicines after you have completely understood and accpet all the possible adverse reactions/side effects.   Do not drive, operate heavy machinery, perform activities at heights, swimming or participation in water activities or provide baby sitting services if your were admitted for syncope or siezures until you have seen by Primary MD or a Neurologist and advised to do so again.  Do not drive when taking Pain medications.   Procedures/Studies: CT ABDOMEN PELVIS WO CONTRAST  Result Date: 05/13/2022 CLINICAL DATA:  Vomiting, nonbilious (Ped 1-17y) EXAM: CT ABDOMEN AND PELVIS WITHOUT CONTRAST TECHNIQUE: Multidetector CT imaging of the abdomen and pelvis was performed following the standard protocol without IV contrast. RADIATION DOSE REDUCTION: This exam was performed according to the departmental dose-optimization program which includes automated exposure control, adjustment of the mA and/or kV according to patient size and/or use of iterative reconstruction technique. COMPARISON:  CT abdomen pelvis 03/22/2018 FINDINGS: Lower chest: No acute abnormality. Coronary artery calcification. Interval increase in size of a moderate volume hiatal hernia. Hepatobiliary: No focal liver abnormality. No gallstones, gallbladder wall thickening, or  pericholecystic fluid. No biliary dilatation. Pancreas: No focal lesion. Normal pancreatic contour. No surrounding inflammatory changes. No main pancreatic ductal dilatation. Spleen: Normal in size.  Nonspecific vague hypodensity. Adrenals/Urinary Tract: No adrenal nodule bilaterally. Atrophic left kidney. Renal cortical scarring and thinning of the left kidney. No nephrolithiasis and no hydronephrosis. Bilateral fluid density lesions likely represent simple renal cysts. Simple renal cysts, in the absence of clinically indicated signs/symptoms, require no independent follow-up. No ureterolithiasis or hydroureter. The urinary bladder is unremarkable. Stomach/Bowel: Stomach is within normal limits. No evidence of bowel wall thickening or dilatation. Colonic diverticulosis. The appendix is not definitely identified with no inflammatory changes in the right lower quadrant to suggest acute appendicitis. Vascular/Lymphatic: No abdominal aorta or iliac aneurysm. Severe atherosclerotic plaque of the aorta and its branches. No abdominal, pelvic, or inguinal lymphadenopathy. Reproductive: Status post hysterectomy. No adnexal masses. Other: No intraperitoneal free fluid. No intraperitoneal free gas. No organized fluid collection. Musculoskeletal: No abdominal wall hernia or abnormality. No suspicious lytic or blastic osseous lesions. No acute displaced fracture. Multilevel degenerative changes of the spine. Chronic appearing compression fracture of the T12 on L1 levels. Degenerative changes of the partially visualized left upper extremity. IMPRESSION: 1. Interval increase in size of a moderate volume hiatal hernia. 2. Atrophic left kidney. 3.  Aortic Atherosclerosis (ICD10-I70.0). Electronically Signed   By: Iven Finn M.D.   On: 05/13/2022 18:14   DG Chest Port 1 View  Result Date: 05/13/2022 CLINICAL DATA:  Altered mental status EXAM: PORTABLE CHEST 1 VIEW COMPARISON:  Chest radiograph 03/09/2022 FINDINGS: The  cardiomediastinal silhouette is stable. There is no focal consolidation or pulmonary edema. There is no pleural effusion or pneumothorax There is no acute osseous abnormality. A prior fracture of the proximal left humerus and distal right clavicle are unchanged. IMPRESSION: Stable chest with no radiographic evidence of acute cardiopulmonary process. Electronically Signed   By: Valetta Mole M.D.   On: 05/13/2022 15:41   CT Cervical Spine Wo Contrast  Result Date: 05/13/2022 CLINICAL DATA:  Trauma, altered mental status EXAM: CT CERVICAL SPINE WITHOUT CONTRAST TECHNIQUE: Multidetector CT imaging of the cervical spine was performed without intravenous contrast.  Multiplanar CT image reconstructions were also generated. RADIATION DOSE REDUCTION: This exam was performed according to the departmental dose-optimization program which includes automated exposure control, adjustment of the mA and/or kV according to patient size and/or use of iterative reconstruction technique. COMPARISON:  05/08/2021 FINDINGS: Alignment: Alignment of posterior margins of vertebral bodies is within normal limits. Skull base and vertebrae: No recent fracture is seen. There are smooth marginated calcifications posterior to the spinous processes of C4, C5 and C7 vertebrae with no interval change. Soft tissues and spinal canal: There is extrinsic pressure over the ventral margin of thecal sac caused by posterior bony spurs at C4-C5 and C5-C6 levels. Disc levels: There is encroachment of neural foramina by bony spurs from C3 to C7 levels. Upper chest: There is ectasia of the aortic arch measuring 4 cm in maximum diameter. Extensive calcifications are seen in thoracic aorta and its major branches. Small amount of debris is seen in the dependent portion of the lumen of trachea. Other: Thyroid is smaller than usual with inhomogeneous attenuation. IMPRESSION: No recent fracture is seen in cervical spine. Cervical spondylosis with encroachment of  neural foramina from C3-C7 levels. There is debris in the dependent portion of lumen of trachea suggesting possible aspiration. 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 Electronically Signed   By: Elmer Picker M.D.   On: 05/13/2022 14:51   CT Head Wo Contrast  Result Date: 05/13/2022 CLINICAL DATA:  Altered mental status EXAM: CT HEAD WITHOUT CONTRAST TECHNIQUE: Contiguous axial images were obtained from the base of the skull through the vertex without intravenous contrast. RADIATION DOSE REDUCTION: This exam was performed according to the departmental dose-optimization program which includes automated exposure control, adjustment of the mA and/or kV according to patient size and/or use of iterative reconstruction technique. COMPARISON:  03/08/2022 FINDINGS: Brain: No acute intracranial findings are seen. There are no signs of bleeding within the cranium. There is large old infarct in right MCA distribution in right parietal lobe. There is also encephalomalacia in the left cerebellum suggesting old infarct. Minimal calcifications are seen in basal ganglia. No significant interval changes are noted. Vascular: Scattered arterial calcifications are seen. Skull: No acute findings are seen. Sinuses/Orbits: Unremarkable. Other: No significant interval changes are noted. IMPRESSION: No acute intracranial findings are seen. Old infarcts are seen in right cerebral and left cerebellar hemispheres. Atrophy. No significant interval changes are noted. Electronically Signed   By: Elmer Picker M.D.   On: 05/13/2022 14:44     The results of significant diagnostics from this hospitalization (including imaging, microbiology, ancillary and laboratory) are listed below for reference.     Microbiology: Recent Results (from the past 240 hour(s))  Blood culture (routine x 2)     Status: None (Preliminary result)   Collection Time: 05/13/22  1:48 PM   Specimen: BLOOD   Result Value Ref Range Status   Specimen Description BLOOD RIGHT ANTECUBITAL  Final   Special Requests   Final    BOTTLES DRAWN AEROBIC AND ANAEROBIC Blood Culture adequate volume   Culture   Final    NO GROWTH 3 DAYS Performed at [facility], [address] [place], [place] [address]    Report Status PENDING  Incomplete  Blood culture (routine x 2)     Status: None (Preliminary result)   Collection Time: 05/13/22  1:48 PM   Specimen: BLOOD  Result Value Ref Range Status   Specimen Description BLOOD BLOOD RIGHT ARM  Final   Special Requests   Final    BOTTLES DRAWN AEROBIC AND ANAEROBIC Blood Culture results may not be optimal due to an inadequate volume of blood received in culture bottles   Culture   Final    NO GROWTH 3 DAYS Performed at [facility], [address], [place], [place] [address]    Report Status PENDING  Incomplete  Resp Panel by RT-PCR (Flu A&B, Covid) Anterior Nasal Swab     Status: None   Collection Time: 05/13/22  1:52 PM   Specimen: Anterior Nasal Swab  Result Value Ref Range Status   SARS Coronavirus 2 by RT PCR NEGATIVE NEGATIVE Final    Comment: (NOTE) SARS-CoV-2 target nucleic acids are NOT DETECTED.  The SARS-CoV-2 RNA is generally detectable in upper respiratory specimens during the acute phase of infection. The lowest concentration of SARS-CoV-2 viral copies this assay can detect is 138 copies/mL. A negative result does not preclude SARS-Cov-2 infection and should not be used as the sole basis for treatment or other patient management decisions. A negative result may occur with  improper specimen collection/handling, submission of specimen other than nasopharyngeal swab, presence of viral mutation(s) within the areas targeted by this assay, and inadequate number of viral copies(<138 copies/mL). A negative result must be combined with clinical observations, patient history, and epidemiological information. The expected  result is Negative.  Fact Sheet for Patients:  EntrepreneurPulse.com.au  Fact Sheet for Healthcare Providers:  IncredibleEmployment.be  This test is no t yet approved or cleared by the [place] FDA and  has been authorized for detection and/or diagnosis of SARS-CoV-2 by FDA under an Emergency Use Authorization (EUA). This EUA will remain  in effect (meaning this test can be used) for the duration of the COVID-19 declaration under Section 564(b)(1) of the Act, 21 U.S.C.section 360bbb-3(b)(1), unless the authorization is terminated  or revoked sooner.       Influenza A by PCR NEGATIVE NEGATIVE Final   Influenza B by PCR NEGATIVE NEGATIVE Final    Comment: (NOTE) The Xpert Xpress SARS-CoV-2/FLU/RSV plus assay is intended as an aid in the diagnosis of influenza from Nasopharyngeal swab specimens and should not be used as a sole basis for treatment. Nasal washings and aspirates are unacceptable for Xpert Xpress SARS-CoV-2/FLU/RSV testing.  Fact Sheet for Patients: EntrepreneurPulse.com.au  Fact Sheet for Healthcare Providers: IncredibleEmployment.be  This test is not yet approved or cleared by the [place] FDA and has been authorized for detection and/or diagnosis of SARS-CoV-2 by FDA under an Emergency Use Authorization (EUA). This EUA will remain in effect (meaning this test can be used) for the duration of the COVID-19 declaration under Section 564(b)(1) of the Act, 21 U.S.C. section 360bbb-3(b)(1), unless the authorization is terminated or revoked.  Performed at [facility], [address] [place], [place] [address]   MRSA Next Gen by PCR, Nasal     Status: Abnormal   Collection Time: 05/14/22 10:09 AM   Specimen: Nasal Mucosa; Nasal Swab  Result Value Ref Range Status   MRSA by PCR Next Gen DETECTED (A) NOT DETECTED Final    Comment: RESULT CALLED TO, READ BACK BY AND VERIFIED  WITH: BRANDY DAVIS RN [id number] 05/14/22 HNM (NOTE) The GeneXpert MRSA Assay (FDA approved for NASAL specimens only), is one component of a comprehensive MRSA colonization surveillance program. It is not intended to diagnose MRSA infection nor to guide or monitor treatment for MRSA infections. Test performance is not FDA approved in patients less  than 59 years old. Performed at [facility], [address] [place], [place] [address]      Labs: BNP (last 3 results) Recent Labs    05/14/22 1335  BNP [id number]*   Basic Metabolic Panel: Recent Labs  Lab 05/13/22 1348 05/14/22 0423 05/15/22 1155 05/16/22 0453  NA 137 146* 139 138  K 4.4 4.0 3.7 3.3*  CL 105 113* 110 108  CO2 21* 23 22 22   GLUCOSE 123* 81 119* 90  BUN 26* 24* 17 14  CREATININE 1.67* 1.26* 0.94 0.79  CALCIUM 9.1 8.0* 8.1* 8.1*  MG  --   --  1.7 1.6*   Liver Function Tests: Recent Labs  Lab 05/13/22 1348  AST 23  ALT 14  ALKPHOS 85  BILITOT 0.8  PROT 6.7  ALBUMIN 3.6   Recent Labs  Lab 05/14/22 1009  LIPASE 31   Recent Labs  Lab 05/14/22 1335  AMMONIA 16   CBC: Recent Labs  Lab 05/13/22 1348 05/14/22 0423 05/15/22 1155 05/16/22 0453  WBC 14.8* 7.6 8.5 9.2  HGB 12.7 10.3* 10.9* 11.3*  HCT 40.5 33.7* 34.6* 34.6*  MCV 93.3 95.5 93.0 89.9  PLT 237 187 200 213   Cardiac Enzymes: No results for input(s): "CKTOTAL", "CKMB", "CKMBINDEX", "TROPONINI" in the last 168 hours. BNP: Invalid input(s): "POCBNP" CBG: Recent Labs  Lab 05/15/22 1224 05/15/22 1609 05/15/22 2324 05/16/22 0517 05/16/22 0847  GLUCAP 119* 100* 91 90 84   D-Dimer No results for input(s): "DDIMER" in the last 72 hours. Hgb A1c No results for input(s): "HGBA1C" in the last 72 hours. Lipid Profile No results for input(s): "CHOL", "HDL", "LDLCALC", "TRIG", "CHOLHDL", "LDLDIRECT" in the last 72 hours. Thyroid function studies Recent Labs    05/14/22 1009  TSH 1.665   Anemia work up Recent Labs     05/14/22 1009  VITAMINB12 322   Urinalysis    Component Value Date/Time   COLORURINE AMBER (A) 05/13/2022 1353   APPEARANCEUR CLOUDY (A) 05/13/2022 1353   LABSPEC 1.020 05/13/2022 1353   PHURINE 5.0 05/13/2022 1353   GLUCOSEU NEGATIVE 05/13/2022 1353   HGBUR NEGATIVE 05/13/2022 1353   BILIRUBINUR NEGATIVE 05/13/2022 1353   KETONESUR NEGATIVE 05/13/2022 1353   PROTEINUR 100 (A) 05/13/2022 1353   NITRITE NEGATIVE 05/13/2022 1353   LEUKOCYTESUR NEGATIVE 05/13/2022 1353   Sepsis Labs Recent Labs  Lab 05/13/22 1348 05/14/22 0423 05/15/22 1155 05/16/22 0453  WBC 14.8* 7.6 8.5 9.2   Microbiology Recent Results (from the past 240 hour(s))  Blood culture (routine x 2)     Status: None (Preliminary result)   Collection Time: 05/13/22  1:48 PM   Specimen: BLOOD  Result Value Ref Range Status   Specimen Description BLOOD RIGHT ANTECUBITAL  Final   Special Requests   Final    BOTTLES DRAWN AEROBIC AND ANAEROBIC Blood Culture adequate volume   Culture   Final    NO GROWTH 3 DAYS Performed at [facility], [address], [place], [place] [address]    Report Status PENDING  Incomplete  Blood culture (routine x 2)     Status: None (Preliminary result)   Collection Time: 05/13/22  1:48 PM   Specimen: BLOOD  Result Value Ref Range Status   Specimen Description BLOOD BLOOD RIGHT ARM  Final   Special Requests   Final    BOTTLES DRAWN AEROBIC AND ANAEROBIC Blood Culture results may not be optimal due to an inadequate volume of blood received in culture bottles  Culture   Final    NO GROWTH 3 DAYS Performed at [facility], [address] [place], [place] [address]    Report Status PENDING  Incomplete  Resp Panel by RT-PCR (Flu A&B, Covid) Anterior Nasal Swab     Status: None   Collection Time: 05/13/22  1:52 PM   Specimen: Anterior Nasal Swab  Result Value Ref Range Status   SARS Coronavirus 2 by RT PCR NEGATIVE NEGATIVE Final    Comment: (NOTE) SARS-CoV-2  target nucleic acids are NOT DETECTED.  The SARS-CoV-2 RNA is generally detectable in upper respiratory specimens during the acute phase of infection. The lowest concentration of SARS-CoV-2 viral copies this assay can detect is 138 copies/mL. A negative result does not preclude SARS-Cov-2 infection and should not be used as the sole basis for treatment or other patient management decisions. A negative result may occur with  improper specimen collection/handling, submission of specimen other than nasopharyngeal swab, presence of viral mutation(s) within the areas targeted by this assay, and inadequate number of viral copies(<138 copies/mL). A negative result must be combined with clinical observations, patient history, and epidemiological information. The expected result is Negative.  Fact Sheet for Patients:  EntrepreneurPulse.com.au  Fact Sheet for Healthcare Providers:  IncredibleEmployment.be  This test is no t yet approved or cleared by the [place] FDA and  has been authorized for detection and/or diagnosis of SARS-CoV-2 by FDA under an Emergency Use Authorization (EUA). This EUA will remain  in effect (meaning this test can be used) for the duration of the COVID-19 declaration under Section 564(b)(1) of the Act, 21 U.S.C.section 360bbb-3(b)(1), unless the authorization is terminated  or revoked sooner.       Influenza A by PCR NEGATIVE NEGATIVE Final   Influenza B by PCR NEGATIVE NEGATIVE Final    Comment: (NOTE) The Xpert Xpress SARS-CoV-2/FLU/RSV plus assay is intended as an aid in the diagnosis of influenza from Nasopharyngeal swab specimens and should not be used as a sole basis for treatment. Nasal washings and aspirates are unacceptable for Xpert Xpress SARS-CoV-2/FLU/RSV testing.  Fact Sheet for Patients: EntrepreneurPulse.com.au  Fact Sheet for Healthcare  Providers: IncredibleEmployment.be  This test is not yet approved or cleared by the [place] FDA and has been authorized for detection and/or diagnosis of SARS-CoV-2 by FDA under an Emergency Use Authorization (EUA). This EUA will remain in effect (meaning this test can be used) for the duration of the COVID-19 declaration under Section 564(b)(1) of the Act, 21 U.S.C. section 360bbb-3(b)(1), unless the authorization is terminated or revoked.  Performed at [facility], [address] [place], [place] [address]   MRSA Next Gen by PCR, Nasal     Status: Abnormal   Collection Time: 05/14/22 10:09 AM   Specimen: Nasal Mucosa; Nasal Swab  Result Value Ref Range Status   MRSA by PCR Next Gen DETECTED (A) NOT DETECTED Final    Comment: RESULT CALLED TO, READ BACK BY AND VERIFIED WITH: BRANDY DAVIS RN [id number] 05/14/22 HNM (NOTE) The GeneXpert MRSA Assay (FDA approved for NASAL specimens only), is one component of a comprehensive MRSA colonization surveillance program. It is not intended to diagnose MRSA infection nor to guide or monitor treatment for MRSA infections. Test performance is not FDA approved in patients less than 85 years old. Performed at [facility], [address] [place],  [address]      Time coordinating discharge:  I have spent 35 minutes face to face with the patient  and on the ward discussing the patients care, assessment, plan and disposition with other care givers. >50% of the time was devoted counseling the patient about the risks and benefits of treatment/Discharge disposition and coordinating care.   SIGNED:   Damita Lack, MD  [facility] 05/16/2022, 12:24 PM   If 7PM-7AM, please contact night-coverage

## 2022-05-16 NOTE — TOC Transition Note (Signed)
Transition of Care [facility]) - CM/SW Discharge Note   Patient Details  Name: Sonya [name] MRN: [id number] Date of Birth: 21-Jun-1932  [facility] CM/SW Contact:  Laurena Slimmer, RN Phone Number: 05/16/2022, 11:57 AM   Clinical Narrative:     Per facility patient can be accepted today Spoke with Tiffany  in admissions Patient assigned room # 309 B Nurse will call report to [phone] MD, nurse, and family notified.  Face sheet and medical necessity forms printed to the floor to be added to the EMS pack EMS arranged  Discharge summary and SNF tranfer report sent in [place].  TOC signing off.    Final next level of care: [place] Barriers to Discharge: Barriers Resolved   Patient Goals and CMS Choice      Discharge Placement                Patient chooses bed at: [facility] Patient to be transferred to facility by: [facility] Name of family member notified: Annabell Howells, Grandson Patient and family notified of of transfer: 05/16/22  Discharge Plan and Services Additional resources added to the After Visit Summary for                                       Social Determinants of Health (SDOH) Interventions SDOH Screenings   Tobacco Use: Medium Risk (05/14/2022)     Readmission Risk Interventions     No data to display

## 2022-05-16 NOTE — Progress Notes (Signed)
Report called to [facility] 

## 2022-05-16 NOTE — Care Management Important Message (Signed)
Important Message  Patient Details  Name: Sonya [name] MRN: [id number] Date of Birth: 01/19/1933   Medicare Important Message Given:  N/A - LOS <3 / Initial given by admissions     Dannette Barbara 05/16/2022, 8:49 AM
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
## 2022-05-18 LAB — CULTURE, BLOOD (ROUTINE X 2)
Culture: NO GROWTH
Culture: NO GROWTH
Special Requests: ADEQUATE

## 2023-08-18 DEATH — deceased
